# Patient Record
Sex: Female | Born: 1960 | Race: White | Hispanic: No | Marital: Married | Smoking: Never smoker
Health system: Southern US, Community
[De-identification: ages and names within clinical notes are randomized; demographics above are authoritative.]

## PROBLEM LIST (undated history)

## (undated) DIAGNOSIS — E875 Hyperkalemia: Secondary | ICD-10-CM

## (undated) DIAGNOSIS — H269 Unspecified cataract: Secondary | ICD-10-CM

## (undated) DIAGNOSIS — R519 Headache, unspecified: Secondary | ICD-10-CM

## (undated) DIAGNOSIS — E785 Hyperlipidemia, unspecified: Secondary | ICD-10-CM

## (undated) DIAGNOSIS — M7500 Adhesive capsulitis of unspecified shoulder: Secondary | ICD-10-CM

## (undated) DIAGNOSIS — G629 Polyneuropathy, unspecified: Secondary | ICD-10-CM

## (undated) DIAGNOSIS — N183 Chronic kidney disease, stage 3 unspecified: Secondary | ICD-10-CM

## (undated) DIAGNOSIS — G4730 Sleep apnea, unspecified: Secondary | ICD-10-CM

## (undated) DIAGNOSIS — E11319 Type 2 diabetes mellitus with unspecified diabetic retinopathy without macular edema: Secondary | ICD-10-CM

## (undated) DIAGNOSIS — M7502 Adhesive capsulitis of left shoulder: Secondary | ICD-10-CM

## (undated) DIAGNOSIS — S0500XA Injury of conjunctiva and corneal abrasion without foreign body, unspecified eye, initial encounter: Secondary | ICD-10-CM

## (undated) DIAGNOSIS — N189 Chronic kidney disease, unspecified: Secondary | ICD-10-CM

## (undated) DIAGNOSIS — G250 Essential tremor: Secondary | ICD-10-CM

## (undated) DIAGNOSIS — M21371 Foot drop, right foot: Secondary | ICD-10-CM

## (undated) DIAGNOSIS — G709 Myoneural disorder, unspecified: Secondary | ICD-10-CM

## (undated) DIAGNOSIS — M21372 Foot drop, left foot: Secondary | ICD-10-CM

## (undated) DIAGNOSIS — K3184 Gastroparesis: Secondary | ICD-10-CM

## (undated) DIAGNOSIS — F329 Major depressive disorder, single episode, unspecified: Secondary | ICD-10-CM

## (undated) DIAGNOSIS — E079 Disorder of thyroid, unspecified: Secondary | ICD-10-CM

## (undated) DIAGNOSIS — M7501 Adhesive capsulitis of right shoulder: Secondary | ICD-10-CM

## (undated) DIAGNOSIS — E109 Type 1 diabetes mellitus without complications: Secondary | ICD-10-CM

## (undated) DIAGNOSIS — F32A Depression, unspecified: Secondary | ICD-10-CM

## (undated) DIAGNOSIS — M26609 Unspecified temporomandibular joint disorder, unspecified side: Secondary | ICD-10-CM

## (undated) DIAGNOSIS — R58 Hemorrhage, not elsewhere classified: Secondary | ICD-10-CM

## (undated) DIAGNOSIS — J189 Pneumonia, unspecified organism: Secondary | ICD-10-CM

## (undated) DIAGNOSIS — D649 Anemia, unspecified: Secondary | ICD-10-CM

## (undated) DIAGNOSIS — R0982 Postnasal drip: Secondary | ICD-10-CM

## (undated) DIAGNOSIS — R51 Headache: Secondary | ICD-10-CM

## (undated) DIAGNOSIS — E1142 Type 2 diabetes mellitus with diabetic polyneuropathy: Secondary | ICD-10-CM

## (undated) DIAGNOSIS — E119 Type 2 diabetes mellitus without complications: Secondary | ICD-10-CM

## (undated) DIAGNOSIS — K219 Gastro-esophageal reflux disease without esophagitis: Secondary | ICD-10-CM

## (undated) DIAGNOSIS — E7800 Pure hypercholesterolemia, unspecified: Secondary | ICD-10-CM

## (undated) DIAGNOSIS — S92902A Unspecified fracture of left foot, initial encounter for closed fracture: Secondary | ICD-10-CM

## (undated) HISTORY — DX: Foot drop, left foot: M21.372

## (undated) HISTORY — DX: Unspecified cataract: H26.9

## (undated) HISTORY — DX: Anemia, unspecified: D64.9

## (undated) HISTORY — DX: Gastroparesis: K31.84

## (undated) HISTORY — DX: Type 2 diabetes mellitus with unspecified diabetic retinopathy without macular edema: E11.319

## (undated) HISTORY — PX: APPENDECTOMY: SHX54

## (undated) HISTORY — DX: Major depressive disorder, single episode, unspecified: F32.9

## (undated) HISTORY — DX: Hemorrhage, not elsewhere classified: R58

## (undated) HISTORY — DX: Unspecified fracture of left foot, initial encounter for closed fracture: S92.902A

## (undated) HISTORY — DX: Sleep apnea, unspecified: G47.30

## (undated) HISTORY — DX: Foot drop, right foot: M21.371

## (undated) HISTORY — DX: Depression, unspecified: F32.A

## (undated) HISTORY — DX: Type 2 diabetes mellitus with diabetic polyneuropathy: E11.42

## (undated) HISTORY — DX: Type 2 diabetes mellitus without complications: E11.9

## (undated) HISTORY — DX: Adhesive capsulitis of unspecified shoulder: M75.00

## (undated) HISTORY — DX: Hyperlipidemia, unspecified: E78.5

## (undated) HISTORY — DX: Hyperkalemia: E87.5

## (undated) HISTORY — DX: Adhesive capsulitis of right shoulder: M75.01

## (undated) HISTORY — DX: Gastro-esophageal reflux disease without esophagitis: K21.9

## (undated) HISTORY — DX: Disorder of thyroid, unspecified: E07.9

## (undated) HISTORY — DX: Postnasal drip: R09.82

## (undated) HISTORY — DX: Chronic kidney disease, stage 3 (moderate): N18.3

## (undated) HISTORY — DX: Injury of conjunctiva and corneal abrasion without foreign body, unspecified eye, initial encounter: S05.00XA

## (undated) HISTORY — DX: Unspecified temporomandibular joint disorder, unspecified side: M26.609

## (undated) HISTORY — DX: Chronic kidney disease, stage 3 unspecified: N18.30

## (undated) HISTORY — DX: Essential tremor: G25.0

## (undated) HISTORY — DX: Adhesive capsulitis of left shoulder: M75.02

## (undated) HISTORY — DX: Pure hypercholesterolemia, unspecified: E78.00

## (undated) HISTORY — DX: Polyneuropathy, unspecified: G62.9

## (undated) HISTORY — DX: Type 1 diabetes mellitus without complications: E10.9

## (undated) HISTORY — DX: Chronic kidney disease, unspecified: N18.9

---
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
## 1988-11-29 HISTORY — PX: CARPAL TUNNEL RELEASE: SHX101

## 2005-11-29 DIAGNOSIS — M21371 Foot drop, right foot: Secondary | ICD-10-CM

## 2005-11-29 HISTORY — DX: Foot drop, right foot: M21.371

## 2005-11-29 HISTORY — PX: OTHER SURGICAL HISTORY: SHX169

## 2005-11-29 HISTORY — PX: ROOT CANAL: SHX2363

## 2005-11-29 HISTORY — PX: ABDOMINAL HYSTERECTOMY: SHX81

## 2005-11-29 HISTORY — PX: COLONOSCOPY: SHX174

## 2006-11-29 HISTORY — PX: OTHER SURGICAL HISTORY: SHX169

## 2006-11-29 HISTORY — PX: CATARACT EXTRACTION: SUR2

## 2007-11-30 DIAGNOSIS — K3184 Gastroparesis: Secondary | ICD-10-CM

## 2007-11-30 HISTORY — DX: Gastroparesis: K31.84

## 2008-11-29 DIAGNOSIS — R0982 Postnasal drip: Secondary | ICD-10-CM

## 2008-11-29 DIAGNOSIS — E7800 Pure hypercholesterolemia, unspecified: Secondary | ICD-10-CM

## 2008-11-29 DIAGNOSIS — E11319 Type 2 diabetes mellitus with unspecified diabetic retinopathy without macular edema: Secondary | ICD-10-CM

## 2008-11-29 HISTORY — DX: Postnasal drip: R09.82

## 2008-11-29 HISTORY — PX: ROOT CANAL: SHX2363

## 2008-11-29 HISTORY — DX: Pure hypercholesterolemia, unspecified: E78.00

## 2008-11-29 HISTORY — DX: Type 2 diabetes mellitus with unspecified diabetic retinopathy without macular edema: E11.319

## 2009-11-29 DIAGNOSIS — R58 Hemorrhage, not elsewhere classified: Secondary | ICD-10-CM

## 2009-11-29 DIAGNOSIS — G250 Essential tremor: Secondary | ICD-10-CM

## 2009-11-29 HISTORY — DX: Essential tremor: G25.0

## 2009-11-29 HISTORY — DX: Hemorrhage, not elsewhere classified: R58

## 2010-11-29 DIAGNOSIS — E875 Hyperkalemia: Secondary | ICD-10-CM

## 2010-11-29 HISTORY — DX: Hyperkalemia: E87.5

## 2010-11-29 HISTORY — PX: EYE SURGERY: SHX253

## 2011-11-30 DIAGNOSIS — M7500 Adhesive capsulitis of unspecified shoulder: Secondary | ICD-10-CM

## 2011-11-30 DIAGNOSIS — M7501 Adhesive capsulitis of right shoulder: Secondary | ICD-10-CM

## 2011-11-30 DIAGNOSIS — S0500XA Injury of conjunctiva and corneal abrasion without foreign body, unspecified eye, initial encounter: Secondary | ICD-10-CM

## 2011-11-30 HISTORY — DX: Adhesive capsulitis of unspecified shoulder: M75.00

## 2011-11-30 HISTORY — DX: Adhesive capsulitis of right shoulder: M75.01

## 2011-11-30 HISTORY — DX: Injury of conjunctiva and corneal abrasion without foreign body, unspecified eye, initial encounter: S05.00XA

## 2014-07-15 ENCOUNTER — Encounter: Payer: Managed Care, Other (non HMO) | Attending: Internal Medicine | Admitting: *Deleted

## 2014-07-15 DIAGNOSIS — Z794 Long term (current) use of insulin: Secondary | ICD-10-CM | POA: Insufficient documentation

## 2014-07-15 DIAGNOSIS — E1065 Type 1 diabetes mellitus with hyperglycemia: Secondary | ICD-10-CM | POA: Insufficient documentation

## 2014-07-15 DIAGNOSIS — IMO0002 Reserved for concepts with insufficient information to code with codable children: Secondary | ICD-10-CM | POA: Diagnosis present

## 2014-07-15 DIAGNOSIS — Z4681 Encounter for fitting and adjustment of insulin pump: Secondary | ICD-10-CM | POA: Diagnosis present

## 2014-07-15 NOTE — Progress Notes (Signed)
Introduction to Insulin Pump Therapy:  Appt start time: 1530 end time:  1700 on 07/15/2014  Assessment:  This patient has DM 1 and their primary concerns today: interested in learning more about pump therapy and Carb Counting review.  This patient is interested in learning more about insulin pump therapy because she wants to improve her control of her DM 1. She is currently already wearing the Dexcom CGM and states she likes it very much.  MEDICATIONS: Basal Insulin: 24 units of Lantus at AM and again in PM via pen  Bolus Insulin: variable units of Humalog at each meal  via pen  Total of insulin doses per day: 90 units Other diabetes medications: no  This patient is  currently adjusting bolus insulin based BG at a correction ratio of 1 unit / 25 mg/dl above 784150 mg/dl This patient is not currently adjusting bolus insulin based on carb intake   Patient states knowledge of Carb Counting is limited  Usual physical activity: none due to disability due to neuropathy, in wheel chair today  Last A1c was 8.5%  Patient states complications from diabetes include neuropathy, gastroparesis, retinopathy and renal failure Patient states their biggest barrier with diabetes is keeping up with everything  Patient currently is not working, she is disabled.  Progress Towards Obtaining an Insulin PumpGoal(s):  In progress.  Patient states their expectations of pump therapy include: better control and easier to manage insulin doses Patient expresses understanding that for improved outcomes for their diabetes on an insulin pump they will:  Check BG 4+ times per day  Change out pump infusion set at least every 3 days  Upload pump information to software on a regular basis so provider can assess patterns and make setting adjustments.     Intervention:    Taught difference between delivery of insulin via syringe/pen compared to insulin pump.  Demonstrated improved insulin delivery via pump due to  improved accuracy of dose and flexibility of adjusting bolus insulin based on carb intake and BG correction.  Demonstrated Animas, Medtronic and T-Slim insulin pumps, insulin reservoir and infusion set options, and button pushing for bolus delivery of insulin through the pump  Explained importance of testing BG at least 4 times per day for appropriate correction of high BG and prevention of DKA as applicable.  Emphasized importance of follow up after Pump Start for appropriate pump setting adjustments and on-going training on more advanced features.  Handouts given during visit include: Patient interested in [place] because there is a partnership between [place] and [facility] already  Insulin Pump Packet from [facility]   Monitoring/Evaluation:    Patient does  want to continue with pursuit of insulin pump.  Patient instructed to go to [facility] to complete learning module on insulin pump and bring Certificate of Completion to next visit  Patient gave me permission to contact [place] Rep to start process of ordering the pump.  Patient to notify this office when pump is ordered so training time can be scheduled.

## 2014-07-20 ENCOUNTER — Encounter: Payer: Self-pay | Admitting: *Deleted

## 2014-09-27 ENCOUNTER — Other Ambulatory Visit: Payer: Self-pay

## 2014-09-27 DIAGNOSIS — Z1231 Encounter for screening mammogram for malignant neoplasm of breast: Secondary | ICD-10-CM

## 2014-10-14 ENCOUNTER — Ambulatory Visit
Admission: RE | Admit: 2014-10-14 | Discharge: 2014-10-14 | Disposition: A | Discharge status: Home / Self Care | Payer: 59 | Source: Ambulatory Visit

## 2014-10-14 DIAGNOSIS — Z1231 Encounter for screening mammogram for malignant neoplasm of breast: Secondary | ICD-10-CM

## 2014-12-04 ENCOUNTER — Encounter: Payer: 59 | Attending: Family Medicine | Admitting: *Deleted

## 2014-12-04 DIAGNOSIS — E1065 Type 1 diabetes mellitus with hyperglycemia: Secondary | ICD-10-CM

## 2014-12-04 DIAGNOSIS — IMO0002 Reserved for concepts with insufficient information to code with codable children: Secondary | ICD-10-CM

## 2014-12-04 DIAGNOSIS — E108 Type 1 diabetes mellitus with unspecified complications: Principal | ICD-10-CM

## 2014-12-05 NOTE — Progress Notes (Signed)
Insulin Pump Start Progress Note:  Patient appointment start time: 1430  End time 1730  Patient here for insulin pump start on Animas Vibe pump and Inset infusion set Orders with pump settings received from MD Patient did not complet any Pre- training  Patient is here with her husband, who was supportive and attentive during the visit. He did complete the pre-training materials prior to the visit.  Reviewed Pump Set Up including  Menu Settings  Bolus with Carb Ratio of 1 unit / 7 grams Carb, Correction Factor of 1 unit / 25 mg/dl  Suspend  Basal with initial Basal Rate of 1.40 units/hour  Reservoir Set Up   Pump Training Checklist completed Used Temp Basal of 6 duration @ 50 % basal due to patient taking half of their long acting insulin yesterday at 9 PM  Patient is to sign up for [employer] and agrees to upload by Friday, 12/06/14 and to provide me with their username and password so I can review progress and allow for pump setting adjustments  Patient successfully completed pump start and instructed to call me if BG drops below 60 mg/dl or goes above 161300 mg/dl or as directed by MD  Follow up plan:   Patient to use pump to bolus for meals and snacks   Patient's husband agrees to upload pump to [employer] and notify me of the Username and Password so I can obtain the reports and make decisions on pump settings.   Follow up appointment in 2 weeks to assess progress and teach advanced features such as Temp Basal and Extended Bolus. Will also plan to review Carb Counting again to reinforce.   Will instruct on Sick Day Rules and prevention of DKA at next visit too.

## 2014-12-12 ENCOUNTER — Encounter: Payer: 59 | Admitting: *Deleted

## 2014-12-12 DIAGNOSIS — E108 Type 1 diabetes mellitus with unspecified complications: Principal | ICD-10-CM

## 2014-12-12 DIAGNOSIS — IMO0002 Reserved for concepts with insufficient information to code with codable children: Secondary | ICD-10-CM

## 2014-12-12 DIAGNOSIS — E1065 Type 1 diabetes mellitus with hyperglycemia: Secondary | ICD-10-CM

## 2014-12-12 NOTE — Progress Notes (Signed)
  Pump Follow Up Progress Note 12/12/14  Orders received from MD giving me permission to make insulin pump adjustments for this patient.  Reviewed Dexcom sensor glucose logs on 12/12/14 via: Dexcom [employer] and found the following:           Hypoglycemia Hyperglycemia Comments  Overnight Period:   YES * see Comments below  Pre-Meal:    Breakfast  YES    Lunch  YES    Supper  YES   Post-Meal: Breakfast  YES    Lunch  YES    Supper  YES   Bedtime:   YES    Comments: Unable to upload pump today, patient's husband has not set up Web Site account yet either, so no pump reports available at this visit.  So I uploaded her Dexcom and reviewed those reports separately. All sensor glucose values were significantly above target with an average of 286 +/- 100 mg/dl.  I reviewed Animas Pump history on the pump and there were no boluses given since starting on the pump on 12/04/14. Patient thought she was completing the bolus but evidently she did not re-enter the total insulin value recommended so she was confirming the default of 0.00 units.  I asked patient and her husband why they did not contact me for any BG above 300 as directed and they stated they thought we would discuss the settings today and they were not clear which BG to call about.   Intervention: I instructed patient how to complete the bolus by inputting the total insulin recommended by the pump and confirming by pushing GO I explained that when BG is above 250 mg/dl there will be a High BG Alert, and that she should continue to next screen to complete the request for correction insulin to be delivered. I clarified that they should notify me by my cell phone if BG is below 70 or above 300 mg/dl in the future. I asked patient to give a correction bolus at every meal and only at snacks if BG is above 250 mg/dl to allow me to assess BG patterns more effectively. I discussed the rationale of obtaining Ketone Strips and suggested Foil Wrapped that  can be carried in her meter case and do not expire as quickly  Pump Settings: Target: 100 mg/dl Date: Current Date: 4/09/811/14/16  No changes made today   Basal Rate: Carb Ratio Sensitivity  Basal Rate: Carb Ratio Sensitivity   MN: 1.40 7 25 MN: 1.40 7 25                                                         Plan: Give insulin as a bolus for food and correction at each meal Give insulin as a bolus between meals if BG is above 250 mg/dl Look into getting Foil Wrapped Ketone Strips that you can carry in your meter case Call me if BG is below 70 or above 300 mg/dl    Follow up: Upload pump to web site within the next 7 days and notify me by text the username and password. Plan follow up visit in 1 month

## 2014-12-12 NOTE — Patient Instructions (Signed)
Plan: Give insulin as a bolus for food and correction at each meal Give insulin as a bolus between meals if BG is above 250 mg/dl Look into getting Foil Wrapped Ketone Strips that you can carry in your meter case Call me if BG is below 70 or above 300 mg/dl

## 2015-01-08 ENCOUNTER — Encounter: Payer: 59 | Attending: Family Medicine | Admitting: *Deleted

## 2015-01-08 DIAGNOSIS — Z713 Dietary counseling and surveillance: Secondary | ICD-10-CM | POA: Diagnosis not present

## 2015-01-08 DIAGNOSIS — E108 Type 1 diabetes mellitus with unspecified complications: Secondary | ICD-10-CM | POA: Diagnosis present

## 2015-01-08 DIAGNOSIS — E1065 Type 1 diabetes mellitus with hyperglycemia: Secondary | ICD-10-CM | POA: Diagnosis not present

## 2015-01-08 DIAGNOSIS — IMO0002 Reserved for concepts with insufficient information to code with codable children: Secondary | ICD-10-CM

## 2015-01-08 DIAGNOSIS — Z794 Long term (current) use of insulin: Secondary | ICD-10-CM | POA: Diagnosis not present

## 2015-01-08 NOTE — Progress Notes (Signed)
  Pump Follow Up Progress Note 01/08/15   Start Time: 1600 End time: 1700  Orders received from MD giving me permission to make insulin pump adjustments for this patient.  Reviewed Animas glucose logs on 01/08/15 via: [employer] and found the following:                   Hypoglycemia Hyperglycemia Comments  Overnight Period:      Pre-Meal:    Breakfast      Lunch      Supper     Post-Meal: Breakfast      Lunch      Supper  YES ICR  Bedtime:   YES ICR   Comments: Average BG for past 2 weeks: 193 +/- 67 mg/dl. She is reporting no hypoglycemia and more stability with her BG's than when on injections. There is hyperglycemia around midnight, after her supper meal, so I plan to increase the ICR at supper time, see below. She enjoys no more shots, and states her pump is easy to use. I answered her questions regarding timing of changing out the pump if the reservoir is not empty. Suggested they notify me when they upload in the future and need assistance with further pump setting adjustments.  Intervention: Increased Carb Ratio at 5 PM per below  Pump Settings: Target: 100 mg/dl Date: Current Date: 1/61/092/10/16   changes in bold print   Basal Rate: Carb Ratio Sensitivity  Basal Rate: Carb Ratio Sensitivity   MN: 1.70 7 25 MN: 1.70 MN: 7 25  8  A: 1.10   8 A: 1.10 5 P: 6 (+)   8 P: 1.70   8 P: 1.70                                         Plan: Give insulin as a bolus for food and correction at each meal Give insulin as a bolus between meals if BG is above 250 mg/dl Look into getting Foil Wrapped Ketone Strips that you can carry in your meter case Call me if BG is below 70 or above 300 mg/dl    Follow up: Plan follow up visit with me PRN Continue to see Dr. Sharl MaKerr every 3 months

## 2015-02-19 ENCOUNTER — Encounter: Payer: Self-pay | Admitting: Neurology

## 2015-02-19 ENCOUNTER — Ambulatory Visit (INDEPENDENT_AMBULATORY_CARE_PROVIDER_SITE_OTHER): Payer: 59 | Admitting: Neurology

## 2015-02-19 VITALS — BP 90/60 | HR 67 | Temp 97.7°F | Ht 64.0 in

## 2015-02-19 DIAGNOSIS — E1143 Type 2 diabetes mellitus with diabetic autonomic (poly)neuropathy: Secondary | ICD-10-CM

## 2015-02-19 DIAGNOSIS — E785 Hyperlipidemia, unspecified: Secondary | ICD-10-CM | POA: Diagnosis not present

## 2015-02-19 DIAGNOSIS — E108 Type 1 diabetes mellitus with unspecified complications: Secondary | ICD-10-CM | POA: Diagnosis not present

## 2015-02-19 DIAGNOSIS — E13319 Other specified diabetes mellitus with unspecified diabetic retinopathy without macular edema: Secondary | ICD-10-CM | POA: Diagnosis not present

## 2015-02-19 DIAGNOSIS — I951 Orthostatic hypotension: Secondary | ICD-10-CM | POA: Diagnosis not present

## 2015-02-19 DIAGNOSIS — M21372 Foot drop, left foot: Secondary | ICD-10-CM

## 2015-02-19 DIAGNOSIS — E1042 Type 1 diabetes mellitus with diabetic polyneuropathy: Secondary | ICD-10-CM

## 2015-02-19 DIAGNOSIS — F329 Major depressive disorder, single episode, unspecified: Secondary | ICD-10-CM | POA: Diagnosis not present

## 2015-02-19 DIAGNOSIS — F32A Depression, unspecified: Secondary | ICD-10-CM

## 2015-02-19 DIAGNOSIS — G250 Essential tremor: Secondary | ICD-10-CM | POA: Diagnosis not present

## 2015-02-19 DIAGNOSIS — G4733 Obstructive sleep apnea (adult) (pediatric): Secondary | ICD-10-CM | POA: Diagnosis not present

## 2015-02-19 DIAGNOSIS — E038 Other specified hypothyroidism: Secondary | ICD-10-CM | POA: Diagnosis not present

## 2015-02-19 DIAGNOSIS — K3184 Gastroparesis: Secondary | ICD-10-CM

## 2015-02-19 DIAGNOSIS — N189 Chronic kidney disease, unspecified: Secondary | ICD-10-CM | POA: Diagnosis not present

## 2015-02-19 DIAGNOSIS — M21371 Foot drop, right foot: Secondary | ICD-10-CM | POA: Diagnosis not present

## 2015-02-19 NOTE — Progress Notes (Signed)
[facility]    Provider:  Dr Lucia Gaskins Referring Provider: Juluis Rainier, MD Primary Care Physician:  Gaye Alken, MD  CC:  Tremor  HPI:  Masae Lukacs is a 54 y.o. female here as a referral from Dr. Zachery Dauer for tremor. She has very complicated PMHx. DM 1 with complications of retinopathy and neuropathy and chronic renal insufficiency and  gastroparesis and frozen shoulder, DKA, orthostatic hypotension,  with insulin pump, neuropathy, bilateral foot drop, thyroid disease, HLD, asthma, depression, MGUS, essential tremor. Sleep apnea does not use cpap  She has had tremors. For 5 years. It is worsening. In the hands. Sometimes they are so bad, husband has to hold her hands and she can't stop them even her whole arm shakes. Progressively, slowly getting worse. No caffeine. No coffee. Primidone helped in the past. Doesn't notice it in the mouth or legs. Has worsening neuropathy and was being followed by a neurologist who did a full evaluation. Does not drink alcohol. tremor interferes with eating and cutting food. Had MRi of the brain a few years ago which was negative. She has worsening memory problems. No FHx neuropathy or neurmuscular disease.   Review of Systems: Patient complains of symptoms per HPI as well as the following symptoms: fatigue, cough, snoring, memory loss, headache, numbness, difficulty swallowing, dizziness, tremor, sleepiness, snoring, depression, toomuch sleep, allergies, diarrhea. Pertinent negatives per HPI. All others negative.   History   Social History  . Marital Status: Married    Spouse Name: Theron Arista  . Number of Children: 2  . Years of Education: Ba   Occupational History  . Not on file.   Social History Main Topics  . Smoking status: Never Smoker   . Smokeless tobacco: Not on file  . Alcohol Use: No  . Drug Use: No  . Sexual Activity: Not on file   Other Topics Concern  . Not on file   Social History Narrative   Lives at  home with husband and daughter.   Caffeine use: 1 soda per day    Family History  Problem Relation Age of Onset  . Parkinsonism Neg Hx     Past Medical History  Diagnosis Date  . Diabetes mellitus without complication   . Neuropathy   . Depression   . Cataract   . Foot drop, bilateral 2007  . Thyroid disease   . Sleep apnea   . Anemia   . Gastroparesis 2009  . Chronic kidney disease   . GERD (gastroesophageal reflux disease)   . TMJ (temporomandibular joint disorder)   . Hyperlipidemia   . Diabetic retinopathy 2010  . Adhesive capsulitis of both shoulders 2013  . Postnasal drip 2010  . Hypercholesterolemia 2010  . Diabetic retinopathy 2010    Left eye, mild  . Essential tremor 2011    Bilateral hands  . Hemorrhage 2011    Laser surgery x2  . High potassium 2012    Normal as of 2014  . Scratched cornea 2013    Left eye  . Adhesive capsulitis 2013    Bilateral shoulders    Past Surgical History  Procedure Laterality Date  . Eye surgery Left 2012    Pars plana viterctomy, membrane peel, endolaser  . Colonoscopy  2007  . Throat dilation  2007  . Root canal  2007  . Thorat dilation  2007  . Root canal  2010  . Throat dilation  2010, 0ct 2011    Sep 2010, Nov 2010, 2014  . Tonsillectomy  1971  . Wisdom tooth extraction  1977  . Carpal tunnel release Right 1990  . Abdominal hysterectomy  2007  . Trigger fingers  2008  . Cataract extraction Bilateral 2008    Current Outpatient Prescriptions  Medication Sig Dispense Refill  . Choline Fenofibrate (FENOFIBRIC ACID) 45 MG CPDR Take 45 mg by mouth 3 (three) times daily.    . cycloSPORINE (RESTASIS) 0.05 % ophthalmic emulsion 1 drop 2 (two) times daily.    Marland Kitchen escitalopram (LEXAPRO) 10 MG tablet Take 10 mg by mouth daily.    Marland Kitchen gabapentin (NEURONTIN) 300 MG capsule Take 300 mg by mouth 3 (three) times daily.    . insulin lispro (HUMALOG) 100 UNIT/ML injection Inject into the skin 3 (three) times daily before meals.     . lamoTRIgine (LAMICTAL) 25 MG tablet Take 25 mg by mouth daily.    . lansoprazole (PREVACID) 30 MG capsule Take 30 mg by mouth 2 (two) times daily before a meal.    . levocetirizine (XYZAL) 5 MG tablet Take 5 mg by mouth 2 (two) times daily.    Marland Kitchen levothyroxine (SYNTHROID, LEVOTHROID) 150 MCG tablet Take 150 mcg by mouth daily before breakfast.    . mometasone (NASONEX) 50 MCG/ACT nasal spray Place 2 sprays into the nose daily.    . montelukast (SINGULAIR) 10 MG tablet Take 10 mg by mouth at bedtime.    Letta Pate DELICA LANCETS 33G MISC     . ONETOUCH VERIO test strip     . rosuvastatin (CRESTOR) 20 MG tablet Take 20 mg by mouth daily.    Marland Kitchen estradiol (VIVELLE-DOT) 0.05 MG/24HR patch Place 1 patch onto the skin 2 (two) times a week.    . insulin glargine (LANTUS) 100 UNIT/ML injection Inject 24 Units into the skin 2 (two) times daily.    . primidone (MYSOLINE) 50 MG tablet Take 1.5 tablets (75 mg total) by mouth 3 (three) times daily. 135 tablet 6   No current facility-administered medications for this visit.    Allergies as of 02/19/2015 - Review Complete 02/19/2015  Allergen Reaction Noted  . Codeine  02/19/2015  . Food  07/15/2014  . Penicillins  02/19/2015    Vitals: BP 90/60 mmHg  Pulse 67  Temp(Src) 97.7 F (36.5 C)  Ht  (1.626 m) Last Weight:  Wt Readings from Last 1 Encounters:  No data found for Wt   Last Height:   Ht Readings from Last 1 Encounters:  02/19/15  (1.626 m)   Attempted to document weight. Patient could not stand independently and unknown wheelchair weight.  Physical exam: Exam: Gen: NAD, conversant, well nourised, overweight, well groomed                     CV: RRR, no MRG. No Carotid Bruits. No peripheral edema, warm, nontender Eyes: Conjunctivae clear without exudates or hemorrhage  Neuro: Detailed Neurologic Exam  Speech:    Speech is normal; fluent and spontaneous with normal comprehension.  Cognition:    The patient is  oriented to person, place, and time;     recent and remote memory impaired;     language fluent;     normal attention, concentration,     fund of knowledge grossly intact Cranial Nerves:    The pupils are equal, round, and reactive to light. The fundi are normal and spontaneous venous pulsations are present. Visual fields are full to finger confrontation. Extraocular movements are intact. Trigeminal sensation is intact and  the muscles of mastication are normal. The face is symmetric. The palate elevates in the midline. Hearing intact. Voice is normal. Shoulder shrug is normal. The tongue has normal motion without fasciculations.   Coordination:    FTN without dysmetria. Can't do HTS secondary to weakness.  Gait:    Attempted, able to do briefly. patient with wide-based gait  Motor Observation:    Postural and intention tremor Tone:    Normal muscle tone.    Posture:    Posture is normal. normal erect    Strength: Weakness throughtout 3/5 right arm, left 4+/5 2+/5 lowers with inability to DF feet     Sensation: intact to LT     Reflex Exam:  DTR's:    Absent achilles Toes:    The toes are downgoing bilaterally.   Clonus:    Clonus is absent.       Assessment/Plan:    Roxana HiresMary Hedges is a 54 y.o. female here as a referral from Dr. Zachery DauerBarnes for tremor. She has very complicated PMHx. DM 1 with complications of retinopathy and neuropathy and chronic renal insufficiency and  gastroparesis and frozen shoulder, DKA, orthostatic hypotension,  with insulin pump, neuropathy with bilateral foot drop, thyroid disease, HLD, asthma, depression, MGUS, essential tremor. p/w worsening essential tremor relieved by Primidone in the past. On 50mg  tid for many years.  - Will request lab records from primary care, records requested - Can increase primidone if renal f(x) appropriate - for sleep apnea, encouraged f/u and compliance with cpap. Can cause h/a,cognitive changes, increased risk of  stroke   Addendum: Received records. Creatinine 1.35 patient approx 180 lbs so cr clearance appropriate to increase primidone up to tid. Can increase to 75mg  tid. Called and spoke to patient the next day after received labs and called in prescription.    Naomie DeanAntonia Ahern, MD  [facility] [address] [place], [place] [id number]  Phone [phone] Fax [phone]

## 2015-02-19 NOTE — Patient Instructions (Signed)
Overall you are doing fairly well but I do want to suggest a few things today:   Remember to drink plenty of fluid, eat healthy meals and do not skip any meals. Try to eat protein with a every meal and eat a healthy snack such as fruit or nuts in between meals. Try to keep a regular sleep-wake schedule and try to exercise daily, particularly in the form of walking, 20-30 minutes a day, if you can.   As far as your medications are concerned, I would like to suggest: Suggest increasing the primidone  Will Contact Dr. Louis MeckelStovall I would like to see you back in 6 months, sooner if we need to. Please call us with any interim questions, concerns, problems, updates or refill requests.   Please also call us for any test results so we can go over those with you on the phone.  My clinical assistant and will answer any of your questions and relay your messages to me and also relay most of my messages to you.   Our phone number is [phone]. We also have an after hours call service for urgent matters and there is a physician on-call for urgent questions. For any emergencies you know to call 911 or go to the nearest emergency room

## 2015-02-20 ENCOUNTER — Telehealth: Payer: Self-pay | Admitting: Neurology

## 2015-02-20 ENCOUNTER — Telehealth: Payer: Self-pay | Admitting: *Deleted

## 2015-02-20 MED ORDER — PRIMIDONE 50 MG PO TABS: 75.0000 mg | ORAL_TABLET | Freq: Three times a day (TID) | ORAL | Status: DC

## 2015-02-20 NOTE — Telephone Encounter (Signed)
[name] - Can you call patient back and let her know that it is ok to increase her Primidone 50mg  to 1.5 tablets three times daily. Discussed sedation with patient yesterday, stop for any concerning side effect such as nausea, fatigue, drowsiness, rash, irritability.

## 2015-02-20 NOTE — Telephone Encounter (Signed)
Lab receive on 02/20/15.

## 2015-02-21 ENCOUNTER — Telehealth: Payer: Self-pay | Admitting: *Deleted

## 2015-02-21 NOTE — Telephone Encounter (Signed)
Talked with pt and she will call me back with the fax number for her pharmacy.

## 2015-02-21 NOTE — Telephone Encounter (Signed)
Talked with pt and she was unable to get fax number to [facility] because they said they do not give that to pt and our office will have to call. I told her I will figure out whats going on.

## 2015-02-21 NOTE — Telephone Encounter (Signed)
Talked with pt and received pharmacy information. She uses [facility]. Gave me the number [phone].

## 2015-02-24 ENCOUNTER — Telehealth: Payer: Self-pay | Admitting: *Deleted

## 2015-02-24 NOTE — Telephone Encounter (Signed)
Faxed prescription for primidone 50mg  tablet to [facility] pharmacy on 02/24/15 at 10:08am.

## 2015-02-27 ENCOUNTER — Encounter: Payer: Self-pay | Admitting: Neurology

## 2015-03-01 ENCOUNTER — Encounter: Payer: Self-pay | Admitting: Neurology

## 2015-03-01 DIAGNOSIS — M21372 Foot drop, left foot: Secondary | ICD-10-CM | POA: Insufficient documentation

## 2015-03-01 DIAGNOSIS — E11319 Type 2 diabetes mellitus with unspecified diabetic retinopathy without macular edema: Secondary | ICD-10-CM | POA: Insufficient documentation

## 2015-03-01 DIAGNOSIS — E1140 Type 2 diabetes mellitus with diabetic neuropathy, unspecified: Secondary | ICD-10-CM | POA: Insufficient documentation

## 2015-03-01 DIAGNOSIS — E108 Type 1 diabetes mellitus with unspecified complications: Secondary | ICD-10-CM | POA: Insufficient documentation

## 2015-03-01 DIAGNOSIS — F32A Depression, unspecified: Secondary | ICD-10-CM | POA: Insufficient documentation

## 2015-03-01 DIAGNOSIS — K3184 Gastroparesis: Secondary | ICD-10-CM

## 2015-03-01 DIAGNOSIS — E1010 Type 1 diabetes mellitus with ketoacidosis without coma: Secondary | ICD-10-CM | POA: Insufficient documentation

## 2015-03-01 DIAGNOSIS — F329 Major depressive disorder, single episode, unspecified: Secondary | ICD-10-CM | POA: Insufficient documentation

## 2015-03-01 DIAGNOSIS — E785 Hyperlipidemia, unspecified: Secondary | ICD-10-CM | POA: Insufficient documentation

## 2015-03-01 DIAGNOSIS — N189 Chronic kidney disease, unspecified: Secondary | ICD-10-CM | POA: Insufficient documentation

## 2015-03-01 DIAGNOSIS — G4733 Obstructive sleep apnea (adult) (pediatric): Secondary | ICD-10-CM | POA: Insufficient documentation

## 2015-03-01 DIAGNOSIS — E039 Hypothyroidism, unspecified: Secondary | ICD-10-CM | POA: Insufficient documentation

## 2015-03-01 DIAGNOSIS — G250 Essential tremor: Secondary | ICD-10-CM | POA: Insufficient documentation

## 2015-03-01 DIAGNOSIS — M21371 Foot drop, right foot: Secondary | ICD-10-CM | POA: Insufficient documentation

## 2015-03-01 DIAGNOSIS — I951 Orthostatic hypotension: Secondary | ICD-10-CM | POA: Insufficient documentation

## 2015-03-01 DIAGNOSIS — E1143 Type 2 diabetes mellitus with diabetic autonomic (poly)neuropathy: Secondary | ICD-10-CM | POA: Insufficient documentation

## 2015-03-11 ENCOUNTER — Telehealth: Payer: Self-pay | Admitting: Neurology

## 2015-03-11 NOTE — Telephone Encounter (Signed)
Patient is calling in regard to Rx primidone 50 mg.  She is taking 1 1/2 tablets 3 times per day. She is asking if she can take 1 extra gabapentin 3 times per day and drop the dosage of primidone to 1 tablet 3 times per day. Please call.

## 2015-03-12 NOTE — Telephone Encounter (Signed)
Her med list states she is taking gabapentin 300mg  tid. If so, adding one tab tid is fine. She can go up to 1200mg  tid, thanks.

## 2015-03-12 NOTE — Telephone Encounter (Signed)
I called back.  Relayed providers message.  Patient verbalized understanding and is agreeable to this plan.  She will call us back if anything further is needed.  

## 2015-05-30 ENCOUNTER — Telehealth: Payer: Self-pay | Admitting: Neurology

## 2015-05-30 NOTE — Telephone Encounter (Signed)
Pt called and needs refill for lamoTRIgine (LAMICTAL) 25 MG tablet. Please call and advise [phone]. Also wants to know about a different medication for her tremors. [facility] phone number [phone],

## 2015-05-30 NOTE — Telephone Encounter (Signed)
I called back and spoke with the patient.  She would like to know if Dr Lucia GaskinsAhern would be agreeable to start prescribing Lamictal 150mg  one twice daily.  As well, says the increase in Gabapentin has helped her pain, but feels tremor is getting progressively worse.  She is taking Primidone 75mg  three times daily.  Patient would like to know if an additional mediation could be added for tremor.  Requests message be sent to Dr Lucia GaskinsAhern.  Patient is aware she is not available until next week, says she still has plenty of medication on hand at this time, and prefers to wait for her return.     Please advise.  Thank you!

## 2015-06-04 NOTE — Telephone Encounter (Signed)
Patient just returned Dr. Trevor MaceAhern's call. Please call back [phone].

## 2015-06-04 NOTE — Telephone Encounter (Signed)
Called patient, left message. Will try again.

## 2015-06-05 ENCOUNTER — Telehealth: Payer: Self-pay | Admitting: Neurology

## 2015-06-05 NOTE — Telephone Encounter (Signed)
I called back.  Patient says she would like a Rx for Lamictal 150mg  BID and also wants to discuss taking another medication in addition to Primidone for tremors.  Call back number is [phone].  Please advise.  Thank you!  (I will be happy to call her back)

## 2015-06-05 NOTE — Telephone Encounter (Signed)
Patient called requesting refill for lamoTRIgine (LAMICTAL) 25 MG tablet 30 day supply [facility]. She also needs to discuss a different medication for tremors. Please call and advise. Patient can be reached at [phone].

## 2015-06-06 ENCOUNTER — Other Ambulatory Visit: Payer: Self-pay | Admitting: Neurology

## 2015-06-06 DIAGNOSIS — G250 Essential tremor: Secondary | ICD-10-CM

## 2015-06-06 MED ORDER — LAMOTRIGINE 150 MG PO TABS: 150.0000 mg | ORAL_TABLET | Freq: Every day | ORAL | Status: DC

## 2015-06-06 MED ORDER — GABAPENTIN 300 MG PO CAPS: 900.0000 mg | ORAL_CAPSULE | Freq: Three times a day (TID) | ORAL | Status: DC

## 2015-06-06 NOTE — Telephone Encounter (Signed)
Done, thanks

## 2015-06-24 ENCOUNTER — Telehealth: Payer: Self-pay | Admitting: Neurology

## 2015-06-24 ENCOUNTER — Other Ambulatory Visit: Payer: Self-pay | Admitting: Neurology

## 2015-06-24 MED ORDER — LAMOTRIGINE 150 MG PO TABS: 150.0000 mg | ORAL_TABLET | Freq: Two times a day (BID) | ORAL | Status: DC

## 2015-06-24 NOTE — Telephone Encounter (Signed)
Pt wanted to know if Dr. Lucia Gaskins can fix her Rx for Lamictal. When she previously called she wanted her to call in Rx for 1 tablet 2 times per day. Instead it was sent in as 1 tablet, once daily. She would like a new Rx for 1 tablet, twice daily, so she can take one in the morning and one at night. I told her I will talk with Dr. Lucia Gaskins and call her back. Pt verbalized understanding.

## 2015-06-24 NOTE — Telephone Encounter (Signed)
Patient called inquiring about lamoTRIgine (LAMICTAL) 150 MG tablet . She has a question about the doseage. Please call and advise. Patient can be reached at [phone].

## 2015-06-24 NOTE — Telephone Encounter (Signed)
Let her know I sent it in. thanks

## 2015-06-25 NOTE — Telephone Encounter (Signed)
The patient called back and I advised her the Rx for lamoTRIgine (LAMICTAL) 150 MG tablet was sent. Patient states she did not need a returned call.

## 2015-06-25 NOTE — Telephone Encounter (Signed)
Left VM for pt to call back. Gave [facility] phone number and told her we are open until 5pm.  

## 2015-08-18 ENCOUNTER — Telehealth: Payer: Self-pay | Admitting: *Deleted

## 2015-08-18 NOTE — Telephone Encounter (Signed)
LVM for pt to call back to r/s appt for tomorrow d/t Dr. Lucia Gaskins having emergency and will not be in the office. Gave [facility] phone number and office hours. Advised that Dr. Lucia Gaskins and I are not in the office on Friday's.

## 2015-08-19 ENCOUNTER — Telehealth: Payer: Self-pay | Admitting: *Deleted

## 2015-08-19 ENCOUNTER — Ambulatory Visit: Payer: 59 | Admitting: Neurology

## 2015-08-19 NOTE — Telephone Encounter (Signed)
Spoke w/ [name] and rescheduled appt for today to tomorrow at 1:15pm. Told her to check in at 1:00pm. Told her Dr. Lucia Gaskins had an emergency today and is not in the office. Apologized for any inconvenience. [name] understanding and verbalized understanding.

## 2015-08-20 ENCOUNTER — Ambulatory Visit (INDEPENDENT_AMBULATORY_CARE_PROVIDER_SITE_OTHER): Payer: 59 | Admitting: Neurology

## 2015-08-20 VITALS — BP 99/64 | HR 58 | Temp 97.1°F | Ht 64.0 in

## 2015-08-20 DIAGNOSIS — R413 Other amnesia: Secondary | ICD-10-CM | POA: Diagnosis not present

## 2015-08-20 DIAGNOSIS — Z7409 Other reduced mobility: Secondary | ICD-10-CM

## 2015-08-20 DIAGNOSIS — G250 Essential tremor: Secondary | ICD-10-CM | POA: Diagnosis not present

## 2015-08-20 MED ORDER — LIDOCAINE 0.5 % EX GEL: 1.0000 "application " | CUTANEOUS | Status: DC

## 2015-08-20 NOTE — Progress Notes (Addendum)
[facility]    [name]: Dr Lucia Gaskins Referring Lowery Paullin: Juluis Rainier, MD Primary Care Physician: Gaye Alken, MD  CC: Tremor  Interval update; Tremor is improved by the primidone and the gabapentin. But the tremor is still impairing her ability to write and her ability to ability to text. She is on neurontin and the primidone. Recent increase in primidone didn't help. And the primidone is making her tired. Her blood pressure is always low and this is likely why she hasn't tried propranolol. She is having memory problems. She is having word-finding difficulties.   HPI: [name] is a 54 y.o. female here as a referral from Dr. Zachery Dauer for tremor. She has very complicated PMHx. DM 1 with complications of retinopathy and neuropathy and chronic renal insufficiency and gastroparesis and frozen shoulder, DKA, orthostatic hypotension, with insulin pump, neuropathy, bilateral foot drop, thyroid disease, HLD, asthma, depression, MGUS, essential tremor. Sleep apnea does not use cpap  She has had tremors. For 5 years. It is worsening. In the hands. Sometimes they are so bad, husband has to hold her hands and she can't stop them even her whole arm shakes. Progressively, slowly getting worse. No caffeine. No coffee. Primidone helped in the past. Doesn't notice it in the mouth or legs. Has worsening neuropathy and was being followed by a neurologist who did a full evaluation. Does not drink alcohol. tremor interferes with eating and cutting food. Had MRi of the brain a few years ago which was negative. She has worsening memory problems. No FHx neuropathy or neurmuscular disease.   Review of Systems: Patient complains of symptoms per HPI as well as the following symptoms: fatigue, cough, snoring, memory loss, headache, numbness, difficulty swallowing, dizziness, tremor, sleepiness, snoring, depression, toomuch sleep, allergies, diarrhea. Pertinent negatives per HPI. All  others negative.  Review of Systems: Patient complains of symptoms per HPI as well as the following symptoms: no CP. No SOB. Light sensitivity, cold intolerance, heat intolerance, food allergies, environmental allergies, apnea, snoring, walking difficulty, memory loss, dizziness, speech difficulty, tremors, depression, anxious Pertinent negatives per HPI. All others negative.   Social History   Social History  . Marital Status: Married    Spouse Name: Theron Arista  . Number of Children: 2  . Years of Education: Ba   Occupational History  . Not on file.   Social History Main Topics  . Smoking status: Never Smoker   . Smokeless tobacco: Not on file  . Alcohol Use: No  . Drug Use: No  . Sexual Activity: Not on file   Other Topics Concern  . Not on file   Social History Narrative   Lives at home with husband and daughter.   Caffeine use: 1 soda per day    Family History  Problem Relation Age of Onset  . Parkinsonism Neg Hx   . Neuropathy Neg Hx     Past Medical History  Diagnosis Date  . Diabetes mellitus without complication   . Neuropathy   . Depression   . Cataract   . Foot drop, bilateral 2007  . Thyroid disease   . Sleep apnea   . Anemia   . Gastroparesis 2009  . Chronic kidney disease   . GERD (gastroesophageal reflux disease)   . TMJ (temporomandibular joint disorder)   . Hyperlipidemia   . Diabetic retinopathy 2010  . Adhesive capsulitis of both shoulders 2013  . Postnasal drip 2010  . Hypercholesterolemia 2010  . Diabetic retinopathy 2010    Left eye, mild  .  Essential tremor 2011    Bilateral hands  . Hemorrhage 2011    Laser surgery x2  . High potassium 2012    Normal as of 2014  . Scratched cornea 2013    Left eye  . Adhesive capsulitis 2013    Bilateral shoulders    Past Surgical History  Procedure Laterality Date  . Eye surgery Left 2012    Pars plana viterctomy, membrane peel, endolaser  . Colonoscopy  2007  . Throat dilation  2007  .  Root canal  2007  . Thorat dilation  2007  . Root canal  2010  . Throat dilation  2010, 0ct 2011    Sep 2010, Nov 2010, 2014  . Tonsillectomy  1971  . Wisdom tooth extraction  1977  . Carpal tunnel release Right 1990  . Abdominal hysterectomy  2007  . Trigger fingers  2008  . Cataract extraction Bilateral 2008    Current Outpatient Prescriptions  Medication Sig Dispense Refill  . CREON 36000 UNITS CPEP capsule Take 36,000 Units by mouth daily. Take one huge capsule with each meal or snack    . rosuvastatin (CRESTOR) 20 MG tablet Take 20 mg by mouth daily.    . Choline Fenofibrate (FENOFIBRIC ACID) 45 MG CPDR Take 45 mg by mouth 3 (three) times daily.    . cycloSPORINE (RESTASIS) 0.05 % ophthalmic emulsion 1 drop 2 (two) times daily.    . DULERA 200-5 MCG/ACT AERO Take 1 puff by mouth 2 (two) times daily.    Marland Kitchen escitalopram (LEXAPRO) 10 MG tablet Take 10 mg by mouth daily.    Marland Kitchen estradiol (VIVELLE-DOT) 0.05 MG/24HR patch Place 1 patch onto the skin 2 (two) times a week.    . gabapentin (NEURONTIN) 300 MG capsule Take 3 capsules (900 mg total) by mouth 3 (three) times daily. 810 capsule 3  . insulin glargine (LANTUS) 100 UNIT/ML injection Inject 24 Units into the skin 2 (two) times daily.    . insulin lispro (HUMALOG) 100 UNIT/ML injection Inject into the skin 3 (three) times daily before meals.    . lamoTRIgine (LAMICTAL) 150 MG tablet Take 1 tablet (150 mg total) by mouth 2 (two) times daily. 180 tablet 3  . lansoprazole (PREVACID) 30 MG capsule Take 30 mg by mouth 2 (two) times daily before a meal.    . levocetirizine (XYZAL) 5 MG tablet Take 5 mg by mouth 2 (two) times daily.    Marland Kitchen levothyroxine (SYNTHROID, LEVOTHROID) 150 MCG tablet Take 150 mcg by mouth daily before breakfast.    . mometasone (NASONEX) 50 MCG/ACT nasal spray Place 2 sprays into the nose daily.    . montelukast (SINGULAIR) 10 MG tablet Take 10 mg by mouth at bedtime.    Letta Pate DELICA LANCETS 33G MISC     .  ONETOUCH VERIO test strip     . primidone (MYSOLINE) 50 MG tablet Take 1.5 tablets (75 mg total) by mouth 3 (three) times daily. 135 tablet 6   No current facility-administered medications for this visit.    Allergies as of 08/20/2015 - Review Complete 08/20/2015  Allergen Reaction Noted  . Codeine  02/19/2015  . Food  07/15/2014  . Penicillins  02/19/2015    Vitals: BP 99/64 mmHg  Pulse 58  Temp(Src) 97.1 F (36.2 C) (Oral)  Ht  (1.626 m) Last Weight:  Wt Readings from Last 1 Encounters:  No data found for Wt   Last Height:   Ht Readings from Last 1  Encounters:  08/20/15  (1.626 m)     Physical exam: Exam: Gen: NAD, conversant, well nourised, overweight, well groomed  CV: RRR, no MRG. No Carotid Bruits. No peripheral edema, warm, nontender Eyes: Conjunctivae clear without exudates or hemorrhage  Neuro: Detailed Neurologic Exam  Speech:  Speech is normal; fluent and spontaneous with normal comprehension.  Cognition:  The patient is oriented to person, place, and time; MMSE 28/30.     -1 counting backwards by 7s, -1 recall  Cranial Nerves:  The pupils are equal, round, and reactive to light.  Visual fields are full to finger confrontation. Extraocular movements are intact. Trigeminal sensation is intact and the muscles of mastication are normal. The face is symmetric. The palate elevates in the midline. Hearing intact. Voice is normal. Shoulder shrug is normal. The tongue has normal motion without fasciculations.   Coordination:  FTN without dysmetria. Can't do HTS secondary to weakness.  Gait:  Attempted, able to do briefly. patient with wide-based gait  Motor Observation:  Postural and intention tremor Tone:  Normal muscle tone.   Posture:  Posture is normal. normal erect   Strength: Weakness throughtout 3/5 right arm, left 4+/5 2+/5 lowers with inability to DF feet   Sensation: intact to LT    Reflex Exam:  DTR's:  Absent achilles Toes:  The toes are downgoing bilaterally.  Clonus:  Clonus is absent.      Assessment/Plan: [name] is a 54yo. female here as a referral from Dr. Zachery Dauer for tremor. She has very complicated PMHx. 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 p/w worsening essential tremor relieved by Primidone in the past. On  tid for many years. C/o memory changes today  - essential tremor: she is on primidone and neurontin. Her BP is low so can't try propranolol. Advised we can try different medications or add another but hate to add another due to polypharmacy. She is already having side effects from current meds. Will refer to OT and see if they can help with the tremor with functional items such as weighed silverware.  - perceived memory loss; MMSE normal. Likely secondary to polypharmacy, general medicaal conditions. Reported anxiety/depression - for sleep apnea, encouraged f/u and compliance with cpap. Can cause h/a,cognitive changes, increased risk of stroke   Addendum: Received records. Creatinine 1.35 patient approx 180 lbs so cr clearance appropriate to increase primidone up to tid. Can increase to  tid. Called and spoke to patient the next day after received labs and called in prescription.  Naomie Dean, MD  [facility] [address] [place], [place] [address]  Phone [phone] Fax [phone]  A total of 25 minutes was spent face-to-face with this patient. Over half this time was spent on counseling patient on the essential tremor diagnosis and different diagnostic and therapeutic options available.

## 2015-11-25 ENCOUNTER — Other Ambulatory Visit: Payer: Self-pay

## 2015-11-25 DIAGNOSIS — Z1231 Encounter for screening mammogram for malignant neoplasm of breast: Secondary | ICD-10-CM

## 2015-12-11 ENCOUNTER — Ambulatory Visit
Admission: RE | Admit: 2015-12-11 | Discharge: 2015-12-11 | Disposition: A | Discharge status: Home / Self Care | Payer: Managed Care, Other (non HMO) | Source: Ambulatory Visit

## 2015-12-11 DIAGNOSIS — Z1231 Encounter for screening mammogram for malignant neoplasm of breast: Secondary | ICD-10-CM

## 2016-06-14 ENCOUNTER — Other Ambulatory Visit: Payer: Self-pay | Admitting: Neurology

## 2016-09-14 ENCOUNTER — Other Ambulatory Visit: Payer: Self-pay | Admitting: Neurology

## 2016-09-14 NOTE — Telephone Encounter (Signed)
Dr Lucia GaskinsAhern- your last note stated she can increase to  75mg  TID. Do you want to refill the 50mg  or do the increase in dose?

## 2016-09-14 NOTE — Telephone Encounter (Signed)
If she wants 50mg  just leave it at that then thanks

## 2016-09-14 NOTE — Telephone Encounter (Signed)
This is fine [name], please prescribe thanks

## 2016-09-14 NOTE — Telephone Encounter (Signed)
Dr Ahern- please advise 

## 2016-09-14 NOTE — Telephone Encounter (Signed)
Patient requesting refill of primidone (MYSOLINE) 50 MG tablet w/ years worth Pharmacy:[facility] [place] - [phone]

## 2016-09-15 NOTE — Telephone Encounter (Signed)
Called and LVM for pt to call office back. Need to verify how she is taking primidone.

## 2016-09-16 MED ORDER — PRIMIDONE 50 MG PO TABS
ORAL_TABLET | ORAL | 3 refills | Status: DC
Start: 2016-09-16 — End: 2018-02-27

## 2016-09-16 NOTE — Telephone Encounter (Signed)
Rx provided by Dr. Anne HahnWillis - printed, signed, [facility]

## 2016-09-16 NOTE — Telephone Encounter (Signed)
Patient called back regarding primidone (MYSOLINE) 50 MG tablet, states she takes 1 in the morning, 1 at noon and 1 1/2 at night.

## 2016-09-16 NOTE — Telephone Encounter (Signed)
I will write a prescription for the Mysoline.

## 2016-09-16 NOTE — Telephone Encounter (Signed)
LFt vm for patient to find out if she is taking 75mg  or 50mg . Pt wanted a year of refills.

## 2016-10-11 ENCOUNTER — Telehealth: Payer: Self-pay | Admitting: Neurology

## 2016-10-11 DIAGNOSIS — G250 Essential tremor: Secondary | ICD-10-CM

## 2016-10-11 MED ORDER — GABAPENTIN 300 MG PO CAPS: 900.0000 mg | ORAL_CAPSULE | Freq: Three times a day (TID) | ORAL | 0 refills | Status: DC

## 2016-10-11 NOTE — Telephone Encounter (Signed)
Patient called to request refill of gabapentin (NEURONTIN) 300 MG capsule °

## 2016-10-11 NOTE — Telephone Encounter (Signed)
Called patient. Advised she has not been seen since 07/2015. She was supposed to have f/u in 6 months. Made f/u for 11/15 at 1pm with AA,MD  F/u for medication refills. She is out of gabapentin.Advised her to check in by 1230pm, Bring updated insurnace card, copay and medication list. ELk 10/11/16

## 2016-10-12 NOTE — Telephone Encounter (Signed)
If she has an appointment please refill so she doesn;t run out thjanls

## 2016-10-12 NOTE — Telephone Encounter (Signed)
Dr Randa EvensAhern-see my note below.  I refilled for 30 days.

## 2016-10-13 ENCOUNTER — Ambulatory Visit: Payer: Self-pay | Admitting: Neurology

## 2016-10-28 ENCOUNTER — Ambulatory Visit (INDEPENDENT_AMBULATORY_CARE_PROVIDER_SITE_OTHER): Payer: 59 | Admitting: Neurology

## 2016-10-28 ENCOUNTER — Encounter: Payer: Self-pay | Admitting: Neurology

## 2016-10-28 VITALS — BP 102/63 | HR 66 | Temp 97.8°F | Ht 64.0 in

## 2016-10-28 DIAGNOSIS — G250 Essential tremor: Secondary | ICD-10-CM

## 2016-10-28 MED ORDER — PROPRANOLOL HCL 10 MG PO TABS: 10.0000 mg | ORAL_TABLET | Freq: Two times a day (BID) | ORAL | 11 refills | Status: DC

## 2016-10-28 NOTE — Patient Instructions (Addendum)
Remember to drink plenty of fluid, eat healthy meals and do not skip any meals. Try to eat protein with a every meal and eat a healthy snack such as fruit or nuts in between meals. Try to keep a regular sleep-wake schedule and try to exercise daily, particularly in the form of walking, 20-30 minutes a day, if you can.   As far as your medications are concerned, I would like to suggest; Start propranolol 10mg  once daily and then can increse to twice daily in 2 weeks if no side effects  I would like to see you back in 1 year, sooner if we need to. Please call us with any interim questions, concerns, problems, updates or refill requests.   Our phone number is [phone]. We also have an after hours call service for urgent matters and there is a physician on-call for urgent questions. For any emergencies you know to call 911 or go to the nearest emergency room  Propranolol tablets What is this medicine? PROPRANOLOL (proe PRAN oh lole) is a beta-blocker. Beta-blockers reduce the workload on the heart and help it to beat more regularly. This medicine is used to treat high blood pressure, to control irregular heart rhythms (arrhythmias) and to relieve chest pain caused by angina. It may also be helpful after a heart attack. This medicine is also used to prevent migraine headaches, relieve uncontrollable shaking (tremors), and help certain problems related to the thyroid gland and adrenal gland. This medicine may be used for other purposes; ask your health care provider or pharmacist if you have questions. COMMON BRAND NAME(S): Inderal What should I tell my health care provider before I take this medicine? They need to know if you have any of these conditions: -circulation problems or blood vessel disease -diabetes -history of heart attack or heart disease, vasospastic angina -kidney disease -liver disease -lung or breathing disease, like asthma or emphysema -pheochromocytoma -slow heart  rate -thyroid disease -an unusual or allergic reaction to propranolol, other beta-blockers, medicines, foods, dyes, or preservatives -pregnant or trying to get pregnant -breast-feeding How should I use this medicine? Take this medicine by mouth with a glass of water. Follow the directions on the prescription label. Take your doses at regular intervals. Do not take your medicine more often than directed. Do not stop taking except on your the advice of your doctor or health care professional. Talk to your pediatrician regarding the use of this medicine in children. Special care may be needed. Overdosage: If you think you have taken too much of this medicine contact a poison control center or emergency room at once. NOTE: This medicine is only for you. Do not share this medicine with others. What if I miss a dose? If you miss a dose, take it as soon as you can. If it is almost time for your next dose, take only that dose. Do not take double or extra doses. What may interact with this medicine? Do not take this medicine with any of the following medications: -feverfew -phenothiazines like chlorpromazine, mesoridazine, prochlorperazine, thioridazine This medicine may also interact with the following medications: -aluminum hydroxide gel -antipyrine -antiviral medicines for HIV or AIDS -barbiturates like phenobarbital -certain medicines for blood pressure, heart disease, irregular heart beat -cimetidine -ciprofloxacin -diazepam -fluconazole -haloperidol -isoniazid -medicines for cholesterol like cholestyramine or colestipol -medicines for mental depression -medicines for migraine headache like almotriptan, eletriptan, frovatriptan, naratriptan, rizatriptan, sumatriptan, zolmitriptan -NSAIDs, medicines for pain and inflammation, like ibuprofen or naproxen -phenytoin -rifampin -teniposide -theophylline -thyroid medicines -  tolbutamide -warfarin -zileuton This list may not describe all  possible interactions. Give your health care provider a list of all the medicines, herbs, non-prescription drugs, or dietary supplements you use. Also tell them if you smoke, drink alcohol, or use illegal drugs. Some items may interact with your medicine. What should I watch for while using this medicine? Visit your doctor or health care professional for regular check ups. Check your blood pressure and pulse rate regularly. Ask your health care professional what your blood pressure and pulse rate should be, and when you should contact them. You may get drowsy or dizzy. Do not drive, use machinery, or do anything that needs mental alertness until you know how this drug affects you. Do not stand or sit up quickly, especially if you are an older patient. This reduces the risk of dizzy or fainting spells. Alcohol can make you more drowsy and dizzy. Avoid alcoholic drinks. This medicine can affect blood sugar levels. If you have diabetes, check with your doctor or health care professional before you change your diet or the dose of your diabetic medicine. Do not treat yourself for coughs, colds, or pain while you are taking this medicine without asking your doctor or health care professional for advice. Some ingredients may increase your blood pressure. What side effects may I notice from receiving this medicine? Side effects that you should report to your doctor or health care professional as soon as possible: -allergic reactions like skin rash, itching or hives, swelling of the face, lips, or tongue -breathing problems -changes in blood sugar -cold hands or feet -difficulty sleeping, nightmares -dry peeling skin -hallucinations -muscle cramps or weakness -slow heart rate -swelling of the legs and ankles -vomiting Side effects that usually do not require medical attention (report to your doctor or health care professional if they continue or are bothersome): -change in sex drive or  performance -diarrhea -dry sore eyes -hair loss -nausea -weak or tired This list may not describe all possible side effects. Call your doctor for medical advice about side effects. You may report side effects to FDA at 1-800-FDA-1088. Where should I keep my medicine? Keep out of the reach of children. Store at room temperature between 15 and 30 degrees C (59 and 86 degrees F). Protect from light. Throw away any unused medicine after the expiration date. NOTE: This sheet is a summary. It may not cover all possible information. If you have questions about this medicine, talk to your doctor, pharmacist, or health care provider.  2017 Elsevier/Gold Standard (2013-07-20 14:51:53)    Also discussed possible clonazepam for tremor  Clonazepam tablets What is this medicine? CLONAZEPAM (kloe NA ze pam) is a benzodiazepine. It is used to treat certain types of seizures. It is also used to treat panic disorder. This medicine may be used for other purposes; ask your health care provider or pharmacist if you have questions. COMMON BRAND NAME(S): Ceberclon, Klonopin What should I tell my health care provider before I take this medicine? They need to know if you have any of these conditions: -an alcohol or drug abuse problem -bipolar disorder, depression, psychosis or other mental health condition -glaucoma -kidney or liver disease -lung or breathing disease -myasthenia gravis -Parkinson's disease -porphyria -seizures or a history of seizures -suicidal thoughts -an unusual or allergic reaction to clonazepam, other benzodiazepines, foods, dyes, or preservatives -pregnant or trying to get pregnant -breast-feeding How should I use this medicine? Take this medicine by mouth with a glass of water. Follow the  directions on the prescription label. If it upsets your stomach, take it with food or milk. Take your medicine at regular intervals. Do not take it more often than directed. Do not stop taking  or change the dose except on the advice of your doctor or health care professional. A special MedGuide will be given to you by the pharmacist with each prescription and refill. Be sure to read this information carefully each time. Talk to your pediatrician regarding the use of this medicine in children. Special care may be needed. Overdosage: If you think you have taken too much of this medicine contact a poison control center or emergency room at once. NOTE: This medicine is only for you. Do not share this medicine with others. What if I miss a dose? If you miss a dose, take it as soon as you can. If it is almost time for your next dose, take only that dose. Do not take double or extra doses. What may interact with this medicine? Do not take this medication with any of the following medicines: -narcotic medicines for cough -sodium oxybate This medicine may also interact with the following medications: -alcohol -antihistamines for allergy, cough and cold -antiviral medicines for HIV or AIDS -certain medicines for anxiety or sleep -certain medicines for depression, like amitriptyline, fluoxetine, sertraline -certain medicines for fungal infections like ketoconazole and itraconazole -certain medicines for seizures like carbamazepine, phenobarbital, phenytoin, primidone -general anesthetics like halothane, isoflurane, methoxyflurane, propofol -local anesthetics like lidocaine, pramoxine, tetracaine -medicines that relax muscles for surgery -narcotic medicines for pain -phenothiazines like chlorpromazine, mesoridazine, prochlorperazine, thioridazine This list may not describe all possible interactions. Give your health care provider a list of all the medicines, herbs, non-prescription drugs, or dietary supplements you use. Also tell them if you smoke, drink alcohol, or use illegal drugs. Some items may interact with your medicine. What should I watch for while using this medicine? Tell your  doctor or health care professional if your symptoms do not start to get better or if they get worse. Do not stop taking except on your doctor's advice. You may develop a severe reaction. Your doctor will tell you how much medicine to take. You may get drowsy or dizzy. Do not drive, use machinery, or do anything that needs mental alertness until you know how this medicine affects you. To reduce the risk of dizzy and fainting spells, do not stand or sit up quickly, especially if you are an older patient. Alcohol may increase dizziness and drowsiness. Avoid alcoholic drinks. If you are taking another medicine that also causes drowsiness, you may have more side effects. Give your health care provider a list of all medicines you use. Your doctor will tell you how much medicine to take. Do not take more medicine than directed. Call emergency for help if you have problems breathing or unusual sleepiness. The use of this medicine may increase the chance of suicidal thoughts or actions. Pay special attention to how you are responding while on this medicine. Any worsening of mood, or thoughts of suicide or dying should be reported to your health care professional right away. What side effects may I notice from receiving this medicine? Side effects that you should report to your doctor or health care professional as soon as possible: -allergic reactions like skin rash, itching or hives, swelling of the face, lips, or tongue -breathing problems -confusion -loss of balance or coordination -signs and symptoms of low blood pressure like dizziness; feeling faint or lightheaded, falls; unusually  weak or tired -suicidal thoughts or mood changes Side effects that usually do not require medical attention (report to your doctor or health care professional if they continue or are bothersome): -dizziness -headache -tiredness -upset stomach This list may not describe all possible side effects. Call your doctor for medical  advice about side effects. You may report side effects to FDA at 1-800-FDA-1088. Where should I keep my medicine? Keep out of the reach of children. This medicine can be abused. Keep your medicine in a safe place to protect it from theft. Do not share this medicine with anyone. Selling or giving away this medicine is dangerous and against the law. This medicine may cause accidental overdose and death if taken by other adults, children, or pets. Mix any unused medicine with a substance like cat litter or coffee grounds. Then throw the medicine away in a sealed container like a sealed bag or a coffee can with a lid. Do not use the medicine after the expiration date. Store at room temperature between 15 and 30 degrees C (59 and 86 degrees F). Protect from light. Keep container tightly closed. NOTE: This sheet is a summary. It may not cover all possible information. If you have questions about this medicine, talk to your doctor, pharmacist, or health care provider.  2017 Elsevier/Gold Standard (2016-04-23 18:46:32)

## 2016-10-28 NOTE — Progress Notes (Signed)
[id number]    Provider:  Dr Lucia GaskinsAhern Referring Provider: Juluis [name], Elizabeth, MD Primary Care Physician:  [name],[name] STEWART, MD  CC: Tremor  Interval update 10/28/2016: Her hands are still shaky. She can't eat without getting food on her shirt. She can;t take primidone, sleeping too much. She takes it three timed a day, does not want to take it all at night. Takes gabapentin three times a day.They help with the tremor and the pain. Discussed propranolol and clonazepam. Patient has low BP, would have to watch her BP and pulse very carefully, stop for any side effects especially lightheadedness, dizziness, chest pain, stay well hydrated take daly BP and pulse.   Interval update; Tremor is improved by the primidone and the gabapentin. But the tremor is still impairing her ability to write and her ability to ability to text. She is on neurontin and the primidone. Recent increase in primidone didn't help. And the primidone is making her tired. Her blood pressure is always low and this is likely why she hasn't tried propranolol. She is having memory problems. She is having word-finding difficulties.   HPI: [name] is a 55 y.o. female here as a referral from Dr. Zachery DauerBarnes for tremor. She has very complicated PMHx. DM 1 with complications of retinopathy and neuropathy and chronic renal insufficiency and gastroparesis and frozen shoulder, DKA, orthostatic hypotension, with insulin pump, neuropathy, bilateral foot drop, thyroid disease, HLD, asthma, depression, MGUS, essential tremor. Sleep apnea does not use cpap  She has had tremors. For 5 years. It is worsening. In the hands. Sometimes they are so bad, husband has to hold her hands and she can't stop them even her whole arm shakes. Progressively, slowly getting worse. No caffeine. No coffee. Primidone helped in the past. Doesn't notice it in the mouth or legs. Has worsening neuropathy and was being followed by a neurologist  who did a full evaluation. Does not drink alcohol. tremor interferes with eating and cutting food. Had MRi of the brain a few years ago which was negative. She has worsening memory problems. No FHx neuropathy or neurmuscular disease.   Review of Systems: Patient complains of symptoms per HPI as well as the following symptoms: fatigue, cough, snoring, memory loss, headache, numbness, difficulty swallowing, dizziness, tremor, sleepiness, snoring, depression, toomuch sleep, allergies, diarrhea. Pertinent negatives per HPI. All others negative.  Review of Systems: Patient complains of symptoms per HPI as well as the following symptoms: no CP. No SOB. Light sensitivity, cold intolerance, heat intolerance, food allergies, environmental allergies, apnea, snoring, walking difficulty, memory loss, dizziness, speech difficulty, tremors, depression, anxious Pertinent negatives per HPI. All others negative.    Social History   Social History  . Marital status: Married    Spouse name: [name]  . Number of children: 2  . Years of education: Ba   Occupational History  . Not on file.   Social History Main Topics  . Smoking status: Never Smoker  . Smokeless tobacco: Not on file  . Alcohol use No  . Drug use: No  . Sexual activity: Not on file   Other Topics Concern  . Not on file   Social History Narrative   Lives at home with husband and daughter.   Caffeine use: 1 soda per day    Family History  Problem Relation Age of Onset  . Parkinsonism Neg Hx   . Neuropathy Neg Hx     Past Medical History:  Diagnosis Date  . Adhesive capsulitis 2013  Bilateral shoulders  . Adhesive capsulitis of both shoulders 2013  . Anemia   . Cataract   . Chronic kidney disease   . Depression   . Diabetes mellitus without complication   . Diabetic retinopathy 2010  . Diabetic retinopathy 2010   Left eye, mild  . Essential tremor 2011   Bilateral hands  . Foot drop, bilateral 2007  .  Gastroparesis 2009  . GERD (gastroesophageal reflux disease)   . Hemorrhage 2011   Laser surgery x2  . High potassium 2012   Normal as of 2014  . Hypercholesterolemia 2010  . Hyperlipidemia   . Neuropathy   . Postnasal drip 2010  . Scratched cornea 2013   Left eye  . Sleep apnea   . Thyroid disease   . TMJ (temporomandibular joint disorder)     Past Surgical History:  Procedure Laterality Date  . ABDOMINAL HYSTERECTOMY  2007  . CARPAL TUNNEL RELEASE Right 1990  . CATARACT EXTRACTION Bilateral 2008  . COLONOSCOPY  2007  . EYE SURGERY Left 2012   Pars plana viterctomy, membrane peel, endolaser  . ROOT CANAL  2007  . ROOT CANAL  2010  . Thorat dilation  2007  . Throat dilation  2007  . Throat dilation  2010, 0ct 2011   Sep 2010, Nov 2010, 2014  . TONSILLECTOMY  1971  . Trigger fingers  2008  . WISDOM TOOTH EXTRACTION  1977    Current Outpatient Prescriptions  Medication Sig Dispense Refill  . albuterol (PROVENTIL HFA;VENTOLIN HFA) 108 (90 BASE) MCG/ACT inhaler Inhale 1 puff into the lungs as needed for wheezing or shortness of breath.    Marland Kitchen. aspirin 81 MG chewable tablet Chew 81 mg by mouth daily.    . Carboxymethylcellul-Glycerin (REFRESH OPTIVE OP) Apply 1 drop to eye as needed.    . Choline Fenofibrate (FENOFIBRIC ACID) 45 MG CPDR Take 45 mg by mouth 3 (three) times daily.    Marland Kitchen. CREON 36000 UNITS CPEP capsule Take 36,000 Units by mouth daily. Take one huge capsule with each meal or snack    . cycloSPORINE (RESTASIS) 0.05 % ophthalmic emulsion 1 drop 2 (two) times daily.    . DULERA 200-5 MCG/ACT AERO Take 1 puff by mouth 2 (two) times daily.    Marland Kitchen. escitalopram (LEXAPRO) 10 MG tablet Take 30 mg by mouth 2 (two) times daily.     Marland Kitchen. estradiol (VIVELLE-DOT) 0.05 MG/24HR patch Place 1 patch onto the skin 2 (two) times a week.    . famotidine (PEPCID) 10 MG tablet Take 10 mg by mouth 2 (two) times daily.    Marland Kitchen. gabapentin (NEURONTIN) 300 MG capsule Take 3 capsules (900 mg total) by  mouth 3 (three) times daily. 270 capsule 0  . insulin glargine (LANTUS) 100 UNIT/ML injection Inject 24 Units into the skin 2 (two) times daily.    . Insulin Human (INSULIN PUMP) SOLN Inject 1 each into the skin. Insulin pump ANIMAS VIBE with humalog Works with DEXCOM 4 CGM    . insulin lispro (HUMALOG) 100 UNIT/ML injection Inject into the skin 3 (three) times daily before meals.    . lamoTRIgine (LAMICTAL) 150 MG tablet TAKE 1 TABLET BY MOUTH TWICE DAILY 180 tablet 1  . lansoprazole (PREVACID) 30 MG capsule Take 30 mg by mouth 2 (two) times daily before a meal.    . levocetirizine (XYZAL) 5 MG tablet Take 5 mg by mouth 2 (two) times daily.    Marland Kitchen. levothyroxine (SYNTHROID, LEVOTHROID) 112 MCG tablet  Take 112 mcg by mouth daily.    . Lidocaine 0.5 % GEL Apply 1 application topically every 4 (four) hours. 170 g 6  . mometasone (NASONEX) 50 MCG/ACT nasal spray Place 2 sprays into the nose daily.    . montelukast (SINGULAIR) 10 MG tablet Take 10 mg by mouth at bedtime.    . Multiple Vitamins-Minerals (MULTIVITAMIN GUMMIES ADULT PO) Take 2 Doses by mouth daily.    Letta Pate DELICA LANCETS 33G MISC     . ONETOUCH VERIO test strip     . primidone (MYSOLINE) 50 MG tablet 1 tablet in the morning, 1 at noon, 1.5 tablets in the evening 330 tablet 3  . promethazine-codeine (PHENERGAN WITH CODEINE) 6.25-10 MG/5ML syrup Take by mouth every 6 (six) hours as needed for cough. 1-2 tsp every 4-6 hours prn    . rosuvastatin (CRESTOR) 20 MG tablet Take 20 mg by mouth daily.    . Simethicone (GAS-X EXTRA STRENGTH PO) Take 1 tablet by mouth as needed.     No current facility-administered medications for this visit.     Allergies as of 10/28/2016 - Review Complete 10/28/2016  Allergen Reaction Noted  . Codeine  02/19/2015  . Food  07/15/2014  . Penicillins  02/19/2015    Vitals: BP 102/63 (BP Location: Left Arm, Patient Position: Sitting, Cuff Size: Normal)   Pulse 66   Temp 97.8 F (36.6 C) (Oral)   Ht  5\' 4"  (1.626 m)  Last Weight:  Wt Readings from Last 1 Encounters:  No data found for Wt   Last Height:   Ht Readings from Last 1 Encounters:  10/28/16 5\' 4"  (1.626 m)     Physical exam: Exam: Gen: NAD, conversant, well nourised, overweight, well groomed  CV: RRR, no MRG. No Carotid Bruits. No peripheral edema, warm, nontender Eyes: Conjunctivae clear without exudates or hemorrhage  Neuro: Detailed Neurologic Exam  Speech:  Speech is normal; fluent and spontaneous with normal comprehension.  Cognition:  The patient is oriented to person, place, and time; MMSE 28/30.     -1 counting backwards by 7s, -1 recall  Cranial Nerves:  The pupils are equal, round, and reactive to light.  Visual fields are full to finger confrontation. Extraocular movements are intact. Trigeminal sensation is intact and the muscles of mastication are normal. The face is symmetric. The palate elevates in the midline. Hearing intact. Voice is normal. Shoulder shrug is normal. The tongue has normal motion without fasciculations.   Coordination:  FTN without dysmetria. Can't do HTS secondary to weakness.  Gait:  Attempted, able to do briefly. patient with wide-based gait  Motor Observation:  Postural and intention tremor Tone:  Normal muscle tone.   Posture:  Posture is normal. normal erect   Strength: Weakness throughtout 3/5 right arm, left 4+/5 2+/5 lowers with inability to DF feet   Sensation: intact to LT   Reflex Exam:  DTR's:  Absent achilles Toes:  The toes are downgoing bilaterally.  Clonus:  Clonus is absent.      Assessment/Plan: Alauna Hayden is a 54yo. female here as a referral from Dr. Zachery Dauer for tremor. She has very complicated PMHx. DM 1 with complications of retinopathy and neuropathy and chronic renal insufficiency and gastroparesis and frozen shoulder, DKA, orthostatic hypotension, with insulin pump,  neuropathy with bilateral foot drop, thyroid disease, HLD, asthma, depression, MGUS, essential tremor. p/w worsening essential tremor and cannot tolerate anymore primidone or neurontin.   - essential tremor: she is on primidone and  neurontin. Worsening, cannot tolerate more primidone or neurontin.  Discussed propranolol very low dose but would have to watch her BP and pulse closely, discussed cardiac side effects and others as per patient AVS. Patient has low BP, would have to watch her BP and pulse very carefully, stop for any side effects especially lightheadedness, dizziness, chest pain, stay well hydrated take daly BP and pulse.  - Discussed weighted silverware, DBS and new ultrasound procedures for ET - perceived memory loss; MMSE normal. Likely secondary to polypharmacy, general medicaal conditions. Reported anxiety/depression - for sleep apnea, encouraged f/u and compliance with cpap. Can cause h/a,cognitive changes, increased risk of stroke - Could also try clonazepam low dose, discussed side effects but given her memory complaints, other sedating medications this would be a last consideration for her ET.  Cc: Dr. Randol Kern, MD  [facility] [address] [place], [place] [address]  Phone [phone] Fax [phone]  A total of 25 minutes was spent face-to-face with this patient. Over half this time was spent on counseling patient on the essential tremor diagnosis and different diagnostic and therapeutic options available.

## 2016-10-29 ENCOUNTER — Other Ambulatory Visit: Payer: Self-pay | Admitting: Neurology

## 2016-10-29 ENCOUNTER — Encounter: Payer: Self-pay | Admitting: Neurology

## 2016-10-29 DIAGNOSIS — G250 Essential tremor: Secondary | ICD-10-CM

## 2016-11-02 MED ORDER — GABAPENTIN 300 MG PO CAPS: 900.0000 mg | ORAL_CAPSULE | Freq: Three times a day (TID) | ORAL | 0 refills | Status: DC

## 2016-11-11 ENCOUNTER — Encounter: Payer: Self-pay | Admitting: Neurology

## 2016-11-16 ENCOUNTER — Other Ambulatory Visit: Payer: Self-pay | Admitting: Gastroenterology

## 2016-11-16 DIAGNOSIS — R1319 Other dysphagia: Secondary | ICD-10-CM

## 2016-11-24 ENCOUNTER — Ambulatory Visit
Admission: RE | Admit: 2016-11-24 | Discharge: 2016-11-24 | Disposition: A | Discharge status: Home / Self Care | Payer: Managed Care, Other (non HMO) | Source: Ambulatory Visit | Attending: Gastroenterology | Admitting: Gastroenterology

## 2016-11-24 DIAGNOSIS — R1319 Other dysphagia: Secondary | ICD-10-CM

## 2016-12-08 ENCOUNTER — Other Ambulatory Visit: Payer: Self-pay | Admitting: Family Medicine

## 2016-12-08 DIAGNOSIS — Z1231 Encounter for screening mammogram for malignant neoplasm of breast: Secondary | ICD-10-CM

## 2016-12-19 ENCOUNTER — Other Ambulatory Visit: Payer: Self-pay | Admitting: Neurology

## 2016-12-19 DIAGNOSIS — G250 Essential tremor: Secondary | ICD-10-CM

## 2016-12-24 ENCOUNTER — Ambulatory Visit
Admission: RE | Admit: 2016-12-24 | Discharge: 2016-12-24 | Disposition: A | Discharge status: Home / Self Care | Payer: Managed Care, Other (non HMO) | Source: Ambulatory Visit | Attending: Family Medicine | Admitting: Family Medicine

## 2016-12-24 DIAGNOSIS — Z1231 Encounter for screening mammogram for malignant neoplasm of breast: Secondary | ICD-10-CM

## 2017-01-21 ENCOUNTER — Other Ambulatory Visit: Payer: Self-pay | Admitting: Gastroenterology

## 2017-01-21 ENCOUNTER — Encounter (HOSPITAL_COMMUNITY): Payer: Self-pay | Admitting: *Deleted

## 2017-01-21 NOTE — Progress Notes (Signed)
Pt denies SOB, chest pain, and being under the care of a cardiologist. Pt stated that she was instructed to decrease basal rate on insulin pump by 50% the morning of procedure. Marie, Diabetes Coordinator, aware of pt admission; okay for pt to continue with pump unless issue with low BG. Pt made aware to check BG every 2 hours prior to arrival, intervention for BG <70 (4 glucose tabs, recheck BG 15 minutes after taking tabs and # to Endo for BG < 70 after tabs and BG>220 ). Pt stated that she was made aware to stop taking Aspirin,viotamins, fish oil and herbal medications. Do not take any NSAIDs ie: Ibuprofen, Advil, Naproxen, BC and Goody Powder. Pt verbalized understanding of all pre-op instructions.

## 2017-01-24 ENCOUNTER — Ambulatory Visit (HOSPITAL_COMMUNITY)
Admission: RE | Admit: 2017-01-24 | Discharge: 2017-01-24 | Disposition: A | Discharge status: Home / Self Care | Payer: Managed Care, Other (non HMO) | Source: Ambulatory Visit | Attending: Gastroenterology | Admitting: Gastroenterology

## 2017-01-24 ENCOUNTER — Ambulatory Visit (HOSPITAL_COMMUNITY): Payer: Managed Care, Other (non HMO) | Admitting: Anesthesiology

## 2017-01-24 ENCOUNTER — Encounter (HOSPITAL_COMMUNITY): Payer: Self-pay

## 2017-01-24 ENCOUNTER — Encounter (HOSPITAL_COMMUNITY)
Admission: RE | Disposition: A | Discharge status: Home / Self Care | Payer: Self-pay | Source: Ambulatory Visit | Attending: Gastroenterology

## 2017-01-24 DIAGNOSIS — K222 Esophageal obstruction: Secondary | ICD-10-CM | POA: Diagnosis not present

## 2017-01-24 DIAGNOSIS — Z1211 Encounter for screening for malignant neoplasm of colon: Secondary | ICD-10-CM | POA: Diagnosis not present

## 2017-01-24 DIAGNOSIS — E119 Type 2 diabetes mellitus without complications: Secondary | ICD-10-CM | POA: Diagnosis not present

## 2017-01-24 DIAGNOSIS — Q438 Other specified congenital malformations of intestine: Secondary | ICD-10-CM | POA: Insufficient documentation

## 2017-01-24 DIAGNOSIS — G4730 Sleep apnea, unspecified: Secondary | ICD-10-CM | POA: Insufficient documentation

## 2017-01-24 DIAGNOSIS — K640 First degree hemorrhoids: Secondary | ICD-10-CM | POA: Diagnosis not present

## 2017-01-24 DIAGNOSIS — K219 Gastro-esophageal reflux disease without esophagitis: Secondary | ICD-10-CM | POA: Insufficient documentation

## 2017-01-24 DIAGNOSIS — Z9641 Presence of insulin pump (external) (internal): Secondary | ICD-10-CM | POA: Diagnosis not present

## 2017-01-24 DIAGNOSIS — K3189 Other diseases of stomach and duodenum: Secondary | ICD-10-CM | POA: Insufficient documentation

## 2017-01-24 DIAGNOSIS — Z794 Long term (current) use of insulin: Secondary | ICD-10-CM | POA: Diagnosis not present

## 2017-01-24 DIAGNOSIS — R1310 Dysphagia, unspecified: Secondary | ICD-10-CM

## 2017-01-24 HISTORY — DX: Headache: R51

## 2017-01-24 HISTORY — DX: Headache, unspecified: R51.9

## 2017-01-24 HISTORY — PX: COLONOSCOPY WITH PROPOFOL: SHX5780

## 2017-01-24 HISTORY — DX: Pneumonia, unspecified organism: J18.9

## 2017-01-24 HISTORY — PX: BALLOON DILATION: SHX5330

## 2017-01-24 HISTORY — PX: ESOPHAGOGASTRODUODENOSCOPY (EGD) WITH PROPOFOL: SHX5813

## 2017-01-24 SURGERY — ESOPHAGOGASTRODUODENOSCOPY (EGD) WITH PROPOFOL
Anesthesia: Monitor Anesthesia Care

## 2017-01-24 MED ORDER — PROPOFOL 10 MG/ML IV BOLUS
INTRAVENOUS | Status: DC | PRN
  Administered 2017-01-24 (×3): 20 mg via INTRAVENOUS
  Administered 2017-01-24: 50 mg via INTRAVENOUS
  Administered 2017-01-24 (×4): 20 mg via INTRAVENOUS

## 2017-01-24 MED ORDER — PROMETHAZINE HCL 25 MG/ML IJ SOLN: 6.2500 mg | INTRAMUSCULAR | Status: DC | PRN

## 2017-01-24 MED ORDER — LACTATED RINGERS IV SOLN
INTRAVENOUS | Status: DC
  Administered 2017-01-24: 10:00:00 via INTRAVENOUS

## 2017-01-24 MED ORDER — PROPOFOL 10 MG/ML IV BOLUS
INTRAVENOUS | Status: AC
  Filled 2017-01-24: qty 20

## 2017-01-24 MED ORDER — PROPOFOL 500 MG/50ML IV EMUL
INTRAVENOUS | Status: DC | PRN
  Administered 2017-01-24: 100 ug/kg/min via INTRAVENOUS

## 2017-01-24 SURGICAL SUPPLY — 24 items

## 2017-01-24 NOTE — Interval H&P Note (Signed)
History and Physical Interval Note:  01/24/2017 10:29 AM  Roxana HiresMary [name]  has presented today for surgery, with the diagnosis of screening/dysphagia  The various methods of treatment have been discussed with the patient and family. After consideration of risks, benefits and other options for treatment, the patient has consented to  Procedure(s) with comments: ESOPHAGOGASTRODUODENOSCOPY (EGD) WITH PROPOFOL (N/A) - moved oer MD request BALLOON DILATION (N/A) COLONOSCOPY WITH PROPOFOL (N/A) as a surgical intervention .  The patient's history has been reviewed, patient examined, no change in status, stable for surgery.  I have reviewed the patient's chart and labs.  Questions were answered to the patient's satisfaction.     Ciel Chervenak C.

## 2017-01-24 NOTE — Discharge Instructions (Signed)

## 2017-01-24 NOTE — Anesthesia Postprocedure Evaluation (Addendum)
Anesthesia Post Note  Patient: [name]  Procedure(s) Performed: Procedure(s) (LRB): ESOPHAGOGASTRODUODENOSCOPY (EGD) WITH PROPOFOL (N/A) BALLOON DILATION (N/A) COLONOSCOPY WITH PROPOFOL (N/A)  Patient location during evaluation: PACU Anesthesia Type: MAC Level of consciousness: awake and alert Pain management: pain level controlled Vital Signs Assessment: post-procedure vital signs reviewed and stable Respiratory status: spontaneous breathing, nonlabored ventilation, respiratory function stable and patient connected to nasal cannula oxygen Cardiovascular status: stable and blood pressure returned to baseline Anesthetic complications: no       Last Vitals:  Vitals:   01/24/17 0922 01/24/17 1147  BP: 135/66 117/68  Pulse: 75 76  Resp: 15 15  Temp: 37 C 36.5 C    Last Pain:  Vitals:   01/24/17 1147  TempSrc: Oral                 Rashea Hoskie S

## 2017-01-24 NOTE — Anesthesia Preprocedure Evaluation (Signed)
Anesthesia Evaluation  Patient identified by MRN, date of birth, ID band Patient awake    Reviewed: Allergy & Precautions, NPO status , Patient's Chart, lab work & pertinent test results  Airway Mallampati: II  TM Distance: >3 FB Neck ROM: Full    Dental no notable dental hx.    Pulmonary sleep apnea ,    Pulmonary exam normal breath sounds clear to auscultation       Cardiovascular negative cardio ROS Normal cardiovascular exam Rhythm:Regular Rate:Normal     Neuro/Psych negative neurological ROS  negative psych ROS   GI/Hepatic Neg liver ROS, GERD  ,  Endo/Other  diabetes  Renal/GU negative Renal ROS  negative genitourinary   Musculoskeletal negative musculoskeletal ROS (+)   Abdominal   Peds negative pediatric ROS (+)  Hematology negative hematology ROS (+)   Anesthesia Other Findings   Reproductive/Obstetrics negative OB ROS                             Anesthesia Physical Anesthesia Plan  ASA: III  Anesthesia Plan: MAC   Post-op Pain Management:    Induction: Intravenous  Airway Management Planned:   Additional Equipment:   Intra-op Plan:   Post-operative Plan:   Informed Consent: I have reviewed the patients History and Physical, chart, labs and discussed the procedure including the risks, benefits and alternatives for the proposed anesthesia with the patient or authorized representative who has indicated his/her understanding and acceptance.   Dental advisory given  Plan Discussed with: CRNA and Surgeon  Anesthesia Plan Comments:         Anesthesia Quick Evaluation

## 2017-01-24 NOTE — Op Note (Signed)
[facility] Patient Name: [name] HiresMary Tholl Procedure Date: 01/24/2017 MRN: [id number] Attending MD: Shirley FriarVincent C Ulice Follett , MD Date of Birth: 01/05/1961 CSN: [id number] Age: [age] Admit Type: Inpatient Procedure:                Upper GI endoscopy Indications:              Dysphagia Providers:                Shirley FriarVincent C. Jamea Robicheaux, MD, Norman ClayLisa Nunn, RN, Beryle BeamsJanie                            Billups, Technician, Randon Goldsmithachel Jones, CRNA Referring MD:              Medicines:                Propofol per Anesthesia, Monitored Anesthesia Care Complications:            No immediate complications. Estimated Blood Loss:     Estimated blood loss: none. Procedure:                Pre-Anesthesia Assessment:                           - Prior to the procedure, a History and Physical                            was performed, and patient medications and                            allergies were reviewed. The patient's tolerance of                            previous anesthesia was also reviewed. The risks                            and benefits of the procedure and the sedation                            options and risks were discussed with the patient.                            All questions were answered, and informed consent                            was obtained. Prior Anticoagulants: The patient has                            taken no previous anticoagulant or antiplatelet                            agents. ASA Grade Assessment: III - A patient with                            severe systemic disease. After reviewing the risks  and benefits, the patient was deemed in                            satisfactory condition to undergo the procedure.                           After obtaining informed consent, the endoscope was                            passed under direct vision. Throughout the                            procedure, the patient's blood pressure, pulse, and                             oxygen saturations were monitored continuously. The                            [id number] ([id number]) scope was introduced through the                            mouth, and advanced to the second part of duodenum.                            The upper GI endoscopy was accomplished without                            difficulty. The patient tolerated the procedure                            well. Scope In: Scope Out: Findings:      The Z-line was found 38 cm from the incisors.      A mild Schatzki ring (acquired) was found at the gastroesophageal       junction. A TTS dilator was passed through the scope, but the lesion       could not be dilated. Dilation with a 15-16.5-18 mm balloon and an       18-19-20 mm balloon dilator was attempted to 20 mm. The dilation site       was examined and showed no change. Estimated blood loss: none.      The exam of the esophagus was otherwise normal.      Localized mildly erythematous mucosa without bleeding was found in the       gastric antrum.      The exam of the stomach was otherwise normal.      The examined duodenum was normal. Impression:               - Z-line, 38 cm from the incisors.                           - Mild Schatzki ring. Unable to dilate.                           - Erythematous mucosa in the antrum.                           -  Normal examined duodenum.                           - No specimens collected. Moderate Sedation:      N/A- Per Anesthesia Care Recommendation:           - Patient has a contact number available for                            emergencies. The signs and symptoms of potential                            delayed complications were discussed with the                            patient. Return to normal activities tomorrow.                            Written discharge instructions were provided to the                            patient.                           - Perform ambulatory esophageal manometry at                             appointment to be scheduled.                           - Resume previous diet.                           - Continue present medications. Procedure Code(s):        --- Professional ---                           [phone], Esophagogastroduodenoscopy, flexible,                            transoral; with transendoscopic balloon dilation of                            esophagus (less than 30 mm diameter) Diagnosis Code(s):        --- Professional ---                           R13.10, Dysphagia, unspecified                           K22.2, Esophageal obstruction                           K31.89, Other diseases of stomach and duodenum CPT copyright 2016 [employer]. All rights reserved. The codes documented in this report are preliminary and upon coder review may  be revised to meet current compliance requirements. Shirley Friar, MD 01/24/2017 11:47:57 AM This report has been  signed electronically. Number of Addenda: 0

## 2017-01-24 NOTE — H&P (Signed)
Date of Initial H&P: 01/18/17  History reviewed, patient examined, no change in status, stable for surgery.  

## 2017-01-24 NOTE — Transfer of Care (Signed)
Immediate Anesthesia Transfer of Care Note  Patient: [name]  Procedure(s) Performed: Procedure(s) with comments: ESOPHAGOGASTRODUODENOSCOPY (EGD) WITH PROPOFOL (N/A) - moved oer MD request BALLOON DILATION (N/A) COLONOSCOPY WITH PROPOFOL (N/A)  Patient Location: PACU  Anesthesia Type:MAC  Level of Consciousness: Patient easily awoken, sedated, comfortable, cooperative, following commands, responds to stimulation.   Airway & Oxygen Therapy: Patient spontaneously breathing, ventilating well, oxygen via simple oxygen mask.  Post-op Assessment: Report given to PACU RN, vital signs reviewed and stable, moving all extremities.   Post vital signs: Reviewed and stable.  Complications: No apparent anesthesia complications  Last Vitals:  Vitals:   01/24/17 0922 01/24/17 1147  BP: 135/66 117/68  Pulse: 75 76  Resp: 15 15  Temp: 37 C 36.5 C    Last Pain:  Vitals:   01/24/17 1147  TempSrc: Oral         Complications: No apparent anesthesia complications

## 2017-01-24 NOTE — Op Note (Signed)
[facility] Patient Name: [name] Procedure Date: 01/24/2017 MRN: [id number] Attending MD: Shirley Friar , MD Date of Birth: Jan 07, 1961 CSN: [id number] Age: 56 Admit Type: Inpatient Procedure:                Colonoscopy Indications:              Screening for colorectal malignant neoplasm, This                            is the patient's first colonoscopy Providers:                Shirley Friar, MD, Norman Clay, RN, Beryle Beams, Technician, Randon Goldsmith, CRNA Referring MD:              Medicines:                Propofol per Anesthesia, Monitored Anesthesia Care Complications:            No immediate complications. Estimated Blood Loss:     Estimated blood loss: none. Procedure:                Pre-Anesthesia Assessment:                           - Prior to the procedure, a History and Physical                            was performed, and patient medications and                            allergies were reviewed. The patient's tolerance of                            previous anesthesia was also reviewed. The risks                            and benefits of the procedure and the sedation                            options and risks were discussed with the patient.                            All questions were answered, and informed consent                            was obtained. Prior Anticoagulants: The patient has                            taken no previous anticoagulant or antiplatelet                            agents. ASA Grade Assessment: III - A patient with  severe systemic disease. After reviewing the risks                            and benefits, the patient was deemed in                            satisfactory condition to undergo the procedure.                           After obtaining informed consent, the colonoscope                            was passed under direct vision. Throughout the                             procedure, the patient's blood pressure, pulse, and                            oxygen saturations were monitored continuously. The                            [id number] ([id number]) scope was introduced through                            the anus and advanced to the the cecum, identified                            by appendiceal orifice and ileocecal valve. The                            colonoscopy was somewhat difficult due to                            significant looping and a tortuous colon.                            Successful completion of the procedure was aided by                            straightening and shortening the scope to obtain                            bowel loop reduction. The patient tolerated the                            procedure well. The quality of the bowel                            preparation was fair and fair but repeated                            irrigation led to a good and adequate prep. The  ileocecal valve, appendiceal orifice, and rectum                            were photographed. Scope In: 11:29:56 AM Scope Out: 11:41:24 AM Scope Withdrawal Time: 0 hours 7 minutes 24 seconds  Total Procedure Duration: 0 hours 11 minutes 28 seconds  Findings:      The perianal and digital rectal examinations were normal.      Internal hemorrhoids were found during retroflexion. The hemorrhoids       were small and Grade I (internal hemorrhoids that do not prolapse).      The exam was otherwise normal throughout the examined colon. Impression:               - Preparation of the colon was fair.                           - Internal hemorrhoids.                           - No specimens collected. Moderate Sedation:      N/A- Per Anesthesia Care Recommendation:           - Patient has a contact number available for                            emergencies. The signs and symptoms of potential                             delayed complications were discussed with the                            patient. Return to normal activities tomorrow.                            Written discharge instructions were provided to the                            patient.                           - Resume previous diet.                           - Repeat colonoscopy in 10 years for screening                            purposes.                           - Post procedure medication orders were given. Procedure Code(s):        --- Professional ---                           [phone], Colonoscopy, flexible; diagnostic, including                            collection of specimen(s) by brushing or washing,  when performed (separate procedure) Diagnosis Code(s):        --- Professional ---                           Z12.11, Encounter for screening for malignant                            neoplasm of colon                           K64.0, First degree hemorrhoids CPT copyright 2016 [employer]. All rights reserved. The codes documented in this report are preliminary and upon coder review may  be revised to meet current compliance requirements. Shirley Friar, MD 01/24/2017 11:50:59 AM This report has been signed electronically. Number of Addenda: 0

## 2017-01-26 ENCOUNTER — Encounter (HOSPITAL_COMMUNITY): Payer: Self-pay | Admitting: Gastroenterology

## 2017-02-05 ENCOUNTER — Other Ambulatory Visit: Payer: Self-pay | Admitting: Neurology

## 2017-05-02 NOTE — Addendum Note (Signed)
Addendum  created 05/02/17 1137 by Zurii Hewes, MD   Sign clinical note    

## 2017-11-02 ENCOUNTER — Ambulatory Visit: Payer: 59 | Admitting: Neurology

## 2017-11-02 ENCOUNTER — Encounter: Payer: Self-pay | Admitting: Neurology

## 2017-11-02 VITALS — BP 114/63 | HR 57

## 2017-11-02 DIAGNOSIS — G250 Essential tremor: Secondary | ICD-10-CM | POA: Diagnosis not present

## 2017-11-02 NOTE — Progress Notes (Signed)
[name]    Provider:  Dr Lucia [name] Referring Provider: Juluis RainierBarnes, [name] Primary Care Physician:  [name] RainierBarnes, [name]  CC: Tremor  Tremor is worse. She cant use her mouse, she can;t write, it is illegible. She has a complicated history, is on multiple drugs, could not tolerate increase in primidone or neurontin, BP too low for propranolol. Hesistate to try any other medication, she is also on lamictal. At this point have nothing else to offer her, recommend [facility] for [place] thal[facility] treatment or DBS or continuing as is with current treatment.  Interval update 10/28/2016: Her hands are still shaky. She can't eat without getting food on her shirt. She can;t take primidone, sleeping too much. She takes it three timed a day, does not want to take it all at night. Takes gabapentin three times a day.They help with the tremor and the pain. Discussed propranolol and clonazepam. Patient has low BP, would have to watch her BP and pulse very carefully, stop for any side effects especially lightheadedness, dizziness, chest pain, stay well hydrated take daly BP and pulse.   Interval update;Tremor is improved by the primidone and the gabapentin. But the tremor is still impairing her ability to write and her ability to ability to text. She is on neurontin and the primidone. Recent increase in primidone didn't help. And the primidone is making her tired. Her blood pressure is always low and this is likely why she hasn't tried propranolol. She is having memory problems. She is having word-finding difficulties.   HPI: [name] HiresMary [name] is a 56 y.o. female here as a referral from [name]. Zachery [name] for tremor. She has very complicated PMHx. DM 1 with complications of retinopathy and neuropathy and chronic renal insufficiency and gastroparesis and frozen shoulder, DKA, orthostatic hypotension, with insulin pump, neuropathy, bilateral foot drop, thyroid disease, HLD, asthma, depression, MGUS,  essential tremor. Sleep apnea does not use cpap  She has had tremors. For 5 years. It is worsening. In the hands. Sometimes they are so bad, husband has to hold her hands and she can't stop them even her whole arm shakes. Progressively, slowly getting worse. No caffeine. No coffee. Primidone helped in the past. Doesn't notice it in the mouth or legs. Has worsening neuropathy and was being followed by a neurologist who did a full evaluation. Does not drink alcohol. tremor interferes with eating and cutting food. Had MRi of the brain a few years ago which was negative. She has worsening memory problems. No FHx neuropathy or neurmuscular disease.     Review of Systems: Patient complains of symptoms per HPI as well as the following symptoms: tremor. Pertinent negatives and positives per HPI. All others negative.   Social History   Socioeconomic History  . Marital status: Married    Spouse name: [name]  . Number of children: 2  . Years of education: Ba  . Highest education level: Not on file  Social Needs  . Financial resource strain: Not on file  . Food insecurity - worry: Not on file  . Food insecurity - inability: Not on file  . Transportation needs - medical: Not on file  . Transportation needs - non-medical: Not on file  Occupational History  . Not on file  Tobacco Use  . Smoking status: Never Smoker  . Smokeless tobacco: Never Used  Substance and Sexual Activity  . Alcohol use: No    Alcohol/week: 0.0 oz  . Drug use: No  . Sexual activity: Not on file  Other Topics Concern  . Not on file  Social History Narrative   Lives at home with husband and daughter.   Caffeine use: 1 soda per day   Right handed    Family History  Problem Relation Age of Onset  . Parkinsonism Neg Hx   . Neuropathy Neg Hx     Past Medical History:  Diagnosis Date  . Adhesive capsulitis 2013   Bilateral shoulders  . Adhesive capsulitis of both shoulders 2013  . Anemia   . Cataract   .  Chronic kidney disease   . Depression   . Diabetes mellitus without complication (HCC)   . Diabetic retinopathy (HCC) 2010  . Diabetic retinopathy (HCC) 2010   Left eye, mild  . Essential tremor 2011   Bilateral hands  . Foot drop, bilateral 2007  . Foot fracture, left   . Gastroparesis 2009  . GERD (gastroesophageal reflux disease)   . Headache   . Hemorrhage 2011   Laser surgery x2  . High potassium 2012   Normal as of 2014  . Hypercholesterolemia 2010  . Hyperlipidemia   . Neuropathy   . Pneumonia   . Postnasal drip 2010  . Scratched cornea 2013   Left eye  . Sleep apnea    does not wear CPAP  . Stage 3 chronic kidney disease (HCC)   . Thyroid disease   . TMJ (temporomandibular joint disorder)   . Type 1 diabetes [facility](HCC)     Past Surgical History:  Procedure Laterality Date  . ABDOMINAL HYSTERECTOMY  2007  . APPENDECTOMY    . BALLOON DILATION N/A 01/24/2017   Procedure: BALLOON DILATION;  Surgeon: Charlott RakesVincent Schooler, [name];  Location: [facility];  Service: Endoscopy;  Laterality: N/A;  . CARPAL TUNNEL RELEASE Right 1990  . CATARACT EXTRACTION Bilateral 2008  . COLONOSCOPY  2007  . COLONOSCOPY WITH PROPOFOL N/A 01/24/2017   Procedure: COLONOSCOPY WITH PROPOFOL;  Surgeon: Charlott RakesVincent Schooler, [name];  Location: [facility];  Service: Endoscopy;  Laterality: N/A;  . ESOPHAGOGASTRODUODENOSCOPY (EGD) WITH PROPOFOL N/A 01/24/2017   Procedure: ESOPHAGOGASTRODUODENOSCOPY (EGD) WITH PROPOFOL;  Surgeon: Charlott RakesVincent Schooler, [name];  Location: [facility];  Service: Endoscopy;  Laterality: N/A;  moved oer [name] request  . EYE SURGERY Left 2012   Pars plana viterctomy, membrane peel, endolaser  . ROOT CANAL  2007  . ROOT CANAL  2010  . Thorat dilation  2007  . Throat dilation  2007  . Throat dilation  2010, 0ct 2011   Sep 2010, Nov 2010, 2014  . TONSILLECTOMY  1971  . Trigger fingers  2008  . WISDOM TOOTH EXTRACTION  1977    Current Outpatient Medications  Medication Sig Dispense Refill    . albuterol (PROVENTIL HFA;VENTOLIN HFA) 108 (90 BASE) MCG/ACT inhaler Inhale 1 puff into the lungs as needed for wheezing or shortness of breath.    Marland Kitchen. aspirin 81 MG chewable tablet Chew 81 mg by mouth daily.    . Carboxymethylcellul-Glycerin (REFRESH OPTIVE OP) Apply 1 drop to eye as needed.    . Choline Fenofibrate (FENOFIBRIC ACID) 45 MG CPDR Take 45 mg by mouth 3 (three) times daily.    Marland Kitchen. CREON 36000 UNITS CPEP capsule Take 36,000 Units by mouth daily. Take two capsules with snacks and three capsules with meals    . cycloSPORINE (RESTASIS) 0.05 % ophthalmic emulsion 1 drop 2 (two) times daily.    Marland Kitchen. escitalopram (LEXAPRO) 10 MG tablet Take 30 mg by mouth 2 (two) times daily.     .Marland Kitchen  estradiol (ESTRACE) 2 MG tablet Take 2 mg by mouth daily.    . famotidine (PEPCID) 10 MG tablet Take 10 mg by mouth 2 (two) times daily.    Marland Kitchen gabapentin (NEURONTIN) 300 MG capsule TAKE 3 CAPSULES BY MOUTH THREE TIMES DAILY (Patient taking differently: TAKE 1 CAPSULE IN THE MORNING, TAKE 1 CAPSULE AT NOON, FOLLOWED BY THREE CAPSULES AT NIGHT) 270 capsule 5  . Insulin Human (INSULIN PUMP) SOLN Inject 1 each into the skin. Insulin pump ANIMAS VIBE with humalog Works with DEXCOM 4 CGM    . insulin lispro (HUMALOG) 100 UNIT/ML injection Has with insulin pump    . lamoTRIgine (LAMICTAL) 150 MG tablet TAKE 1 TABLET BY MOUTH TWICE DAILY 180 tablet 3  . lansoprazole (PREVACID) 30 MG capsule Take 30 mg by mouth 2 (two) times daily before a meal.    . levocetirizine (XYZAL) 5 MG tablet Take 5 mg by mouth 2 (two) times daily. Takes OTC version per patient. Xyzal is not covered  By her insurance.    Marland Kitchen levothyroxine (SYNTHROID, LEVOTHROID) 112 MCG tablet Take 112 mcg by mouth daily.    . mometasone (NASONEX) 50 MCG/ACT nasal spray Place 2 sprays into the nose daily. OTC    . montelukast (SINGULAIR) 10 MG tablet Take 10 mg by mouth at bedtime.    . Multiple Vitamins-Minerals (MULTIVITAMIN GUMMIES ADULT PO) Take 2 Doses by mouth  daily.    Letta Pate DELICA LANCETS 33G MISC     . ONETOUCH VERIO test strip     . primidone (MYSOLINE) 50 MG tablet 1 tablet in the morning, 1 at noon, 1.5 tablets in the evening 330 tablet 3  . rosuvastatin (CRESTOR) 20 MG tablet Take 20 mg by mouth daily.    . Simethicone (GAS-X EXTRA STRENGTH PO) Take 1 tablet by mouth as needed.     No current facility-administered medications for this visit.     Allergies as of 11/02/2017 - Review Complete 11/02/2017  Allergen Reaction Noted  . Codeine  02/19/2015  . Food  07/15/2014  . Penicillins  02/19/2015    Vitals: BP 114/63 (BP Location: Right Arm, Patient Position: Sitting)   Pulse (!) 57  Last Weight:  Wt Readings from Last 1 Encounters:  01/24/17 163 lb 3 oz (74 kg)   Last Height:   Ht Readings from Last 1 Encounters:  01/24/17 5\' 4"  (1.626 m)       Assessment/Plan: [name] is a 56yo. female here as a referral from [name]. Zachery Dauer for tremor. She has very complicated PMHx. 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 p/w worsening essential tremor and cannot tolerate anymore primidone or neurontin. Untreated sleep apnea, discussed sequelae and encouraged treatment, declined.   - essential tremor: she is on primidone and neurontin. Worsening, cannot tolerate more primidone or neurontin. Could not tolerate propanolol. Already of multiple AEDs and polypharmacy. At this point have nothing else to offer her, recommend [facility] for [place] thalamic treatment or DBS or continuing as is with current treatment.  - Discussed weighted silverware  - perceived memory loss; MMSE normal. Likely secondary to polypharmacy, general medicaal conditions. Reported anxiety/depression  - for sleep apnea, encouraged f/u  and compliance with cpap. Can cause h/a,cognitive changes, increased risk of stroke   - Could also try clonazepam low dose, discussed side  effects but given her memory complaints, other current sedating medications this is an unlikely option. On multiple AEDs, hesitate to try Topiramate.   F/u with [facility] and [name]. Zachery Dauer.    Naomie Dean, [name]  [facility] [address] [place], [place] [address]  Phone [phone] Fax [phone]  A total of 15 minutes was spent face-to-face with this patient. Over half this time was spent on counseling patient on the ET diagnosis and different diagnostic and therapeutic options available.

## 2017-11-02 NOTE — Patient Instructions (Addendum)
[facility].   Essential Tremor A tremor is trembling or shaking that you cannot control. Most tremors affect the hands or arms. Tremors can also affect the head, vocal cords, face, and other parts of the body. Essential tremor is a tremor without a known cause. What are the causes? Essential tremor has no known cause. What increases the risk? You may be at greater risk of essential tremor if:  You have a family member with essential tremor.  You are age 55 or older.  You take certain medicines.  What are the signs or symptoms? The main sign of a tremor is uncontrolled and unintentional rhythmic shaking of a body part.  You may have difficulty eating with a spoon or fork.  You may have difficulty writing.  You may nod your head up and down or side to side.  You may have a quivering voice.  Your tremors:  May get worse over time.  May come and go.  May be more noticeable on one side of your body.  May get worse due to stress, fatigue, caffeine, and extreme heat or cold.  How is this diagnosed? Your health care provider can diagnose essential tremor based on your symptoms, medical history, and a physical examination. There is no single test to diagnose an essential tremor. However, your health care provider may perform a variety of tests to rule out other conditions. Tests may include:  Blood and urine tests.  Imaging studies of your brain, such as: ? CT scan. ? MRI.  A test that measures involuntary muscle movement (electromyogram).  How is this treated? Your tremors may go away without treatment. Mild tremors may not need treatment if they do not affect your day-to-day life. Severe tremors may need to be treated using one or a combination of the following options:  Medicines. This may include medicine that is injected.  Lifestyle changes.  Physical therapy.  Follow these instructions at home:  Take medicines only as directed by your  health care provider.  Limit alcohol intake to no more than 1 drink per day for nonpregnant women and 2 drinks per day for men. One drink equals 12 oz of beer, 5 oz of wine, or 1 oz of hard liquor.  Do not use any tobacco products, including cigarettes, chewing tobacco, or electronic cigarettes. If you need help quitting, ask your health care provider.  Take medicines only as directed by your health care provider.  Avoid extreme heat or cold.  Limit the amount of caffeine you consumeas directed by your health care provider.  Try to get eight hours of sleep each night.  Find ways to manage your stress, such as meditation or yoga.  Keep all follow-up visits as directed by your health care provider. This is important. This includes any physical therapy visits. Contact a health care provider if:  You experience any changes in the location or intensity of your tremors.  You start having a tremor after starting a new medicine.  You have tremor with other symptoms such as: ? Numbness. ? Tingling. ? Pain. ? Weakness.  Your tremor gets worse.  Your tremor interferes with your daily life. This information is not intended to replace advice given to you by your health care provider. Make sure you discuss any questions you have with your health care provider. Document Released: 12/06/2014 Document Revised: 04/22/2016 Document Reviewed: 05/13/2014 Elsevier Interactive Patient Education  [employer]2018 [employer] Inc.

## 2017-11-04 ENCOUNTER — Encounter: Payer: Managed Care, Other (non HMO) | Attending: Internal Medicine | Admitting: *Deleted

## 2017-11-04 DIAGNOSIS — E1022 Type 1 diabetes mellitus with diabetic chronic kidney disease: Secondary | ICD-10-CM | POA: Diagnosis not present

## 2017-11-04 DIAGNOSIS — Z713 Dietary counseling and surveillance: Secondary | ICD-10-CM | POA: Diagnosis not present

## 2017-11-04 DIAGNOSIS — E108 Type 1 diabetes mellitus with unspecified complications: Secondary | ICD-10-CM
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
## 2017-11-15 ENCOUNTER — Ambulatory Visit: Payer: Managed Care, Other (non HMO) | Admitting: *Deleted

## 2017-12-01 ENCOUNTER — Other Ambulatory Visit (HOSPITAL_BASED_OUTPATIENT_CLINIC_OR_DEPARTMENT_OTHER): Payer: Managed Care, Other (non HMO)

## 2017-12-01 ENCOUNTER — Ambulatory Visit (HOSPITAL_BASED_OUTPATIENT_CLINIC_OR_DEPARTMENT_OTHER): Payer: Managed Care, Other (non HMO) | Admitting: Hematology & Oncology

## 2017-12-01 ENCOUNTER — Other Ambulatory Visit: Payer: Self-pay

## 2017-12-01 VITALS — BP 109/88 | HR 66 | Temp 98.3°F | Resp 20

## 2017-12-01 DIAGNOSIS — E1140 Type 2 diabetes mellitus with diabetic neuropathy, unspecified: Secondary | ICD-10-CM

## 2017-12-01 DIAGNOSIS — Z794 Long term (current) use of insulin: Secondary | ICD-10-CM | POA: Diagnosis not present

## 2017-12-01 DIAGNOSIS — K319 Disease of stomach and duodenum, unspecified: Secondary | ICD-10-CM

## 2017-12-01 DIAGNOSIS — Z90710 Acquired absence of both cervix and uterus: Secondary | ICD-10-CM

## 2017-12-01 DIAGNOSIS — D569 Thalassemia, unspecified: Secondary | ICD-10-CM

## 2017-12-01 DIAGNOSIS — R718 Other abnormality of red blood cells: Secondary | ICD-10-CM | POA: Diagnosis not present

## 2017-12-01 DIAGNOSIS — H3500 Unspecified background retinopathy: Secondary | ICD-10-CM

## 2017-12-01 LAB — CBC WITH DIFFERENTIAL (CANCER CENTER ONLY)
BASO#: 0.1 10*3/uL (ref 0.0–0.2)
BASO%: 0.7 % (ref 0.0–2.0)
EOS ABS: 0.5 10*3/uL (ref 0.0–0.5)
EOS%: 5.1 % (ref 0.0–7.0)
HEMATOCRIT: 37.2 % (ref 34.8–46.6)
HGB: 12.1 g/dL (ref 11.6–15.9)
LYMPH#: 3 10*3/uL (ref 0.9–3.3)
LYMPH%: 29.7 % (ref 14.0–48.0)
MCH: 32.1 pg (ref 26.0–34.0)
MCHC: 32.5 g/dL (ref 32.0–36.0)
MCV: 99 fL (ref 81–101)
MONO#: 0.5 10*3/uL (ref 0.1–0.9)
MONO%: 5 % (ref 0.0–13.0)
NEUT#: 6.1 10*3/uL (ref 1.5–6.5)
NEUT%: 59.5 % (ref 39.6–80.0)
PLATELETS: 239 10*3/uL (ref 145–400)
RBC: 3.77 10*6/uL (ref 3.70–5.32)
RDW: 12.9 % (ref 11.1–15.7)
WBC: 10.2 10*3/uL — ABNORMAL HIGH (ref 3.9–10.0)

## 2017-12-01 LAB — CHCC SATELLITE - SMEAR

## 2017-12-01 NOTE — Progress Notes (Signed)
Referral MD  Reason for Referral: Abnormal hemoglobin; insulin-dependent diabetes  Chief Complaint  Patient presents with  . New Patient (Initial Visit)  : I was told that I had a abnormal blood hemoglobin.  HPI: Ms. Dun is a very nice 57 year old white female.  She comes in with her husband.  They  moved here from [facility].  She has bad diabetes.  She is insulin-dependent.  She has had diabetes for about 27 years.  She does have retinopathy.  She has some diabetic nephropathy.  She has there is a neurological issue with tremors.  She sees Dr. Sharl Ma of endocrinology.  I am still not sure why he would run a hemoglobin evaluation on her.  I have to give him credit for doing this.  Surprisingly, she had an abnormal hemoglobin electrophoresis.  She had 75% Hemoglobin A.  She had 23% of a hemoglobin variant which is recorded as Hemoglobin I.  I must say than 25 years of doing this, I have never heard of Hemoglobin I.  I really do not know if this is for real.  My laboratory coordinator did some research.  He found one family with Hemoglobin I.  This was an African-American family.  She clearly does not have any African-American heritage.  She does not know of any blood problem in the family.  She does not even have any anemia.  Going through all of her labs.  She was minimally anemic back 3 years ago.  Her hemoglobin was 11.9.  She had a normal MCV.  Her big problem has been this diabetes.  She has multiple, multiple other health problems.  She is had no bleeding.  She had a hysterectomy.  Her ovaries have been removed.  She is up-to-date with her mammogram.  She has had no weight loss or weight gain.  She does have thyroid issues.  She apparently is going to see a tremor specialist at [facility] tomorrow.  She has diabetic gastropathy.  It sounds like she has dumping syndrome.  Currently, her performance status is ECOG 3.    Past Medical History:  Diagnosis Date  .  Adhesive capsulitis 2013   Bilateral shoulders  . Adhesive capsulitis of both shoulders 2013  . Anemia   . Cataract   . Chronic kidney disease   . Depression   . Diabetes mellitus without complication (HCC)   . Diabetic retinopathy (HCC) 2010  . Diabetic retinopathy (HCC) 2010   Left eye, mild  . Essential tremor 2011   Bilateral hands  . Foot drop, bilateral 2007  . Foot fracture, left   . Gastroparesis 2009  . GERD (gastroesophageal reflux disease)   . Headache   . Hemorrhage 2011   Laser surgery x2  . High potassium 2012   Normal as of 2014  . Hypercholesterolemia 2010  . Hyperlipidemia   . Neuropathy   . Pneumonia   . Postnasal drip 2010  . Scratched cornea 2013   Left eye  . Sleep apnea    does not wear CPAP  . Stage 3 chronic kidney disease (HCC)   . Thyroid disease   . TMJ (temporomandibular joint disorder)   . Type 1 diabetes (HCC)   :  Past Surgical History:  Procedure Laterality Date  . ABDOMINAL HYSTERECTOMY  2007  . APPENDECTOMY    . BALLOON DILATION N/A 01/24/2017   Procedure: BALLOON DILATION;  Surgeon: Charlott Rakes, MD;  Location: [facility];  Service: Endoscopy;  Laterality: N/A;  .  CARPAL TUNNEL RELEASE Right 1990  . CATARACT EXTRACTION Bilateral 2008  . COLONOSCOPY  2007  . COLONOSCOPY WITH PROPOFOL N/A 01/24/2017   Procedure: COLONOSCOPY WITH PROPOFOL;  Surgeon: Charlott RakesVincent Schooler, MD;  Location: [facility];  Service: Endoscopy;  Laterality: N/A;  . ESOPHAGOGASTRODUODENOSCOPY (EGD) WITH PROPOFOL N/A 01/24/2017   Procedure: ESOPHAGOGASTRODUODENOSCOPY (EGD) WITH PROPOFOL;  Surgeon: Charlott RakesVincent Schooler, MD;  Location: [facility];  Service: Endoscopy;  Laterality: N/A;  moved oer MD request  . EYE SURGERY Left 2012   Pars plana viterctomy, membrane peel, endolaser  . ROOT CANAL  2007  . ROOT CANAL  2010  . Thorat dilation  2007  . Throat dilation  2007  . Throat dilation  2010, 0ct 2011   Sep 2010, Nov 2010, 2014  . TONSILLECTOMY  1971  .  Trigger fingers  2008  . WISDOM TOOTH EXTRACTION  1977  :   Current Outpatient Medications:  .  albuterol (PROVENTIL HFA;VENTOLIN HFA) 108 (90 BASE) MCG/ACT inhaler, Inhale 1 puff into the lungs as needed for wheezing or shortness of breath., Disp: , Rfl:  .  aspirin 81 MG chewable tablet, Chew 81 mg by mouth daily., Disp: , Rfl:  .  Carboxymethylcellul-Glycerin (REFRESH OPTIVE OP), Apply 1 drop to eye as needed., Disp: , Rfl:  .  Choline Fenofibrate (FENOFIBRIC ACID) 45 MG CPDR, Take 45 mg by mouth 3 (three) times daily., Disp: , Rfl:  .  CREON 36000 UNITS CPEP capsule, Take 36,000 Units by mouth daily. Take two capsules with snacks and three capsules with meals, Disp: , Rfl:  .  cycloSPORINE (RESTASIS) 0.05 % ophthalmic emulsion, 1 drop 2 (two) times daily., Disp: , Rfl:  .  escitalopram (LEXAPRO) 10 MG tablet, Take 30 mg by mouth 2 (two) times daily. , Disp: , Rfl:  .  estradiol (ESTRACE) 2 MG tablet, Take 2 mg by mouth daily., Disp: , Rfl:  .  famotidine (PEPCID) 10 MG tablet, Take 10 mg by mouth 2 (two) times daily., Disp: , Rfl:  .  gabapentin (NEURONTIN) 300 MG capsule, TAKE 3 CAPSULES BY MOUTH THREE TIMES DAILY (Patient taking differently: TAKE 1 CAPSULE IN THE MORNING, TAKE 1 CAPSULE AT NOON, FOLLOWED BY THREE CAPSULES AT NIGHT), Disp: 270 capsule, Rfl: 5 .  Insulin Human (INSULIN PUMP) SOLN, Inject 1 each into the skin. Insulin pump ANIMAS VIBE with humalog Works with DEXCOM 4 CGM, Disp: , Rfl:  .  insulin lispro (HUMALOG) 100 UNIT/ML injection, Has with insulin pump, Disp: , Rfl:  .  lamoTRIgine (LAMICTAL) 150 MG tablet, TAKE 1 TABLET BY MOUTH TWICE DAILY, Disp: 180 tablet, Rfl: 3 .  lansoprazole (PREVACID) 30 MG capsule, Take 30 mg by mouth 2 (two) times daily before a meal., Disp: , Rfl:  .  levocetirizine (XYZAL) 5 MG tablet, Take 5 mg by mouth 2 (two) times daily. Takes OTC version per patient. Xyzal is not covered  By her insurance., Disp: , Rfl:  .  levothyroxine (SYNTHROID,  LEVOTHROID) 112 MCG tablet, Take 112 mcg by mouth daily., Disp: , Rfl:  .  mometasone (NASONEX) 50 MCG/ACT nasal spray, Place 2 sprays into the nose daily. OTC, Disp: , Rfl:  .  montelukast (SINGULAIR) 10 MG tablet, Take 10 mg by mouth at bedtime., Disp: , Rfl:  .  Multiple Vitamins-Minerals (MULTIVITAMIN GUMMIES ADULT PO), Take 2 Doses by mouth daily., Disp: , Rfl:  .  ONETOUCH DELICA LANCETS 33G MISC, , Disp: , Rfl:  .  ONETOUCH VERIO test strip, ,  Disp: , Rfl:  .  primidone (MYSOLINE) 50 MG tablet, 1 tablet in the morning, 1 at noon, 1.5 tablets in the evening, Disp: 330 tablet, Rfl: 3 .  rosuvastatin (CRESTOR) 20 MG tablet, Take 20 mg by mouth daily., Disp: , Rfl:  .  Simethicone (GAS-X EXTRA STRENGTH PO), Take 1 tablet by mouth as needed., Disp: , Rfl: :  :  Allergies  Allergen Reactions  . Codeine   . Food     MSG and Lactose Intolerant  . Penicillins     She states she can have penicillin derivatives  :  Family History  Problem Relation Age of Onset  . Parkinsonism Neg Hx   . Neuropathy Neg Hx   :  Social History   Socioeconomic History  . Marital status: Married    Spouse name: Theron Arista  . Number of children: 2  . Years of education: Ba  . Highest education level: Not on file  Social Needs  . Financial resource strain: Not on file  . Food insecurity - worry: Not on file  . Food insecurity - inability: Not on file  . Transportation needs - medical: Not on file  . Transportation needs - non-medical: Not on file  Occupational History  . Not on file  Tobacco Use  . Smoking status: Never Smoker  . Smokeless tobacco: Never Used  Substance and Sexual Activity  . Alcohol use: No    Alcohol/week: 0.0 oz  . Drug use: No  . Sexual activity: Not on file  Other Topics Concern  . Not on file  Social History Narrative   Lives at home with husband and daughter.   Caffeine use: 1 soda per day   Right handed  :  Pertinent items are noted in HPI.  Exam: Chronically  ill-appearing white female.  She is in a wheelchair.  Her vital signs show a temperature of 98.3.  Pulse 66.  Blood pressure 109/88.  Weight was not taken.  Head neck exam shows no ocular or oral lesions.  She has no scleral icterus.  There is no adenopathy in her neck.  She has no oral lesions.  Thyroid is nonpalpable.  Lungs are clear.  Cardiac exam regular rate and rhythm with no murmurs, rubs or bruits.  Abdomen is soft.  She has decent bowel sounds.  There is no obvious hepatosplenomegaly.  Axillary exam shows no bilateral axillary adenopathy.  Extremities shows some trace edema in her legs.  She has good range of motion of her joints.  Neurological exam does show this tremor. @IPVITALS @   Recent Labs    12/01/17 1351  WBC 10.2*  HGB 12.1  HCT 37.2  PLT 239   No results for input(s): NA, K, CL, CO2, GLUCOSE, BUN, CREATININE, CALCIUM in the last 72 hours.  Blood smear review: Normochromic and normocytic population of red blood cells.  She has no nucleated red blood cells.  I see no inclusion bodies.  I see no teardrop cells.  I see no schistocytes.  There is no ovalocytes.  I see no rouleaux formation.  White cells appear normal in morphology and maturation.  I see no hypersegmented polys.  There are no atypical myeloid or lymphoid cells.  Platelets are adequate in number and size.  Pathology: None    Assessment and Plan: Ms. Varney is a 57 year old white female.  Her big problem is diabetes.  I am still not sure at all if she truly has a blood problem.  Even if  she does have a hemoglobinopathy, I just do not think this is going to cause her problems.  She is not even anemic.  It would be nice to get a ultrasound of her abdomen.  That way, I can see if there is any obvious splenomegaly.  I do not think she needs folic acid.  I just cannot imagine that this hemoglobinopathy is ever going to cause her problems.  Again, I am just never heard of Hemoglobin I.  Is not a my hematology  book.  I will see if I can do any other investigation.  I am not sure if we have to really get her back to the office.  I suppose it would not be a bad idea just to follow-up with her.  I spent about 45 minutes with she and her husband.  They are so much fun to talk to.  Her

## 2017-12-02 LAB — ERYTHROPOIETIN: Erythropoietin: 16.2 m[IU]/mL (ref 2.6–18.5)

## 2017-12-02 LAB — IRON AND TIBC
%SAT: 14 % — ABNORMAL LOW (ref 21–57)
IRON: 40 ug/dL — AB (ref 41–142)
TIBC: 275 ug/dL (ref 236–444)
UIBC: 235 ug/dL (ref 120–384)

## 2017-12-02 LAB — FERRITIN: FERRITIN: 57 ng/mL (ref 9–269)

## 2017-12-02 LAB — RETICULOCYTES: RETICULOCYTE COUNT: 2 % (ref 0.6–2.6)

## 2017-12-05 ENCOUNTER — Ambulatory Visit (HOSPITAL_BASED_OUTPATIENT_CLINIC_OR_DEPARTMENT_OTHER)
Admission: RE | Admit: 2017-12-05 | Discharge: 2017-12-05 | Disposition: A | Discharge status: Home / Self Care | Payer: Managed Care, Other (non HMO) | Source: Ambulatory Visit | Attending: Hematology & Oncology | Admitting: Hematology & Oncology

## 2017-12-05 ENCOUNTER — Telehealth: Payer: Self-pay | Admitting: *Deleted

## 2017-12-05 DIAGNOSIS — D569 Thalassemia, unspecified: Secondary | ICD-10-CM

## 2017-12-05 DIAGNOSIS — K769 Liver disease, unspecified: Secondary | ICD-10-CM | POA: Insufficient documentation

## 2017-12-05 LAB — HEMOCHROMATOSIS DNA-PCR(C282Y,H63D)

## 2017-12-05 NOTE — Telephone Encounter (Addendum)
Patient is aware of results  ----- Message from [name] MachoPeter R Ennever, MD sent at 12/05/2017  1:27 PM EST ----- Call - the ultrasound looks ok!!  Spleen is NOT enlarged!!!  [name]

## 2017-12-06 LAB — HEMOGLOBINOPATHY EVALUATION
HEMOGLOBIN F QUANTITATION: 0 % (ref 0.0–2.0)
HGB C: 0 %
HGB S: 0 %
HGB VARIANT: 0 %
Hemoglobin A2 Quantitation: 2.4 % (ref 1.8–3.2)
Hgb A: 97.6 % (ref 96.4–98.8)

## 2017-12-07 ENCOUNTER — Telehealth: Payer: Self-pay | Admitting: *Deleted

## 2017-12-07 NOTE — Telephone Encounter (Addendum)
Patient is aware of results  ----- Message from Josph MachoPeter R Ennever, MD sent at 12/05/2017  4:47 PM EST ----- Call - NO problems with any iron disorder!!!  Cindee LamePete

## 2017-12-29 ENCOUNTER — Ambulatory Visit: Payer: Managed Care, Other (non HMO) | Admitting: Hematology & Oncology

## 2017-12-29 ENCOUNTER — Other Ambulatory Visit: Payer: Managed Care, Other (non HMO)

## 2018-02-12 ENCOUNTER — Other Ambulatory Visit: Payer: Self-pay | Admitting: Neurology

## 2018-02-12 DIAGNOSIS — G250 Essential tremor: Secondary | ICD-10-CM

## 2018-02-20 ENCOUNTER — Other Ambulatory Visit: Payer: Self-pay | Admitting: Family Medicine

## 2018-02-20 DIAGNOSIS — Z1231 Encounter for screening mammogram for malignant neoplasm of breast: Secondary | ICD-10-CM

## 2018-02-27 ENCOUNTER — Telehealth: Payer: Self-pay | Admitting: Neurology

## 2018-02-27 MED ORDER — PRIMIDONE 50 MG PO TABS: ORAL_TABLET | ORAL | 0 refills | Status: DC

## 2018-02-27 MED ORDER — PRIMIDONE 50 MG PO TABS: ORAL_TABLET | ORAL | 3 refills | Status: DC

## 2018-02-27 NOTE — Telephone Encounter (Signed)
Patient requesting refill of primidone (MYSOLINE) 50 MG tablet called to [facility] on [address]. She would like a 30 day refill and she is completely out of medication.

## 2018-02-27 NOTE — Addendum Note (Signed)
Addended by: Bertram SavinULBERTSON, BETHANY L on: 02/27/2018 11:19 AM   Modules accepted: Orders

## 2018-02-27 NOTE — Telephone Encounter (Signed)
Dr. Lucia GaskinsAhern has authorized a refill of Primidone for 1 year. E-scribed refill to [facility] [address]. Called pt and let her know. She requested that a one month supply be sent to [facility] and then the year [facility]. Pt very appreciative. RN changed e-prescription to [facility] for one month supply and [facility] with a year supply. Called [facility] and clarified that prescription to be filled is only one month supply. They verbalized understanding and appreciation.

## 2018-03-15 ENCOUNTER — Ambulatory Visit
Admission: RE | Admit: 2018-03-15 | Discharge: 2018-03-15 | Disposition: A | Discharge status: Home / Self Care | Payer: Managed Care, Other (non HMO) | Source: Ambulatory Visit | Attending: Family Medicine | Admitting: Family Medicine

## 2018-03-15 DIAGNOSIS — Z1231 Encounter for screening mammogram for malignant neoplasm of breast: Secondary | ICD-10-CM

## 2019-01-31 ENCOUNTER — Other Ambulatory Visit: Payer: Self-pay | Admitting: *Deleted

## 2019-01-31 DIAGNOSIS — G250 Essential tremor: Secondary | ICD-10-CM

## 2019-01-31 MED ORDER — GABAPENTIN 300 MG PO CAPS: 300.0000 mg | ORAL_CAPSULE | Freq: Three times a day (TID) | ORAL | 1 refills | Status: DC

## 2019-01-31 NOTE — Telephone Encounter (Signed)
Received Neurontin refill request. Ok to give 6 months gabapentin refill per Dr. Lucia Gaskins and schedule f/u with Amy. Called pt & LVM asking for call back. Called other number on file, husband Theron Arista (on [name]) and discussed appt would be needed for continued refills however we'd be happy to give her enough to get her through the appt. I scheduled appt for Thurs March 01, 2019 @ 3:30 arrival 15-30 minutes prior. He verbalized appreciation.   Placed verbal order for 6 month refill per Dr. Lucia Gaskins.

## 2019-03-01 ENCOUNTER — Telehealth: Payer: Self-pay | Admitting: Neurology

## 2019-03-01 ENCOUNTER — Encounter: Payer: Self-pay | Admitting: *Deleted

## 2019-03-01 ENCOUNTER — Ambulatory Visit (INDEPENDENT_AMBULATORY_CARE_PROVIDER_SITE_OTHER): Payer: 59 | Admitting: Family Medicine

## 2019-03-01 ENCOUNTER — Other Ambulatory Visit: Payer: Self-pay

## 2019-03-01 ENCOUNTER — Telehealth: Payer: Self-pay | Admitting: *Deleted

## 2019-03-01 DIAGNOSIS — F444 Conversion disorder with motor symptom or deficit: Secondary | ICD-10-CM

## 2019-03-01 NOTE — Telephone Encounter (Signed)
Pt called in and stated she gave consent for a virtual visit and still hasnt received an email

## 2019-03-01 NOTE — Telephone Encounter (Signed)
Spoke to pt, reiterated video visit on her smartphone today at 1530 with Amy Lomax/NP. Went over allergies, medications, history. I relayed as well that stating that she needs to download webex when she receives the link and follow directions.   Pt understands that although there may be some limitations with this type of visit, we will take all precautions to reduce any security or privacy concerns.  Pt understands that this will be treated like an in office visit and we will file with pt's insurance, and there may be a patient responsible charge related to this service.  Pt understands that the [employer] webex software must be downloaded and operational on the device pt plans to use for the visit. She will call back if issues with downloading program.

## 2019-03-01 NOTE — Progress Notes (Signed)
PATIENT: [name] DOB: Oct 06, 1961  REASON FOR VISIT: follow up HISTORY FROM: patient  Virtual Visit via Telephone Note  I connected with [name] on 03/05/19 at  3:30 PM EDT by telephone and verified that I am speaking with the correct person using two identifiers.   I discussed the limitations, risks, security and privacy concerns of performing an evaluation and management service by telephone and the availability of in person appointments. I also discussed with the patient that there may be a patient responsible charge related to this service. The patient expressed understanding and agreed to proceed.   History of Present Illness:  03/05/19 [name] is a 58 y.o. female for follow up.  She reports that her tremor continues to have adverse effects on her daily activities.  She reports that her husband will hold her hand as she takes her medications placed in pudding.  She is unable to type on her computer.  She continues primidone 50 mg in the morning, 50 mg at noon and 75 mg in the evenings.  She is taking gabapentin 300 mg 3 times daily.  At her last visit in December 2018 she reported that her tremor was worse.  She was unable to write.  She has a very complicated history and is on multiple medications.  We had tried to increase primidone but she was unable to tolerate this.  We have also tried to increase Neurontin however side effects limited therapy.  Her blood pressures are always fairly low.  Propranolol not an option at this time.  She is taking Lamictal as well.  Dr. Daisy Blossom had mentioned to her that maybe we should consider a referral to [facility] for ultrasound or deep brain stimulation to augment current therapy.  She was evaluated and told that "the type of tremor she had was not treatable."  She was then seen by a neuro physiologist who was uncertain on how to treat her tremor.  She has not been seen since. She was advised to see neuropsychology for formal assessment  of functional neurological disorder with abnormal movement.    History (copied from dr Trevor Mace note on 11/02/2017)  Tremor is worse. She cant use her mouse, she can;t write, it is illegible. She has a complicated history, is on multiple drugs, could not tolerate increase in primidone or neurontin, BP too low for propranolol. Hesistate to try any other medication, unable to type on a computer.  Also on lamictal. At this point have nothing else to offer her, recommend [facility] for [place] thalamic treatment or DBS or continuing as is with current treatment.  Interval update 10/28/2016: Her hands are still shaky. She can't eat without getting food on her shirt. She can;t take primidone, sleeping too much. She takes it three timed a day, does not want to take it all at night. Takes gabapentin three times a day.They help with the tremor and the pain. Discussed propranolol and clonazepam.Patient has low BP, would have to watch her BP and pulse very carefully, stop for any side effects especially lightheadedness, dizziness, chest pain, stay well hydrated take daly BP and pulse.  Interval update;Tremor is improved by the primidone and the gabapentin. But the tremor is still impairing her ability to write and her ability to ability to text. She is on neurontin and the primidone. Recent increase in primidone didn't help. And the primidone is making her tired. Her blood pressure is always low and this is likely why she  hasn't tried propranolol. She is having memory problems. She is having word-finding difficulties.   HPI: [name] is a 58 y.o. female here as a referral from Dr. Zachery Dauer for tremor. She has very complicated PMHx. DM 1 with complications of retinopathy and neuropathy and chronic renal insufficiency and gastroparesis and frozen shoulder, DKA, orthostatic hypotension, with insulin pump, neuropathy, bilateral foot drop, thyroid disease, HLD, asthma, depression, MGUS, essential tremor. Sleep apnea  does not use cpap  She has had tremors. For 5 years. It is worsening. In the hands. Sometimes they are so bad, husband has to hold her hands and she can't stop them even her whole arm shakes. Progressively, slowly getting worse. No caffeine. No coffee. Primidone helped in the past. Doesn't notice it in the mouth or legs. Has worsening neuropathy and was being followed by a neurologist who did a full evaluation. Does not drink alcohol. tremor interferes with eating and cutting food. Had MRi of the brain a few years ago which was negative. She has worsening memory problems. No FHx neuropathy or neurmuscular disease.   Observations/Objective:  Generalized: Well developed, in no acute distress  Mentation: Alert oriented to time, place, history taking. Follows all commands speech and language fluent   Assessment and Plan:  58 y.o. year old female  has a past medical history of Adhesive capsulitis (2013), Adhesive capsulitis of both shoulders (2013), Anemia, Cataract, Chronic kidney disease, Depression, Diabetes mellitus without complication (HCC), Diabetic retinopathy (HCC) (2010), Diabetic retinopathy (HCC) (2010), Essential tremor (2011), Foot drop, bilateral (2007), Foot fracture, left, Gastroparesis (2009), GERD (gastroesophageal reflux disease), Headache, Hemorrhage (2011), High potassium (2012), Hypercholesterolemia (2010), Hyperlipidemia, Neuropathy, Pneumonia, Postnasal drip (2010), Scratched cornea (2013), Sleep apnea, Stage 3 chronic kidney disease (HCC), Thyroid disease, TMJ (temporomandibular joint disorder), and Type 1 diabetes (HCC). with    ICD-10-CM   1. 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  Tremor does seem to affect her ADLs however she has adapted.  She does not feel that the tremor is worse from her last visit in 10/2017.  She has sought out  treatment options with various neurologist but no one has been able to come up with a good treatment regimen. She was diagnosed with functional neurological disorder with abnormal movement.  She is tolerating primidone and gabapentin as prescribed but does not feel that an increased dose would be well-tolerated.  She wishes to continue current treatment plan for now. We will continue to monitor and advise annual follow up.    No orders of the defined types were placed in this encounter.   No orders of the defined types were placed in this encounter.    Follow Up Instructions:  I discussed the assessment and treatment plan with the patient. The patient was provided an opportunity to ask questions and all were answered. The patient agreed with the plan and demonstrated an understanding of the instructions.   The patient was advised to call back or seek an in-person evaluation if the symptoms worsen or if the condition fails to improve as anticipated.  I provided 25 minutes of non-face-to-face time during this encounter. Patient located at her place of residence. Provider at her place of residence.    Shawnie Dapper, NP

## 2019-03-01 NOTE — Telephone Encounter (Signed)
Called pt. Her email is [email or web address]. Meeting w/ Amy today @ 3:30 created and emailed to pt. Phone call transferred to [facility] to update pt's meds, history, etc.

## 2019-03-05 ENCOUNTER — Encounter: Payer: Self-pay | Admitting: Family Medicine

## 2019-03-05 DIAGNOSIS — F444 Conversion disorder with motor symptom or deficit: Secondary | ICD-10-CM | POA: Insufficient documentation

## 2019-03-05 NOTE — Progress Notes (Signed)
Made any corrections needed, and agree with history, physical, neuro exam,assessment and plan as stated.     Massey Ruhland, MD [facility]  

## 2019-04-10 ENCOUNTER — Telehealth: Payer: Self-pay | Admitting: Family Medicine

## 2019-04-10 NOTE — Telephone Encounter (Signed)
Left message to sched 1 year f/u with Amy (around 02/29/2020).

## 2019-04-15 ENCOUNTER — Other Ambulatory Visit: Payer: Self-pay | Admitting: Neurology

## 2019-04-19 ENCOUNTER — Telehealth: Payer: Self-pay | Admitting: *Deleted

## 2019-04-19 NOTE — Telephone Encounter (Signed)
We received a fax dated 5/18 that stated primidone 50 mg responded to by other means. Stated their records show the prescription was not received. I called [facility] and spoke with Jesusita Oka who stated they did indeed receive the prescription dated 04/16/19 Primidone 50 mg  Sig: TAKE 1 TABLET BY MOUTH IN THE MORNING , 1 TABLET AT NOON AND ONE AND ONE-HALF (1 & 1/2) TABLETS IN THE EVENING       #315 refills 3. No further action needed.

## 2019-05-08 ENCOUNTER — Other Ambulatory Visit: Payer: Self-pay | Admitting: Family Medicine

## 2019-05-08 DIAGNOSIS — G250 Essential tremor: Secondary | ICD-10-CM

## 2019-05-08 MED ORDER — GABAPENTIN 300 MG PO CAPS: 300.0000 mg | ORAL_CAPSULE | Freq: Three times a day (TID) | ORAL | 3 refills | Status: DC

## 2019-05-08 NOTE — Telephone Encounter (Signed)
Pt husband has requested a refill of gabapentin (NEURONTIN) 300 MG capsule Kristopher Oppenheim ([address] [phone]

## 2019-05-08 NOTE — Addendum Note (Signed)
Addended by: Thomes Cake on: 05/08/2019 08:39 AM   Modules accepted: Orders

## 2019-05-24 ENCOUNTER — Telehealth: Payer: Self-pay | Admitting: *Deleted

## 2019-05-24 NOTE — Telephone Encounter (Signed)
Spoke with pt about her 6/29 appt. She stated Amy had told her that she would prefer to see the hand jerking in person that patient had reported. Pt gave me husband Peter's updated number [phone]. I called Collier Salina (on [facility]) and discussed appt. Pt r/s to Amy NP for in-office visit this coming Monday 6/29 @ 09:30 arrival 15 minutes early. He and patient will bring masks. He also confirmed he and patient had not been exposed in the last 2 weeks to anyone positive for COVID19 or suspected of having it, they have not had any fever, cough, SOB, respiratory symptoms or chills, and also confirmed no traveling out of state or country within last month. He verbalized appreciation for the call.

## 2019-05-28 ENCOUNTER — Ambulatory Visit: Payer: 59 | Admitting: Family Medicine

## 2019-05-28 ENCOUNTER — Encounter: Payer: Self-pay | Admitting: Family Medicine

## 2019-05-28 ENCOUNTER — Ambulatory Visit: Payer: 59 | Admitting: Neurology

## 2019-05-28 ENCOUNTER — Other Ambulatory Visit: Payer: Self-pay

## 2019-05-28 VITALS — BP 111/59 | HR 63 | Temp 97.7°F | Ht 64.0 in

## 2019-05-28 DIAGNOSIS — E1042 Type 1 diabetes mellitus with diabetic polyneuropathy: Secondary | ICD-10-CM

## 2019-05-28 DIAGNOSIS — I951 Orthostatic hypotension: Secondary | ICD-10-CM

## 2019-05-28 DIAGNOSIS — F444 Conversion disorder with motor symptom or deficit: Secondary | ICD-10-CM | POA: Diagnosis not present

## 2019-05-28 NOTE — Progress Notes (Signed)
PATIENT: [name] DOB: 1961/03/09  REASON FOR VISIT: follow up HISTORY FROM: patient  Chief Complaint  Patient presents with   Follow-up    Hand jerking. Husband present. New room. Patient mentioned her "body ticks" are getting progessively worse. She also mentioned that for the past 2 weeks she has been having some dizziness with position change.      HISTORY OF PRESENT ILLNESS: Today 05/28/19 [name] is a 58 y.o. female here today for follow up of tremor/jerking movements. She has complicated PMH including DMT1 with retinopathy, neuropathy, CKD, gastroparesis, hx of DKA on insulin pump, bilateral foot drop, orthostatic hypotension, thyroid disease, HLD, asthma, depression, MGUS, sleep apnea (not on CPAP) and essential tremor. She was seen virtually on 03/01/2019 and advised to continue primidone, lamictal and gabapentin as prescribed. She reports that her hand tremor and muscle jerking has not improved. She continues to have random jerking movements of her whole body, worse in hands. She reports today that she has noticed jerking movements of her stomach. She is using a wheelchair for mobilization outside of the home and a walker or scooter inside home. She reports multiple falls; no known injuries. She has peripheral neuropathy with foot drop bilaterally requiring braces. She was advised to participate in PT last year by Dr Arlana Pouch but did not complete. She does not feel this will be beneficial. She has been evaluated by a movement specialist (Dr Arlana Pouch) at [facility] and a neuropsychiatrist (Dr Gibson Ramp) and diagnosed with functional movement disorder. I am unable to locate note from Dr Gibson Ramp. She can not tolerate increased doses of primidone or gabapentin due to sleepiness and dizziness.   She also complains of dizziness with position changes This has been ongoing for years and has been diagnosed with orthostatic hypotension but has been worse over the past month. She has scheduled a visit  with Dr Zachery Dauer (PCP) to discuss. She does monitor blood pressure occasionally.   HISTORY: (copied from my note on 05/01/2019)  [name] is a 58 y.o. female for follow up.  She reports that her tremor continues to have adverse effects on her daily activities.  She reports that her husband will hold her hand as she takes her medications placed in pudding.  She is unable to type on her computer.  She continues primidone 50 mg in the morning, 50 mg at noon and 75 mg in the evenings.  She is taking gabapentin 300 mg 3 times daily.  At her last visit in December 2018 she reported that her tremor was worse.  She was unable to write.  She has a very complicated history and is on multiple medications.  We had tried to increase primidone but she was unable to tolerate this.  We have also tried to increase Neurontin however side effects limited therapy.  Her blood pressures are always fairly low.  Propranolol not an option at this time.  She is taking Lamictal as well.  Dr. Daisy Blossom had mentioned to her that maybe we should consider a referral to [facility] for ultrasound or deep brain stimulation to augment current therapy.  She was evaluated and told that "the type of tremor she had was not treatable."  She was then seen by a neuro physiologist who was uncertain on how to treat her tremor.  She has not been seen since. She was advised to see neuropsychology for formal assessment of functional neurological disorder with abnormal movement.   History (copied from dr  Ahern's note on 11/02/2017)  Tremor is worse. She cant use her mouse, she can;t write, it is illegible.She has a complicated history, is on multiple drugs, could not tolerate increase in primidone or neurontin, BP too low for propranolol. Hesistate to try any other medication, unable to type on a computer.  Also on lamictal. At this point have nothing else to offer her, [facility] or DBS or continuing as is with  current treatment.  Interval update 10/28/2016: Her hands are still shaky. She can't eat without getting food on her shirt. She can;t take primidone, sleeping too much. She takes it three timed a day, does not want to take it all at night. Takes gabapentin three times a day.They help with the tremor and the pain. Discussed propranolol and clonazepam.Patient has low BP, would have to watch her BP and pulse very carefully, stop for any side effects especially lightheadedness, dizziness, chest pain, stay well hydrated take daly BP and pulse.  Interval update;Tremor is improved by the primidone and the gabapentin. But the tremor is still impairing her ability to write and her ability to ability to text. She is on neurontin and the primidone. Recent increase in primidone didn't help. And the primidone is making her tired. Her blood pressure is always low and this is likely why she hasn't tried propranolol. She is having memory problems. She is having word-finding difficulties.   HPI: [name] HiresMary [name] is a 58 y.o. female here as a referral from Dr. Zachery DauerBarnes for tremor. She has very complicated PMHx. DM 1 with complications of retinopathy and neuropathy and chronic renal insufficiency and gastroparesis and frozen shoulder, DKA, orthostatic hypotension, with insulin pump, neuropathy, bilateral foot drop, thyroid disease, HLD, asthma, depression, MGUS, essential tremor. Sleep apnea does not use cpap  She has had tremors. For 5 years. It is worsening. In the hands. Sometimes they are so bad, husband has to hold her hands and she can't stop them even her whole arm shakes. Progressively, slowly getting worse. No caffeine. No coffee. Primidone helped in the past. Doesn't notice it in the mouth or legs. Has worsening neuropathy and was being followed by a neurologist who did a full evaluation. Does not drink alcohol. tremor interferes with eating and cutting food. Had MRi of the brain a few years ago which was  negative. She has worsening memory problems. No FHx neuropathy or neurmuscular disease.   REVIEW OF SYSTEMS: Out of a complete 14 system review of symptoms, the patient complains only of the following symptoms, and all other reviewed systems are negative.  ALLERGIES: Allergies  Allergen Reactions   Codeine    Food     MSG and Lactose (cannot have any dairy products)    Penicillins     She states she can have penicillin derivatives    HOME MEDICATIONS: Outpatient Medications Prior to Visit  Medication Sig Dispense Refill   albuterol (PROVENTIL HFA;VENTOLIN HFA) 108 (90 BASE) MCG/ACT inhaler Inhale 1 puff into the lungs as needed for wheezing or shortness of breath.     aspirin 81 MG chewable tablet Chew 81 mg by mouth daily.     Carboxymethylcellul-Glycerin (REFRESH OPTIVE OP) Apply 1 drop to eye as needed.     Choline Fenofibrate (FENOFIBRIC ACID) 45 MG CPDR Take 45 mg by mouth 3 (three) times daily.     CREON 36000 UNITS CPEP capsule Take 36,000 Units by mouth daily. Take two capsules with snacks and three capsules with meals  cycloSPORINE (RESTASIS) 0.05 % ophthalmic emulsion Place 1 drop into both eyes 2 (two) times daily.      escitalopram (LEXAPRO) 10 MG tablet Take 10 mg by mouth 2 (two) times daily.      estradiol (ESTRACE) 2 MG tablet Take 2 mg by mouth daily.     famotidine (PEPCID) 10 MG tablet Take 10 mg by mouth 2 (two) times daily.     gabapentin (NEURONTIN) 300 MG capsule Take 1 capsule (300 mg total) by mouth 3 (three) times daily. 270 capsule 3   Insulin Human (INSULIN PUMP) SOLN Inject 1 each into the skin. Insulin pump MEDTRONIC with humalog (Will be changing to TANDEM).Works with DEXCOM 4 CGM     insulin lispro (HUMALOG) 100 UNIT/ML injection Has with insulin pump     lamoTRIgine (LAMICTAL) 150 MG tablet TAKE 1 TABLET BY MOUTH TWICE DAILY 180 tablet 3   lansoprazole (PREVACID) 30 MG capsule Take 30 mg by mouth 2 (two) times daily before a meal.      levocetirizine (XYZAL) 5 MG tablet Take 5 mg by mouth 2 (two) times daily. Takes OTC version per patient. Xyzal is not covered  By her insurance.     levothyroxine (SYNTHROID, LEVOTHROID) 112 MCG tablet Take 112 mcg by mouth daily.     mometasone (NASONEX) 50 MCG/ACT nasal spray Place 2 sprays into the nose daily. OTC     montelukast (SINGULAIR) 10 MG tablet Take 10 mg by mouth at bedtime.     Multiple Vitamins-Minerals (MULTIVITAMIN GUMMIES ADULT PO) Take 2 Doses by mouth daily.     primidone (MYSOLINE) 50 MG tablet TAKE 1 TABLET BY MOUTH IN THE MORNING , 1 TABLET AT NOON AND ONE AND ONE-HALF (1 & 1/2) TABLETS IN THE EVENING 315 tablet 3   rosuvastatin (CRESTOR) 20 MG tablet Take 20 mg by mouth daily.     Simethicone (GAS-X EXTRA STRENGTH PO) Take 1 tablet by mouth as needed.     No facility-administered medications prior to visit.     PAST MEDICAL HISTORY: Past Medical History:  Diagnosis Date   Adhesive capsulitis 2013   Bilateral shoulders   Adhesive capsulitis of both shoulders 2013   Anemia    Cataract    Chronic kidney disease    Depression    Diabetes mellitus without complication (HCC)    Diabetic retinopathy (HCC) 2010   Diabetic retinopathy (HCC) 2010   Left eye, mild   Essential tremor 2011   Bilateral hands   Foot drop, bilateral 2007   Foot fracture, left    Gastroparesis 2009   GERD (gastroesophageal reflux disease)    Headache    Hemorrhage 2011   Laser surgery x2   High potassium 2012   Normal as of 2014   Hypercholesterolemia 2010   Hyperlipidemia    Neuropathy    Pneumonia    Postnasal drip 2010   Scratched cornea 2013   Left eye   Sleep apnea    does not wear CPAP   Stage 3 chronic kidney disease (HCC)    Thyroid disease    TMJ (temporomandibular joint disorder)    Type 1 diabetes (HCC)     PAST SURGICAL HISTORY: Past Surgical History:  Procedure Laterality Date   ABDOMINAL HYSTERECTOMY  2007    APPENDECTOMY     BALLOON DILATION N/A 01/24/2017   Procedure: BALLOON DILATION;  Surgeon: Charlott RakesVincent Schooler, MD;  Location: [facility];  Service: Endoscopy;  Laterality: N/A;   CARPAL TUNNEL RELEASE Right  1990   CATARACT EXTRACTION Bilateral 2008   COLONOSCOPY  2007   COLONOSCOPY WITH PROPOFOL N/A 01/24/2017   Procedure: COLONOSCOPY WITH PROPOFOL;  Surgeon: Charlott RakesVincent Schooler, MD;  Location: [facility];  Service: Endoscopy;  Laterality: N/A;   ESOPHAGOGASTRODUODENOSCOPY (EGD) WITH PROPOFOL N/A 01/24/2017   Procedure: ESOPHAGOGASTRODUODENOSCOPY (EGD) WITH PROPOFOL;  Surgeon: Charlott RakesVincent Schooler, MD;  Location: [facility];  Service: Endoscopy;  Laterality: N/A;  moved oer MD request   EYE SURGERY Left 2012   Pars plana viterctomy, membrane peel, endolaser   ROOT CANAL  2007   ROOT CANAL  2010   Thorat dilation  2007   Throat dilation  2007   Throat dilation  2010, 0ct 2011   Sep 2010, Nov 2010, 2014   TONSILLECTOMY  1971   Trigger fingers  2008   WISDOM TOOTH EXTRACTION  1977    FAMILY HISTORY: Family History  Problem Relation Age of Onset   Hypertension Mother    Diabetes Father    COPD Father    Pneumonia Father    Stroke Maternal Grandmother    Hypertension Maternal Grandmother    Diabetes Maternal Grandfather    Parkinson's disease Maternal Grandfather    Heart disease Maternal Grandfather    Parkinsonism Neg Hx    Neuropathy Neg Hx    Breast cancer Neg Hx     SOCIAL HISTORY: Social History   Socioeconomic History   Marital status: Married    Spouse name: Theron Aristaeter   Number of children: 2   Years of education: Ba   Highest education level: Not on file  Occupational History   Not on file  Social Needs   Financial resource strain: Not on file   Food insecurity    Worry: Not on file    Inability: Not on file   Transportation needs    Medical: Not on file    Non-medical: Not on file  Tobacco Use   Smoking status: Never Smoker    Smokeless tobacco: Never Used  Substance and Sexual Activity   Alcohol use: No    Alcohol/week: 0.0 standard drinks   Drug use: No   Sexual activity: Not on file  Lifestyle   Physical activity    Days per week: Not on file    Minutes per session: Not on file   Stress: Not on file  Relationships   Social connections    Talks on phone: Not on file    Gets together: Not on file    Attends religious service: Not on file    Active member of club or organization: Not on file    Attends meetings of clubs or organizations: Not on file    Relationship status: Not on file   Intimate partner violence    Fear of current or ex partner: Not on file    Emotionally abused: Not on file    Physically abused: Not on file    Forced sexual activity: Not on file  Other Topics Concern   Not on file  Social History Narrative   Lives at home with husband and daughter.   Caffeine use: 1 soda per day   Right handed      PHYSICAL EXAM  Vitals:   05/28/19 0926  BP: (!) 111/59  Pulse: 63  Temp: 97.7 F (36.5 C)  TempSrc: Oral  Height: 5\' 4"  (1.626 m)   Body mass index is 28.01 kg/m.  Generalized: Well developed, in no acute distress  Cardiology: normal rate and rhythm, no  murmur noted Respiratory: clear to auscultation bilaterally ENT: TM intact bilaterally, mildly erythematous pharynx, no lymphadenopathy, Mallampati 3+ Neurological examination  Mentation: Alert oriented to time, place, history taking. Follows all commands speech and language fluent Cranial nerve II-XII: Pupils were equal round reactive to light. Extraocular movements were full, visual field were full on confrontational test. Facial sensation and strength were normal. Uvula tongue midline.  Motor: The motor testing reveals 4 over 5 strength of upper extremities, 3/5 of lowers (limited by pain). Good symmetric motor tone is noted throughout.  Tremor is intermittent, worse when performing physical assessment, not  noticed while interviewing patient. Exam is limited as she is unable to relax upper extremities.  Coordination: Cerebellar testing reveals good finger-nose-finger, no tremor noted, unable to perform heel to shin (due to pain)  Gait and station: unable to assess due to patient report of inability to walk    DIAGNOSTIC DATA (LABS, IMAGING, TESTING) - I reviewed patient records, labs, notes, testing and imaging myself where available.  No flowsheet data found.   Lab Results  Component Value Date   WBC 10.2 (H) 12/01/2017   HGB 12.1 12/01/2017   HCT 37.2 12/01/2017   MCV 99 12/01/2017   PLT 239 12/01/2017   No results found for: NA, K, CL, CO2, GLUCOSE, BUN, CREATININE, CALCIUM, PROT, ALBUMIN, AST, ALT, ALKPHOS, BILITOT, GFRNONAA, GFRAA No results found for: CHOL, HDL, LDLCALC, LDLDIRECT, TRIG, CHOLHDL No results found for: HGBA1C No results found for: VITAMINB12 No results found for: TSH   ASSESSMENT AND PLAN 58 y.o. year old female  has a past medical history of Adhesive capsulitis (2013), Adhesive capsulitis of both shoulders (2013), Anemia, Cataract, Chronic kidney disease, Depression, Diabetes mellitus without complication ([place]), Diabetic retinopathy ([place]) (2010), Diabetic retinopathy ([place]) (2010), Essential tremor (2011), Foot drop, bilateral (2007), Foot fracture, left, Gastroparesis (2009), GERD (gastroesophageal reflux disease), Headache, Hemorrhage (2011), High potassium (2012), Hypercholesterolemia (2010), Hyperlipidemia, Neuropathy, Pneumonia, Postnasal drip (2010), Scratched cornea (2013), Sleep apnea, Stage 3 chronic kidney disease ([place]), Thyroid disease, TMJ (temporomandibular joint disorder), and Type 1 diabetes ([place]). here with     ICD-10-CM   1. 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 Diabetic polyneuropathy associated with type 1 diabetes mellitus (HCC)  E10.42   3. Orthostatic hypotension  I95.1     [name] continues to have concerns  of tremor and abnormal jerking movements of muscles. She is tolerating primidone, gabapentin and Lamictal at current doses but has failed attempts to increase dose due to sleepiness and dizziness. She has orthostatic hypotension and BP is fairly low. Today's reading is stable and similar to previous readings. She has scheduled follow up with her PCP for concerns of dizziness and wellness exam. I have advised that we continue current medication regimen. I will request notes from Dr Lyman Speller for review. At this time, I do not feel that we have any further treatment options available. Multiple evaluations and workup have been negative. She will follow up pending notes from Dr Lyman Speller. She was advised to call for worsening symptoms. She and her husband verbalize understanding and agreement with this plan.    No orders of the defined types were placed in this encounter.    No orders of the defined types were placed in this encounter.     I spent 15 minutes with the patient. 50% of this time was spent counseling and educating patient on plan of care and medications.    Debbora Presto, [name]-C 05/28/2019, 11:50  AM [facility] [address], [address] [place], [place] [address] [phone]

## 2019-05-28 NOTE — Progress Notes (Signed)
Made any corrections needed, and agree with history, physical, neuro exam,assessment and plan as stated.     Antonia Ahern, MD [facility]  

## 2019-05-28 NOTE — Patient Instructions (Addendum)
Continue primidone and gabapentin as prescribed.   We will get notes from neuropsychologist  Will schedule follow up once notes are reviewed    Tremor A tremor is trembling or shaking that you cannot control. Most tremors affect the hands or arms. Tremors can also affect the head, vocal cords, face, and other parts of the body. There are many types of tremors. Common types include:  Essential tremor. These usually occur in people older than 40. It may run in families and can happen in otherwise healthy people.  Resting tremor. These occur when the muscles are at rest, such as when your hands are resting in your lap. People with Parkinson's disease often have resting tremors.  Postural tremor. These occur when you try to hold a pose, such as keeping your hands outstretched.  Kinetic tremor. These occur during purposeful movement, such as trying to touch a finger to your nose.  Task-specific tremor. These may occur when you perform certain tasks such as writing, speaking, or standing.  Psychogenic tremor. These dramatically lessen or disappear when you are distracted. They can happen in people of all ages. Some types of tremors have no known cause. Tremors can also be a symptom of nervous system problems (neurological disorders) that may occur with aging. Some tremors go away with treatment, while others do not. Follow these instructions at home: Lifestyle      Limit alcohol intake to no more than 1 drink a day for nonpregnant women and 2 drinks a day for men. One drink equals 12 oz of beer, 5 oz of wine, or 1 oz of hard liquor.  Do not use any products that contain nicotine or tobacco, such as cigarettes and e-cigarettes. If you need help quitting, ask your health care provider.  Avoid extreme heat and extreme cold.  Limit your caffeine intake, as told by your health care provider.  Try to get 8 hours of sleep each night.  Find ways to manage your stress, such as meditation or  yoga. General instructions  Take over-the-counter and prescription medicines only as told by your health care provider.  Keep all follow-up visits as told by your health care provider. This is important. Contact a health care provider if you:  Develop a tremor after starting a new medicine.  Have a tremor along with other symptoms such as: ? Numbness. ? Tingling. ? Pain. ? Weakness.  Notice that your tremor gets worse.  Notice that your tremor interferes with your day-to-day life. Summary  A tremor is trembling or shaking that you cannot control.  Most tremors affect the hands or arms.  Some types of tremors have no known cause. Others may be a symptom of nervous system problems (neurological disorders).  Make sure you discuss any tremors you have with your health care provider. This information is not intended to replace advice given to you by your health care provider. Make sure you discuss any questions you have with your health care provider. Document Released: 11/05/2002 Document Revised: 10/28/2017 Document Reviewed: 09/15/2017 Elsevier Patient Education  2020 [employer].

## 2019-09-03 ENCOUNTER — Other Ambulatory Visit: Payer: Self-pay | Admitting: Neurology

## 2019-09-03 DIAGNOSIS — G250 Essential tremor: Secondary | ICD-10-CM

## 2019-09-06 ENCOUNTER — Other Ambulatory Visit: Payer: Self-pay | Admitting: Neurology

## 2019-09-06 DIAGNOSIS — G250 Essential tremor: Secondary | ICD-10-CM

## 2019-09-10 ENCOUNTER — Telehealth: Payer: Self-pay | Admitting: Family Medicine

## 2019-09-10 NOTE — Telephone Encounter (Signed)
Phone rep checked office voicemail; pt stated that she would the gabapentin (NEURONTIN) 300 MG capsule sent to [place]  For 3 refills(pt states she will call them to confirm status of request) Pt is also asking for a 30 day amount sent to [place] while waiting on mail order from [facility]

## 2019-09-10 NOTE — Telephone Encounter (Signed)
Pt called in and stated a 90day supply needs to be sent to [facility] with 3 refills, and a temp 30 day supply sent to [facility]

## 2019-09-11 ENCOUNTER — Other Ambulatory Visit: Payer: Self-pay

## 2019-09-11 DIAGNOSIS — G250 Essential tremor: Secondary | ICD-10-CM

## 2019-09-11 MED ORDER — GABAPENTIN 300 MG PO CAPS: 300.0000 mg | ORAL_CAPSULE | Freq: Three times a day (TID) | ORAL | 1 refills | Status: DC

## 2019-09-11 NOTE — Telephone Encounter (Signed)
I called pt and her husband answer the phone. I explain pt requesting a temp refill to [facility] and [facility] order.I stated if pt wants the gabapentin sent to both insurance may only pay for one prescription. I stated pt may have to pay out of the pocket on one of them. He stated to send refill to the [facility] mailed order because pt has enough refills until it gets deliver. I stated pt needs a follow up to continue refills ongoing. I schedule appt with Amy NP on 09/27/2019. Pt was last seen in June 2020. Refill for gabapentin sent to [facility] mailed order.The husband verbalized understanding.

## 2019-09-12 ENCOUNTER — Encounter: Payer: Self-pay | Admitting: Family Medicine

## 2019-09-13 ENCOUNTER — Other Ambulatory Visit: Payer: Self-pay | Admitting: *Deleted

## 2019-09-13 DIAGNOSIS — G250 Essential tremor: Secondary | ICD-10-CM

## 2019-09-13 MED ORDER — GABAPENTIN 300 MG PO CAPS: 300.0000 mg | ORAL_CAPSULE | Freq: Three times a day (TID) | ORAL | 0 refills | Status: DC

## 2019-09-27 ENCOUNTER — Ambulatory Visit: Payer: Self-pay | Admitting: Family Medicine

## 2020-02-25 ENCOUNTER — Telehealth: Payer: Self-pay | Admitting: Family Medicine

## 2020-02-25 DIAGNOSIS — G250 Essential tremor: Secondary | ICD-10-CM

## 2020-02-25 NOTE — Telephone Encounter (Signed)
1) Medication(s) Requested (by name): gabapentin (NEURONTIN) 300 MG capsule   2) Pharmacy of Choice: [facility] [place] [address] [phone]

## 2020-02-26 MED ORDER — GABAPENTIN 300 MG PO CAPS
300.0000 mg | ORAL_CAPSULE | Freq: Three times a day (TID) | ORAL | 0 refills | Status: AC
Start: 2020-02-26 — End: ?

## 2020-02-26 NOTE — Telephone Encounter (Signed)
I called pt and she has appt scheduled 04-24-20 with AL/NP.  I will give gabapentin only till 04-24-20 until seen.  She stated Dr. Zachery Dauer psp is not filling this for her.

## 2020-02-26 NOTE — Telephone Encounter (Signed)
She was advised follow up needed at last refill in 08/2019. 90 tablets given at that time with no refills. Appt was scheduled but not kept. She needs follow up. I recommend discussing continued refills with PCP if they are willing to prevent having to see another specialist.

## 2020-03-28 ENCOUNTER — Other Ambulatory Visit: Payer: Self-pay | Admitting: Family Medicine

## 2020-03-28 NOTE — Telephone Encounter (Signed)
Pt is asking for a refill on her lamoTRIgine (LAMICTAL) 150 MG tablet to [facility] pt is asking for a 90 day refill with 3 refills

## 2020-03-31 ENCOUNTER — Telehealth: Payer: Self-pay | Admitting: Family Medicine

## 2020-03-31 NOTE — Addendum Note (Signed)
Addended by: Guy Begin on: 03/31/2020 09:49 AM   Modules accepted: Orders

## 2020-03-31 NOTE — Telephone Encounter (Signed)
Pt called stating she has no more medication left and her pharmacy informed her that it has not been called in for her as of yet and she is needing it called in soon due to them closing at 6pm. Pt is needing her lamoTRIgine (LAMICTAL) 150 MG tablet called in for a 90 day supply with refills to the [employer]. Please advise.

## 2020-03-31 NOTE — Telephone Encounter (Signed)
I called Dr. Zachery Dauer [facility]) and from there records they did have hwere she did get 90 day supply thru [facility], then another temprorary supply 11-28-19 thru her local pharmacy  She she would be [facility], I relayed that pt needing today at pharmacy  in [place].  I relayed to pt.  (has we have not filled for her since 2018.  She was appreciative.

## 2020-03-31 NOTE — Telephone Encounter (Signed)
I called pt and asked her about the lamotrigine, as listed last time was per Dr. Zachery Dauer.  She is seen for tremor, neuropathy.  Needs refill 90 day and 3 refills.  Has appt 04-24-20 will need rescheduled as provider out that day. To be called back and rescheduled.

## 2020-04-09 ENCOUNTER — Other Ambulatory Visit (HOSPITAL_COMMUNITY): Payer: Self-pay | Admitting: Family Medicine

## 2020-04-09 ENCOUNTER — Other Ambulatory Visit: Payer: Self-pay | Admitting: Family Medicine

## 2020-04-09 DIAGNOSIS — R0989 Other specified symptoms and signs involving the circulatory and respiratory systems: Secondary | ICD-10-CM

## 2020-04-09 DIAGNOSIS — Z1231 Encounter for screening mammogram for malignant neoplasm of breast: Secondary | ICD-10-CM

## 2020-04-15 ENCOUNTER — Ambulatory Visit
Admission: RE | Admit: 2020-04-15 | Discharge: 2020-04-15 | Disposition: A | Discharge status: Home / Self Care | Payer: Managed Care, Other (non HMO) | Source: Ambulatory Visit | Attending: Family Medicine | Admitting: Family Medicine

## 2020-04-15 ENCOUNTER — Other Ambulatory Visit: Payer: Self-pay

## 2020-04-15 ENCOUNTER — Encounter (HOSPITAL_COMMUNITY): Payer: Managed Care, Other (non HMO)

## 2020-04-15 ENCOUNTER — Ambulatory Visit (HOSPITAL_COMMUNITY)
Admission: RE | Admit: 2020-04-15 | Discharge: 2020-04-15 | Disposition: A | Discharge status: Home / Self Care | Payer: Managed Care, Other (non HMO) | Source: Ambulatory Visit | Attending: Family Medicine | Admitting: Family Medicine

## 2020-04-15 DIAGNOSIS — R0989 Other specified symptoms and signs involving the circulatory and respiratory systems: Secondary | ICD-10-CM

## 2020-04-15 DIAGNOSIS — Z1231 Encounter for screening mammogram for malignant neoplasm of breast: Secondary | ICD-10-CM

## 2020-04-15 NOTE — Progress Notes (Signed)
Carotid artery duplex has been completed. Preliminary results can be found in CV Proc through chart review.   04/15/20 4:09 PM Olen Cordial RVT

## 2020-04-17 ENCOUNTER — Other Ambulatory Visit: Payer: Self-pay | Admitting: Family Medicine

## 2020-04-17 DIAGNOSIS — R928 Other abnormal and inconclusive findings on diagnostic imaging of breast: Secondary | ICD-10-CM

## 2020-04-24 ENCOUNTER — Ambulatory Visit: Payer: Self-pay | Admitting: Family Medicine

## 2020-04-25 ENCOUNTER — Ambulatory Visit
Admission: RE | Admit: 2020-04-25 | Discharge: 2020-04-25 | Disposition: A | Discharge status: Home / Self Care | Payer: Managed Care, Other (non HMO) | Source: Ambulatory Visit | Attending: Family Medicine | Admitting: Family Medicine

## 2020-04-25 ENCOUNTER — Other Ambulatory Visit: Payer: Self-pay

## 2020-04-25 DIAGNOSIS — R928 Other abnormal and inconclusive findings on diagnostic imaging of breast: Secondary | ICD-10-CM

## 2020-05-16 ENCOUNTER — Ambulatory Visit: Payer: 59 | Admitting: Podiatry

## 2020-05-16 ENCOUNTER — Other Ambulatory Visit: Payer: Self-pay

## 2020-05-16 ENCOUNTER — Encounter: Payer: Self-pay | Admitting: Podiatry

## 2020-05-16 DIAGNOSIS — E108 Type 1 diabetes mellitus with unspecified complications: Secondary | ICD-10-CM | POA: Diagnosis not present

## 2020-05-16 DIAGNOSIS — M21371 Foot drop, right foot: Secondary | ICD-10-CM

## 2020-05-16 DIAGNOSIS — S90821A Blister (nonthermal), right foot, initial encounter: Secondary | ICD-10-CM

## 2020-05-16 DIAGNOSIS — M21372 Foot drop, left foot: Secondary | ICD-10-CM

## 2020-05-20 NOTE — Progress Notes (Signed)
Subjective:  Patient ID: [name], female    DOB: 04/07/1961,  MRN: [id number]  Chief Complaint  Patient presents with  . Foot Ulcer    the right foot has a spot on the right heel     59 y.o. female presents with the above complaint.  Patient presents with a right posterior blister ration/ulceration.  Patient states that it started off with little bit of pressure and has progressive gotten worse.  Patient known to Dr. Grandville Silos who has not been able to see any of these patients.  She states that she has not done anything besides some local wound care.  She is a type I diabetic with last A1c of 7.8.  She does have neuropathy.  There has been some drainage associated with it.  There is some odor.  Patient has been using Santyl but has not been helping.  Patient has been keeping it wet.  She denies any other acute complaints.   Review of Systems: Negative except as noted in the HPI. Denies N/V/F/Ch.  Past Medical History:  Diagnosis Date  . Adhesive capsulitis 2013   Bilateral shoulders  . Adhesive capsulitis of both shoulders 2013  . Anemia   . Cataract   . Chronic kidney disease   . Depression   . Diabetes mellitus without complication (HCC)   . Diabetic retinopathy (HCC) 2010  . Diabetic retinopathy (HCC) 2010   Left eye, mild  . Essential tremor 2011   Bilateral hands  . Foot drop, bilateral 2007  . Foot fracture, left   . Gastroparesis 2009  . GERD (gastroesophageal reflux disease)   . Headache   . Hemorrhage 2011   Laser surgery x2  . High potassium 2012   Normal as of 2014  . Hypercholesterolemia 2010  . Hyperlipidemia   . Neuropathy   . Pneumonia   . Postnasal drip 2010  . Scratched cornea 2013   Left eye  . Sleep apnea    does not wear CPAP  . Stage 3 chronic kidney disease   . Thyroid disease   . TMJ (temporomandibular joint disorder)   . Type 1 diabetes (HCC)     Current Outpatient Medications:  .  Pancrelipase, Lip-Prot-Amyl, (PANCREAZE PO), Take by  mouth., Disp: , Rfl:  .  albuterol (PROVENTIL HFA;VENTOLIN HFA) 108 (90 BASE) MCG/ACT inhaler, Inhale 1 puff into the lungs as needed for wheezing or shortness of breath., Disp: , Rfl:  .  aspirin 81 MG chewable tablet, Chew 81 mg by mouth daily., Disp: , Rfl:  .  Carboxymethylcellul-Glycerin (REFRESH OPTIVE OP), Apply 1 drop to eye as needed., Disp: , Rfl:  .  Choline Fenofibrate (FENOFIBRIC ACID) 45 MG CPDR, Take 45 mg by mouth 3 (three) times daily., Disp: , Rfl:  .  cycloSPORINE (RESTASIS) 0.05 % ophthalmic emulsion, Place 1 drop into both eyes 2 (two) times daily. , Disp: , Rfl:  .  escitalopram (LEXAPRO) 10 MG tablet, Take 10 mg by mouth 2 (two) times daily. , Disp: , Rfl:  .  estradiol (ESTRACE) 2 MG tablet, Take 2 mg by mouth daily., Disp: , Rfl:  .  famotidine (PEPCID) 10 MG tablet, Take 10 mg by mouth 2 (two) times daily., Disp: , Rfl:  .  gabapentin (NEURONTIN) 300 MG capsule, Take 1 capsule (300 mg total) by mouth 3 (three) times daily., Disp: 180 capsule, Rfl: 0 .  Insulin Human (INSULIN PUMP) SOLN, Inject 1 each into the skin. Insulin pump MEDTRONIC with humalog (Will  be changing to TANDEM).Works with DEXCOM 4 CGM, Disp: , Rfl:  .  insulin lispro (HUMALOG) 100 UNIT/ML injection, Has with insulin pump, Disp: , Rfl:  .  lamoTRIgine (LAMICTAL) 150 MG tablet, TAKE 1 TABLET BY MOUTH TWICE DAILY, Disp: 180 tablet, Rfl: 3 .  lansoprazole (PREVACID) 30 MG capsule, Take 30 mg by mouth 2 (two) times daily before a meal., Disp: , Rfl:  .  levocetirizine (XYZAL) 5 MG tablet, Take 5 mg by mouth 2 (two) times daily. Takes OTC version per patient. Xyzal is not covered  By her insurance., Disp: , Rfl:  .  levothyroxine (SYNTHROID, LEVOTHROID) 112 MCG tablet, Take 112 mcg by mouth daily., Disp: , Rfl:  .  mometasone (NASONEX) 50 MCG/ACT nasal spray, Place 2 sprays into the nose daily. OTC, Disp: , Rfl:  .  montelukast (SINGULAIR) 10 MG tablet, Take 10 mg by mouth at bedtime., Disp: , Rfl:  .  Multiple  Vitamins-Minerals (MULTIVITAMIN GUMMIES ADULT PO), Take 2 Doses by mouth daily., Disp: , Rfl:  .  primidone (MYSOLINE) 50 MG tablet, TAKE 1 TABLET BY MOUTH IN THE MORNING , 1 TABLET AT NOON AND ONE AND ONE-HALF (1 & 1/2) TABLETS IN THE EVENING, Disp: 315 tablet, Rfl: 3 .  rosuvastatin (CRESTOR) 20 MG tablet, Take 20 mg by mouth daily., Disp: , Rfl:  .  Simethicone (GAS-X EXTRA STRENGTH PO), Take 1 tablet by mouth as needed., Disp: , Rfl:   Social History   Tobacco Use  Smoking Status Never Smoker  Smokeless Tobacco Never Used    Allergies  Allergen Reactions  . Codeine   . Food     MSG and Lactose (cannot have any dairy products)   . Penicillins     She states she can have penicillin derivatives   Objective:  There were no vitals filed for this visit. There is no height or weight on file to calculate BMI. Constitutional Well developed. Well nourished.  Vascular Dorsalis pedis pulses palpable bilaterally. Posterior tibial pulses palpable bilaterally. Capillary refill normal to all digits.  No cyanosis or clubbing noted. Pedal hair growth normal.  Neurologic Normal speech. Oriented to person, place, and time. Epicritic sensation to light touch grossly present bilaterally.  Dermatologic  right posterior heel blister.  Superficial underlying wound noted.  No clinical signs of infection noted.  The blister was deroofed with no fluid collection.  No this is likely a frictional blister from her foot orthosis.  She does have a bilateral lower extremity foot drop likely causing the pressure in the posterior heel. Skin within normal limits  Orthopedic: Normal joint ROM without pain or crepitus bilaterally. No visible deformities. No bony tenderness.   Radiographs: None Assessment:   1. Blister of right foot, initial encounter   2. Foot drop, bilateral   3. Diabetes mellitus type 1 with complications [facility])    Plan:  Patient was evaluated and treated and all questions  answered.  Right posterior heel frictional blister secondary to worn-out orthosis for bilateral foot drop -I explained to patient the etiology of frictional blisters and various treatment options were discussed I believe that adequate padding as well as Betadine wet-to-dry dressing changes will help decrease the wetness or maceration associated with the blister formation.  I will apply Betadine wet-to-dry dressing changes have asked the patient to do it 3 times a week.  I will also place the patient in a cam boot for immobilization to the posterior heel. -I am worried that this may result  in a wound in the near future if so we will plan on doing local wound care until resolve meant.   Bilateral foot drop -I explained patient the etiology of foot drop and various treatment options were discussed.  Patient is currently on [place] brace bilaterally however I believe patient will benefit from modification to the brace as they appear to be worn out.  If they are not able to be modified I believe patient will benefit from a new pair of brace.  Is causing her a frictional blister to the posterior side.   No follow-ups on file.

## 2020-05-28 ENCOUNTER — Other Ambulatory Visit: Payer: Self-pay

## 2020-05-28 ENCOUNTER — Ambulatory Visit: Payer: 59 | Admitting: Podiatry

## 2020-05-28 DIAGNOSIS — L97412 Non-pressure chronic ulcer of right heel and midfoot with fat layer exposed: Secondary | ICD-10-CM | POA: Diagnosis not present

## 2020-05-28 DIAGNOSIS — M21371 Foot drop, right foot: Secondary | ICD-10-CM

## 2020-05-28 DIAGNOSIS — M21372 Foot drop, left foot: Secondary | ICD-10-CM

## 2020-05-29 ENCOUNTER — Encounter: Payer: Self-pay | Admitting: Podiatry

## 2020-05-29 NOTE — Progress Notes (Signed)
Subjective:  Patient ID: [name], female    DOB: [birth year],  MRN: [id number]  Chief Complaint  Patient presents with  . Foot Pain    pt is here for a f/u of wound to the right foot, pt states that she is forming a rash over the top of the right foot. Pt has tried applying hydrocortisone cream to the area, but not much has helped    59 y.o. female presents with the above complaint.  Patient presents with a right posterior blister ration/ulceration.  Patient states that it started off with little bit of pressure and has progressive gotten worse.  Patient known to Dr. Grandville Silos who has not been able to see any of these patients.  She states that she has not done anything besides some local wound care.  She is a type I diabetic with last A1c of 7.8.  She does have neuropathy.  There has been some drainage associated with it.  There is some odor.  Patient has been using Santyl but has not been helping.  Patient has been keeping it wet.  She denies any other acute complaints.   Review of Systems: Negative except as noted in the HPI. Denies N/V/F/Ch.  Past Medical History:  Diagnosis Date  . Adhesive capsulitis 2013   Bilateral shoulders  . Adhesive capsulitis of both shoulders 2013  . Anemia   . Cataract   . Chronic kidney disease   . Depression   . Diabetes mellitus without complication (HCC)   . Diabetic retinopathy (HCC) 2010  . Diabetic retinopathy (HCC) 2010   Left eye, mild  . Essential tremor 2011   Bilateral hands  . Foot drop, bilateral 2007  . Foot fracture, left   . Gastroparesis 2009  . GERD (gastroesophageal reflux disease)   . Headache   . Hemorrhage 2011   Laser surgery x2  . High potassium 2012   Normal as of 2014  . Hypercholesterolemia 2010  . Hyperlipidemia   . Neuropathy   . Pneumonia   . Postnasal drip 2010  . Scratched cornea 2013   Left eye  . Sleep apnea    does not wear CPAP  . Stage 3 chronic kidney disease   . Thyroid disease   . TMJ  (temporomandibular joint disorder)   . Type 1 diabetes (HCC)     Current Outpatient Medications:  .  albuterol (PROVENTIL HFA;VENTOLIN HFA) 108 (90 BASE) MCG/ACT inhaler, Inhale 1 puff into the lungs as needed for wheezing or shortness of breath., Disp: , Rfl:  .  aspirin 81 MG chewable tablet, Chew 81 mg by mouth daily., Disp: , Rfl:  .  Carboxymethylcellul-Glycerin (REFRESH OPTIVE OP), Apply 1 drop to eye as needed., Disp: , Rfl:  .  Choline Fenofibrate (FENOFIBRIC ACID) 45 MG CPDR, Take 45 mg by mouth 3 (three) times daily., Disp: , Rfl:  .  cycloSPORINE (RESTASIS) 0.05 % ophthalmic emulsion, Place 1 drop into both eyes 2 (two) times daily. , Disp: , Rfl:  .  escitalopram (LEXAPRO) 10 MG tablet, Take 10 mg by mouth 2 (two) times daily. , Disp: , Rfl:  .  estradiol (ESTRACE) 2 MG tablet, Take 2 mg by mouth daily., Disp: , Rfl:  .  famotidine (PEPCID) 10 MG tablet, Take 10 mg by mouth 2 (two) times daily., Disp: , Rfl:  .  gabapentin (NEURONTIN) 300 MG capsule, Take 1 capsule (300 mg total) by mouth 3 (three) times daily., Disp: 180 capsule, Rfl: 0 .  hydrocortisone 2.5 % cream, Apply topically., Disp: , Rfl:  .  Insulin Human (INSULIN PUMP) SOLN, Inject 1 each into the skin. Insulin pump MEDTRONIC with humalog (Will be changing to TANDEM).Works with DEXCOM 4 CGM, Disp: , Rfl:  .  insulin lispro (HUMALOG) 100 UNIT/ML injection, Has with insulin pump, Disp: , Rfl:  .  lamoTRIgine (LAMICTAL) 150 MG tablet, TAKE 1 TABLET BY MOUTH TWICE DAILY, Disp: 180 tablet, Rfl: 3 .  lansoprazole (PREVACID) 30 MG capsule, Take 30 mg by mouth 2 (two) times daily before a meal., Disp: , Rfl:  .  levocetirizine (XYZAL) 5 MG tablet, Take 5 mg by mouth 2 (two) times daily. Takes OTC version per patient. Xyzal is not covered  By her insurance., Disp: , Rfl:  .  levothyroxine (SYNTHROID, LEVOTHROID) 112 MCG tablet, Take 112 mcg by mouth daily., Disp: , Rfl:  .  mometasone (NASONEX) 50 MCG/ACT nasal spray, Place 2  sprays into the nose daily. OTC, Disp: , Rfl:  .  montelukast (SINGULAIR) 10 MG tablet, Take 10 mg by mouth at bedtime., Disp: , Rfl:  .  Multiple Vitamins-Minerals (MULTIVITAMIN GUMMIES ADULT PO), Take 2 Doses by mouth daily., Disp: , Rfl:  .  Pancrelipase, Lip-Prot-Amyl, (PANCREAZE PO), Take by mouth., Disp: , Rfl:  .  primidone (MYSOLINE) 50 MG tablet, TAKE 1 TABLET BY MOUTH IN THE MORNING , 1 TABLET AT NOON AND ONE AND ONE-HALF (1 & 1/2) TABLETS IN THE EVENING, Disp: 315 tablet, Rfl: 3 .  rosuvastatin (CRESTOR) 20 MG tablet, Take 20 mg by mouth daily., Disp: , Rfl:  .  Simethicone (GAS-X EXTRA STRENGTH PO), Take 1 tablet by mouth as needed., Disp: , Rfl:   Social History   Tobacco Use  Smoking Status Never Smoker  Smokeless Tobacco Never Used    Allergies  Allergen Reactions  . Codeine   . Food     MSG and Lactose (cannot have any dairy products)   . Other Nausea And Vomiting    MSG and Lactose Intolerant  . Penicillins     She states she can have penicillin derivatives   Objective:  There were no vitals filed for this visit. There is no height or weight on file to calculate BMI. Constitutional Well developed. Well nourished.  Vascular Dorsalis pedis pulses palpable bilaterally. Posterior tibial pulses palpable bilaterally. Capillary refill normal to all digits.  No cyanosis or clubbing noted. Pedal hair growth normal.  Neurologic Normal speech. Oriented to person, place, and time. Epicritic sensation to light touch grossly present bilaterally.  Dermatologic  right posterior heel blister.  Superficial underlying wound noted.  No clinical signs of infection noted.  The blister was deroofed with no fluid collection.  No this is likely a frictional blister from her foot orthosis.  She does have a bilateral lower extremity foot drop likely causing the pressure in the posterior heel. Skin within normal limits  Orthopedic: Normal joint ROM without pain or crepitus  bilaterally. No visible deformities. No bony tenderness.   Radiographs: None Assessment:   1. Heel ulcer, right, with fat layer exposed (HCC)   2. Foot drop, bilateral    Plan:  Patient was evaluated and treated and all questions answered.  Right posterior heel which now has transitioned to ulcer with fat layer exposed secondary to worn-out orthosis for bilateral foot drop -Continue cam boot immobilization.   -Patient will continue to Betadine wet-to-dry dressing changes 3 times a week. -Given that this has transition to an ulcer with  fat layer exposed I will plan on continuing debridement until the ulceration has closed.  No clinical signs of infection noted.  Procedure: Excisional Debridement of Wound Tool: Sharp chisel blade/tissue nipper Rationale: Removal of non-viable soft tissue from the wound to promote healing.  Anesthesia: none Pre-Debridement Wound Measurements: 0.7 cm x 0.5 cm x 0.3 cm  Post-Debridement Wound Measurements: 0.9 cm x 0.6 cm x 0.3 cm  Type of Debridement: Sharp Excisional Tissue Removed: Non-viable soft tissue Blood loss: Minimal (<50cc) Depth of Debridement: subcutaneous tissue. Technique: Sharp excisional debridement to bleeding, viable wound base.  Wound Progress: This has now turned into ulceration.  I will continue to monitor the progression of it throughout multiple visits. Dressing: Dry, sterile, compression dressing. Disposition: Patient tolerated procedure well. Patient to return in 1 week for follow-up.  Bilateral foot drop -I explained patient the etiology of foot drop and various treatment options were discussed.  Patient is currently on [place] brace bilaterally however I believe patient will benefit from modification to the brace as they appear to be worn out.  If they are not able to be modified I believe patient will benefit from a new pair of brace.  Is causing her a frictional blister which now have resulted in ulceration.   Return for  see Raiford Noble for brace fitting.

## 2020-06-09 ENCOUNTER — Encounter (HOSPITAL_COMMUNITY): Payer: Self-pay

## 2020-06-09 ENCOUNTER — Encounter (HOSPITAL_BASED_OUTPATIENT_CLINIC_OR_DEPARTMENT_OTHER): Payer: Self-pay | Admitting: *Deleted

## 2020-06-09 ENCOUNTER — Emergency Department (HOSPITAL_COMMUNITY): Payer: Managed Care, Other (non HMO)

## 2020-06-09 ENCOUNTER — Ambulatory Visit: Payer: 59 | Admitting: Podiatry

## 2020-06-09 ENCOUNTER — Emergency Department (HOSPITAL_COMMUNITY)
Admission: EM | Admit: 2020-06-09 | Discharge: 2020-06-09 | Disposition: A | Discharge status: Home / Self Care | Payer: Managed Care, Other (non HMO) | Source: Home / Self Care

## 2020-06-09 ENCOUNTER — Encounter: Payer: Self-pay | Admitting: Podiatry

## 2020-06-09 ENCOUNTER — Other Ambulatory Visit: Payer: Self-pay

## 2020-06-09 ENCOUNTER — Inpatient Hospital Stay (HOSPITAL_BASED_OUTPATIENT_CLINIC_OR_DEPARTMENT_OTHER)
Admission: EM | Admit: 2020-06-09 | Discharge: 2020-06-13 | DRG: 638 | Disposition: A | Discharge status: Home Health | Payer: Managed Care, Other (non HMO) | Attending: Family Medicine | Admitting: Family Medicine

## 2020-06-09 DIAGNOSIS — Z8249 Family history of ischemic heart disease and other diseases of the circulatory system: Secondary | ICD-10-CM

## 2020-06-09 DIAGNOSIS — S90821A Blister (nonthermal), right foot, initial encounter: Secondary | ICD-10-CM | POA: Diagnosis not present

## 2020-06-09 DIAGNOSIS — E108 Type 1 diabetes mellitus with unspecified complications: Secondary | ICD-10-CM | POA: Diagnosis present

## 2020-06-09 DIAGNOSIS — K3184 Gastroparesis: Secondary | ICD-10-CM | POA: Diagnosis present

## 2020-06-09 DIAGNOSIS — N1831 Chronic kidney disease, stage 3a: Secondary | ICD-10-CM | POA: Diagnosis present

## 2020-06-09 DIAGNOSIS — E10319 Type 1 diabetes mellitus with unspecified diabetic retinopathy without macular edema: Secondary | ICD-10-CM | POA: Diagnosis present

## 2020-06-09 DIAGNOSIS — Z91018 Allergy to other foods: Secondary | ICD-10-CM

## 2020-06-09 DIAGNOSIS — L03115 Cellulitis of right lower limb: Secondary | ICD-10-CM

## 2020-06-09 DIAGNOSIS — L0390 Cellulitis, unspecified: Secondary | ICD-10-CM | POA: Diagnosis present

## 2020-06-09 DIAGNOSIS — E1140 Type 2 diabetes mellitus with diabetic neuropathy, unspecified: Secondary | ICD-10-CM | POA: Diagnosis present

## 2020-06-09 DIAGNOSIS — L97409 Non-pressure chronic ulcer of unspecified heel and midfoot with unspecified severity: Secondary | ICD-10-CM | POA: Diagnosis present

## 2020-06-09 DIAGNOSIS — Z79899 Other long term (current) drug therapy: Secondary | ICD-10-CM

## 2020-06-09 DIAGNOSIS — Z885 Allergy status to narcotic agent status: Secondary | ICD-10-CM

## 2020-06-09 DIAGNOSIS — G4733 Obstructive sleep apnea (adult) (pediatric): Secondary | ICD-10-CM | POA: Diagnosis present

## 2020-06-09 DIAGNOSIS — E11628 Type 2 diabetes mellitus with other skin complications: Secondary | ICD-10-CM | POA: Diagnosis present

## 2020-06-09 DIAGNOSIS — G250 Essential tremor: Secondary | ICD-10-CM | POA: Diagnosis present

## 2020-06-09 DIAGNOSIS — Z7982 Long term (current) use of aspirin: Secondary | ICD-10-CM

## 2020-06-09 DIAGNOSIS — K219 Gastro-esophageal reflux disease without esophagitis: Secondary | ICD-10-CM | POA: Diagnosis present

## 2020-06-09 DIAGNOSIS — E1043 Type 1 diabetes mellitus with diabetic autonomic (poly)neuropathy: Secondary | ICD-10-CM | POA: Diagnosis present

## 2020-06-09 DIAGNOSIS — Z880 Allergy status to penicillin: Secondary | ICD-10-CM

## 2020-06-09 DIAGNOSIS — Z823 Family history of stroke: Secondary | ICD-10-CM

## 2020-06-09 DIAGNOSIS — Z9641 Presence of insulin pump (external) (internal): Secondary | ICD-10-CM | POA: Diagnosis present

## 2020-06-09 DIAGNOSIS — I129 Hypertensive chronic kidney disease with stage 1 through stage 4 chronic kidney disease, or unspecified chronic kidney disease: Secondary | ICD-10-CM | POA: Diagnosis present

## 2020-06-09 DIAGNOSIS — Z20822 Contact with and (suspected) exposure to covid-19: Secondary | ICD-10-CM | POA: Diagnosis present

## 2020-06-09 DIAGNOSIS — L97519 Non-pressure chronic ulcer of other part of right foot with unspecified severity: Secondary | ICD-10-CM | POA: Insufficient documentation

## 2020-06-09 DIAGNOSIS — N189 Chronic kidney disease, unspecified: Secondary | ICD-10-CM | POA: Diagnosis present

## 2020-06-09 DIAGNOSIS — Z825 Family history of asthma and other chronic lower respiratory diseases: Secondary | ICD-10-CM

## 2020-06-09 DIAGNOSIS — F329 Major depressive disorder, single episode, unspecified: Secondary | ICD-10-CM | POA: Diagnosis present

## 2020-06-09 DIAGNOSIS — E10621 Type 1 diabetes mellitus with foot ulcer: Secondary | ICD-10-CM | POA: Diagnosis not present

## 2020-06-09 DIAGNOSIS — E039 Hypothyroidism, unspecified: Secondary | ICD-10-CM | POA: Diagnosis present

## 2020-06-09 DIAGNOSIS — E1022 Type 1 diabetes mellitus with diabetic chronic kidney disease: Secondary | ICD-10-CM | POA: Diagnosis present

## 2020-06-09 DIAGNOSIS — Z820 Family history of epilepsy and other diseases of the nervous system: Secondary | ICD-10-CM

## 2020-06-09 DIAGNOSIS — S91332A Puncture wound without foreign body, left foot, initial encounter: Secondary | ICD-10-CM

## 2020-06-09 DIAGNOSIS — E7800 Pure hypercholesterolemia, unspecified: Secondary | ICD-10-CM | POA: Diagnosis present

## 2020-06-09 DIAGNOSIS — L97419 Non-pressure chronic ulcer of right heel and midfoot with unspecified severity: Secondary | ICD-10-CM | POA: Diagnosis not present

## 2020-06-09 DIAGNOSIS — E785 Hyperlipidemia, unspecified: Secondary | ICD-10-CM | POA: Diagnosis present

## 2020-06-09 DIAGNOSIS — Z79890 Hormone replacement therapy (postmenopausal): Secondary | ICD-10-CM

## 2020-06-09 DIAGNOSIS — Z5321 Procedure and treatment not carried out due to patient leaving prior to being seen by health care provider: Secondary | ICD-10-CM | POA: Insufficient documentation

## 2020-06-09 DIAGNOSIS — E10628 Type 1 diabetes mellitus with other skin complications: Secondary | ICD-10-CM | POA: Diagnosis present

## 2020-06-09 DIAGNOSIS — Z833 Family history of diabetes mellitus: Secondary | ICD-10-CM

## 2020-06-09 DIAGNOSIS — M21371 Foot drop, right foot: Secondary | ICD-10-CM | POA: Diagnosis present

## 2020-06-09 DIAGNOSIS — Z794 Long term (current) use of insulin: Secondary | ICD-10-CM

## 2020-06-09 DIAGNOSIS — F32A Depression, unspecified: Secondary | ICD-10-CM | POA: Diagnosis present

## 2020-06-09 DIAGNOSIS — M21372 Foot drop, left foot: Secondary | ICD-10-CM | POA: Diagnosis present

## 2020-06-09 DIAGNOSIS — E11319 Type 2 diabetes mellitus with unspecified diabetic retinopathy without macular edema: Secondary | ICD-10-CM | POA: Diagnosis present

## 2020-06-09 LAB — CBC WITH DIFFERENTIAL/PLATELET
Abs Immature Granulocytes: 0.04 10*3/uL (ref 0.00–0.07)
Basophils Absolute: 0.1 10*3/uL (ref 0.0–0.1)
Basophils Relative: 1 %
Eosinophils Absolute: 0.5 10*3/uL (ref 0.0–0.5)
Eosinophils Relative: 5 %
HCT: 35.6 % — ABNORMAL LOW (ref 36.0–46.0)
Hemoglobin: 11.3 g/dL — ABNORMAL LOW (ref 12.0–15.0)
Immature Granulocytes: 0 %
Lymphocytes Relative: 28 %
Lymphs Abs: 2.9 10*3/uL (ref 0.7–4.0)
MCH: 31.8 pg (ref 26.0–34.0)
MCHC: 31.7 g/dL (ref 30.0–36.0)
MCV: 100.3 fL — ABNORMAL HIGH (ref 80.0–100.0)
Monocytes Absolute: 0.7 10*3/uL (ref 0.1–1.0)
Monocytes Relative: 7 %
Neutro Abs: 6.4 10*3/uL (ref 1.7–7.7)
Neutrophils Relative %: 59 %
Platelets: 313 10*3/uL (ref 150–400)
RBC: 3.55 MIL/uL — ABNORMAL LOW (ref 3.87–5.11)
RDW: 13 % (ref 11.5–15.5)
WBC: 10.6 10*3/uL — ABNORMAL HIGH (ref 4.0–10.5)
nRBC: 0 % (ref 0.0–0.2)

## 2020-06-09 LAB — COMPREHENSIVE METABOLIC PANEL
ALT: 17 U/L (ref 0–44)
AST: 23 U/L (ref 15–41)
Albumin: 3.2 g/dL — ABNORMAL LOW (ref 3.5–5.0)
Alkaline Phosphatase: 40 U/L (ref 38–126)
Anion gap: 12 (ref 5–15)
BUN: 12 mg/dL (ref 6–20)
CO2: 27 mmol/L (ref 22–32)
Calcium: 9.1 mg/dL (ref 8.9–10.3)
Chloride: 98 mmol/L (ref 98–111)
Creatinine, Ser: 1.4 mg/dL — ABNORMAL HIGH (ref 0.44–1.00)
GFR calc Af Amer: 48 mL/min — ABNORMAL LOW (ref >60)
GFR calc non Af Amer: 41 mL/min — ABNORMAL LOW (ref >60)
Glucose, Bld: 151 mg/dL — ABNORMAL HIGH (ref 70–99)
Potassium: 4.1 mmol/L (ref 3.5–5.1)
Sodium: 137 mmol/L (ref 135–145)
Total Bilirubin: 0.3 mg/dL (ref 0.3–1.2)
Total Protein: 6.7 g/dL (ref 6.5–8.1)

## 2020-06-09 LAB — LACTIC ACID, PLASMA: Lactic Acid, Venous: 1.7 mmol/L (ref 0.5–1.9)

## 2020-06-09 NOTE — ED Notes (Signed)
Spouse of the pt stated they been here too long and was not willing to wait any longer so they left.

## 2020-06-09 NOTE — ED Triage Notes (Signed)
Here for ulcer on her lateral right foot. She was seen by Dr Allena Katz today who lanced her ulcer and told her to go to the ED. She went to [facility] and left after long wait. Foot is wrapped in gauze.

## 2020-06-09 NOTE — ED Triage Notes (Signed)
Pt has ulcer to lateral side of right foot, pt seen by Dr patel today who lanced it and sent her here for further evaluation. Foot wrapped with gauze

## 2020-06-09 NOTE — Progress Notes (Signed)
Subjective:  Patient ID: [name], female    DOB: Feb 06, 1961,  MRN: [id number]  Chief Complaint  Patient presents with  . Foot Ulcer    "the heel seems to be alot better.  I have a new blister that came up on Friday and has gotten alot worse"  Her foot is red, swollen, warm to touch with large blister to lateral side of foot    59 y.o. female presents with the above complaint.  Patient is following up from her right heel ulceration which has healed really well.  There is no longer an ulceration present.  However she is here today for the new complaint of right lateral foot blister formation with cellulitis.  Patient states it started over the week and has progressive gotten worse.  She had to make an urgent appointment for her to be seen today.  She states that she has been wearing the boot.  She has not been wearing her brace.  She denies any other acute complaints.  She denies taking any antibiotics.  She wants to know what the future treatment plans are going to be.   Review of Systems: Negative except as noted in the HPI. Denies N/V/F/Ch.  Past Medical History:  Diagnosis Date  . Adhesive capsulitis 2013   Bilateral shoulders  . Adhesive capsulitis of both shoulders 2013  . Anemia   . Cataract   . Chronic kidney disease   . Depression   . Diabetes mellitus without complication (HCC)   . Diabetic retinopathy (HCC) 2010  . Diabetic retinopathy (HCC) 2010   Left eye, mild  . Essential tremor 2011   Bilateral hands  . Foot drop, bilateral 2007  . Foot fracture, left   . Gastroparesis 2009  . GERD (gastroesophageal reflux disease)   . Headache   . Hemorrhage 2011   Laser surgery x2  . High potassium 2012   Normal as of 2014  . Hypercholesterolemia 2010  . Hyperlipidemia   . Neuropathy   . Pneumonia   . Postnasal drip 2010  . Scratched cornea 2013   Left eye  . Sleep apnea    does not wear CPAP  . Stage 3 chronic kidney disease   . Thyroid disease   . TMJ  (temporomandibular joint disorder)   . Type 1 diabetes (HCC)     Current Outpatient Medications:  .  albuterol (PROVENTIL HFA;VENTOLIN HFA) 108 (90 BASE) MCG/ACT inhaler, Inhale 1 puff into the lungs as needed for wheezing or shortness of breath., Disp: , Rfl:  .  aspirin 81 MG chewable tablet, Chew 81 mg by mouth daily., Disp: , Rfl:  .  Carboxymethylcellul-Glycerin (REFRESH OPTIVE OP), Apply 1 drop to eye as needed., Disp: , Rfl:  .  Choline Fenofibrate (FENOFIBRIC ACID) 45 MG CPDR, Take 45 mg by mouth 3 (three) times daily., Disp: , Rfl:  .  cycloSPORINE (RESTASIS) 0.05 % ophthalmic emulsion, Place 1 drop into both eyes 2 (two) times daily. , Disp: , Rfl:  .  escitalopram (LEXAPRO) 10 MG tablet, Take 10 mg by mouth 2 (two) times daily. , Disp: , Rfl:  .  estradiol (ESTRACE) 2 MG tablet, Take 2 mg by mouth daily., Disp: , Rfl:  .  famotidine (PEPCID) 10 MG tablet, Take 10 mg by mouth 2 (two) times daily., Disp: , Rfl:  .  gabapentin (NEURONTIN) 300 MG capsule, Take 1 capsule (300 mg total) by mouth 3 (three) times daily., Disp: 180 capsule, Rfl: 0 .  hydrocortisone  2.5 % cream, Apply topically., Disp: , Rfl:  .  Insulin Human (INSULIN PUMP) SOLN, Inject 1 each into the skin. Insulin pump MEDTRONIC with humalog (Will be changing to TANDEM).Works with DEXCOM 4 CGM, Disp: , Rfl:  .  insulin lispro (HUMALOG) 100 UNIT/ML injection, Has with insulin pump, Disp: , Rfl:  .  lamoTRIgine (LAMICTAL) 150 MG tablet, TAKE 1 TABLET BY MOUTH TWICE DAILY, Disp: 180 tablet, Rfl: 3 .  lansoprazole (PREVACID) 30 MG capsule, Take 30 mg by mouth 2 (two) times daily before a meal., Disp: , Rfl:  .  levocetirizine (XYZAL) 5 MG tablet, Take 5 mg by mouth 2 (two) times daily. Takes OTC version per patient. Xyzal is not covered  By her insurance., Disp: , Rfl:  .  levothyroxine (SYNTHROID, LEVOTHROID) 112 MCG tablet, Take 112 mcg by mouth daily., Disp: , Rfl:  .  mometasone (NASONEX) 50 MCG/ACT nasal spray, Place 2  sprays into the nose daily. OTC, Disp: , Rfl:  .  montelukast (SINGULAIR) 10 MG tablet, Take 10 mg by mouth at bedtime., Disp: , Rfl:  .  Multiple Vitamins-Minerals (MULTIVITAMIN GUMMIES ADULT PO), Take 2 Doses by mouth daily., Disp: , Rfl:  .  Pancrelipase, Lip-Prot-Amyl, (PANCREAZE PO), Take by mouth., Disp: , Rfl:  .  primidone (MYSOLINE) 50 MG tablet, TAKE 1 TABLET BY MOUTH IN THE MORNING , 1 TABLET AT NOON AND ONE AND ONE-HALF (1 & 1/2) TABLETS IN THE EVENING, Disp: 315 tablet, Rfl: 3 .  rosuvastatin (CRESTOR) 20 MG tablet, Take 20 mg by mouth daily., Disp: , Rfl:  .  Simethicone (GAS-X EXTRA STRENGTH PO), Take 1 tablet by mouth as needed., Disp: , Rfl:   Social History   Tobacco Use  Smoking Status Never Smoker  Smokeless Tobacco Never Used    Allergies  Allergen Reactions  . Codeine   . Food     MSG and Lactose (cannot have any dairy products)   . Other Nausea And Vomiting    MSG and Lactose Intolerant  . Penicillins     She states she can have penicillin derivatives   Objective:  There were no vitals filed for this visit. There is no height or weight on file to calculate BMI. Constitutional Well developed. Well nourished.  Vascular Dorsalis pedis pulses palpable bilaterally. Posterior tibial pulses palpable bilaterally. Capillary refill normal to all digits.  No cyanosis or clubbing noted. Pedal hair growth normal.  Neurologic Normal speech. Oriented to person, place, and time. Epicritic sensation to light touch grossly present bilaterally.  Dermatologic  right lateral foot laceration status post deroofed with serosanguineous fluid.  No purulent fluid was drained.  Unable to palpate clinically deeper abscess.  No crepitus noted.  No malodor present.  Cellulitis up to the ankle and circumferentially around the foot.  Orthopedic: Normal joint ROM without pain or crepitus bilaterally. No visible deformities. No bony tenderness.   Radiographs: None Assessment:   1.  Friction blister of the foot, right, initial encounter   2. 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)   3. Cellulitis of right foot    Plan:  Patient was evaluated and treated and all questions answered.  Right lateral foot blister status post D movement with cellulitis -I explained to the patient the etiology of blister formation likely due to attribute it to the cam boot.  Given that the heel ulcer has healed patient can discontinue using cam boot.  However, there is greater than 2 cm of erythema consistent with  cellulitis present circuitry around the foot up to the level of the ankle.  I instructed patient go to the emergency room to be admitted for IV antibiotics and an MRI for deep abscess formation.  Clinically I was not able to appreciate superficial abscess.  As the friction blister was drained which revealed serosanguineous fluid.  No ulcerations noted at this time. -Patient is a diabetic with unknown last A1c with foot drop as well as other contractures of the right lower extremity.  Patient is a high risk of losing the limb if this infection continues to worsen. -Local wound care with Betadine wet-to-dry dressing changes. -Patient will benefit from an MRI evaluation for deeper abscess formation. -She will likely need 48 to 72 hours of IV antibiotics for a followed by p.o. antibiotics if there is no deeper abscess formation. -  No follow-ups on file.

## 2020-06-10 ENCOUNTER — Observation Stay (HOSPITAL_COMMUNITY): Payer: Managed Care, Other (non HMO)

## 2020-06-10 DIAGNOSIS — G4733 Obstructive sleep apnea (adult) (pediatric): Secondary | ICD-10-CM | POA: Diagnosis not present

## 2020-06-10 DIAGNOSIS — E1043 Type 1 diabetes mellitus with diabetic autonomic (poly)neuropathy: Secondary | ICD-10-CM | POA: Diagnosis not present

## 2020-06-10 DIAGNOSIS — E039 Hypothyroidism, unspecified: Secondary | ICD-10-CM | POA: Diagnosis not present

## 2020-06-10 DIAGNOSIS — K3184 Gastroparesis: Secondary | ICD-10-CM | POA: Diagnosis not present

## 2020-06-10 DIAGNOSIS — F329 Major depressive disorder, single episode, unspecified: Secondary | ICD-10-CM | POA: Diagnosis not present

## 2020-06-10 DIAGNOSIS — M21372 Foot drop, left foot: Secondary | ICD-10-CM | POA: Diagnosis not present

## 2020-06-10 DIAGNOSIS — L03115 Cellulitis of right lower limb: Secondary | ICD-10-CM | POA: Diagnosis not present

## 2020-06-10 DIAGNOSIS — M21371 Foot drop, right foot: Secondary | ICD-10-CM | POA: Diagnosis not present

## 2020-06-10 DIAGNOSIS — Z833 Family history of diabetes mellitus: Secondary | ICD-10-CM | POA: Diagnosis not present

## 2020-06-10 DIAGNOSIS — N1831 Chronic kidney disease, stage 3a: Secondary | ICD-10-CM | POA: Diagnosis not present

## 2020-06-10 DIAGNOSIS — Z79890 Hormone replacement therapy (postmenopausal): Secondary | ICD-10-CM | POA: Diagnosis not present

## 2020-06-10 DIAGNOSIS — E7800 Pure hypercholesterolemia, unspecified: Secondary | ICD-10-CM | POA: Diagnosis not present

## 2020-06-10 DIAGNOSIS — E10628 Type 1 diabetes mellitus with other skin complications: Secondary | ICD-10-CM | POA: Diagnosis not present

## 2020-06-10 DIAGNOSIS — E10621 Type 1 diabetes mellitus with foot ulcer: Secondary | ICD-10-CM | POA: Diagnosis not present

## 2020-06-10 DIAGNOSIS — E785 Hyperlipidemia, unspecified: Secondary | ICD-10-CM | POA: Diagnosis not present

## 2020-06-10 DIAGNOSIS — Z9641 Presence of insulin pump (external) (internal): Secondary | ICD-10-CM | POA: Diagnosis not present

## 2020-06-10 DIAGNOSIS — E11628 Type 2 diabetes mellitus with other skin complications: Secondary | ICD-10-CM | POA: Diagnosis not present

## 2020-06-10 DIAGNOSIS — E10319 Type 1 diabetes mellitus with unspecified diabetic retinopathy without macular edema: Secondary | ICD-10-CM | POA: Diagnosis not present

## 2020-06-10 DIAGNOSIS — Z7982 Long term (current) use of aspirin: Secondary | ICD-10-CM | POA: Diagnosis not present

## 2020-06-10 DIAGNOSIS — L089 Local infection of the skin and subcutaneous tissue, unspecified: Secondary | ICD-10-CM | POA: Diagnosis not present

## 2020-06-10 DIAGNOSIS — L97409 Non-pressure chronic ulcer of unspecified heel and midfoot with unspecified severity: Secondary | ICD-10-CM | POA: Diagnosis not present

## 2020-06-10 DIAGNOSIS — L97419 Non-pressure chronic ulcer of right heel and midfoot with unspecified severity: Secondary | ICD-10-CM | POA: Diagnosis present

## 2020-06-10 DIAGNOSIS — K219 Gastro-esophageal reflux disease without esophagitis: Secondary | ICD-10-CM | POA: Diagnosis not present

## 2020-06-10 DIAGNOSIS — E1022 Type 1 diabetes mellitus with diabetic chronic kidney disease: Secondary | ICD-10-CM | POA: Diagnosis not present

## 2020-06-10 DIAGNOSIS — Z20822 Contact with and (suspected) exposure to covid-19: Secondary | ICD-10-CM | POA: Diagnosis not present

## 2020-06-10 DIAGNOSIS — I129 Hypertensive chronic kidney disease with stage 1 through stage 4 chronic kidney disease, or unspecified chronic kidney disease: Secondary | ICD-10-CM | POA: Diagnosis not present

## 2020-06-10 DIAGNOSIS — G250 Essential tremor: Secondary | ICD-10-CM | POA: Diagnosis not present

## 2020-06-10 LAB — BASIC METABOLIC PANEL
Anion gap: 12 (ref 5–15)
BUN: 16 mg/dL (ref 6–20)
CO2: 28 mmol/L (ref 22–32)
Calcium: 9.1 mg/dL (ref 8.9–10.3)
Chloride: 100 mmol/L (ref 98–111)
Creatinine, Ser: 1.25 mg/dL — ABNORMAL HIGH (ref 0.44–1.00)
GFR calc Af Amer: 55 mL/min — ABNORMAL LOW (ref >60)
GFR calc non Af Amer: 47 mL/min — ABNORMAL LOW (ref >60)
Glucose, Bld: 114 mg/dL — ABNORMAL HIGH (ref 70–99)
Potassium: 4.2 mmol/L (ref 3.5–5.1)
Sodium: 140 mmol/L (ref 135–145)

## 2020-06-10 LAB — HEMOGLOBIN A1C
Hgb A1c MFr Bld: 5 % (ref 4.8–5.6)
Mean Plasma Glucose: 96.8 mg/dL

## 2020-06-10 LAB — CBC WITH DIFFERENTIAL/PLATELET
Abs Immature Granulocytes: 0.05 10*3/uL (ref 0.00–0.07)
Basophils Absolute: 0.1 10*3/uL (ref 0.0–0.1)
Basophils Relative: 1 %
Eosinophils Absolute: 0.5 10*3/uL (ref 0.0–0.5)
Eosinophils Relative: 4 %
HCT: 36.7 % (ref 36.0–46.0)
Hemoglobin: 11.7 g/dL — ABNORMAL LOW (ref 12.0–15.0)
Immature Granulocytes: 0 %
Lymphocytes Relative: 29 %
Lymphs Abs: 3.4 10*3/uL (ref 0.7–4.0)
MCH: 32 pg (ref 26.0–34.0)
MCHC: 31.9 g/dL (ref 30.0–36.0)
MCV: 100.3 fL — ABNORMAL HIGH (ref 80.0–100.0)
Monocytes Absolute: 0.9 10*3/uL (ref 0.1–1.0)
Monocytes Relative: 8 %
Neutro Abs: 7 10*3/uL (ref 1.7–7.7)
Neutrophils Relative %: 58 %
Platelets: 360 10*3/uL (ref 150–400)
RBC: 3.66 MIL/uL — ABNORMAL LOW (ref 3.87–5.11)
RDW: 13 % (ref 11.5–15.5)
WBC: 11.9 10*3/uL — ABNORMAL HIGH (ref 4.0–10.5)
nRBC: 0 % (ref 0.0–0.2)

## 2020-06-10 LAB — C-REACTIVE PROTEIN: CRP: 6.3 mg/dL — ABNORMAL HIGH (ref <1.0)

## 2020-06-10 LAB — PREALBUMIN: Prealbumin: 12.6 mg/dL — ABNORMAL LOW (ref 18–38)

## 2020-06-10 LAB — CBG MONITORING, ED
Glucose-Capillary: 135 mg/dL — ABNORMAL HIGH (ref 70–99)
Glucose-Capillary: 181 mg/dL — ABNORMAL HIGH (ref 70–99)

## 2020-06-10 LAB — SEDIMENTATION RATE: Sed Rate: 65 mm/hr — ABNORMAL HIGH (ref 0–22)

## 2020-06-10 LAB — HIV ANTIBODY (ROUTINE TESTING W REFLEX): HIV Screen 4th Generation wRfx: NONREACTIVE

## 2020-06-10 LAB — GLUCOSE, CAPILLARY: Glucose-Capillary: 204 mg/dL — ABNORMAL HIGH (ref 70–99)

## 2020-06-10 LAB — SARS CORONAVIRUS 2 BY RT PCR (HOSPITAL ORDER, PERFORMED IN ~~LOC~~ HOSPITAL LAB): SARS Coronavirus 2: NEGATIVE

## 2020-06-10 MED ORDER — ALBUTEROL SULFATE (2.5 MG/3ML) 0.083% IN NEBU: 2.5000 mg | INHALATION_SOLUTION | Freq: Four times a day (QID) | RESPIRATORY_TRACT | Status: DC | PRN

## 2020-06-10 MED ORDER — LAMOTRIGINE 25 MG PO TABS
150.0000 mg | ORAL_TABLET | Freq: Two times a day (BID) | ORAL | Status: DC
  Administered 2020-06-10 – 2020-06-13 (×6): 150 mg via ORAL
  Filled 2020-06-10 (×5): qty 2
  Filled 2020-06-10: qty 1

## 2020-06-10 MED ORDER — FAMOTIDINE 20 MG PO TABS
10.0000 mg | ORAL_TABLET | Freq: Two times a day (BID) | ORAL | Status: DC
  Administered 2020-06-10 – 2020-06-13 (×6): 10 mg via ORAL
  Filled 2020-06-10 (×6): qty 1

## 2020-06-10 MED ORDER — INSULIN PUMP
1.0000 | Freq: Three times a day (TID) | SUBCUTANEOUS | Status: DC
  Administered 2020-06-10 – 2020-06-13 (×7): 1 via SUBCUTANEOUS
  Filled 2020-06-10: qty 1

## 2020-06-10 MED ORDER — PANCRELIPASE (LIP-PROT-AMYL) 12000-38000 UNITS PO CPEP: 24000.0000 [IU] | ORAL_CAPSULE | Freq: Two times a day (BID) | ORAL | Status: DC | PRN

## 2020-06-10 MED ORDER — INSULIN LISPRO 100 UNIT/ML ~~LOC~~ SOLN
0.0000 [IU] | Freq: Three times a day (TID) | SUBCUTANEOUS | Status: DC
  Filled 2020-06-10: qty 10

## 2020-06-10 MED ORDER — ESCITALOPRAM OXALATE 10 MG PO TABS
10.0000 mg | ORAL_TABLET | Freq: Two times a day (BID) | ORAL | Status: DC
  Administered 2020-06-10 – 2020-06-13 (×6): 10 mg via ORAL
  Filled 2020-06-10 (×7): qty 1

## 2020-06-10 MED ORDER — FENOFIBRATE 160 MG PO TABS
160.0000 mg | ORAL_TABLET | Freq: Every day | ORAL | Status: DC
  Administered 2020-06-11 – 2020-06-13 (×3): 160 mg via ORAL
  Filled 2020-06-10 (×3): qty 1

## 2020-06-10 MED ORDER — CARBOXYMETHYLCELLUL-GLYCERIN 0.5-0.9 % OP SOLN: 1.0000 [drp] | OPHTHALMIC | Status: DC | PRN

## 2020-06-10 MED ORDER — METRONIDAZOLE IN NACL 5-0.79 MG/ML-% IV SOLN
500.0000 mg | Freq: Three times a day (TID) | INTRAVENOUS | Status: DC
  Administered 2020-06-10 – 2020-06-11 (×4): 500 mg via INTRAVENOUS
  Filled 2020-06-10 (×4): qty 100

## 2020-06-10 MED ORDER — LORATADINE 10 MG PO TABS
10.0000 mg | ORAL_TABLET | Freq: Every day | ORAL | Status: DC
  Administered 2020-06-12 – 2020-06-13 (×2): 10 mg via ORAL
  Filled 2020-06-10 (×2): qty 1

## 2020-06-10 MED ORDER — SIMETHICONE 80 MG PO CHEW
125.0000 mg | CHEWABLE_TABLET | ORAL | Status: DC | PRN
  Administered 2020-06-10: 120 mg via ORAL
  Filled 2020-06-10: qty 2

## 2020-06-10 MED ORDER — ALBUTEROL SULFATE HFA 108 (90 BASE) MCG/ACT IN AERS: 1.0000 | INHALATION_SPRAY | RESPIRATORY_TRACT | Status: DC | PRN

## 2020-06-10 MED ORDER — PRIMIDONE 50 MG PO TABS
50.0000 mg | ORAL_TABLET | Freq: Every morning | ORAL | Status: DC
  Administered 2020-06-11 – 2020-06-13 (×3): 50 mg via ORAL
  Filled 2020-06-10 (×4): qty 1

## 2020-06-10 MED ORDER — FENOFIBRIC ACID 45 MG PO CPDR: 45.0000 mg | DELAYED_RELEASE_CAPSULE | Freq: Three times a day (TID) | ORAL | Status: DC

## 2020-06-10 MED ORDER — ADULT MULTIVITAMIN W/MINERALS CH
1.0000 | ORAL_TABLET | Freq: Every day | ORAL | Status: DC
  Administered 2020-06-11 – 2020-06-13 (×3): 1 via ORAL
  Filled 2020-06-10 (×3): qty 1

## 2020-06-10 MED ORDER — FLUTICASONE PROPIONATE 50 MCG/ACT NA SUSP
2.0000 | Freq: Every day | NASAL | Status: DC
  Administered 2020-06-11 – 2020-06-13 (×2): 2 via NASAL
  Filled 2020-06-10: qty 16

## 2020-06-10 MED ORDER — GABAPENTIN 300 MG PO CAPS
300.0000 mg | ORAL_CAPSULE | Freq: Three times a day (TID) | ORAL | Status: DC
  Administered 2020-06-10 – 2020-06-13 (×9): 300 mg via ORAL
  Filled 2020-06-10 (×9): qty 1

## 2020-06-10 MED ORDER — LEVOCETIRIZINE DIHYDROCHLORIDE 5 MG PO TABS: 5.0000 mg | ORAL_TABLET | Freq: Two times a day (BID) | ORAL | Status: DC

## 2020-06-10 MED ORDER — ASPIRIN 81 MG PO CHEW
81.0000 mg | CHEWABLE_TABLET | Freq: Every day | ORAL | Status: DC
  Administered 2020-06-11 – 2020-06-13 (×3): 81 mg via ORAL
  Filled 2020-06-10 (×3): qty 1

## 2020-06-10 MED ORDER — PANCRELIPASE (LIP-PROT-AMYL) 12000-38000 UNITS PO CPEP
36000.0000 [IU] | ORAL_CAPSULE | Freq: Three times a day (TID) | ORAL | Status: DC
  Administered 2020-06-12 – 2020-06-13 (×2): 36000 [IU] via ORAL
  Administered 2020-06-13: 24000 [IU] via ORAL
  Filled 2020-06-10 (×4): qty 3

## 2020-06-10 MED ORDER — POLYVINYL ALCOHOL 1.4 % OP SOLN
1.0000 [drp] | OPHTHALMIC | Status: DC | PRN
  Filled 2020-06-10: qty 15

## 2020-06-10 MED ORDER — ROSUVASTATIN CALCIUM 20 MG PO TABS
20.0000 mg | ORAL_TABLET | Freq: Every day | ORAL | Status: DC
  Administered 2020-06-11 – 2020-06-13 (×3): 20 mg via ORAL
  Filled 2020-06-10 (×4): qty 1

## 2020-06-10 MED ORDER — PANCRELIPASE (LIP-PROT-AMYL) 2600-8800 UNITS PO CPEP: ORAL_CAPSULE | ORAL | Status: DC

## 2020-06-10 MED ORDER — SODIUM CHLORIDE 0.9 % IV SOLN
2.0000 g | Freq: Every day | INTRAVENOUS | Status: DC
  Administered 2020-06-10 – 2020-06-11 (×2): 2 g via INTRAVENOUS
  Filled 2020-06-10 (×2): qty 2

## 2020-06-10 MED ORDER — MULTIVITAMIN GUMMIES ADULT PO CHEW: CHEWABLE_TABLET | Freq: Every day | ORAL | Status: DC

## 2020-06-10 MED ORDER — ONDANSETRON HCL 4 MG/2ML IJ SOLN: 4.0000 mg | Freq: Four times a day (QID) | INTRAMUSCULAR | Status: DC | PRN

## 2020-06-10 MED ORDER — MONTELUKAST SODIUM 10 MG PO TABS
10.0000 mg | ORAL_TABLET | Freq: Every day | ORAL | Status: DC
  Administered 2020-06-10 – 2020-06-12 (×3): 10 mg via ORAL
  Filled 2020-06-10 (×3): qty 1

## 2020-06-10 MED ORDER — CYCLOSPORINE 0.05 % OP EMUL
1.0000 [drp] | Freq: Two times a day (BID) | OPHTHALMIC | Status: DC
  Administered 2020-06-10 – 2020-06-13 (×6): 1 [drp] via OPHTHALMIC
  Filled 2020-06-10 (×7): qty 1

## 2020-06-10 MED ORDER — INSULIN ASPART 100 UNIT/ML ~~LOC~~ SOLN: 0.0000 [IU] | Freq: Three times a day (TID) | SUBCUTANEOUS | Status: DC

## 2020-06-10 MED ORDER — LEVOTHYROXINE SODIUM 112 MCG PO TABS
112.0000 ug | ORAL_TABLET | Freq: Every day | ORAL | Status: DC
Start: 2020-06-10 — End: 2020-06-14
  Administered 2020-06-11 – 2020-06-13 (×3): 112 ug via ORAL
  Filled 2020-06-10 (×4): qty 1

## 2020-06-10 NOTE — ED Notes (Signed)
Report  Called to  [facility] and [facility] called Pts Monitor reading [phone] our CBG was [phone]

## 2020-06-10 NOTE — Consult Note (Signed)
WOC Nurse Consult Note: Reason for Consult: Right lateral ulceration, full thickness.  Patient presented to the podiatry office yesterday for a follow up appointment for a right heel ulcer that had healed and a new blister was noted. The blister was opened by Dr. Allena Katz (Podiatry) and patient referred to ED for possible admission and IV antibiotics. Dr. Allena Katz notes that the etiology of the ulceration is a pressure redistribution heel boot. Wound type: Infectious, MDRPI Pressure Injury POA: Yes Measurement: 4cm x 3cm affected area with central ulcer measuring 2cm x 2cm x 0.1cm Wound bed:red, moist Drainage (amount, consistency, odor) Serous, moderate amount Periwound:erythematous, edematous Dressing procedure/placement/frequency: I suspect that Dr. Allena Katz or other Podiatric MD will be seeing patient while in house.  I have provided Nursing with conservative care orders of NS dampened dressings with twice daily changes.  Any orders from the MD will supercede that of the WOC Nurse/this Clinical research associate.   WOC nursing team will not follow, but will remain available to this patient, the nursing and medical teams.  Please re-consult if needed. Thanks, Ladona Mow, MSN, RN, GNP, [facility]  Pager# [phone]

## 2020-06-10 NOTE — ED Provider Notes (Signed)
[facility] EMERGENCY DEPARTMENT Provider Note  CSN: [id number] Arrival date & time: 06/09/20 2112  Chief Complaint(s) Wound Check  HPI [name] is a 59 y.o. female with a past medical history listed below including type 1 diabetes and diabetic peripheral neuropathy requiring prosthetic braces and wheelchair who presents to the emergency department from the podiatrist's who recommended admission for assessment of deep tissue infection from right foot wound.  Patient reports that the wound began several days ago and is gradually worsened.  Went to the podiatrist today who incised a blister.  She denies any associated pain but reports has no sensation due to her neuropathy.  She denies any trauma or falls.  No fevers or chills.  No nausea or vomiting.  No other physical complaints.  Patient initially went to [facility] emergency department where they drew labs and obtain x-ray.  Due to the wait time she left and came here.  HPI  Past Medical History Past Medical History:  Diagnosis Date  . Adhesive capsulitis 2013   Bilateral shoulders  . Adhesive capsulitis of both shoulders 2013  . Anemia   . Cataract   . Chronic kidney disease   . Depression   . Diabetes mellitus without complication (HCC)   . Diabetic retinopathy (HCC) 2010  . Diabetic retinopathy (HCC) 2010   Left eye, mild  . Essential tremor 2011   Bilateral hands  . Foot drop, bilateral 2007  . Foot fracture, left   . Gastroparesis 2009  . GERD (gastroesophageal reflux disease)   . Headache   . Hemorrhage 2011   Laser surgery x2  . High potassium 2012   Normal as of 2014  . Hypercholesterolemia 2010  . Hyperlipidemia   . Neuropathy   . Pneumonia   . Postnasal drip 2010  . Scratched cornea 2013   Left eye  . Sleep apnea    does not wear CPAP  . Stage 3 chronic kidney disease   . Thyroid disease   . TMJ (temporomandibular joint disorder)   . Type 1 diabetes [facility])    Patient Active Problem List     Diagnosis Date Noted  . Diabetic foot infection (HCC) 06/10/2020  . 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 Special screening for malignant neoplasms, colon 01/24/2017  . Difficulty in swallowing 01/24/2017  . Esophageal reflux 01/24/2017  . DM (diabetes mellitus) type 1 with ketoacidosis (HCC) 03/01/2015  . 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) 03/01/2015  . Diabetic neuropathy (HCC) 03/01/2015  . Hypothyroidism 03/01/2015  . Depression 03/01/2015  . Orthostatic hypotension 03/01/2015  . Essential tremor 03/01/2015  . Diabetic retinopathy (HCC) 03/01/2015  . Gastroparesis due to DM (HCC) 03/01/2015  . Chronic renal insufficiency 03/01/2015  . Foot drop, bilateral 03/01/2015  . HLD (hyperlipidemia) 03/01/2015  . Obstructive sleep apnea 03/01/2015   Home Medication(s) Prior to Admission medications   Medication Sig Start Date End Date Taking? Authorizing Provider  albuterol (PROVENTIL HFA;VENTOLIN HFA) 108 (90 BASE) MCG/ACT inhaler Inhale 1 puff into the lungs as needed for wheezing or shortness of breath.    [provider]  aspirin 81 MG chewable tablet Chew 81 mg by mouth daily.    [provider]  Carboxymethylcellul-Glycerin (REFRESH OPTIVE OP) Apply 1 drop to eye as needed.    [provider]  Choline Fenofibrate (FENOFIBRIC ACID) 45 MG CPDR Take 45 mg by mouth 3 (three) times daily.    [provider]  cycloSPORINE (RESTASIS)  0.05 % ophthalmic emulsion Place 1 drop into both eyes 2 (two) times daily.     [provider]  escitalopram (LEXAPRO) 10 MG tablet Take 10 mg by mouth 2 (two) times daily.  12/10/14   [provider]  estradiol (ESTRACE) 2 MG tablet Take 2 mg by mouth daily.    [provider]  famotidine (PEPCID) 10 MG tablet Take 10 mg by mouth 2 (two) times daily.    [provider]  gabapentin (NEURONTIN) 300 MG capsule Take 1 capsule (300 mg  total) by mouth 3 (three) times daily. 02/26/20   Lomax, Amy, NP  hydrocortisone 2.5 % cream Apply topically. 05/19/20   [provider]  Insulin Human (INSULIN PUMP) SOLN Inject 1 each into the skin. 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    [provider]  levocetirizine (XYZAL) 5 MG tablet Take 5 mg by mouth 2 (two) times daily. Takes OTC version per patient. Xyzal is not covered  By her insurance.    [provider]  levothyroxine (SYNTHROID, LEVOTHROID) 112 MCG tablet Take 112 mcg by mouth daily. 06/23/15   [provider]  mometasone (NASONEX) 50 MCG/ACT nasal spray Place 2 sprays into the nose daily. OTC    [provider]  montelukast (SINGULAIR) 10 MG tablet Take 10 mg by mouth at bedtime.    [provider]  Multiple Vitamins-Minerals (MULTIVITAMIN GUMMIES ADULT PO) Take 2 Doses by mouth daily.    [provider]  Pancrelipase, Lip-Prot-Amyl, (PANCREAZE PO) Take by mouth.    [provider]  primidone (MYSOLINE) 50 MG tablet TAKE 1 TABLET BY MOUTH IN THE MORNING , 1 TABLET AT NOON AND ONE AND ONE-HALF (1 & 1/2) TABLETS IN THE EVENING 04/16/19   Lomax, Amy, NP  rosuvastatin (CRESTOR) 20 MG tablet Take 20 mg by mouth daily.    [provider]  Simethicone (GAS-X EXTRA STRENGTH PO) Take 1 tablet by mouth as needed.    [provider]                                                                                                                                    Past Surgical History Past Surgical History:  Procedure Laterality Date  .  ABDOMINAL HYSTERECTOMY  2007  . APPENDECTOMY    . BALLOON DILATION N/A 01/24/2017   Procedure: BALLOON DILATION;  Surgeon: Charlott RakesVincent Schooler, MD;  Location: [facility];  Service: Endoscopy;  Laterality: N/A;  . CARPAL TUNNEL RELEASE Right 1990  .  CATARACT EXTRACTION Bilateral 2008  . COLONOSCOPY  2007  . COLONOSCOPY WITH PROPOFOL N/A 01/24/2017   Procedure: COLONOSCOPY WITH PROPOFOL;  Surgeon: Charlott Rakes, MD;  Location: [facility];  Service: Endoscopy;  Laterality: N/A;  . ESOPHAGOGASTRODUODENOSCOPY (EGD) WITH PROPOFOL N/A 01/24/2017   Procedure: ESOPHAGOGASTRODUODENOSCOPY (EGD) WITH PROPOFOL;  Surgeon: Charlott Rakes, MD;  Location: [facility];  Service: Endoscopy;  Laterality: N/A;  moved oer MD request  . EYE SURGERY Left 2012   Pars plana viterctomy, membrane peel, endolaser  . ROOT CANAL  2007  . ROOT CANAL  2010  . Thorat dilation  2007  . Throat dilation  2007  . Throat dilation  2010, 0ct 2011   Sep 2010, Nov 2010, 2014  . TONSILLECTOMY  1971  . Trigger fingers  2008  . WISDOM TOOTH EXTRACTION  1977   Family History Family History  Problem Relation Age of Onset  . Hypertension Mother   . Diabetes Father   . COPD Father   . Pneumonia Father   . Stroke Maternal Grandmother   . Hypertension Maternal Grandmother   . Diabetes Maternal Grandfather   . Parkinson's disease Maternal Grandfather   . Heart disease Maternal Grandfather   . Parkinsonism Neg Hx   . Neuropathy Neg Hx   . Breast cancer Neg Hx     Social History Social History   Tobacco Use  . Smoking status: Never Smoker  . Smokeless tobacco: Never Used  Vaping Use  . Vaping Use: Never used  Substance Use Topics  . Alcohol use: No    Alcohol/week: 0.0 standard drinks  . Drug use: No   Allergies Codeine, Food, Other, and Penicillins  Review of Systems Review of Systems All other systems are reviewed and are negative for acute change except as noted in the HPI  Physical Exam Vital Signs    I have reviewed the triage vital signs BP 102/66   Pulse 74   Temp 98.9 F (37.2 C) (Oral)   Resp 20   Ht 5\' 4"  (1.626 m)   Wt 74 kg   SpO2 94%   BMI 28.00 kg/m   Physical Exam Vitals reviewed.  Constitutional:      General: She is not in acute distress.    Appearance: She is well-developed. She is not diaphoretic.  HENT:     Head: Normocephalic and atraumatic.     Nose: Nose normal.  Eyes:     General: No scleral icterus.       Right eye: No discharge.        Left eye: No discharge.     Conjunctiva/sclera: Conjunctivae normal.     Pupils: Pupils are equal, round, and reactive to light.  Cardiovascular:     Rate and Rhythm: Normal rate and regular rhythm.     Heart sounds: No murmur heard.  No friction rub. No gallop.   Pulmonary:     Effort: Pulmonary effort is normal. No respiratory distress.     Breath sounds: Normal breath sounds. No stridor. No rales.  Abdominal:     General: There is no distension.     Palpations: Abdomen is soft.     Tenderness: There is no abdominal tenderness.  Musculoskeletal:        General: No tenderness.     Cervical back: Normal range of motion and neck supple.  Feet:     Right foot:     Skin integrity: Ulcer, skin breakdown and erythema present.     Comments:  See image Skin:    General: Skin is warm and dry.     Findings: No erythema or rash.  Neurological:     Mental Status: She is alert and oriented to person, place, and time.       ED Results and Treatments Labs (all labs ordered are listed, but only abnormal results are displayed) Labs Reviewed  CBG MONITORING, ED - Abnormal; Notable for the following components:      Result Value   Glucose-Capillary 135 (*)    All other components within normal limits  SARS CORONAVIRUS 2 BY RT PCR (HOSPITAL ORDER, PERFORMED IN [facility])                                                                                                                         EKG  EKG  Interpretation  Date/Time:    Ventricular Rate:    PR Interval:    QRS Duration:   QT Interval:    QTC Calculation:   R Axis:     Text Interpretation:        Radiology DG Foot Complete Right  Result Date: 06/09/2020 CLINICAL DATA:  Possible infection, ulcer on the lateral corner of the foot. EXAM: RIGHT FOOT COMPLETE - 3+ VIEW COMPARISON:  May 02, 2020 FINDINGS: No fracture seen. Again noted is cortical erosive type changes seen at the fifth metatarsal head with a possible healed fracture deformity, as on the prior exam. Overlying area of ulceration and soft tissue swelling is seen. No other areas of cortical destruction are seen. There is mild periosteal reaction seen in the fourth metatarsal shaft. Dorsal subcutaneous edema is noted. IMPRESSION: Findings suggestive of probable subacute/chronic osteomyelitis of the fifth metatarsal head with a healed fracture deformity as on the prior exam. Overlying area of ulceration and soft tissue swelling is seen. Electronically Signed   By: Jonna Clark M.D.   On: 06/09/2020 15:59    Pertinent labs & imaging results that were available during my care of the patient were reviewed by me and considered in my medical decision making (see chart for details).  Medications Ordered in ED Medications - No data to display                                                                                                                                  Procedures Procedures  (including critical care time)  Medical Decision Making / ED Course I have reviewed the nursing notes for this encounter and the patient's prior records (if available in EHR or on provided paperwork).   SHILA KRUCZEK was evaluated in Emergency Department on 06/10/2020 for the symptoms described in the history of present illness. She was evaluated in the context of the global COVID-19 pandemic, which necessitated consideration that the patient might be at risk for infection with the  SARS-CoV-2 virus that causes COVID-19. Institutional protocols and algorithms that pertain to the evaluation of patients at risk for COVID-19 are in a state of rapid change based on information released by regulatory bodies including the CDC and federal and state organizations. These policies and algorithms were followed during the patient's care in the ED.  Labs revealed mild leukocytosis. No significant electrolyte derangements. Lactic acid normal.  Plain film suspicious for osteomyelitis.  Patient admitted to hospitalist service for further work-up and management.     Final Clinical Impression(s) / ED Diagnoses Final diagnoses:  Penetrating wound of left foot, initial encounter      This chart was dictated using voice recognition software.  Despite best efforts to proofread,  errors can occur which can change the documentation meaning.   Nira Conn, MD 06/10/20 [phone]

## 2020-06-10 NOTE — H&P (Signed)
[name] is an 59 y.o. female.   Chief Complaint: Blistering and ulceration of lateral aspect of right heel. HPI: The patient is a 59 yr old woman with DM I with multiple related comorbidities including drop foot, neuropathies, gastroparesis, and retinopathy. She had been wearing a CAM boot on the right extremity whilel the ulcer on the medial aspect of the heel was healing. The patient states that she has been treating the medial ulceration with betadine, and that it was getting better. Then this week she took off the boot to find a blood soaked sock and a new ulceration on the lateral aspect of the heel. She went to see her podiatrist, Dr. Allena [name] yesterday. He apparently unroofed the blister that was present on the lateral side and told her to go to [facility] to be admitted. Unfortunately, the wait at [facility] was too long and she left out of frustration and came here today as a direct admit instead. I have discussed the patient with Dr. Allena [name]. He will come to see the patient tomorrow.  The patient's past medical history is significant for DM I with retinopathy, CKD, neuropathy, drop foot, and gastroparesis. She uses DexCom4 and an insulin pump with humalog to control her diabetes.  The patient denies fevers, chills, nausea, vomiting, diarrhea, or pain. In fact she has no sensation to the right lower extremity. She also denies chest pain, cough, shortness of breath or other lesion or sore.  Past Medical History:  Diagnosis Date   Adhesive capsulitis 2013   Bilateral shoulders   Adhesive capsulitis of both shoulders 2013   Anemia    Cataract    Chronic kidney disease    Depression    Diabetes mellitus without complication (HCC)    Diabetic retinopathy (HCC) 2010   Diabetic retinopathy (HCC) 2010   Left eye, mild   Essential tremor 2011   Bilateral hands   Foot drop, bilateral 2007   Foot fracture, left    Gastroparesis 2009   GERD (gastroesophageal reflux disease)     Headache    Hemorrhage 2011   Laser surgery x2   High potassium 2012   Normal as of 2014   Hypercholesterolemia 2010   Hyperlipidemia    Neuropathy    Pneumonia    Postnasal drip 2010   Scratched cornea 2013   Left eye   Sleep apnea    does not wear CPAP   Stage 3 chronic kidney disease    Thyroid disease    TMJ (temporomandibular joint disorder)    Type 1 diabetes (HCC)     Past Surgical History:  Procedure Laterality Date   ABDOMINAL HYSTERECTOMY  2007   APPENDECTOMY     BALLOON DILATION N/A 01/24/2017   Procedure: BALLOON DILATION;  Surgeon: Charlott Rakes, MD;  Location: [facility] ENDOSCOPY;  Service: Endoscopy;  Laterality: N/A;   CARPAL TUNNEL RELEASE Right 1990   CATARACT EXTRACTION Bilateral 2008   COLONOSCOPY  2007   COLONOSCOPY WITH PROPOFOL N/A 01/24/2017   Procedure: COLONOSCOPY WITH PROPOFOL;  Surgeon: Charlott Rakes, MD;  Location: [facility] ENDOSCOPY;  Service: Endoscopy;  Laterality: N/A;   ESOPHAGOGASTRODUODENOSCOPY (EGD) WITH PROPOFOL N/A 01/24/2017   Procedure: ESOPHAGOGASTRODUODENOSCOPY (EGD) WITH PROPOFOL;  Surgeon: Charlott Rakes, MD;  Location: [facility] ENDOSCOPY;  Service: Endoscopy;  Laterality: N/A;  moved oer MD request   EYE SURGERY Left 2012   Pars plana viterctomy, membrane peel, endolaser   ROOT CANAL  2007   ROOT CANAL  2010   Thorat dilation  2007   Throat dilation  2007   Throat dilation  2010, 0ct 2011   Sep 2010, Nov 2010, 2014   TONSILLECTOMY  1971   Trigger fingers  2008   WISDOM TOOTH EXTRACTION  1977    Family History  Problem Relation Age of Onset   Hypertension Mother    Diabetes Father    COPD Father    Pneumonia Father    Stroke Maternal Grandmother    Hypertension Maternal Grandmother    Diabetes Maternal Grandfather    Parkinson's disease Maternal Grandfather    Heart disease Maternal Grandfather    Parkinsonism Neg Hx    Neuropathy Neg Hx    Breast cancer Neg Hx    Social History:   reports that she has never smoked. She has never used smokeless tobacco. She reports that she does not drink alcohol and does not use drugs. Medications Prior to Admission  Medication Sig Dispense Refill   albuterol (PROVENTIL HFA;VENTOLIN HFA) 108 (90 BASE) MCG/ACT inhaler Inhale 1 puff into the lungs as needed for wheezing or shortness of breath.     aspirin 81 MG chewable tablet Chew 81 mg by mouth daily.     Carboxymethylcellul-Glycerin (REFRESH OPTIVE OP) Place 1 drop into both eyes as needed (dry eyes).      Choline Fenofibrate (FENOFIBRIC ACID) 45 MG CPDR Take 45 mg by mouth 3 (three) times daily.     cycloSPORINE (RESTASIS) 0.05 % ophthalmic emulsion Place 1 drop into both eyes 2 (two) times daily.      escitalopram (LEXAPRO) 10 MG tablet Take 10 mg by mouth 2 (two) times daily.      estradiol (ESTRACE) 2 MG tablet Take 2 mg by mouth daily.     famotidine (PEPCID) 10 MG tablet Take 10 mg by mouth 2 (two) times daily.     gabapentin (NEURONTIN) 300 MG capsule Take 1 capsule (300 mg total) by mouth 3 (three) times daily. 180 capsule 0   hydrocortisone 2.5 % cream Apply 1 application topically daily as needed (itching).      Insulin Human (INSULIN PUMP) SOLN Inject 1 each into the skin. Insulin pump MEDTRONIC with humalog (Will be changing to TANDEM).Works with DEXCOM 4 CGM     insulin lispro (HUMALOG) 100 UNIT/ML injection Has with insulin pump     lamoTRIgine (LAMICTAL) 150 MG tablet TAKE 1 TABLET BY MOUTH TWICE DAILY (Patient taking differently: Take 150 mg by mouth 2 (two) times daily. ) 180 tablet 3   lansoprazole (PREVACID) 30 MG capsule Take 30 mg by mouth 2 (two) times daily before a meal.     levocetirizine (XYZAL) 5 MG tablet Take 5 mg by mouth 2 (two) times daily.      levothyroxine (SYNTHROID, LEVOTHROID) 112 MCG tablet Take 112 mcg by mouth daily.     mometasone (NASONEX) 50 MCG/ACT nasal spray Place 2 sprays into the nose daily. OTC     montelukast (SINGULAIR)  10 MG tablet Take 10 mg by mouth at bedtime.     Multiple Vitamins-Minerals (MULTIVITAMIN GUMMIES ADULT PO) Take 2 each by mouth daily.      Pancrelipase, Lip-Prot-Amyl, (PANCREAZE PO) Take 3-4 capsules by mouth See admin instructions. Take 4 capsules with a meal and 3 capsules with a snack     primidone (MYSOLINE) 50 MG tablet TAKE 1 TABLET BY MOUTH IN THE MORNING , 1 TABLET AT NOON AND ONE AND ONE-HALF (1 & 1/2) TABLETS IN THE EVENING (Patient taking differently: Take 50-75  mg by mouth See admin instructions. Take 50mg  in the morning, 50mg  at noon and 75mg  in the evening.) 315 tablet 3   rosuvastatin (CRESTOR) 40 MG tablet Take 40 mg by mouth every evening.      Simethicone (GAS-X EXTRA STRENGTH PO) Take 1 tablet by mouth as needed (gas relief).       Allergies:  Allergies  Allergen Reactions   Codeine    Food     MSG and Lactose (cannot have any dairy products)    Other Nausea And Vomiting    MSG and Lactose Intolerant   Penicillins     She states she can have penicillin derivatives    Pertinent items noted in HPI and remainder of comprehensive ROS otherwise negative.   General appearance: alert, cooperative, appears stated age and no distress Head: Normocephalic, without obvious abnormality, atraumatic Eyes: conjunctivae/corneas clear. PERRL, EOM's intact. Fundi benign. Throat: lips, mucosa, and tongue normal; teeth and gums normal Neck: no adenopathy, no carotid bruit, no JVD, supple, symmetrical, trachea midline and thyroid not enlarged, symmetric, no tenderness/mass/nodules Resp: No increased work of breathing. No wheezes, rales, or rhonchi. No tactile fremitus. Chest wall: no tenderness Cardio: regular rate and rhythm, S1, S2 normal, no murmur, click, rub or gallop GI: soft, non-tender; bowel sounds normal; no masses,  no organomegaly Extremities: extremities normal, atraumatic, no cyanosis or edema and Righth lower extremity is bandaged. There is a contracture of the  right lower extremity. Pulses: 2+ and symmetric Skin: Skin color, texture, turgor normal. No rashes or lesions Lymph nodes: Cervical, supraclavicular, and axillary nodes normal. Neurologic: Mental status: Alert, oriented, thought content appropriate CN II-XII grossly intact.  Incision/Wound: Wounds to medial and lateral aspects of the heel of the patient's right foot.  Results for orders placed or performed during the hospital encounter of 06/09/20 (from the past 48 hour(s))  SARS Coronavirus 2 by RT PCR (hospital order, [facility]) Nasopharyngeal Nasopharyngeal Swab     Status: None   Collection Time: 06/10/20  2:18 AM   Specimen: Nasopharyngeal Swab  Result Value Ref Range   SARS Coronavirus 2 NEGATIVE NEGATIVE    Comment: (NOTE) SARS-CoV-2 target nucleic acids are NOT DETECTED.  The SARS-CoV-2 RNA is generally detectable in upper and lower respiratory specimens during the acute phase of infection. The lowest concentration of SARS-CoV-2 viral copies this assay can detect is 250 copies / mL. A negative result does not preclude SARS-CoV-2 infection and should not be used as the sole basis for treatment or other patient management decisions.  A negative result may occur with improper specimen collection / handling, submission of specimen other than nasopharyngeal swab, presence of viral mutation(s) within the areas targeted by this assay, and inadequate number of viral copies (<250 copies / mL). A negative result must be combined with clinical observations, patient history, and epidemiological information.  Fact Sheet for Patients:   BoilerBrush.com.cyhttps://www.fda.gov/media/136312/download  Fact Sheet for Healthcare Providers: https://pope.com/https://www.fda.gov/media/136313/download  This test is not yet approved or  cleared by the [place] FDA and has been authorized for detection and/or diagnosis of SARS-CoV-2 by FDA under an Emergency Use Authorization (EUA).  This EUA will  remain in effect (meaning this test can be used) for the duration of the COVID-19 declaration under Section 564(b)(1) of the Act, 21 U.S.C. section 360bbb-3(b)(1), unless the authorization is terminated or revoked sooner.  Performed at [facility] [place], [address] [place], [place] [id number]   CBG monitoring, ED  Status: Abnormal   Collection Time: 06/10/20  7:16 AM  Result Value Ref Range   Glucose-Capillary 135 (H) 70 - 99 mg/dL    Comment: Glucose reference range applies only to samples taken after fasting for at least 8 hours.  CBG monitoring, ED     Status: Abnormal   Collection Time: 06/10/20  1:10 PM  Result Value Ref Range   Glucose-Capillary 181 (H) 70 - 99 mg/dL    Comment: Glucose reference range applies only to samples taken after fasting for at least 8 hours.  CBC with Differential     Status: Abnormal   Collection Time: 06/10/20  4:38 PM  Result Value Ref Range   WBC 11.9 (H) 4.0 - 10.5 K/uL   RBC 3.66 (L) 3.87 - 5.11 MIL/uL   Hemoglobin 11.7 (L) 12.0 - 15.0 g/dL   HCT 96.0 36 - 46 %   MCV 100.3 (H) 80.0 - 100.0 fL   MCH 32.0 26.0 - 34.0 pg   MCHC 31.9 30.0 - 36.0 g/dL   RDW 45.4 09.8 - 11.9 %   Platelets 360 150 - 400 K/uL   nRBC 0.0 0.0 - 0.2 %   Neutrophils Relative % 58 %   Neutro Abs 7.0 1.7 - 7.7 K/uL   Lymphocytes Relative 29 %   Lymphs Abs 3.4 0.7 - 4.0 K/uL   Monocytes Relative 8 %   Monocytes Absolute 0.9 0 - 1 K/uL   Eosinophils Relative 4 %   Eosinophils Absolute 0.5 0 - 0 K/uL   Basophils Relative 1 %   Basophils Absolute 0.1 0 - 0 K/uL   Immature Granulocytes 0 %   Abs Immature Granulocytes 0.05 0.00 - 0.07 K/uL    Comment: Performed at [facility], [address] [place], [place] [address]   [email or web address] @  Blood pressure 113/66, pulse 67, temperature 98.3 F (36.8 C), temperature source Oral, resp. rate 16, height 5\' 4"  (1.626 m), weight 74 kg, SpO2 96 %.    Assessment/Plan Problem  Diabetic Foot  Infection (Hcc)  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)  Diabetic Neuropathy (Hcc)  Hypothyroidism  Depression  Diabetic retinopathy (HCC)  Gastroparesis due to DM (HCC)  Chronic Renal Insufficiency  Foot Drop, Bilateral  Hld (Hyperlipidemia)  Obstructive Sleep Apnea   Diabetic foot infection: The patient has an ulceration of the medial aspect of her right heel. She was given a CAM boot to wear while it healed. However, this boot has rubbed a new blister on the lateral aspect of the right heel. The patient has been admitted. She will receive IV vancomycin and meropenem. Blood cultures x 2 will be collected. At the recommendation of Dr. , I have ordered MRI of the foot. He will be seeing her tomorrow. ABI's have also been ordered.  DM I: Resulting in multiple co-morbidities including neuropathy, gastroparesis, foot drop, and retinopathy. Patient manages glucoses with DexCom 4 sensor and an insulin pump. I have explained that we will be following her glucoses with FSBS as well. I have explained to her that if for any reason we question the reliability of the DexCom devise, we will manage her glucoses without it.  Hypothyroidism: Continue synthroid as at home.  CKDIIb: Noted. Monitor creatinine and electrolytes. Medications will be renally dosed. Nephrotoxins and hypotension will be avoided.   Obstructive Sleep Apnea: Pt does not use CPAP.  Hyperlipidemia: Continue statin.  I have seen and examined this patient myself. I have spent 78 minutes in her  evaluation and care.  DVT Prophylaxis: Heparin CODE STATUS: Full Code Family Communication: None available Disposition: Admitted from home to a med/surg bed as an observation. Anticipate discharge to home.   Status is: Observation  The patient remains OBS appropriate and will d/c before 2 midnights.  Dispo: The patient is from: Home              Anticipated d/c is to: Home              Anticipated d/c date is: 1 day               Patient currently is not medically stable to d/c.  Iyad Deroo 06/10/2020, 5:21 PM

## 2020-06-10 NOTE — ED Notes (Signed)
Pt assisted up to BSP to void , wants to sit on side of bed for a while , has call bell, given ice, states husband is bringing pretzels , sttaes came here cause she had waited 6 hours at [facility] yesterday  and was told it may be another 6 before she got  A bed

## 2020-06-10 NOTE — ED Notes (Signed)
When speaking with patient about her current home medications she reports taking all morning medication that she brought with her. Educated patient about only taking medication that we give her for the rest of her stay, pt agrees.

## 2020-06-10 NOTE — ED Notes (Signed)
[facility] here to take pt to [facility], pt is aware of room change

## 2020-06-10 NOTE — ED Notes (Signed)
ED MD aware pt is on home insulin pump, agrees to have her remain on this. This RN offered pt breakfast, pt declined reporting she ate food she had in her bag from home.

## 2020-06-11 ENCOUNTER — Observation Stay (HOSPITAL_COMMUNITY): Payer: Managed Care, Other (non HMO)

## 2020-06-11 DIAGNOSIS — F329 Major depressive disorder, single episode, unspecified: Secondary | ICD-10-CM | POA: Diagnosis present

## 2020-06-11 DIAGNOSIS — L97419 Non-pressure chronic ulcer of right heel and midfoot with unspecified severity: Secondary | ICD-10-CM | POA: Diagnosis present

## 2020-06-11 DIAGNOSIS — N1831 Chronic kidney disease, stage 3a: Secondary | ICD-10-CM | POA: Diagnosis present

## 2020-06-11 DIAGNOSIS — G250 Essential tremor: Secondary | ICD-10-CM | POA: Diagnosis present

## 2020-06-11 DIAGNOSIS — E108 Type 1 diabetes mellitus with unspecified complications: Secondary | ICD-10-CM

## 2020-06-11 DIAGNOSIS — E7800 Pure hypercholesterolemia, unspecified: Secondary | ICD-10-CM | POA: Diagnosis present

## 2020-06-11 DIAGNOSIS — L089 Local infection of the skin and subcutaneous tissue, unspecified: Secondary | ICD-10-CM

## 2020-06-11 DIAGNOSIS — G4733 Obstructive sleep apnea (adult) (pediatric): Secondary | ICD-10-CM | POA: Diagnosis present

## 2020-06-11 DIAGNOSIS — E11628 Type 2 diabetes mellitus with other skin complications: Secondary | ICD-10-CM | POA: Diagnosis not present

## 2020-06-11 DIAGNOSIS — Z7982 Long term (current) use of aspirin: Secondary | ICD-10-CM | POA: Diagnosis not present

## 2020-06-11 DIAGNOSIS — L03115 Cellulitis of right lower limb: Secondary | ICD-10-CM | POA: Diagnosis present

## 2020-06-11 DIAGNOSIS — Z9641 Presence of insulin pump (external) (internal): Secondary | ICD-10-CM | POA: Diagnosis present

## 2020-06-11 DIAGNOSIS — Z20822 Contact with and (suspected) exposure to covid-19: Secondary | ICD-10-CM | POA: Diagnosis present

## 2020-06-11 DIAGNOSIS — L0390 Cellulitis, unspecified: Secondary | ICD-10-CM | POA: Diagnosis present

## 2020-06-11 DIAGNOSIS — E1022 Type 1 diabetes mellitus with diabetic chronic kidney disease: Secondary | ICD-10-CM | POA: Diagnosis present

## 2020-06-11 DIAGNOSIS — E785 Hyperlipidemia, unspecified: Secondary | ICD-10-CM | POA: Diagnosis present

## 2020-06-11 DIAGNOSIS — E10621 Type 1 diabetes mellitus with foot ulcer: Secondary | ICD-10-CM | POA: Diagnosis present

## 2020-06-11 DIAGNOSIS — L97409 Non-pressure chronic ulcer of unspecified heel and midfoot with unspecified severity: Secondary | ICD-10-CM | POA: Diagnosis present

## 2020-06-11 DIAGNOSIS — L97512 Non-pressure chronic ulcer of other part of right foot with fat layer exposed: Secondary | ICD-10-CM | POA: Diagnosis not present

## 2020-06-11 DIAGNOSIS — Z79890 Hormone replacement therapy (postmenopausal): Secondary | ICD-10-CM | POA: Diagnosis not present

## 2020-06-11 DIAGNOSIS — E1043 Type 1 diabetes mellitus with diabetic autonomic (poly)neuropathy: Secondary | ICD-10-CM | POA: Diagnosis present

## 2020-06-11 DIAGNOSIS — M21371 Foot drop, right foot: Secondary | ICD-10-CM | POA: Diagnosis present

## 2020-06-11 DIAGNOSIS — E039 Hypothyroidism, unspecified: Secondary | ICD-10-CM | POA: Diagnosis present

## 2020-06-11 DIAGNOSIS — E10319 Type 1 diabetes mellitus with unspecified diabetic retinopathy without macular edema: Secondary | ICD-10-CM | POA: Diagnosis present

## 2020-06-11 DIAGNOSIS — E10628 Type 1 diabetes mellitus with other skin complications: Secondary | ICD-10-CM | POA: Diagnosis present

## 2020-06-11 DIAGNOSIS — K3184 Gastroparesis: Secondary | ICD-10-CM | POA: Diagnosis present

## 2020-06-11 DIAGNOSIS — K219 Gastro-esophageal reflux disease without esophagitis: Secondary | ICD-10-CM | POA: Diagnosis present

## 2020-06-11 DIAGNOSIS — Z833 Family history of diabetes mellitus: Secondary | ICD-10-CM | POA: Diagnosis not present

## 2020-06-11 DIAGNOSIS — M21372 Foot drop, left foot: Secondary | ICD-10-CM | POA: Diagnosis present

## 2020-06-11 DIAGNOSIS — I129 Hypertensive chronic kidney disease with stage 1 through stage 4 chronic kidney disease, or unspecified chronic kidney disease: Secondary | ICD-10-CM | POA: Diagnosis present

## 2020-06-11 LAB — COMPREHENSIVE METABOLIC PANEL
ALT: 14 U/L (ref 0–44)
AST: 19 U/L (ref 15–41)
Albumin: 3 g/dL — ABNORMAL LOW (ref 3.5–5.0)
Alkaline Phosphatase: 36 U/L — ABNORMAL LOW (ref 38–126)
Anion gap: 9 (ref 5–15)
BUN: 16 mg/dL (ref 6–20)
CO2: 30 mmol/L (ref 22–32)
Calcium: 8.9 mg/dL (ref 8.9–10.3)
Chloride: 101 mmol/L (ref 98–111)
Creatinine, Ser: 1.18 mg/dL — ABNORMAL HIGH (ref 0.44–1.00)
GFR calc Af Amer: 59 mL/min — ABNORMAL LOW (ref >60)
GFR calc non Af Amer: 51 mL/min — ABNORMAL LOW (ref >60)
Glucose, Bld: 138 mg/dL — ABNORMAL HIGH (ref 70–99)
Potassium: 4.5 mmol/L (ref 3.5–5.1)
Sodium: 140 mmol/L (ref 135–145)
Total Bilirubin: 0.1 mg/dL — ABNORMAL LOW (ref 0.3–1.2)
Total Protein: 6.6 g/dL (ref 6.5–8.1)

## 2020-06-11 LAB — CBC
HCT: 33.3 % — ABNORMAL LOW (ref 36.0–46.0)
Hemoglobin: 10.8 g/dL — ABNORMAL LOW (ref 12.0–15.0)
MCH: 32.4 pg (ref 26.0–34.0)
MCHC: 32.4 g/dL (ref 30.0–36.0)
MCV: 100 fL (ref 80.0–100.0)
Platelets: 321 10*3/uL (ref 150–400)
RBC: 3.33 MIL/uL — ABNORMAL LOW (ref 3.87–5.11)
RDW: 13 % (ref 11.5–15.5)
WBC: 10.6 10*3/uL — ABNORMAL HIGH (ref 4.0–10.5)
nRBC: 0 % (ref 0.0–0.2)

## 2020-06-11 LAB — GLUCOSE, CAPILLARY
Glucose-Capillary: 115 mg/dL — ABNORMAL HIGH (ref 70–99)
Glucose-Capillary: 117 mg/dL — ABNORMAL HIGH (ref 70–99)
Glucose-Capillary: 149 mg/dL — ABNORMAL HIGH (ref 70–99)
Glucose-Capillary: 174 mg/dL — ABNORMAL HIGH (ref 70–99)
Glucose-Capillary: 93 mg/dL (ref 70–99)

## 2020-06-11 MED ORDER — CEFAZOLIN SODIUM-DEXTROSE 1-4 GM/50ML-% IV SOLN
1.0000 g | Freq: Three times a day (TID) | INTRAVENOUS | Status: DC
  Administered 2020-06-11 – 2020-06-13 (×5): 1 g via INTRAVENOUS
  Filled 2020-06-11 (×5): qty 50

## 2020-06-11 MED ORDER — ENOXAPARIN SODIUM 40 MG/0.4ML ~~LOC~~ SOLN
40.0000 mg | SUBCUTANEOUS | Status: DC
  Administered 2020-06-11 – 2020-06-12 (×2): 40 mg via SUBCUTANEOUS
  Filled 2020-06-11 (×2): qty 0.4

## 2020-06-11 MED ORDER — JUVEN PO PACK
1.0000 | PACK | Freq: Two times a day (BID) | ORAL | Status: DC
  Administered 2020-06-11 – 2020-06-13 (×5): 1 via ORAL
  Filled 2020-06-11 (×5): qty 1

## 2020-06-11 NOTE — Consult Note (Signed)
PV Navigator Consult acknowledged and chart/media photos reviewed. I was able to touch base with patient via telephone to introduce myself, my role and answer any questions.  Per chart notes, patient had been following up with Podiatry for a wound to her right foot that was healing. She was wearing prescribed CAM boot when she noted blood on her sock and a new area of ulceration to the lateral aspect of her R foot. She immediately saw Podiatry who instructed her to go to the hospital for direct admission concerned for infection/abscess. Patient admitted through [facility] ED on 06/09/20. She is being followed by podiatry services.   Medical history to include DM-1, CKD- stage 3 and neuropathy.   MRI R foot 06/10/20 Negative for osteomyelitis. VAS [place] ABI scheduled for today.   Pending evaluations: OT (footwear), TOC, Nutrition and Diabetes Coordinator.  Patient denies further questions at this time or any barriers. I was able to communicate my contact information should she need to reach me for issues in the future.   Will follow.  Thank you, Hilma Favors RN BSN CWS PV Navigator [place]

## 2020-06-11 NOTE — Consult Note (Signed)
  Subjective:  Patient ID: [name], female    DOB: [birth year],  MRN: [id number]  A 59 y.o. female presents with past medical history of dropfoot in, neuropathies, gastroparesis, retinopathy with right right foot lateral frictional blister with underlying cellulitis.  There is a concern for deep abscess therefore I made the decision to go to the emergency room to be admitted for IV antibiotics and possible abscess.  Unfortunately patient [facility] the wait was too long and left out of frustration and came here to be admitted.  However I saw patient today at bedside she is in good spirit.  Patient states that she does not have any pain.  She states her foot is feeling little bit better.  She denies any nausea fever chills vomiting.  She denies any other acute complaints. Objective:   Vitals:   06/11/20 0154 06/11/20 0534  BP: 132/75 135/73  Pulse: 68 70  Resp: 16 18  Temp: 97.7 F (36.5 C) 98.2 F (36.8 C)  SpO2: 95% 95%   General AA&O x3. Normal mood and affect.  Vascular Dorsalis pedis and posterior tibial pulses faintly bilat. Brisk capillary refill to all digits. Pedal hair present.  Neurologic Epicritic sensation grossly intact.  Dermatologic  superficial skin ulceration status post debridement of the blister.  No underlying deep ulceration noted.  Cellulitis present up to the midfoot.  It is regressing.  No concern for deep abscess.  No crepitus felt.  No bone exposure.  Orthopedic: MMT 5/5 in dorsiflexion, plantarflexion, inversion, and eversion. Normal joint ROM without pain or crepitus.    Assessment & Plan:  Patient was evaluated and treated and all questions answered.  Right lateral foot frictional blister status post doing movement with underlying cellulitis -All questions and concerns were addressed at bedside. -MRI was reviewed which showed no drainable fluid collection and no concern for underlying osteomyelitis.  These findings were related to the  patient. -At this time no surgical interventions are indicated. -At this time patient will need about 72 hours of IV antibiotics followed by discharge on p.o. antibiotics. -Patient will need to be transition to weightbearing as tolerated in a surgical shoe/postop shoe.  I will hold off on putting her in a cam boot given that this resulted due to the cam boot.  I am also hesitant to put her in her previous brace given that that led to heel ulceration which has now resolved. -Awaiting ABIs PVRs to assess vascular flow to the right lower extremity.  However given that she has had history of previous ulceration which has healed in timely fashion patient should have enough vascular flow to the lower extremity on the right side to be able to heal the superficial ulceration. -Continue local wound care with Xeroform, Kerlix, 4 x 4 gauze, Ace bandage -I will see her back on Friday in the cellulitis has been regressed we will plan on discharging her on Friday.  Candelaria Stagers, DPM  Accessible via secure chat for questions or concerns.

## 2020-06-11 NOTE — Progress Notes (Signed)
Initial Nutrition Assessment  INTERVENTION:   -1 packet Juven BID, each packet provides 95 calories, 2.5 grams of protein (collagen), and 9.8 grams of carbohydrate (3 grams sugar); also contains 7 grams of L-arginine and L-glutamine, 300 mg vitamin C, 15 mg vitamin E, 1.2 mcg vitamin B-12, 9.5 mg zinc, 200 mg calcium, and 1.5 g  Calcium Beta-hydroxy-Beta-methylbutyrate to support wound healing    NUTRITION DIAGNOSIS:   Increased nutrient needs related to wound healing as evidenced by estimated needs.  GOAL:   Patient will meet greater than or equal to 90% of their needs  MONITOR:   PO intake, Supplement acceptance, Labs, Weight trends, I & O's, Skin  REASON FOR ASSESSMENT:   Consult Wound healing  ASSESSMENT:   59 yr old woman with DM I with multiple related comorbidities including drop foot, neuropathies, gastroparesis, and retinopathy. She had been wearing a CAM boot on the right extremity whilel the ulcer on the medial aspect of the heel was healing. The patient states that she has been treating the medial ulceration with betadine, and that it was getting better.  Patient in room with no visitors at bedside. Pt sleeping and did not waken to speak with RD.  Pt currently consuming 100% of meals, breakfast this morning provided ~210 kcals and 20g protein. Per chart review, pt with allergies to MSG and pt is lactose intolerant.  Will order Juven supplements to aid in wound healing from DM foot blister.  Per weight records, pt's weight has remained stable. Pt weighed 159 lbs in May 2019.  Medications: Creon, Multivitamin with minerals daily  Labs reviewed: CBGs: 93-115  NUTRITION - FOCUSED PHYSICAL EXAM:  No depletions noted.  Diet Order:   Diet Order            Diet heart healthy/carb modified Room service appropriate? Yes; Fluid consistency: Thin  Diet effective now                 EDUCATION NEEDS:   No education needs have been identified at this time  Skin:   Skin Assessment: Skin Integrity Issues: Skin Integrity Issues:: Diabetic Ulcer Diabetic Ulcer: right heel blister and ulceration  Last BM:  7/12  Height:   Ht Readings from Last 1 Encounters:  06/09/20 5\' 4"  (1.626 m)    Weight:   Wt Readings from Last 1 Encounters:  06/09/20 74 kg   BMI:  Body mass index is 28 kg/m.  Estimated Nutritional Needs:   Kcal:  1450-1650  Protein:  70-80g  Fluid:  1.6L/day  08/10/20, MS, RD, LDN Inpatient Clinical Dietitian Contact information available via Amion

## 2020-06-11 NOTE — Progress Notes (Signed)
PROGRESS NOTE    [name]  MPN:[id number] DOB: Mar 29, 1961 DOA: 06/09/2020 PCP: [name] Rainier, MD   Chief Complaint  Patient presents with   Wound Check    Brief Narrative:  The patient is [name] 59 yr old woman with DM I with multiple related comorbidities including drop foot, neuropathies, gastroparesis, and retinopathy. She had been wearing [name] CAM boot on the right extremity whilel the ulcer on the medial aspect of the heel was healing. The patient states that she has been treating the medial ulceration with betadine, and that it was getting better. Then this week she took off the boot to find [name] blood soaked sock and [name] new ulceration on the lateral aspect of the heel. She went to see her podiatrist, Dr. Allena [name] yesterday. He apparently unroofed the blister that was present on the lateral side and told her to go to [facility] to be admitted. Unfortunately, the wait at [facility] was too long and she left out of frustration and came here today as [name] direct admit instead. I have discussed the patient with Dr. Allena [name]. He will come to see the patient tomorrow.  The patient's past medical history is significant for DM I with retinopathy, CKD, neuropathy, drop foot, and gastroparesis. She uses DexCom4 and an insulin pump with humalog to control her diabetes.  The patient denies fevers, chills, nausea, vomiting, diarrhea, or pain. In fact she has no sensation to the right lower extremity. She also denies chest pain, cough, shortness of breath or other lesion or sore.  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)   Diabetic neuropathy (HCC)   Hypothyroidism   Depression   Diabetic retinopathy (HCC)   Gastroparesis due to DM (HCC)   Chronic renal insufficiency   Foot drop, bilateral   HLD (hyperlipidemia)   Obstructive sleep apnea   Cellulitis  Diabetic Foot Infection   Right Foot Cellulitis    Friction Blister:  Plain films with findings concerning for subacute/chronic osteo MRI without definite evidence osteomyelitis within visualized portions of lateral aspect of midfoot Superficial ulceration with subcutaneous edema beneath the area of interest Podiatry c/s, appreciate recommendations - 72 hrs abx, d/c on PO abx - transition to weight bearing as tolerated in surgical shoe/postop shoe.  Local wound care with xeroform, kerlix, 4x4, ace dressing.  Follow ABI's Ceftriaxone/flagyl  Follow blood cx CRP 6.3, sed rate 65  T1DM: continue home insulin pump  Hypothyroidism: continue synthroid  CKD IIIa: will continue to monitor  OSA: pt does not use CPAP  HLD: continue statin  DVT prophylaxis:lovenox Code Status: full  Family Communication: none at bedside Disposition:   Status is: Inpatient  Remains inpatient appropriate because:Inpatient level of care appropriate due to severity of illness   Dispo: The patient is from: Home              Anticipated d/c is to: Home              Anticipated d/c date is: > 3 days              Patient currently is not medically stable to d/c.   Consultants:   podiatry  Procedures:  none  Antimicrobials:  Anti-infectives (From admission, onward)   Start     Dose/Rate Route Frequency Ordered Stop   06/10/20 1600  cefTRIAXone (ROCEPHIN) 2 g in sodium chloride 0.9 % 100 mL IVPB  Discontinue    "And" Linked Group Details   2 g 200 mL/hr over 30 Minutes Intravenous Daily 06/10/20 1557     06/10/20 1600  metroNIDAZOLE (FLAGYL) IVPB 500 mg     Discontinue    "And" Linked Group Details   500 mg 100 mL/hr over 60 Minutes Intravenous Every 8 hours 06/10/20 1557       Subjective: No new complaints  Objective: Vitals:   06/10/20 2224 06/11/20 0154 06/11/20 0534 06/11/20 1322  BP: 128/75 132/75 135/73 133/65  Pulse: 72 68 70 73  Resp: 16 16 18 17   Temp: 98.3 F (36.8 C) 97.7 F (36.5 C) 98.2 F (36.8 C) 98.6 F (37 C)    TempSrc: Oral Oral Oral Oral  SpO2: 90% 95% 95% 97%  Weight:      Height:        Intake/Output Summary (Last 24 hours) at 06/11/2020 1548 Last data filed at 06/11/2020 1322 Gross per 24 hour  Intake 770 ml  Output 950 ml  Net -180 ml   Filed Weights   06/09/20 2129  Weight: 74 kg    Examination:  General exam: Appears calm and comfortable  Respiratory system: Clear to auscultation. Respiratory effort normal. Cardiovascular system: S1 & S2 heard, RRR. Gastrointestinal system: Abdomen is nondistended, soft and nontender.  Central nervous system: Alert and oriented. No focal neurological deficits. Extremities: dressing in place to RLE Psychiatry: Judgement and insight appear normal. Mood & affect appropriate.     Data Reviewed: I have personally reviewed following labs and imaging studies  CBC: Recent Labs  Lab 06/09/20 1532 06/10/20 1638 06/11/20 1110  WBC 10.6* 11.9* 10.6*  NEUTROABS 6.4 7.0  --   HGB 11.3* 11.7* 10.8*  HCT 35.6* 36.7 33.3*  MCV 100.3* 100.3* 100.0  PLT 313 360 321    Basic Metabolic Panel: Recent Labs  Lab 06/09/20 1532 06/10/20 1638 06/11/20 1110  NA 137 140 140  K 4.1 4.2 4.5  CL 98 100 101  CO2 27 28 30   GLUCOSE 151* 114* 138*  BUN 12 16 16   CREATININE 1.40* 1.25* 1.18*  CALCIUM 9.1 9.1 8.9    GFR: Estimated Creatinine Clearance: 51.2 mL/min ([name]) (by C-G formula based on SCr of 1.18 mg/dL (H)).  Liver Function Tests: Recent Labs  Lab 06/09/20 1532 06/11/20 1110  AST 23 19  ALT 17 14  ALKPHOS 40 36*  BILITOT 0.3 0.1*  PROT 6.7 6.6  ALBUMIN 3.2* 3.0*    CBG: Recent Labs  Lab 06/10/20 1310 06/10/20 2252 06/11/20 0156 06/11/20 0721 06/11/20 1151  GLUCAP 181* 204* 149* 93 115*     Recent Results (from the past 240 hour(s))  SARS Coronavirus 2 by RT PCR (hospital order, performed in [facility] hospital lab) Nasopharyngeal Nasopharyngeal Swab     Status: None   Collection Time: 06/10/20  2:18 AM   Specimen:  Nasopharyngeal Swab  Result Value Ref Range Status   SARS Coronavirus 2 NEGATIVE NEGATIVE Final    Comment: (NOTE) SARS-CoV-2 target nucleic acids are NOT DETECTED.  The SARS-CoV-2 RNA is generally detectable in upper and lower respiratory specimens during the acute phase of infection. The lowest concentration of SARS-CoV-2 viral copies this assay can detect is 250 copies / mL. Reia Viernes negative result does not preclude SARS-CoV-2 infection and should not be used as the sole basis for treatment or other patient management decisions.  Spence Soberano negative result may occur with improper specimen collection / handling, submission of specimen other than nasopharyngeal  swab, presence of viral mutation(s) within the areas targeted by this assay, and inadequate number of viral copies (<250 copies / mL). Lynsey Ange negative result must be combined with clinical observations, patient history, and epidemiological information.  Fact Sheet for Patients:   BoilerBrush.com.cy  Fact Sheet for Healthcare Providers: https://pope.com/  This test is not yet approved or  cleared by the [place] FDA and has been authorized for detection and/or diagnosis of SARS-CoV-2 by FDA under an Emergency Use Authorization (EUA).  This EUA will remain in effect (meaning this test can be used) for the duration of the COVID-19 declaration under Section 564(b)(1) of the Act, 21 U.S.C. section 360bbb-3(b)(1), unless the authorization is terminated or revoked sooner.  Performed at [facility], [address] [place], [place] [address]   Blood Cultures x 2 sites     Status: None (Preliminary result)   Collection Time: 06/10/20  4:38 PM   Specimen: BLOOD  Result Value Ref Range Status   Specimen Description   Final    BLOOD RIGHT ARM Performed at [facility], [address], [place], [place] [address]    Special Requests   Final    BOTTLES DRAWN AEROBIC  AND ANAEROBIC Blood Culture adequate volume Performed at [facility], [address], [place], [place] [address]    Culture   Final    NO GROWTH < 12 HOURS Performed at [facility], [address], [place], [place] [address]    Report Status PENDING  Incomplete  Blood Cultures x 2 sites     Status: None (Preliminary result)   Collection Time: 06/10/20  4:40 PM   Specimen: BLOOD  Result Value Ref Range Status   Specimen Description   Final    BLOOD LEFT ANTECUBITAL Performed at [facility], [address] [place], [place] [address]    Special Requests   Final    BOTTLES DRAWN AEROBIC AND ANAEROBIC Blood Culture adequate volume Performed at [facility], [address] [place], [place] [address]    Culture   Final    NO GROWTH < 12 HOURS Performed at [facility], [address], [place], [place] [address]    Report Status PENDING  Incomplete         Radiology Studies: MR FOOT RIGHT WO CONTRAST  Result Date: 06/10/2020 CLINICAL DATA:  Ulcer on the lateral aspect of the foot. EXAM: MRI OF THE RIGHT FOREFOOT WITHOUT CONTRAST TECHNIQUE: Multiplanar, multisequence MR imaging of the right was performed. No intravenous contrast was administered. COMPARISON:  None. FINDINGS: Bones/Joint/Cartilage Beneath the area of interest denoted by the skin marker there is normal osseous marrow signal seen throughout within the midfoot and the proximal metatarsal bases. No osseous fracture, cortical destruction or periosteal reaction is seen. The articular surfaces appear to be maintained. No large joint effusions. Ligaments Suboptimally visualized Muscles and Tendons There is mild fatty atrophy with increased diffuse signal noted within the muscles surrounding the hindfoot. The visualized portion of the tendons are intact. The plantar fascia appears to be intact. Soft tissues Mild dorsal subcutaneous edema is seen. There is also  subcutaneous edema with Diana Davenport superficial ulceration over the lateral aspect of the midfoot. No loculated fluid collections or sinus tract is seen. IMPRESSION: No definite evidence osteomyelitis within the visualized portions of the lateral aspect of midfoot. Superficial ulceration with subcutaneous edema beneath the area of interest. No sinus tract or loculated fluid  collection. Electronically Signed   By: Jonna ClarkBindu  Avutu M.D.   On: 06/10/2020 22:00   DG Foot Complete Right  Result Date: 06/09/2020 CLINICAL DATA:  Possible infection, ulcer on the lateral corner of the foot. EXAM: RIGHT FOOT COMPLETE - 3+ VIEW COMPARISON:  May 02, 2020 FINDINGS: No fracture seen. Again noted is cortical erosive type changes seen at the fifth metatarsal head with Oniya Mandarino possible healed fracture deformity, as on the prior exam. Overlying area of ulceration and soft tissue swelling is seen. No other areas of cortical destruction are seen. There is mild periosteal reaction seen in the fourth metatarsal shaft. Dorsal subcutaneous edema is noted. 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 Overlying area of ulceration and soft tissue swelling is seen. Electronically Signed   By: Jonna ClarkBindu  Avutu M.D.   On: 06/09/2020 15:59        Scheduled Meds:  aspirin  81 mg Oral Daily   cycloSPORINE  1 drop Both Eyes BID   enoxaparin (LOVENOX) injection  40 mg Subcutaneous Q24H   escitalopram  10 mg Oral BID   famotidine  10 mg Oral BID   fenofibrate  160 mg Oral Daily   fluticasone  2 spray Each Nare Daily   gabapentin  300 mg Oral TID   insulin pump  1 each Subcutaneous TID WC, HS, 0200   lamoTRIgine  150 mg Oral BID   levothyroxine  112 mcg Oral Daily   lipase/protease/amylase  36,000 Units Oral TID WC   loratadine  10 mg Oral Daily   montelukast  10 mg Oral QHS   multivitamin with minerals  1 tablet Oral Daily    nutrition supplement (JUVEN)  1 packet Oral BID BM   primidone  50 mg Oral q morning - 10a   rosuvastatin  20 mg Oral Daily   Continuous Infusions:  cefTRIAXone (ROCEPHIN)  IV 2 g (06/11/20 1019)   And   metronidazole 500 mg (06/11/20 1513)     LOS: 0 days    Time spent: over 30 min    Lacretia Nicksaldwell Powell, MD [facility]   To contact the attending provider between 7A-7P or the covering provider during after hours 7P-7A, please log into the web site [email or web address] and access using universal [place] password for that web site. If you do not have the password, please call the hospital operator.  06/11/2020, 3:48 PM

## 2020-06-11 NOTE — Progress Notes (Addendum)
Inpatient Diabetes Program Recommendations  AACE/ADA: New Consensus Statement on Inpatient Glycemic Control (2015)  Target Ranges:  Prepandial:   less than 140 mg/dL      Peak postprandial:   less than 180 mg/dL (1-2 hours)      Critically ill patients:  140 - 180 mg/dL   Lab Results  Component Value Date   GLUCAP 93 06/11/2020   HGBA1C 5.0 06/10/2020    Review of Glycemic Control Results for [name], [name]" (MRN [id number]) as of 06/11/2020 11:06  Ref. Range 06/10/2020 07:16 06/10/2020 13:10 06/10/2020 22:52 06/11/2020 01:56 06/11/2020 07:21  Glucose-Capillary Latest Ref Range: 70 - 99 mg/dL 387 (H) 564 (H) 332 (H) 149 (H) 93   Diabetes history: DM1 Outpatient Diabetes medications: Insulin Pump Current orders for Inpatient glycemic control: Insulin Pump  Inpatient Diabetes Program Recommendations:   Novolog correction scale discontinued per orders Dr. Lowell Guitar. Will plan to speak with patient regarding diabetes management. Spoke with patient. Patient sees Dr. Sharl Ma as endocrinologist and current settings include: Basal rate 0.9 units/hr Total basal = 19.44 units/24 hrs.  1 unit drops CBG 40 Carbohydrate ratio is 1 units;4 gms carbs Target range = 110  Thank you, Monserrat Vidaurri E. Keymarion Bearman, RN, MSN, CDE  Diabetes Coordinator Inpatient Glycemic Control Team Team Pager [phone] (8am-5pm) 06/11/2020 11:06 AM

## 2020-06-12 ENCOUNTER — Telehealth: Payer: Self-pay | Admitting: Podiatry

## 2020-06-12 LAB — GLUCOSE, CAPILLARY
Glucose-Capillary: 118 mg/dL — ABNORMAL HIGH (ref 70–99)
Glucose-Capillary: 196 mg/dL — ABNORMAL HIGH (ref 70–99)
Glucose-Capillary: 174 mg/dL — ABNORMAL HIGH (ref 70–99)
Glucose-Capillary: 189 mg/dL — ABNORMAL HIGH (ref 70–99)

## 2020-06-12 LAB — CBC WITH DIFFERENTIAL/PLATELET
Abs Immature Granulocytes: 0.03 10*3/uL (ref 0.00–0.07)
Basophils Absolute: 0.1 10*3/uL (ref 0.0–0.1)
Basophils Relative: 1 %
Eosinophils Absolute: 0.4 10*3/uL (ref 0.0–0.5)
Eosinophils Relative: 5 %
HCT: 36.2 % (ref 36.0–46.0)
Hemoglobin: 11.3 g/dL — ABNORMAL LOW (ref 12.0–15.0)
Immature Granulocytes: 0 %
Lymphocytes Relative: 28 %
Lymphs Abs: 2.5 10*3/uL (ref 0.7–4.0)
MCH: 31.3 pg (ref 26.0–34.0)
MCHC: 31.2 g/dL (ref 30.0–36.0)
MCV: 100.3 fL — ABNORMAL HIGH (ref 80.0–100.0)
Monocytes Absolute: 0.6 10*3/uL (ref 0.1–1.0)
Monocytes Relative: 7 %
Neutro Abs: 5.4 10*3/uL (ref 1.7–7.7)
Neutrophils Relative %: 59 %
Platelets: 346 10*3/uL (ref 150–400)
RBC: 3.61 MIL/uL — ABNORMAL LOW (ref 3.87–5.11)
RDW: 13.1 % (ref 11.5–15.5)
WBC: 9.1 10*3/uL (ref 4.0–10.5)
nRBC: 0 % (ref 0.0–0.2)

## 2020-06-12 LAB — COMPREHENSIVE METABOLIC PANEL
ALT: 14 U/L (ref 0–44)
AST: 17 U/L (ref 15–41)
Albumin: 3.2 g/dL — ABNORMAL LOW (ref 3.5–5.0)
Alkaline Phosphatase: 35 U/L — ABNORMAL LOW (ref 38–126)
Anion gap: 9 (ref 5–15)
BUN: 17 mg/dL (ref 6–20)
CO2: 29 mmol/L (ref 22–32)
Calcium: 8.9 mg/dL (ref 8.9–10.3)
Chloride: 100 mmol/L (ref 98–111)
Creatinine, Ser: 1.19 mg/dL — ABNORMAL HIGH (ref 0.44–1.00)
GFR calc Af Amer: 58 mL/min — ABNORMAL LOW (ref >60)
GFR calc non Af Amer: 50 mL/min — ABNORMAL LOW (ref >60)
Glucose, Bld: 102 mg/dL — ABNORMAL HIGH (ref 70–99)
Potassium: 4.2 mmol/L (ref 3.5–5.1)
Sodium: 138 mmol/L (ref 135–145)
Total Bilirubin: 0.4 mg/dL (ref 0.3–1.2)
Total Protein: 6.6 g/dL (ref 6.5–8.1)

## 2020-06-12 LAB — PHOSPHORUS: Phosphorus: 2.7 mg/dL (ref 2.5–4.6)

## 2020-06-12 LAB — MAGNESIUM: Magnesium: 2.3 mg/dL (ref 1.7–2.4)

## 2020-06-12 NOTE — Progress Notes (Signed)
PROGRESS NOTE    [name]  BOF:[id number] DOB: 03-15-1961 DOA: 06/09/2020 PCP: [name] Rainier, MD   Chief Complaint  Patient presents with  . Wound Check    Brief Narrative:  The patient is [name] 59 yr old woman with DM I with multiple related comorbidities including drop foot, neuropathies, gastroparesis, and retinopathy. She had been wearing [employer] CAM boot on the right extremity whilel the ulcer on the medial aspect of the heel was healing. The patient states that she has been treating the medial ulceration with betadine, and that it was getting better. Then this week she took off the boot to find [name] blood soaked sock and [name] new ulceration on the lateral aspect of the heel. She went to see her podiatrist, Dr. Allena [name] yesterday. He apparently unroofed the blister that was present on the lateral side and told her to go to [name] to be admitted. Unfortunately, the wait at [name] was too long and she left out of frustration and came here today as [name] direct admit instead. I have discussed the patient with Dr. Allena [name]. He will come to see the patient tomorrow.  The patient's past medical history is significant for DM I with retinopathy, CKD, neuropathy, drop foot, and gastroparesis. She uses DexCom4 and an insulin pump with humalog to control her diabetes.  The patient denies fevers, chills, nausea, vomiting, diarrhea, or pain. In fact she has no sensation to the right lower extremity. She also denies chest pain, cough, shortness of breath or other lesion or sore.  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)   Diabetic neuropathy (HCC)   Hypothyroidism   Depression   Diabetic retinopathy (HCC)   Gastroparesis due to DM (HCC)   Chronic renal insufficiency   Foot drop, bilateral   HLD (hyperlipidemia)   Obstructive sleep apnea   Cellulitis  Diabetic Foot Infection  Right Foot Cellulitis   Friction Blister:  Plain films with findings concerning for subacute/chronic osteo MRI without definite evidence osteomyelitis within visualized portions of lateral aspect of midfoot Superficial ulceration with subcutaneous edema beneath the area of interest Podiatry c/s, appreciate recommendations - 72 hrs abx, d/c on PO abx - transition to weight bearing as tolerated in surgical shoe/postop shoe.  Local wound care with xeroform, kerlix, 4x4, ace dressing.  Follow ABI's (pending) Ceftriaxone/flagyl -> ancef Follow blood cx CRP 6.3, sed rate 65  T1DM: continue home insulin pump - bgs reasonable   Hypothyroidism: continue synthroid  CKD IIIa: will continue to monitor  OSA: pt does not use CPAP  HLD: continue statin  DVT prophylaxis:lovenox Code Status: full  Family Communication: none at bedside Disposition:   Status is: Inpatient  Remains inpatient appropriate because:Inpatient level of care appropriate due to severity of illness   Dispo: The patient is from: Home              Anticipated d/c is to: Home              Anticipated d/c date is: > 3 days              Patient currently is not medically stable to d/c.   Consultants:   podiatry  Procedures:  none  Antimicrobials:  Anti-infectives (From admission, onward)   Start     Dose/Rate Route Frequency Ordered Stop   06/11/20 2200  ceFAZolin (ANCEF) IVPB 1 g/50 mL premix  Discontinue     1 g 100 mL/hr over 30 Minutes Intravenous Every 8 hours 06/11/20 2012     06/10/20 1600  cefTRIAXone (ROCEPHIN) 2 g in sodium chloride 0.9 % 100 mL IVPB  Status:  Discontinued       "And" Linked Group Details   2 g 200 mL/hr over 30 Minutes Intravenous Daily 06/10/20 1557 06/11/20 2012   06/10/20 1600  metroNIDAZOLE (FLAGYL) IVPB 500 mg  Status:  Discontinued       "And" Linked Group Details   500 mg 100 mL/hr over 60 Minutes Intravenous Every 8 hours 06/10/20 1557 06/11/20 2012     Subjective: Asking about post op  shoe  Objective: Vitals:   06/11/20 1322 06/11/20 2009 06/12/20 0544 06/12/20 1342  BP: 133/65 113/67 122/70 102/60  Pulse: 73 61  67  Resp: 17 16 18    Temp: 98.6 F (37 C) 97.7 F (36.5 C) 98.3 F (36.8 C) 98 F (36.7 C)  TempSrc: Oral Oral Oral   SpO2: 97% 93% 98% 91%  Weight:      Height:        Intake/Output Summary (Last 24 hours) at 06/12/2020 1533 Last data filed at 06/12/2020 1402 Gross per 24 hour  Intake 1140 ml  Output 1650 ml  Net -510 ml   Filed Weights   06/09/20 2129  Weight: 74 kg    Examination:  General: No acute distress. Cardiovascular: Heart sounds show [name] regular rate, and rhythm Lungs: Clear to auscultation bilaterally Abdomen: Soft, nontender, nondistended  Neurological: Alert and oriented 3. Moves all extremities 4. Cranial nerves II through XII grossly intact. Skin: Warm and dry. No rashes or lesions. Extremities: rle with intact dressing   Data Reviewed: I have personally reviewed following labs and imaging studies  CBC: Recent Labs  Lab 06/09/20 1532 06/10/20 1638 06/11/20 1110 06/12/20 0453  WBC 10.6* 11.9* 10.6* 9.1  NEUTROABS 6.4 7.0  --  5.4  HGB 11.3* 11.7* 10.8* 11.3*  HCT 35.6* 36.7 33.3* 36.2  MCV 100.3* 100.3* 100.0 100.3*  PLT 313 360 321 346    Basic Metabolic Panel: Recent Labs  Lab 06/09/20 1532 06/10/20 1638 06/11/20 1110 06/12/20 0453  NA 137 140 140 138  K 4.1 4.2 4.5 4.2  CL 98 100 101 100  CO2 27 28 30 29   GLUCOSE 151* 114* 138* 102*  BUN 12 16 16 17   CREATININE 1.40* 1.25* 1.18* 1.19*  CALCIUM 9.1 9.1 8.9 8.9  MG  --   --   --  2.3  PHOS  --   --   --  2.7    GFR: Estimated Creatinine Clearance: 50.8 mL/min ([name]) (by C-G formula based on SCr of 1.19 mg/dL (H)).  Liver Function Tests: Recent Labs  Lab 06/09/20 1532 06/11/20 1110 06/12/20 0453  AST 23 19 17   ALT 17 14 14   ALKPHOS 40 36* 35*  BILITOT 0.3 0.1* 0.4  PROT 6.7 6.6 6.6  ALBUMIN 3.2* 3.0* 3.2*    CBG: Recent Labs  Lab  06/11/20 1151 06/11/20 1727 06/11/20 2354 06/12/20 0735 06/12/20 1116  GLUCAP 115* 174* 117* 118* 196*     Recent Results (from the past 240 hour(s))  SARS Coronavirus 2 by RT PCR ([name] order, performed in [name] lab) Nasopharyngeal Nasopharyngeal Swab     Status: None   Collection Time: 06/10/20  2:18 AM   Specimen: Nasopharyngeal Swab  Result Value Ref Range Status   SARS Coronavirus 2 NEGATIVE NEGATIVE Final  Comment: (NOTE) SARS-CoV-2 target nucleic acids are NOT DETECTED.  The SARS-CoV-2 RNA is generally detectable in upper and lower respiratory specimens during the acute phase of infection. The lowest concentration of SARS-CoV-2 viral copies this assay can detect is 250 copies / mL. Rivan Siordia negative result does not preclude SARS-CoV-2 infection and should not be used as the sole basis for treatment or other patient management decisions.  Montravious Weigelt negative result may occur with improper specimen collection / handling, submission of specimen other than nasopharyngeal swab, presence of viral mutation(s) within the areas targeted by this assay, and inadequate number of viral copies (<250 copies / mL). Muna Demers negative result must be combined with clinical observations, patient history, and epidemiological information.  Fact Sheet for Patients:   BoilerBrush.com.cyhttps://www.fda.gov/media/136312/download  Fact Sheet for Healthcare Providers: https://pope.com/https://www.fda.gov/media/136313/download  This test is not yet approved or  cleared by the [place] FDA and has been authorized for detection and/or diagnosis of SARS-CoV-2 by FDA under an Emergency Use Authorization (EUA).  This EUA will remain in effect (meaning this test can be used) for the duration of the COVID-19 declaration under Section 564(b)(1) of the Act, 21 U.S.C. section 360bbb-3(b)(1), unless the authorization is terminated or revoked sooner.  Performed at [name], [name] [place], [place] [address]    Blood Cultures x 2 sites     Status: None (Preliminary result)   Collection Time: 06/10/20  4:38 PM   Specimen: BLOOD  Result Value Ref Range Status   Specimen Description   Final    BLOOD RIGHT ARM Performed at [name], [name] [place], [place] [address]    Special Requests   Final    BOTTLES DRAWN AEROBIC AND ANAEROBIC Blood Culture adequate volume Performed at [name], [name] [place], [place] [address]    Culture   Final    NO GROWTH 2 DAYS Performed at [facility], [name] [place], [place] [address]    Report Status PENDING  Incomplete  Blood Cultures x 2 sites     Status: None (Preliminary result)   Collection Time: 06/10/20  4:40 PM   Specimen: BLOOD  Result Value Ref Range Status   Specimen Description   Final    BLOOD LEFT ANTECUBITAL Performed at [name], [name] [place], [place] [address]    Special Requests   Final    BOTTLES DRAWN AEROBIC AND ANAEROBIC Blood Culture adequate volume Performed at [name], [name] [place], [place] [address]    Culture   Final    NO GROWTH 2 DAYS Performed at [facility], [name] [place], [place] [address]    Report Status PENDING  Incomplete         Radiology Studies: MR FOOT RIGHT WO CONTRAST  Result Date: 06/10/2020 CLINICAL DATA:  Ulcer on the lateral aspect of the foot. EXAM: MRI OF THE RIGHT FOREFOOT WITHOUT CONTRAST TECHNIQUE: Multiplanar, multisequence MR imaging of the right was performed. No intravenous contrast was administered. COMPARISON:  None. FINDINGS: Bones/Joint/Cartilage Beneath the area of interest denoted by the skin marker there is normal osseous marrow signal seen throughout within the midfoot and the proximal metatarsal bases. No osseous fracture, cortical destruction or periosteal reaction is seen. The articular surfaces appear to be maintained. No  large joint effusions. Ligaments Suboptimally visualized Muscles and Tendons There is mild fatty atrophy with increased diffuse signal noted within  the muscles surrounding the hindfoot. The visualized portion of the tendons are intact. The plantar fascia appears to be intact. Soft tissues Mild dorsal subcutaneous edema is seen. There is also subcutaneous edema with Chane Magner superficial ulceration over the lateral aspect of the midfoot. No loculated fluid collections or sinus tract is seen. IMPRESSION: No definite evidence osteomyelitis within the visualized portions of the lateral aspect of midfoot. Superficial ulceration with subcutaneous edema beneath the area of interest. No sinus tract or loculated fluid collection. Electronically Signed   By: Jonna Clark M.D.   On: 06/10/2020 22:00        Scheduled Meds: . aspirin  81 mg Oral Daily  . cycloSPORINE  1 drop Both Eyes BID  . enoxaparin (LOVENOX) injection  40 mg Subcutaneous Q24H  . escitalopram  10 mg Oral BID  . famotidine  10 mg Oral BID  . fenofibrate  160 mg Oral Daily  . fluticasone  2 spray Each Nare Daily  . gabapentin  300 mg Oral TID  . insulin pump  1 each Subcutaneous TID WC, HS, 0200  . lamoTRIgine  150 mg Oral BID  . levothyroxine  112 mcg Oral Daily  . lipase/protease/amylase  36,000 Units Oral TID WC  . loratadine  10 mg Oral Daily  . montelukast  10 mg Oral QHS  . multivitamin with minerals  1 tablet Oral Daily  . nutrition supplement (JUVEN)  1 packet Oral BID BM  . primidone  50 mg Oral q morning - 10a  . rosuvastatin  20 mg Oral Daily   Continuous Infusions: .  ceFAZolin (ANCEF) IV 1 g (06/12/20 1523)     LOS: 1 day    Time spent: over 30 min    Lacretia Nicks, MD [facility]   To contact the attending provider between 7A-7P or the covering provider during after hours 7P-7A, please log into the web site [email or web address] and access using universal [place] password for that web site. If you do not have  the password, please call the [name] operator.  06/12/2020, 3:33 PM

## 2020-06-12 NOTE — Telephone Encounter (Signed)
error 

## 2020-06-13 ENCOUNTER — Ambulatory Visit: Payer: 59 | Admitting: Podiatry

## 2020-06-13 ENCOUNTER — Telehealth: Payer: Self-pay | Admitting: Podiatry

## 2020-06-13 ENCOUNTER — Inpatient Hospital Stay (HOSPITAL_COMMUNITY): Payer: Managed Care, Other (non HMO)

## 2020-06-13 DIAGNOSIS — E108 Type 1 diabetes mellitus with unspecified complications: Secondary | ICD-10-CM

## 2020-06-13 DIAGNOSIS — L089 Local infection of the skin and subcutaneous tissue, unspecified: Secondary | ICD-10-CM

## 2020-06-13 DIAGNOSIS — L97512 Non-pressure chronic ulcer of other part of right foot with fat layer exposed: Secondary | ICD-10-CM

## 2020-06-13 DIAGNOSIS — E11628 Type 2 diabetes mellitus with other skin complications: Secondary | ICD-10-CM

## 2020-06-13 LAB — CBC WITH DIFFERENTIAL/PLATELET
Abs Immature Granulocytes: 0.08 10*3/uL — ABNORMAL HIGH (ref 0.00–0.07)
Basophils Absolute: 0.1 10*3/uL (ref 0.0–0.1)
Basophils Relative: 1 %
Eosinophils Absolute: 0.4 10*3/uL (ref 0.0–0.5)
Eosinophils Relative: 4 %
HCT: 37.3 % (ref 36.0–46.0)
Hemoglobin: 11.8 g/dL — ABNORMAL LOW (ref 12.0–15.0)
Immature Granulocytes: 1 %
Lymphocytes Relative: 25 %
Lymphs Abs: 2.6 10*3/uL (ref 0.7–4.0)
MCH: 31.7 pg (ref 26.0–34.0)
MCHC: 31.6 g/dL (ref 30.0–36.0)
MCV: 100.3 fL — ABNORMAL HIGH (ref 80.0–100.0)
Monocytes Absolute: 0.7 10*3/uL (ref 0.1–1.0)
Monocytes Relative: 7 %
Neutro Abs: 6.6 10*3/uL (ref 1.7–7.7)
Neutrophils Relative %: 62 %
Platelets: 355 10*3/uL (ref 150–400)
RBC: 3.72 MIL/uL — ABNORMAL LOW (ref 3.87–5.11)
RDW: 13.1 % (ref 11.5–15.5)
WBC: 10.4 10*3/uL (ref 4.0–10.5)
nRBC: 0 % (ref 0.0–0.2)

## 2020-06-13 LAB — COMPREHENSIVE METABOLIC PANEL
ALT: 11 U/L (ref 0–44)
AST: 17 U/L (ref 15–41)
Albumin: 3.4 g/dL — ABNORMAL LOW (ref 3.5–5.0)
Alkaline Phosphatase: 38 U/L (ref 38–126)
Anion gap: 9 (ref 5–15)
BUN: 20 mg/dL (ref 6–20)
CO2: 28 mmol/L (ref 22–32)
Calcium: 9.6 mg/dL (ref 8.9–10.3)
Chloride: 100 mmol/L (ref 98–111)
Creatinine, Ser: 1.26 mg/dL — ABNORMAL HIGH (ref 0.44–1.00)
GFR calc Af Amer: 54 mL/min — ABNORMAL LOW (ref >60)
GFR calc non Af Amer: 47 mL/min — ABNORMAL LOW (ref >60)
Glucose, Bld: 153 mg/dL — ABNORMAL HIGH (ref 70–99)
Potassium: 4.4 mmol/L (ref 3.5–5.1)
Sodium: 137 mmol/L (ref 135–145)
Total Bilirubin: 0.5 mg/dL (ref 0.3–1.2)
Total Protein: 7.3 g/dL (ref 6.5–8.1)

## 2020-06-13 LAB — GLUCOSE, CAPILLARY
Glucose-Capillary: 130 mg/dL — ABNORMAL HIGH (ref 70–99)
Glucose-Capillary: 223 mg/dL — ABNORMAL HIGH (ref 70–99)
Glucose-Capillary: 219 mg/dL — ABNORMAL HIGH (ref 70–99)

## 2020-06-13 LAB — PHOSPHORUS: Phosphorus: 3.3 mg/dL (ref 2.5–4.6)

## 2020-06-13 LAB — MAGNESIUM: Magnesium: 2.4 mg/dL (ref 1.7–2.4)

## 2020-06-13 MED ORDER — DOXYCYCLINE MONOHYDRATE 100 MG PO TABS: 100.0000 mg | ORAL_TABLET | Freq: Two times a day (BID) | ORAL | 0 refills | Status: AC

## 2020-06-13 MED ORDER — ACETAMINOPHEN 325 MG PO TABS
650.0000 mg | ORAL_TABLET | Freq: Four times a day (QID) | ORAL | Status: DC | PRN
  Administered 2020-06-13: 650 mg via ORAL
  Filled 2020-06-13: qty 2

## 2020-06-13 NOTE — Telephone Encounter (Signed)
Pt husband called stating that pt could not be released from hospital due to the braces not fitting well wanted to know if pt could just wear original braces just so the pt can  Go home

## 2020-06-13 NOTE — Progress Notes (Signed)
Orthopedic Tech Progress Note Patient Details:  [name] 1961-11-09 [id number]  Ortho Devices Type of Ortho Device: Postop shoe/boot Ortho Device/Splint Location: right Ortho Device/Splint Interventions: Application   Post Interventions Patient Tolerated: Well Instructions Provided: Care of device   Saul Fordyce 06/13/2020, 9:45 AM

## 2020-06-13 NOTE — TOC Transition Note (Signed)
Transition of Care [facility]) - CM/SW Discharge Note   Patient Details  Name: [name] MRN: [id number] Date of Birth: Jun 22, 1961  [facility]) CM/SW Contact:  Darleene Cleaver, LCSW Phone Number: 06/13/2020, 5:13 PM   Clinical Narrative:     CSW spoke to [place] at [employer] regarding trying to set up home health for patient.  Per Porfirio Mylar she has tried 10 home health agencies, and the only agency that is reviewing patient is [facility].  Per Porfirio Mylar [facility] is supposed to let her know about accepting the patient or not.  Per Porfirio Mylar, if [facility] does not accept patient, she will escalate it to the supervisor over weekend to work on trying to find patient home health which will be out of network.    CSW asked bedside nurse to teach husband how to do the dressing changes in case patient is not able to receive home health services.  CSW provided [employer] with weekend CSW phone number so Rosann Auerbach can contact CSW and try to find services.  Per bedside nurse, patient said her husband is able to be taught how to do the dressing change.  Bedside nurse to contact patient's husband to teach him how to do the dressing change.  CSW will follow up with patient and husband if home health agency ends up accepting patient.  Patient will be discharging home, CSW will continue to work with insurance company to try to find a home health agency that can accept her.   Final next level of care: Home w Home Health Services Barriers to Discharge: No Home Care Agency will accept this patient   Patient Goals and CMS Choice Patient states their goals for this hospitalization and ongoing recovery are:: To return back home CMS Medicare.gov Compare Post Acute Care list provided to:: Patient Choice offered to / list presented to : Patient  Discharge Placement                       Discharge Plan and Services                DME Arranged: N/A                    Social Determinants of Health  (SDOH) Interventions     Readmission Risk Interventions No flowsheet data found.

## 2020-06-13 NOTE — Progress Notes (Signed)
ABI has been completed.   Preliminary results in CV Proc.   Blanch Media 06/13/2020 1:48 PM

## 2020-06-13 NOTE — Discharge Summary (Signed)
Physician Discharge Summary  [name] CWU:[id number] DOB: 11/05/1961 DOA: 06/09/2020  PCP: [name], MD  Admit date: 06/09/2020 Discharge date: 06/13/2020  Time spent: 40 minutes  Recommendations for Outpatient Follow-up:  1. Follow outpatient CBC/CMP 2. Follow with podiatry outpatient for right foot cellulitis 3. Follow repeat ESR/CRP outpatient with podiatry closer to end of course for cellulitis 4. Consider additional imaging if slow to improve 5. Continue dressing changes per podiatry and follow up with podiatry for ankle brace  Discharge Diagnoses:  Principal Problem:   Diabetic foot infection ([place]) Active Problems:   Diabetes mellitus type 1 with complications ([place])   Diabetic neuropathy ([place])   Hypothyroidism   Depression   Diabetic retinopathy ([place])   Gastroparesis due to DM ([place])   Chronic renal insufficiency   Foot drop, bilateral   HLD (hyperlipidemia)   Obstructive sleep apnea   Cellulitis   Discharge Condition: stable  Diet recommendation: diabetic  Filed Weights   06/09/20 2129  Weight: 74 kg    History of present illness:  The patient is [name] 59 yr old woman with DM I with multiple related comorbidities including drop foot, neuropathies, gastroparesis, and retinopathy. She had been wearing [name] CAM boot on the right extremity whilel the ulcer on the medial aspect of the heel was healing. The patient states that she has been treating the medial ulceration with betadine, and that it was getting better. Then this week she took off the boot to find [name] blood soaked sock and [name] new ulceration on the lateral aspect of the heel. She went to see her podiatrist, Dr. Posey Pronto yesterday. He apparently unroofed the blister that was present on the lateral side and told her to go to [facility] to be admitted. Unfortunately, the wait at [facility] was too long and she left out of frustration and came here today as [name] direct admit instead. I have discussed the patient with Dr. Posey Pronto.  He will come to see the patient tomorrow.  The patient's past medical history is significant for DM I with retinopathy, CKD, neuropathy, drop foot, and gastroparesis. She uses DexCom4 and an insulin pump with humalog to control her diabetes.  The patient denies fevers, chills, nausea, vomiting, diarrhea, or pain. In fact she has no sensation to the right lower extremity. She also denies chest pain, cough, shortness of breath or other lesion or sore  She was admitted for [name] right diabetic foot infection.  She had MRI of right foot which did not show evidence of osteomyelitis.  Podiatry recommended 72 hrs IV abx and discharge with doxycycline with dressing changes.  Will follow up with podiatry outpatient for continued management.  See below for additional details  Hospital Course:  Diabetic Foot Infection  Right Foot Cellulitis  Friction Blister:  Plain films with findings concerning for subacute/chronic osteo MRI without definite evidence osteomyelitis within visualized portions of lateral aspect of midfoot Superficial ulceration with subcutaneous edema beneath the area of interest Podiatry c/s, appreciate recommendations - 72 hrs abx, d/c on PO abx - transition to weight bearing as tolerated in surgical shoe/postop shoe.  Local wound care with xeroform, kerlix, 4x4, ace dressing.  Follow ABI's (wnl) Ceftriaxone/flagyl -> ancef - discharge with doxycycline per Dr. Posey Pronto Follow blood cx CRP 6.3, sed rate 65  Follow repeat inflammatory markers towards end of course of abx with podiatry Consider repeat imaging if foot does not continue to improve Unable to ambulate without ankle support with postop shoe, she'll use her brace  to go home and follow up with Dr. Posey Pronto to discuss alternative braces when she sees him Monday (discussed with Dr. Posey Pronto)  T1DM: continue home insulin pump  Hypothyroidism: continue synthroid  CKD IIIa: will continue to monitor  OSA: pt does not use CPAP  HLD:  continue statin  Procedures: ABI Summary:  Right: Resting right ankle-brachial index is within normal range. No  evidence of significant right lower extremity arterial disease.   Left: Resting left ankle-brachial index is within normal range. No  evidence of significant left lower extremity arterial disease.   Consultations:  podiatry  Discharge Exam: Vitals:   06/13/20 1257 06/13/20 1455  BP: 97/61   Pulse: 69   Resp: 16   Temp: 98.2 F (36.8 C)   SpO2: 93% 95%   Eager for discharge home Feeling better Post op shoe isn't working for her  General: No acute distress. Cardiovascular: Heart sounds show [name] regular rate, and rhythm. Lungs: Clear to auscultation bilaterally Abdomen: Soft, nontender, nondistended  Neurological: Alert and oriented 3. Moves all extremities 4 . Cranial nerves II through XII grossly intact. Skin: Warm and dry. No rashes or lesions. Extremities: RLE with erythema, superficial ulceration   Discharge Instructions   Discharge Instructions    Call MD for:  difficulty breathing, headache or visual disturbances   Complete by: As directed    Call MD for:  extreme fatigue   Complete by: As directed    Call MD for:  hives   Complete by: As directed    Call MD for:  persistant dizziness or light-headedness   Complete by: As directed    Call MD for:  persistant nausea and vomiting   Complete by: As directed    Call MD for:  redness, tenderness, or signs of infection (pain, swelling, redness, odor or green/yellow discharge around incision site)   Complete by: As directed    Call MD for:  severe uncontrolled pain   Complete by: As directed    Call MD for:  temperature >100.4   Complete by: As directed    Diet - low sodium heart healthy   Complete by: As directed    Diet - low sodium heart healthy   Complete by: As directed    Discharge instructions   Complete by: As directed    You were seen for [name] cellulitis of your right foot.  You've  improved with IV antibiotics.  We'll discharge you with 14 days of antibiotics.    Your MRI did not show and evidence of bone infection.  Please follow up with Dr. Posey Pronto.  You may need repeat imaging in the future if you do not continue to improve.  Continue your brace that you have at home until you follow up with Dr. Posey Pronto on Monday.  He'll help get an alternative  brace then.   Please follow up inflammatory labs outpatient with Dr. Posey Pronto.  Please follow up with Dr. Posey Pronto as scheduled.  Return for new, recurrent, or worsening symptoms.  Please ask your PCP to request records from this hospitalization so they know what was done and what the next steps will be.   Discharge wound care:   Complete by: As directed    Continue daily dressing changes with xeroform dressing, 4x4, kerlix and ace wrap   Increase activity slowly   Complete by: As directed    Increase activity slowly   Complete by: As directed    Leave dressing on - Keep it clean, dry, and  intact until clinic visit   Complete by: As directed      Allergies as of 06/13/2020      Reactions   Codeine    Food    MSG and Lactose (cannot have any dairy products)   Other Nausea And Vomiting   MSG and Lactose Intolerant   Penicillins    She states she can have penicillin derivatives      Medication List    TAKE these medications   albuterol 108 (90 Base) MCG/ACT inhaler Commonly known as: VENTOLIN HFA Inhale 1 puff into the lungs as needed for wheezing or shortness of breath.   aspirin 81 MG chewable tablet Chew 81 mg by mouth daily.   cycloSPORINE 0.05 % ophthalmic emulsion Commonly known as: RESTASIS Place 1 drop into both eyes 2 (two) times daily.   doxycycline 100 MG tablet Commonly known as: ADOXA Take 1 tablet (100 mg total) by mouth 2 (two) times daily for 14 days.   escitalopram 10 MG tablet Commonly known as: LEXAPRO Take 10 mg by mouth 2 (two) times daily.   estradiol 2 MG tablet Commonly known as:  ESTRACE Take 2 mg by mouth daily.   famotidine 10 MG tablet Commonly known as: PEPCID Take 10 mg by mouth 2 (two) times daily.   Fenofibric Acid 45 MG Cpdr Take 45 mg by mouth 3 (three) times daily.   gabapentin 300 MG capsule Commonly known as: NEURONTIN Take 1 capsule (300 mg total) by mouth 3 (three) times daily.   GAS-X EXTRA STRENGTH PO Take 1 tablet by mouth as needed (gas relief).   hydrocortisone 2.5 % cream Apply 1 application topically daily as needed (itching).   insulin lispro 100 UNIT/ML injection Commonly known as: HUMALOG Has with insulin pump   insulin pump Soln Inject 1 each into the skin. Insulin pump MEDTRONIC with humalog (Will be changing to TANDEM).Works with DEXCOM 4 CGM   lamoTRIgine 150 MG tablet Commonly known as: LAMICTAL TAKE 1 TABLET BY MOUTH TWICE DAILY   lansoprazole 30 MG capsule Commonly known as: PREVACID Take 30 mg by mouth 2 (two) times daily before Ami Thornsberry meal.   levocetirizine 5 MG tablet Commonly known as: XYZAL Take 5 mg by mouth 2 (two) times daily.   levothyroxine 112 MCG tablet Commonly known as: SYNTHROID Take 112 mcg by mouth daily.   montelukast 10 MG tablet Commonly known as: SINGULAIR Take 10 mg by mouth at bedtime.   MULTIVITAMIN GUMMIES ADULT PO Take 2 each by mouth daily.   Nasonex 50 MCG/ACT nasal spray Generic drug: mometasone Place 2 sprays into the nose daily. OTC   PANCREAZE PO Take 3-4 capsules by mouth See admin instructions. Take 4 capsules with Shellene Sweigert meal and 3 capsules with Raine Blodgett snack   primidone 50 MG tablet Commonly known as: MYSOLINE TAKE 1 TABLET BY MOUTH IN THE MORNING , 1 TABLET AT NOON AND ONE AND ONE-HALF (1 & 1/2) TABLETS IN THE EVENING What changed:   how much to take  how to take this  when to take this  additional instructions   REFRESH OPTIVE OP Place 1 drop into both eyes as needed (dry eyes).   rosuvastatin 40 MG tablet Commonly known as: CRESTOR Take 40 mg by mouth every  evening.            Discharge Care Instructions  (From admission, onward)         Start     Ordered   06/13/20 0000  Leave  dressing on - Keep it clean, dry, and intact until clinic visit        06/13/20 1443   06/13/20 0000  Discharge wound care:       Comments: Continue daily dressing changes with xeroform dressing, 4x4, kerlix and ace wrap   06/13/20 1545         Allergies  Allergen Reactions  . Codeine   . Food     MSG and Lactose (cannot have any dairy products)   . Other Nausea And Vomiting    MSG and Lactose Intolerant  . Penicillins     She states she can have penicillin derivatives      The results of significant diagnostics from this hospitalization (including imaging, microbiology, ancillary and laboratory) are listed below for reference.    Significant Diagnostic Studies: MR FOOT RIGHT WO CONTRAST  Result Date: 06/10/2020 CLINICAL DATA:  Ulcer on the lateral aspect of the foot. EXAM: MRI OF THE RIGHT FOREFOOT WITHOUT CONTRAST TECHNIQUE: Multiplanar, multisequence MR imaging of the right was performed. No intravenous contrast was administered. COMPARISON:  None. FINDINGS: Bones/Joint/Cartilage Beneath the area of interest denoted by the skin marker there is normal osseous marrow signal seen throughout within the midfoot and the proximal metatarsal bases. No osseous fracture, cortical destruction or periosteal reaction is seen. The articular surfaces appear to be maintained. No large joint effusions. Ligaments Suboptimally visualized Muscles and Tendons There is mild fatty atrophy with increased diffuse signal noted within the muscles surrounding the hindfoot. The visualized portion of the tendons are intact. The plantar fascia appears to be intact. Soft tissues Mild dorsal subcutaneous edema is seen. There is also subcutaneous edema with [place] superficial ulceration over the lateral aspect of the midfoot. No loculated fluid collections or sinus tract is seen.  IMPRESSION: No definite evidence osteomyelitis within the visualized portions of the lateral aspect of midfoot. Superficial ulceration with subcutaneous edema beneath the area of interest. No sinus tract or loculated fluid collection. Electronically Signed   By: Prudencio Pair M.D.   On: 06/10/2020 22:00   DG Foot Complete Right  Result Date: 06/09/2020 CLINICAL DATA:  Possible infection, ulcer on the lateral corner of the foot. EXAM: RIGHT FOOT COMPLETE - 3+ VIEW COMPARISON:  May 02, 2020 FINDINGS: No fracture seen. Again noted is cortical erosive type changes seen at the fifth metatarsal head with Amillion Scobee possible healed fracture deformity, as on the prior exam. Overlying area of ulceration and soft tissue swelling is seen. No other areas of cortical destruction are seen. There is mild periosteal reaction seen in the fourth metatarsal shaft. Dorsal subcutaneous edema is noted. 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 Overlying area of ulceration and soft tissue swelling is seen. Electronically Signed   By: Prudencio Pair M.D.   On: 06/09/2020 15:59   VAS [place] ABI WITH/WO TBI  Result Date: 06/13/2020 LOWER EXTREMITY DOPPLER STUDY Indications: Right diabetic foot infection. High Risk Factors: Hypertension, hyperlipidemia, Diabetes.  Comparison Study: no prior Performing Technologist: Abram Sander RVS  Examination Guidelines: Aizah Gehlhausen complete evaluation includes at minimum, Doppler waveform signals and systolic blood pressure reading at the level of bilateral brachial, anterior tibial, and posterior tibial arteries, when vessel segments are accessible. Bilateral testing is considered an integral part of Yaffa Seckman complete examination. Photoelectric Plethysmograph (PPG) waveforms and toe systolic pressure readings are included as required and additional duplex testing as needed. Limited examinations for reoccurring  indications may be performed as noted.  ABI Findings: +---------+------------------+-----+--------+--------+ Right    Rt Pressure (mmHg)IndexWaveformComment  +---------+------------------+-----+--------+--------+ Brachial 115                                     +---------+------------------+-----+--------+--------+ PTA      125               1.09                  +---------+------------------+-----+--------+--------+ DP       120               1.04                  +---------+------------------+-----+--------+--------+ Great Toe79                0.69                  +---------+------------------+-----+--------+--------+ +---------+------------------+-----+--------+-------+ Left     Lt Pressure (mmHg)IndexWaveformComment +---------+------------------+-----+--------+-------+ Brachial 113                                    +---------+------------------+-----+--------+-------+ PTA      116               1.01                 +---------+------------------+-----+--------+-------+ DP       117               1.02                 +---------+------------------+-----+--------+-------+ Great Toe89                0.77                 +---------+------------------+-----+--------+-------+ +-------+-----------+-----------+------------+------------+ ABI/TBIToday's ABIToday's TBIPrevious ABIPrevious TBI +-------+-----------+-----------+------------+------------+ Right  1.09       0.69                                +-------+-----------+-----------+------------+------------+ Left   1.02       0.77                                +-------+-----------+-----------+------------+------------+  Summary: Right: Resting right ankle-brachial index is within normal range. No evidence of significant right lower extremity arterial disease. Left: Resting left ankle-brachial index is within normal range. No evidence of significant left lower extremity arterial disease.   *See table(s) above for measurements and observations.     Preliminary     Microbiology: Recent Results (from the past 240 hour(s))  SARS Coronavirus 2 by RT PCR (hospital order, performed in [facility] hospital lab) Nasopharyngeal Nasopharyngeal Swab     Status: None   Collection Time: 06/10/20  2:18 AM   Specimen: Nasopharyngeal Swab  Result Value Ref Range Status   SARS Coronavirus 2 NEGATIVE NEGATIVE Final    Comment: (NOTE) SARS-CoV-2 target nucleic acids are NOT DETECTED.  The SARS-CoV-2 RNA is generally detectable in upper and lower respiratory specimens during the acute phase of infection. The lowest concentration of SARS-CoV-2 viral copies this assay can detect is 250 copies / mL. Didier Brandenburg negative result does not preclude SARS-CoV-2 infection and  should not be used as the sole basis for treatment or other patient management decisions.  Siyona Coto negative result may occur with improper specimen collection / handling, submission of specimen other than nasopharyngeal swab, presence of viral mutation(s) within the areas targeted by this assay, and inadequate number of viral copies (<250 copies / mL). Rosemary Mossbarger negative result must be combined with clinical observations, patient history, and epidemiological information.  Fact Sheet for Patients:   StrictlyIdeas.no  Fact Sheet for Healthcare Providers: BankingDealers.co.za  This test is not yet approved or  cleared by the [place] FDA and has been authorized for detection and/or diagnosis of SARS-CoV-2 by FDA under an Emergency Use Authorization (EUA).  This EUA will remain in effect (meaning this test can be used) for the duration of the COVID-19 declaration under Section 564(b)(1) of the Act, 21 U.S.C. section 360bbb-3(b)(1), unless the authorization is terminated or revoked sooner.  Performed at [facility], [address] [place], [place] [address]   Blood Cultures x 2 sites      Status: None (Preliminary result)   Collection Time: 06/10/20  4:38 PM   Specimen: BLOOD  Result Value Ref Range Status   Specimen Description   Final    BLOOD RIGHT ARM Performed at [facility] [address] [place], [place] [address]    Special Requests   Final    BOTTLES DRAWN AEROBIC AND ANAEROBIC Blood Culture adequate volume Performed at [facility] [address] [place], [place] [address]    Culture   Final    NO GROWTH 3 DAYS Performed at [facility], [place] [address] [place], [place] [address]    Report Status PENDING  Incomplete  Blood Cultures x 2 sites     Status: None (Preliminary result)   Collection Time: 06/10/20  4:40 PM   Specimen: BLOOD  Result Value Ref Range Status   Specimen Description   Final    BLOOD LEFT ANTECUBITAL Performed at [facility] [address] [place], [place] [address]    Special Requests   Final    BOTTLES DRAWN AEROBIC AND ANAEROBIC Blood Culture adequate volume Performed at [facility] [address] [place], [place] [address]    Culture   Final    NO GROWTH 3 DAYS Performed at [facility], [place] [address] [place],  [address]    Report Status PENDING  Incomplete     Labs: Basic Metabolic Panel: Recent Labs  Lab 06/09/20 1532 06/10/20 1638 06/11/20 1110 06/12/20 0453 06/13/20 0440  NA 137 140 140 138 137  K 4.1 4.2 4.5 4.2 4.4  CL 98 100 101 100 100  CO2 _0 GLUCOSE 151* 114* 138* 102* 153*  BUN _1 CREATININE 1.40* 1.25* 1.18* 1.19* 1.26*  CALCIUM 9.1 9.1 8.9 8.9 9.6  MG  --   --   --  2.3 2.4  PHOS  --   --   --  2.7 3.3   Liver Function Tests: Recent Labs  Lab 06/09/20 1532 06/11/20 1110 06/12/20 0453 06/13/20 0440  AST _2 ALT _3 ALKPHOS 40 36* 35* 38  BILITOT 0.3 0.1* 0.4 0.5  PROT 6.7 6.6 6.6 7.3  ALBUMIN 3.2* 3.0* 3.2* 3.4*   No results for  input(s): LIPASE, AMYLASE in the last 168 hours. No results for input(s): AMMONIA  in the last 168 hours. CBC: Recent Labs  Lab 06/09/20 1532 06/10/20 1638 06/11/20 1110 06/12/20 0453 06/13/20 0440  WBC 10.6* 11.9* 10.6* 9.1 10.4  NEUTROABS 6.4 7.0  --  5.4 6.6  HGB 11.3* 11.7* 10.8* 11.3* 11.8*  HCT 35.6* 36.7 33.3* 36.2 37.3  MCV 100.3* 100.3* 100.0 100.3* 100.3*  PLT 313 360 321 346 355   Cardiac Enzymes: No results for input(s): CKTOTAL, CKMB, CKMBINDEX, TROPONINI in the last 168 hours. BNP: BNP (last 3 results) No results for input(s): BNP in the last 8760 hours.  ProBNP (last 3 results) No results for input(s): PROBNP in the last 8760 hours.  CBG: Recent Labs  Lab 06/12/20 1116 06/12/20 1652 06/12/20 2159 06/13/20 0800 06/13/20 1130  GLUCAP 196* 174* 189* 130* 223*       Signed:  Fayrene Helper MD.  [facility] 06/13/2020, 3:50 PM

## 2020-06-13 NOTE — Consult Note (Signed)
  Subjective:  Patient ID: [name], female    DOB: 10/08/1961,  MRN: [id number]  A 59 y.o. female presents with past medical history of dropfoot in, neuropathies, gastroparesis, retinopathy with right right foot lateral frictional blister with underlying cellulitis.    Patient was seen by me at bedside.  Patient is in good spirits.  Patient states that the foot is improving the redness is essentially gone.  She is doing well on antibiotics.  She has been nonweightbearing to the right lower extremity.  She denies any other acute complaints.  She denies any nausea fever chills vomiting.  Objective:   Vitals:   06/12/20 2010 06/13/20 0631  BP: (!) 100/58 (!) 125/59  Pulse: 61 67  Resp: 16 16  Temp: (!) 97.4 F (36.3 C) (!) 97.5 F (36.4 C)  SpO2: 93% 93%   General AA&O x3. Normal mood and affect.  Vascular Dorsalis pedis and posterior tibial pulses faintly bilat. Brisk capillary refill to all digits. Pedal hair present.  Neurologic Epicritic sensation grossly intact.  Dermatologic  superficial skin ulceration status post debridement of the blister.  No underlying deep ulceration noted.  Cellulitis resolved.  It is regressing.  No concern for deep abscess.  No crepitus felt.  No bone exposure.  Orthopedic: MMT 5/5 in dorsiflexion, plantarflexion, inversion, and eversion. Normal joint ROM without pain or crepitus.    Assessment & Plan:  Patient was evaluated and treated and all questions answered.  Right lateral foot frictional blister status post doing movement with resolving cellulitis -All questions and concerns were addressed at bedside. -MRI was reviewed which showed no drainable fluid collection and no concern for underlying osteomyelitis.  These findings were related to the patient. -At this time no surgical interventions are indicated. -Clinically patient has improved considerably.  Patient can be discharged on 14 days of doxycycline. -Patient will need to be transition to  weightbearing as tolerated in a surgical shoe/postop shoe.  I will hold off on putting her in a cam boot given that this resulted due to the cam boot.  I am also hesitant to put her in her previous brace given that that led to heel ulceration which has now resolved. -Awaiting ABIs PVRs to assess vascular flow to the right lower extremity.  However given that she has had history of previous ulceration which has healed in timely fashion patient should have enough vascular flow to the lower extremity on the right side to be able to heal the superficial ulceration.  However this should not hold up the discharge I can obtain it as an outpatient setting as well. -Continue local wound care with Xeroform, Kerlix, 4 x 4 gauze, Ace bandage -I will see her in clinic next Friday.  Patient will have an appointment scheduled by my scheduler.  Candelaria Stagers, DPM  Accessible via secure chat for questions or concerns.

## 2020-06-13 NOTE — Evaluation (Signed)
Physical Therapy Evaluation Patient Details Name: [name] MRN: [id number] DOB: 1961-02-16 Today's Date: 06/13/2020   History of Present Illness  59 yr old woman with DM I with multiple related comorbidities including drop foot, neuropathies, gastroparesis, and retinopathy. She had been wearing a CAM boot on the right extremity while the ulcer on the medial aspect of the heel was healing.new ulceration on the lateral aspect of the heel, blister unroofed by her podiatrist, admitted to [facility].  Clinical Impression  Pt admitted with above diagnosis.  Pt reports limited mobility at baseline, husband and dtr supportive however they both work 5a-1p.  Pt feels she will be able to transfer to [facility] (this is the only thing she does until family returns from work).  Pt should limit mobility/amb d/t her excessive R ankle inversion, she has limited ankle control (bil lAFOs at baseline) and the post op shoe provides no medial/lateral ankle support, this places pt at risk for further ankle/foot injury and falls. Will follow in acute setting   Pt currently with functional limitations due to the deficits listed below (see PT Problem List). Pt will benefit from skilled PT to increase their independence and safety with mobility to allow discharge to the venue listed below.       Follow Up Recommendations Home health PT    Equipment Recommendations  None recommended by PT    Recommendations for Other Services       Precautions / Restrictions Precautions Precautions: Fall Required Braces or Orthoses: Other Brace Other Brace: post op shoe. wears bil AFOs at, unable to wear R since heel ulcer; pt reports her AFOs are ~ 58years old Restrictions Weight Bearing Restrictions: No Other Position/Activity Restrictions: progress to WBAT      Mobility  Bed Mobility Overal bed mobility: Needs Assistance Bed Mobility: Supine to Sit     Supine to sit: Min guard     General bed mobility comments: for  safety  Transfers Overall transfer level: Needs assistance Equipment used: Rolling walker (2 wheeled) Transfers: Sit to/from [employer] Sit to Stand: Min assist Stand pivot transfers: Min assist       General transfer comment: assist to balance, maneuver RW, cues for sequence. lateral R ankle support given by therapist  to prevent excessive inversion during stand pivot  Ambulation/Gait             General Gait Details: NT (not recommended for pt to amb in post op shoe (no medial/lateral support) d/t excessive ankle inversion that occurs in WBing as well)  Stairs            Wheelchair Mobility    Modified Rankin (Stroke Patients Only)       Balance Overall balance assessment: Needs assistance Sitting-balance support: No upper extremity supported;Feet supported Sitting balance-Leahy Scale: Good     Standing balance support: During functional activity Standing balance-Leahy Scale: Poor Standing balance comment: reliant on UEs                             Pertinent Vitals/Pain Pain Assessment: No/denies pain    Home Living Family/patient expects to be discharged to:: Private residence Living Arrangements: Spouse/significant other Available Help at Discharge: Family;Available PRN/intermittently Type of Home: House Home Access: Other (comment) (stair lift to basement)     Home Layout: One level;Laundry or work area in [employer] Equipment: Environmental consultant - 2 wheels;Walker - 4 wheels;Bedside commode;Electric scooter;Wheelchair - manual Additional Comments: husband  and dtr work 5am to 1pm. husband wakes pt at 4a to take meds. she goes back to sleep and gets up around 11a, pt only transfers to [facility] until her family returns. pt reports more limited mobility since being in [place]    Prior Function Level of Independence: Independent with assistive device(s);Needs assistance   Gait / Transfers Assistance Needed: transfers mod I at baseline,   amb very short distances in bedroom (to bathroom with rollator).           Hand Dominance        Extremity/Trunk Assessment   Upper Extremity Assessment Upper Extremity Assessment: Overall WFL for tasks assessed;Defer to OT evaluation    Lower Extremity Assessment Lower Extremity Assessment: RLE deficits/detail;LLE deficits/detail RLE Deficits / Details: drop foot. ankle eversion 0/5 (positioned in ankle inversion at rest, able to PROM to neutral). knee and hip grossly 2+ to 3/5 LLE Deficits / Details: drop foot; knee and hip grossly at least 3/5       Communication   Communication: No difficulties  Cognition Arousal/Alertness: Awake/alert Behavior During Therapy: WFL for tasks assessed/performed Overall Cognitive Status: Within Functional Limits for tasks assessed                                        General Comments      Exercises     Assessment/Plan    PT Assessment Patient needs continued PT services  PT Problem List Decreased range of motion;Decreased balance;Decreased activity tolerance;Decreased knowledge of use of DME;Decreased mobility       PT Treatment Interventions DME instruction;Therapeutic exercise;Functional mobility training;Therapeutic activities;Patient/family education;Balance training    PT Goals (Current goals can be found in the Care Plan section)  Acute Rehab PT Goals Patient Stated Goal: to go home to her dog PT Goal Formulation: With patient Time For Goal Achievement: 06/20/20 Potential to Achieve Goals: Good    Frequency Min 3X/week   Barriers to discharge        Co-evaluation               AM-PAC PT "6 Clicks" Mobility  Outcome Measure Help needed turning from your back to your side while in a flat bed without using bedrails?: A Little Help needed moving from lying on your back to sitting on the side of a flat bed without using bedrails?: A Little Help needed moving to and from a bed to a chair  (including a wheelchair)?: A Little Help needed standing up from a chair using your arms (e.g., wheelchair or bedside chair)?: A Little Help needed to walk in hospital room?: A Lot Help needed climbing 3-5 steps with a railing? : A Lot 6 Click Score: 16    End of Session Equipment Utilized During Treatment: Gait belt Activity Tolerance: Patient tolerated treatment well Patient left: in chair;with call bell/phone within reach;with chair alarm set   PT Visit Diagnosis: Difficulty in walking, not elsewhere classified (R26.2);Other abnormalities of gait and mobility (R26.89)    Time: 4431-5400 PT Time Calculation (min) (ACUTE ONLY): 89 min   Charges:   PT Evaluation $PT Eval Low Complexity: 1 Low PT Treatments $Therapeutic Activity: 8-22 mins        Delice Bison, PT  Acute Rehab Dept ([facility]) [phone] Pager [phone]  06/13/2020   [facility] 06/13/2020, 11:12 AM

## 2020-06-14 NOTE — Social Work (Signed)
CSW received a phone call from [place] at [place], she was informed that [facility] will not accept patient for home health services.  She stated they will continue to work on trying to find a home health agency for patient, and will notify CSW of any updates.  Ervin Knack. Hearl Heikes, MSW, LCSW [phone]  06/14/2020 2:52 PM

## 2020-06-15 LAB — CULTURE, BLOOD (ROUTINE X 2)
Culture: NO GROWTH
Culture: NO GROWTH
Special Requests: ADEQUATE
Special Requests: ADEQUATE

## 2020-06-16 ENCOUNTER — Other Ambulatory Visit: Payer: 59 | Admitting: Orthotics

## 2020-06-18 ENCOUNTER — Ambulatory Visit (INDEPENDENT_AMBULATORY_CARE_PROVIDER_SITE_OTHER): Payer: 59 | Admitting: Podiatry

## 2020-06-18 ENCOUNTER — Other Ambulatory Visit: Payer: Self-pay

## 2020-06-18 DIAGNOSIS — L97512 Non-pressure chronic ulcer of other part of right foot with fat layer exposed: Secondary | ICD-10-CM | POA: Diagnosis not present

## 2020-06-18 DIAGNOSIS — E108 Type 1 diabetes mellitus with unspecified complications: Secondary | ICD-10-CM

## 2020-06-18 MED ORDER — SULFAMETHOXAZOLE-TRIMETHOPRIM 800-160 MG PO TABS
1.0000 | ORAL_TABLET | Freq: Two times a day (BID) | ORAL | 1 refills | Status: DC
Start: 2020-06-18 — End: 2020-07-16

## 2020-06-19 ENCOUNTER — Encounter: Payer: Self-pay | Admitting: Podiatry

## 2020-06-19 NOTE — Progress Notes (Signed)
Subjective:  Patient ID: [name], female    DOB: 1961/09/21,  MRN: [id number]  Chief Complaint  Patient presents with  . Foot Pain    pt is here for right foot pain    59 y.o. female presents for wound care.  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  Patient states that this wound started right before hospital admission.  Patient is following up after she received 3 days of IV antibiotics and followed by discharged on doxycycline.  Patient states the dressing have also been rubbing up against it causing redness around the foot.  She states she is feeling well it better overall.  She states that there is some redness present.  She has been taking antibiotics.  She states the surgical shoe is not helping.  She is scheduled to see Raiford Noble for brace adjustments next week.   Review of Systems: Negative except as noted in the HPI. Denies N/V/F/Ch.  Past Medical History:  Diagnosis Date  . Adhesive capsulitis 2013   Bilateral shoulders  . Adhesive capsulitis of both shoulders 2013  . Anemia   . Cataract   . Chronic kidney disease   . Depression   . Diabetes mellitus without complication (HCC)   . Diabetic retinopathy (HCC) 2010  . Diabetic retinopathy (HCC) 2010   Left eye, mild  . Essential tremor 2011   Bilateral hands  . Foot drop, bilateral 2007  . Foot fracture, left   . Gastroparesis 2009  . GERD (gastroesophageal reflux disease)   . Headache   . Hemorrhage 2011   Laser surgery x2  . High potassium 2012   Normal as of 2014  . Hypercholesterolemia 2010  . Hyperlipidemia   . Neuropathy   . Pneumonia   . Postnasal drip 2010  . Scratched cornea 2013   Left eye  . Sleep apnea    does not wear CPAP  . Stage 3 chronic kidney disease   . Thyroid disease   . TMJ (temporomandibular joint disorder)   . Type 1 diabetes (HCC)     Current Outpatient Medications:  .  albuterol (PROVENTIL HFA;VENTOLIN HFA) 108 (90 BASE) MCG/ACT  inhaler, Inhale 1 puff into the lungs as needed for wheezing or shortness of breath., Disp: , Rfl:  .  aspirin 81 MG chewable tablet, Chew 81 mg by mouth daily., Disp: , Rfl:  .  Carboxymethylcellul-Glycerin (REFRESH OPTIVE OP), Place 1 drop into both eyes as needed (dry eyes). , Disp: , Rfl:  .  Choline Fenofibrate (FENOFIBRIC ACID) 45 MG CPDR, Take 45 mg by mouth 3 (three) times daily., Disp: , Rfl:  .  Continuous Blood Gluc Sensor (DEXCOM G6 SENSOR) MISC, SMARTSIG:1 Each Topical Every 10 Days, Disp: , Rfl:  .  Continuous Blood Gluc Transmit (DEXCOM G6 TRANSMITTER) MISC, , Disp: , Rfl:  .  cycloSPORINE (RESTASIS) 0.05 % ophthalmic emulsion, Place 1 drop into both eyes 2 (two) times daily. , Disp: , Rfl:  .  doxycycline (ADOXA) 100 MG tablet, Take 1 tablet (100 mg total) by mouth 2 (two) times daily for 14 days., Disp: 28 tablet, Rfl: 0 .  escitalopram (LEXAPRO) 10 MG tablet, Take 10 mg by mouth 2 (two) times daily. , Disp: , Rfl:  .  estradiol (ESTRACE) 2 MG tablet, Take 2 mg by mouth daily., Disp: , Rfl:  .  famotidine (PEPCID) 10 MG tablet, Take 10 mg by mouth 2 (two) times daily., Disp: , Rfl:  .  gabapentin (NEURONTIN) 300 MG capsule, Take 1 capsule (300 mg total) by mouth 3 (three) times daily., Disp: 180 capsule, Rfl: 0 .  hydrocortisone 2.5 % cream, Apply 1 application topically daily as needed (itching). , Disp: , Rfl:  .  Insulin Human (INSULIN PUMP) SOLN, Inject 1 each into the skin. Insulin pump MEDTRONIC with humalog (Will be changing to TANDEM).Works with DEXCOM 4 CGM, Disp: , Rfl:  .  insulin lispro (HUMALOG) 100 UNIT/ML injection, Has with insulin pump, Disp: , Rfl:  .  lamoTRIgine (LAMICTAL) 150 MG tablet, TAKE 1 TABLET BY MOUTH TWICE DAILY (Patient taking differently: Take 150 mg by mouth 2 (two) times daily. ), Disp: 180 tablet, Rfl: 3 .  lansoprazole (PREVACID) 30 MG capsule, Take 30 mg by mouth 2 (two) times daily before a meal., Disp: , Rfl:  .  levocetirizine (XYZAL) 5 MG  tablet, Take 5 mg by mouth 2 (two) times daily. , Disp: , Rfl:  .  levothyroxine (SYNTHROID, LEVOTHROID) 112 MCG tablet, Take 112 mcg by mouth daily., Disp: , Rfl:  .  mometasone (NASONEX) 50 MCG/ACT nasal spray, Place 2 sprays into the nose daily. OTC, Disp: , Rfl:  .  montelukast (SINGULAIR) 10 MG tablet, Take 10 mg by mouth at bedtime., Disp: , Rfl:  .  Multiple Vitamins-Minerals (MULTIVITAMIN GUMMIES ADULT PO), Take 2 each by mouth daily. , Disp: , Rfl:  .  Pancrelipase, Lip-Prot-Amyl, (PANCREAZE PO), Take 3-4 capsules by mouth See admin instructions. Take 4 capsules with a meal and 3 capsules with a snack, Disp: , Rfl:  .  primidone (MYSOLINE) 50 MG tablet, TAKE 1 TABLET BY MOUTH IN THE MORNING , 1 TABLET AT NOON AND ONE AND ONE-HALF (1 & 1/2) TABLETS IN THE EVENING (Patient taking differently: Take 50-75 mg by mouth See admin instructions. Take 50mg  in the morning, 50mg  at noon and 75mg  in the evening.), Disp: 315 tablet, Rfl: 3 .  rosuvastatin (CRESTOR) 40 MG tablet, Take 40 mg by mouth every evening. , Disp: , Rfl:  .  Simethicone (GAS-X EXTRA STRENGTH PO), Take 1 tablet by mouth as needed (gas relief). , Disp: , Rfl:  .  sulfamethoxazole-trimethoprim (BACTRIM DS) 800-160 MG tablet, Take 1 tablet by mouth 2 (two) times daily., Disp: 28 tablet, Rfl: 1  Social History   Tobacco Use  Smoking Status Never Smoker  Smokeless Tobacco Never Used    Allergies  Allergen Reactions  . Codeine   . Food     MSG and Lactose (cannot have any dairy products)   . Other Nausea And Vomiting    MSG and Lactose Intolerant  . Penicillins     She states she can have penicillin derivatives   Objective:  There were no vitals filed for this visit. There is no height or weight on file to calculate BMI. Constitutional Well developed. Well nourished.  Vascular Dorsalis pedis pulses palpable bilaterally. Posterior tibial pulses palpable bilaterally. Capillary refill normal to all digits.  No cyanosis  or clubbing noted. Pedal hair growth normal.  Neurologic Normal speech. Oriented to person, place, and time. Protective sensation absent  Dermatologic Wound Location: Right lateral foot at submetatarsal 5 with fibrogranular wound structure.  Does not probe down to bone.  Bone prominence is noted.  Cavovarus foot structure noted. Wound Base: Mixed Granular/Fibrotic Peri-wound: Calloused Exudate: Scant/small amount Serous exudate Wound Measurements: -See below  Orthopedic: No pain to palpation either foot.   Radiographs: None Assessment:   1. Diabetes mellitus type 1 with  complications (HCC)   2. Right foot ulcer, with fat layer exposed (HCC)    Plan:  Patient was evaluated and treated and all questions answered.  Ulcer right lateral fifth metatarsal wound with fat layer exposed -Debridement as below. -Dressed with Betadine wet-to-dry, DSD. -Continue off-loading with surgical shoe and Tri-Lock ankle brace for stability. -Patient is not tolerating doxycycline well as is causing her nausea.  I will change her to Bactrim.  Bactrim was dispensed to pharmacy -Tri-Lock ankle brace was provided for stability at the level of the ankle joint.  Procedure: Excisional Debridement of Wound Tool: Sharp chisel blade/tissue nipper Rationale: Removal of non-viable soft tissue from the wound to promote healing.  Anesthesia: none Pre-Debridement Wound Measurements: 1 cm x 0.7 cm x 0.3 cm  Post-Debridement Wound Measurements: 1.1 cm x 0.8 cm x 0.3 cm  Type of Debridement: Sharp Excisional Tissue Removed: Non-viable soft tissue Blood loss: Minimal (<50cc) Depth of Debridement: subcutaneous tissue. Technique: Sharp excisional debridement to bleeding, viable wound base.  Wound Progress: This is my initial evaluation of the wound after the hospital.  I will continue to monitor progression of it. Site healing conversation 7 Dressing: Dry, sterile, compression dressing. Disposition: Patient tolerated  procedure well. Patient to return in 1 week for follow-up.  No follow-ups on file.

## 2020-06-20 ENCOUNTER — Ambulatory Visit: Payer: 59 | Admitting: Podiatry

## 2020-06-24 ENCOUNTER — Other Ambulatory Visit: Payer: Self-pay

## 2020-06-24 ENCOUNTER — Ambulatory Visit: Payer: 59 | Admitting: Orthotics

## 2020-06-24 DIAGNOSIS — E11628 Type 2 diabetes mellitus with other skin complications: Secondary | ICD-10-CM

## 2020-06-24 DIAGNOSIS — E108 Type 1 diabetes mellitus with unspecified complications: Secondary | ICD-10-CM

## 2020-06-24 DIAGNOSIS — L97412 Non-pressure chronic ulcer of right heel and midfoot with fat layer exposed: Secondary | ICD-10-CM

## 2020-06-24 DIAGNOSIS — L97512 Non-pressure chronic ulcer of other part of right foot with fat layer exposed: Secondary | ICD-10-CM

## 2020-06-24 NOTE — Progress Notes (Signed)
Sent to [facility] w/ Rx from [facility] for b /l foot drop braces; RT one need to offload diabetic ulcer lateral border.

## 2020-07-04 ENCOUNTER — Ambulatory Visit (INDEPENDENT_AMBULATORY_CARE_PROVIDER_SITE_OTHER): Payer: 59 | Admitting: Podiatry

## 2020-07-04 ENCOUNTER — Other Ambulatory Visit: Payer: Self-pay

## 2020-07-04 DIAGNOSIS — E108 Type 1 diabetes mellitus with unspecified complications: Secondary | ICD-10-CM | POA: Diagnosis not present

## 2020-07-04 DIAGNOSIS — L97512 Non-pressure chronic ulcer of other part of right foot with fat layer exposed: Secondary | ICD-10-CM

## 2020-07-04 DIAGNOSIS — L853 Xerosis cutis: Secondary | ICD-10-CM | POA: Diagnosis not present

## 2020-07-04 MED ORDER — AMMONIUM LACTATE 12 % EX LOTN: 1.0000 "application " | TOPICAL_LOTION | CUTANEOUS | 0 refills | Status: AC | PRN

## 2020-07-06 ENCOUNTER — Encounter: Payer: Self-pay | Admitting: Podiatry

## 2020-07-06 NOTE — Progress Notes (Signed)
Subjective:  Patient ID: [name], female    DOB: 1961-07-24,  MRN: [id number]  Chief Complaint  Patient presents with  . Wound Check    pt is here for a wound check of the right foot. Pt states that the right foot is showing some redness around the foot. Pt also states that the foot is elevated to the touch. Pt also states that she is not able to wear the trilock brace     59 y.o. female presents for wound care.  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  Patient states that she is doing well.  She has been using the cam boot.  The Bactrim has been helping.  The ulcer is staying stagnant has not gotten better.  They have been doing local wound care.  She also has secondary complaint of dry skin.  She would like to know if there is anything that can be sent for dry skin.   Review of Systems: Negative except as noted in the HPI. Denies N/V/F/Ch.  Past Medical History:  Diagnosis Date  . Adhesive capsulitis 2013   Bilateral shoulders  . Adhesive capsulitis of both shoulders 2013  . Anemia   . Cataract   . Chronic kidney disease   . Depression   . Diabetes mellitus without complication (HCC)   . Diabetic retinopathy (HCC) 2010  . Diabetic retinopathy (HCC) 2010   Left eye, mild  . Essential tremor 2011   Bilateral hands  . Foot drop, bilateral 2007  . Foot fracture, left   . Gastroparesis 2009  . GERD (gastroesophageal reflux disease)   . Headache   . Hemorrhage 2011   Laser surgery x2  . High potassium 2012   Normal as of 2014  . Hypercholesterolemia 2010  . Hyperlipidemia   . Neuropathy   . Pneumonia   . Postnasal drip 2010  . Scratched cornea 2013   Left eye  . Sleep apnea    does not wear CPAP  . Stage 3 chronic kidney disease   . Thyroid disease   . TMJ (temporomandibular joint disorder)   . Type 1 diabetes (HCC)     Current Outpatient Medications:  .  albuterol  (PROVENTIL HFA;VENTOLIN HFA) 108 (90 BASE) MCG/ACT inhaler, Inhale 1 puff into the lungs as needed for wheezing or shortness of breath., Disp: , Rfl:  .  aspirin 81 MG chewable tablet, Chew 81 mg by mouth daily., Disp: , Rfl:  .  Carboxymethylcellul-Glycerin (REFRESH OPTIVE OP), Place 1 drop into both eyes as needed (dry eyes). , Disp: , Rfl:  .  Choline Fenofibrate (FENOFIBRIC ACID) 45 MG CPDR, Take 45 mg by mouth 3 (three) times daily., Disp: , Rfl:  .  Continuous Blood Gluc Sensor (DEXCOM G6 SENSOR) MISC, SMARTSIG:1 Each Topical Every 10 Days, Disp: , Rfl:  .  Continuous Blood Gluc Transmit (DEXCOM G6 TRANSMITTER) MISC, , Disp: , Rfl:  .  cycloSPORINE (RESTASIS) 0.05 % ophthalmic emulsion, Place 1 drop into both eyes 2 (two) times daily. , Disp: , Rfl:  .  DULERA 100-5 MCG/ACT AERO, Inhale 2 puffs into the lungs 2 (two) times daily., Disp: , Rfl:  .  escitalopram (LEXAPRO) 10 MG tablet, Take 10 mg by mouth 2 (two) times daily. , Disp: , Rfl:  .  estradiol (ESTRACE) 2 MG tablet, Take 2 mg by mouth daily., Disp: , Rfl:  .  famotidine (PEPCID) 10 MG tablet,  Take 10 mg by mouth 2 (two) times daily., Disp: , Rfl:  .  gabapentin (NEURONTIN) 300 MG capsule, Take 1 capsule (300 mg total) by mouth 3 (three) times daily., Disp: 180 capsule, Rfl: 0 .  hydrocortisone 2.5 % cream, Apply 1 application topically daily as needed (itching). , Disp: , Rfl:  .  Insulin Human (INSULIN PUMP) SOLN, Inject 1 each into the skin. Insulin pump MEDTRONIC with humalog (Will be changing to TANDEM).Works with DEXCOM 4 CGM, Disp: , Rfl:  .  insulin lispro (HUMALOG) 100 UNIT/ML injection, Has with insulin pump, Disp: , Rfl:  .  lamoTRIgine (LAMICTAL) 150 MG tablet, TAKE 1 TABLET BY MOUTH TWICE DAILY (Patient taking differently: Take 150 mg by mouth 2 (two) times daily. ), Disp: 180 tablet, Rfl: 3 .  lansoprazole (PREVACID) 30 MG capsule, Take 30 mg by mouth 2 (two) times daily before a meal., Disp: , Rfl:  .  levocetirizine  (XYZAL) 5 MG tablet, Take 5 mg by mouth 2 (two) times daily. , Disp: , Rfl:  .  levothyroxine (SYNTHROID, LEVOTHROID) 112 MCG tablet, Take 112 mcg by mouth daily., Disp: , Rfl:  .  mometasone (NASONEX) 50 MCG/ACT nasal spray, Place 2 sprays into the nose daily. OTC, Disp: , Rfl:  .  montelukast (SINGULAIR) 10 MG tablet, Take 10 mg by mouth at bedtime., Disp: , Rfl:  .  Multiple Vitamins-Minerals (MULTIVITAMIN GUMMIES ADULT PO), Take 2 each by mouth daily. , Disp: , Rfl:  .  Pancrelipase, Lip-Prot-Amyl, (PANCREAZE PO), Take 3-4 capsules by mouth See admin instructions. Take 4 capsules with a meal and 3 capsules with a snack, Disp: , Rfl:  .  primidone (MYSOLINE) 50 MG tablet, TAKE 1 TABLET BY MOUTH IN THE MORNING , 1 TABLET AT NOON AND ONE AND ONE-HALF (1 & 1/2) TABLETS IN THE EVENING (Patient taking differently: Take 50-75 mg by mouth See admin instructions. Take 50mg  in the morning, 50mg  at noon and 75mg  in the evening.), Disp: 315 tablet, Rfl: 3 .  rosuvastatin (CRESTOR) 40 MG tablet, Take 40 mg by mouth every evening. , Disp: , Rfl:  .  Simethicone (GAS-X EXTRA STRENGTH PO), Take 1 tablet by mouth as needed (gas relief). , Disp: , Rfl:  .  sulfamethoxazole-trimethoprim (BACTRIM DS) 800-160 MG tablet, Take 1 tablet by mouth 2 (two) times daily., Disp: 28 tablet, Rfl: 1 .  ammonium lactate (AMLACTIN) 12 % lotion, Apply 1 application topically as needed for dry skin., Disp: 400 g, Rfl: 0  Social History   Tobacco Use  Smoking Status Never Smoker  Smokeless Tobacco Never Used    Allergies  Allergen Reactions  . Codeine   . Food     MSG and Lactose (cannot have any dairy products)   . Other Nausea And Vomiting    MSG and Lactose Intolerant  . Penicillins     She states she can have penicillin derivatives   Objective:  There were no vitals filed for this visit. There is no height or weight on file to calculate BMI. Constitutional Well developed. Well nourished.  Vascular Dorsalis  pedis pulses palpable bilaterally. Posterior tibial pulses palpable bilaterally. Capillary refill normal to all digits.  No cyanosis or clubbing noted. Pedal hair growth normal.  Neurologic Normal speech. Oriented to person, place, and time. Protective sensation absent  Dermatologic Wound Location: Right lateral foot at submetatarsal 5 with fibrogranular wound structure.  Does not probe down to bone.  Bone prominence is noted.  Cavovarus foot structure  noted. Wound Base: Mixed Granular/Fibrotic Peri-wound: Calloused Exudate: Scant/small amount Serous exudate Wound Measurements: -See below  Orthopedic: No pain to palpation either foot.   Radiographs: None Assessment:   1. 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)   2. Right foot ulcer, with fat layer exposed (HCC)   3. Xerosis cutis    Plan:  Patient was evaluated and treated and all questions answered.  Ulcer right lateral fifth metatarsal wound with fat layer exposed~stagnant -Debridement as below. -Dressed with Betadine wet-to-dry, DSD. -Continue off-loading with surgical shoe and Tri-Lock ankle brace for stability. -No further antibiotics are needed at this time as is not clinically infected. -Tri-Lock ankle brace was provided for stability at the level of the ankle joint. -Patient was [facility] at [facility] clinic for which she will pick up a new custom brace on August 30. -At this time patient wound has become stagnant and until the reduction of pressure is applied with a new brace for now we will continue doing local wound care.  Patient states understanding  Xerosis bilateral lower extremity -I explained patient the etiology of xerosis and various treatment options were discussed.  Given that patient has failed over-the-counter therapy/moisturization I believe she will benefit from prescription lotion.  Ammonium lactate was sent to the pharmacy.  Procedure: Excisional Debridement of Wound~stagnant Tool: Sharp chisel  blade/tissue nipper Rationale: Removal of non-viable soft tissue from the wound to promote healing.  Anesthesia: none Pre-Debridement Wound Measurements: 1 cm x 0.7 cm x 0.3 cm  Post-Debridement Wound Measurements: 1.1 cm x 0.8 cm x 0.3 cm  Type of Debridement: Sharp Excisional Tissue Removed: Non-viable soft tissue Blood loss: Minimal (<50cc) Depth of Debridement: subcutaneous tissue. Technique: Sharp excisional debridement to bleeding, viable wound base.  Wound Progress: This is my initial evaluation of the wound after the hospital.  I will continue to monitor progression of it. Site healing conversation 7 Dressing: Dry, sterile, compression dressing. Disposition: Patient tolerated procedure well. Patient to return in 1 week for follow-up.  No follow-ups on file.

## 2020-07-14 ENCOUNTER — Encounter (INDEPENDENT_AMBULATORY_CARE_PROVIDER_SITE_OTHER): Payer: Managed Care, Other (non HMO) | Admitting: Ophthalmology

## 2020-07-16 ENCOUNTER — Ambulatory Visit: Payer: Managed Care, Other (non HMO) | Admitting: Family Medicine

## 2020-07-16 ENCOUNTER — Telehealth: Payer: Self-pay | Admitting: *Deleted

## 2020-07-16 ENCOUNTER — Encounter: Payer: Self-pay | Admitting: Family Medicine

## 2020-07-16 VITALS — BP 115/63 | HR 70 | Ht 64.0 in

## 2020-07-16 DIAGNOSIS — E1042 Type 1 diabetes mellitus with diabetic polyneuropathy: Secondary | ICD-10-CM | POA: Diagnosis not present

## 2020-07-16 DIAGNOSIS — F444 Conversion disorder with motor symptom or deficit: Secondary | ICD-10-CM | POA: Diagnosis not present

## 2020-07-16 NOTE — Telephone Encounter (Signed)
I called spoke to [facility] she gets:  Gabapentin 300mg  #270 last till 05/26/20 no refills left, done by 05/28/20 MD ([facility]).  Lamotrigine 150mg  #60 no refills.  Last fill 07-03-20 ( [facility]).  No primidone.  [employer] [facility]  Gets Primidone 50mg  #315 one refill left by 09-14-1997 NP.  No gabapentin to lamotrigine.

## 2020-07-16 NOTE — Progress Notes (Addendum)
PATIENT: [name] DOB: February 15, 1961  REASON FOR VISIT: follow up HISTORY FROM: patient  Chief Complaint  Patient presents with  . Follow-up    F/U for neuropathy, states she has been doing well since last visit.   Marland Kitchen room 2    with husband     HISTORY OF PRESENT ILLNESS: Today 07/16/20 [name] is a 59 y.o. female here today for follow up for tremor/jerking movements. She continues primidone 50mg  in am, 50mg  at lunch and 75mg  in the evenings. She is also taking gabapentin and lamotrigine for peripheral neuropathy that are managed by PCP. She was seen by Dr (neurology) and Dr (nueropsychiatry), both with [facility] and diagnosed with functional movement disorder. She reports that, overall, she is doing ok. Tremor continues to bother her. She reports having a difficult time clipping her nails. Today, it took her about 2.5 hours to clip and file her nails due to tremor. No tremor noted in office today. She reports that neuropathy is stable. She continues to note burning but feels that gabapentin and lamotrigine are working to stabilize symptoms. She mostly uses her wheelchair. She has had a difficult time with nonhealing foot wounds/blisters due to different boots/braces. She reports that Dr Arlana Pouch continues to follow up closely. Would are starting to heal.   She reports that Dr [name] is retiring the end of this year. She has spoken with [facility] who has reportedly agreed to continue lamotrigine and gabapentin. Last refilled by Dr Allena Katz. We have had a lengthy discussion regarding continuity of care. We are no longer making any changes in her care plan. She is stable on current medications with no adjustments in plan.   HISTORY: (copied from my note on 05/28/2019)  [name] is a 59 y.o. female here today for follow up of tremor/jerking movements. She has complicated PMH including DMT1 with retinopathy, neuropathy, CKD, gastroparesis, hx of DKA on insulin pump,  bilateral foot drop, orthostatic hypotension, thyroid disease, HLD, asthma, depression, MGUS, sleep apnea (not on CPAP) and essential tremor. She was seen virtually on 03/01/2019 and advised to continue primidone, lamictal and gabapentin as prescribed. She reports that her hand tremor and muscle jerking has not improved. She continues to have random jerking movements of her whole body, worse in hands. She reports today that she has noticed jerking movements of her stomach. She is using a wheelchair for mobilization outside of the home and a walker or scooter inside home. She reports multiple falls; no known injuries. She has peripheral neuropathy with foot drop bilaterally requiring braces. She was advised to participate in PT last year by Dr [name] but did not complete. She does not feel this will be beneficial. She has been evaluated by a movement specialist (Dr 59) at [facility] and a neuropsychiatrist (Dr Arlana Pouch) and diagnosed with functional movement disorder. I am unable to locate note from Dr Arlana Pouch. She can not tolerate increased doses of primidone or gabapentin due to sleepiness and dizziness.   She also complains of dizziness with position changes This has been ongoing for years and has been diagnosed with orthostatic hypotension but has been worse over the past month. She has scheduled a visit with Dr [facility] (PCP) to discuss. She does monitor blood pressure occasionally.   HISTORY: (copied from my note on 05/01/2019)  [name] a 59 y.o.femalefor follow up. She reports that her tremor continues to have adverse effects on her daily activities. She reports  that her husband will hold her hand as she takes her medications placed in pudding. She is unable to type on her computer. She continues primidone 50 mg in the morning, 50 mg at noon and 75 mg in the evenings. She is taking gabapentin 300 mg 3 times daily. At her last visit in December 2018 she reported that her tremor was worse. She was  unable to write. She has a very complicated history and is on multiple medications. We had tried to increase primidone but she was unable to tolerate this. We have also tried to increase Neurontin however side effects limited therapy. Her blood pressures are always fairly low. Propranolol not an option at this time. She is taking Lamictal as well. Dr. Daisy Blossom had mentioned to her that maybe we should consider a referral to [facility] for ultrasound or deep brain stimulation to augment current therapy. She was evaluated and told that "the type oftremor she had was not treatable."She was then seen by a neuro physiologist who was uncertain on how to treat her tremor. She has not been seen since. She was advised to see neuropsychology for formal assessment of functional neurological disorder with abnormal movement.  History (copied from dr Trevor Mace note on 11/02/2017)  Tremor is worse. She cant use her mouse, she can;t write, it is illegible.She has a complicated history, is on multiple drugs, could not tolerate increase in primidone or neurontin, BP too low for propranolol. Hesistate to try any other medication,unable to type on a computer. Also on lamictal. At this point have nothing else to offer her, recommend [facility] for [place] thalamic treatment or DBS or continuing as is with current treatment.  Interval update 10/28/2016: Her hands are still shaky. She can't eat without getting food on her shirt. She can;t take primidone, sleeping too much. She takes it three timed a day, does not want to take it all at night. Takes gabapentin three times a day.They help with the tremor and the pain. Discussed propranolol and clonazepam.Patient has low BP, would have to watch her BP and pulse very carefully, stop for any side effects especially lightheadedness, dizziness, chest pain, stay well hydrated take daly BP and pulse.  Interval update;Tremor is improved by the primidone and the gabapentin.  But the tremor is still impairing her ability to write and her ability to ability to text. She is on neurontin and the primidone. Recent increase in primidone didn't help. And the primidone is making her tired. Her blood pressure is always low and this is likely why she hasn't tried propranolol. She is having memory problems. She is having word-finding difficulties.   HPI: [name] is a 59 y.o. female here as a referral from Dr. Zachery Dauer for tremor. She has very complicated PMHx. DM 1 with complications of retinopathy and neuropathy and chronic renal insufficiency and gastroparesis and frozen shoulder, DKA, orthostatic hypotension, with insulin pump, neuropathy, bilateral foot drop, thyroid disease, HLD, asthma, depression, MGUS, essential tremor. Sleep apnea does not use cpap  She has had tremors. For 5 years. It is worsening. In the hands. Sometimes they are so bad, husband has to hold her hands and she can't stop them even her whole arm shakes. Progressively, slowly getting worse. No caffeine. No coffee. Primidone helped in the past. Doesn't notice it in the mouth or legs. Has worsening neuropathy and was being followed by a neurologist who did a full evaluation. Does not drink alcohol. tremor interferes with eating and cutting food. Had MRi  of the brain a few years ago which was negative. She has worsening memory problems. No FHx neuropathy or neurmuscular disease.   REVIEW OF SYSTEMS: Out of a complete 14 system review of symptoms, the patient complains only of the following symptoms, tremor, numbness, chronic pain, anxiety, depression, and all other reviewed systems are negative.   ALLERGIES: Allergies  Allergen Reactions  . Codeine   . Food     MSG and Lactose (cannot have any dairy products)   . Other Nausea And Vomiting    MSG and Lactose Intolerant  . Penicillins     She states she can have penicillin derivatives    HOME MEDICATIONS: Outpatient Medications Prior to Visit    Medication Sig Dispense Refill  . albuterol (PROVENTIL HFA;VENTOLIN HFA) 108 (90 BASE) MCG/ACT inhaler Inhale 1 puff into the lungs as needed for wheezing or shortness of breath.    Marland Kitchen. ammonium lactate (AMLACTIN) 12 % lotion Apply 1 application topically as needed for dry skin. 400 g 0  . aspirin 81 MG chewable tablet Chew 81 mg by mouth daily.    . Carboxymethylcellul-Glycerin (REFRESH OPTIVE OP) Place 1 drop into both eyes as needed (dry eyes).     . Choline Fenofibrate (FENOFIBRIC ACID) 45 MG CPDR Take 45 mg by mouth 3 (three) times daily.    . Continuous Blood Gluc Sensor (DEXCOM G6 SENSOR) MISC SMARTSIG:1 Each Topical Every 10 Days    . Continuous Blood Gluc Transmit (DEXCOM G6 TRANSMITTER) MISC     . cycloSPORINE (RESTASIS) 0.05 % ophthalmic emulsion Place 1 drop into both eyes 2 (two) times daily.     . DULERA 100-5 MCG/ACT AERO Inhale 2 puffs into the lungs 2 (two) times daily.    Marland Kitchen. escitalopram (LEXAPRO) 10 MG tablet Take 10 mg by mouth 2 (two) times daily.     Marland Kitchen. estradiol (ESTRACE) 2 MG tablet Take 2 mg by mouth daily.    . famotidine (PEPCID) 10 MG tablet Take 10 mg by mouth 2 (two) times daily.    Marland Kitchen. gabapentin (NEURONTIN) 300 MG capsule Take 1 capsule (300 mg total) by mouth 3 (three) times daily. 180 capsule 0  . hydrocortisone 2.5 % cream Apply 1 application topically daily as needed (itching).     . Insulin Human (INSULIN PUMP) SOLN Inject 1 each into the skin. Insulin pump MEDTRONIC with humalog (Will be changing to TANDEM).Works with DEXCOM 4 CGM    . insulin lispro (HUMALOG) 100 UNIT/ML injection Has with insulin pump    . lamoTRIgine (LAMICTAL) 150 MG tablet TAKE 1 TABLET BY MOUTH TWICE DAILY (Patient taking differently: Take 150 mg by mouth 2 (two) times daily. ) 180 tablet 3  . lansoprazole (PREVACID) 30 MG capsule Take 30 mg by mouth 2 (two) times daily before a meal.    . levocetirizine (XYZAL) 5 MG tablet Take 5 mg by mouth 2 (two) times daily.     Marland Kitchen. levothyroxine  (SYNTHROID, LEVOTHROID) 112 MCG tablet Take 112 mcg by mouth daily.    . mometasone (NASONEX) 50 MCG/ACT nasal spray Place 2 sprays into the nose daily. OTC    . montelukast (SINGULAIR) 10 MG tablet Take 10 mg by mouth at bedtime.    . Multiple Vitamins-Minerals (MULTIVITAMIN GUMMIES ADULT PO) Take 2 each by mouth daily.     . Pancrelipase, Lip-Prot-Amyl, (PANCREAZE PO) Take 3-4 capsules by mouth See admin instructions. Take 4 capsules with a meal and 3 capsules with a snack    .  primidone (MYSOLINE) 50 MG tablet TAKE 1 TABLET BY MOUTH IN THE MORNING , 1 TABLET AT NOON AND ONE AND ONE-HALF (1 & 1/2) TABLETS IN THE EVENING (Patient taking differently: Take 50-75 mg by mouth See admin instructions. Take  in the morning,  at noon and  in the evening.) 315 tablet 3  . rosuvastatin (CRESTOR) 40 MG tablet Take 40 mg by mouth every evening.     . Simethicone (GAS-X EXTRA STRENGTH PO) Take 1 tablet by mouth as needed (gas relief).     Marland Kitchen sulfamethoxazole-trimethoprim (BACTRIM DS) 800-160 MG tablet Take 1 tablet by mouth 2 (two) times daily. 28 tablet 1   No facility-administered medications prior to visit.    PAST MEDICAL HISTORY: Past Medical History:  Diagnosis Date  . Adhesive capsulitis 2013   Bilateral shoulders  . Adhesive capsulitis of both shoulders 2013  . Anemia   . Cataract   . Chronic kidney disease   . Depression   . Diabetes mellitus without complication (HCC)   . Diabetic retinopathy (HCC) 2010  . Diabetic retinopathy (HCC) 2010   Left eye, mild  . Essential tremor 2011   Bilateral hands  . Foot drop, bilateral 2007  . Foot fracture, left   . Gastroparesis 2009  . GERD (gastroesophageal reflux disease)   . Headache   . Hemorrhage 2011   Laser surgery x2  . High potassium 2012   Normal as of 2014  . Hypercholesterolemia 2010  . Hyperlipidemia   . Neuropathy   . Pneumonia   . Postnasal drip 2010  . Scratched cornea 2013   Left eye  . Sleep apnea    does  not wear CPAP  . Stage 3 chronic kidney disease   . Thyroid disease   . TMJ (temporomandibular joint disorder)   . Type 1 diabetes (HCC)     PAST SURGICAL HISTORY: Past Surgical History:  Procedure Laterality Date  . ABDOMINAL HYSTERECTOMY  2007  . APPENDECTOMY    . BALLOON DILATION N/A 01/24/2017   Procedure: BALLOON DILATION;  Surgeon: Charlott Rakes, MD;  Location: [facility];  Service: Endoscopy;  Laterality: N/A;  . CARPAL TUNNEL RELEASE Right 1990  . CATARACT EXTRACTION Bilateral 2008  . COLONOSCOPY  2007  . COLONOSCOPY WITH PROPOFOL N/A 01/24/2017   Procedure: COLONOSCOPY WITH PROPOFOL;  Surgeon: Charlott Rakes, MD;  Location: [facility];  Service: Endoscopy;  Laterality: N/A;  . ESOPHAGOGASTRODUODENOSCOPY (EGD) WITH PROPOFOL N/A 01/24/2017   Procedure: ESOPHAGOGASTRODUODENOSCOPY (EGD) WITH PROPOFOL;  Surgeon: Charlott Rakes, MD;  Location: [facility];  Service: Endoscopy;  Laterality: N/A;  moved oer MD request  . EYE SURGERY Left 2012   Pars plana viterctomy, membrane peel, endolaser  . ROOT CANAL  2007  . ROOT CANAL  2010  . Thorat dilation  2007  . Throat dilation  2007  . Throat dilation  2010, 0ct 2011   Sep 2010, Nov 2010, 2014  . TONSILLECTOMY  1971  . Trigger fingers  2008  . WISDOM TOOTH EXTRACTION  1977    FAMILY HISTORY: Family History  Problem Relation Age of Onset  . Hypertension Mother   . Diabetes Father   . COPD Father   . Pneumonia Father   . Stroke Maternal Grandmother   . Hypertension Maternal Grandmother   . Diabetes Maternal Grandfather   . Parkinson's disease Maternal Grandfather   . Heart disease Maternal Grandfather   . Parkinsonism Neg Hx   . Neuropathy Neg Hx   . Breast cancer  Neg Hx     SOCIAL HISTORY: Social History   Socioeconomic History  . Marital status: Married    Spouse name: Theron Arista  . Number of children: 2  . Years of education: Ba  . Highest education level: Not on file  Occupational History  . Not on  file  Tobacco Use  . Smoking status: Never Smoker  . Smokeless tobacco: Never Used  Vaping Use  . Vaping Use: Never used  Substance and Sexual Activity  . Alcohol use: No    Alcohol/week: 0.0 standard drinks  . Drug use: No  . Sexual activity: Not on file  Other Topics Concern  . Not on file  Social History Narrative   Lives at home with husband and daughter.   Caffeine use: 1 soda per day   Right handed   Social Determinants of Health   Financial Resource Strain:   . Difficulty of Paying Living Expenses:   Food Insecurity:   . Worried About Programme researcher, broadcasting/film/video in the Last Year:   . Barista in the Last Year:   Transportation Needs:   . Freight forwarder (Medical):   Marland Kitchen Lack of Transportation (Non-Medical):   Physical Activity:   . Days of Exercise per Week:   . Minutes of Exercise per Session:   Stress:   . Feeling of Stress :   Social Connections:   . Frequency of Communication with Friends and Family:   . Frequency of Social Gatherings with Friends and Family:   . Attends Religious Services:   . Active Member of Clubs or Organizations:   . Attends Banker Meetings:   Marland Kitchen Marital Status:   Intimate Partner Violence:   . Fear of Current or Ex-Partner:   . Emotionally Abused:   Marland Kitchen Physically Abused:   . Sexually Abused:       PHYSICAL EXAM  Vitals:   07/16/20 1530  BP: 115/63  Pulse: 70  Height: 5\' 4"  (1.626 m)   Body mass index is 28 kg/m.  Generalized: Well developed, in no acute distress  Cardiology: normal rate and rhythm, no murmur noted Respiratory: clear to auscultation bilaterally  Neurological examination  Mentation: Alert oriented to time, place, history taking. Follows all commands speech and language fluent Cranial nerve II-XII: Pupils were equal round reactive to light. Extraocular movements were full, visual field were full  Motor: The motor testing reveals 4/5 strength of all 4 extremities. Good symmetric motor tone  is noted throughout.  Gait and station: Gait not assessed at today's visit as she is in a wheelchair and unable to walk due to right foot wound.   DIAGNOSTIC DATA (LABS, IMAGING, TESTING) - I reviewed patient records, labs, notes, testing and imaging myself where available.  No flowsheet data found.   Lab Results  Component Value Date   WBC 10.4 06/13/2020   HGB 11.8 (L) 06/13/2020   HCT 37.3 06/13/2020   MCV 100.3 (H) 06/13/2020   PLT 355 06/13/2020      Component Value Date/Time   NA 137 06/13/2020 0440   K 4.4 06/13/2020 0440   CL 100 06/13/2020 0440   CO2 28 06/13/2020 0440   GLUCOSE 153 (H) 06/13/2020 0440   BUN 20 06/13/2020 0440   CREATININE 1.26 (H) 06/13/2020 0440   CALCIUM 9.6 06/13/2020 0440   PROT 7.3 06/13/2020 0440   ALBUMIN 3.4 (L) 06/13/2020 0440   AST 17 06/13/2020 0440   ALT 11 06/13/2020 0440  ALKPHOS 38 06/13/2020 0440   BILITOT 0.5 06/13/2020 0440   GFRNONAA 47 (L) 06/13/2020 0440   GFRAA 54 (L) 06/13/2020 0440   No results found for: CHOL, HDL, LDLCALC, LDLDIRECT, TRIG, CHOLHDL Lab Results  Component Value Date   HGBA1C 5.0 06/10/2020   No results found for: VITAMINB12 No results found for: TSH     ASSESSMENT AND PLAN 59 y.o. year old female  has a past medical history of Adhesive capsulitis (2013), Adhesive capsulitis of both shoulders (2013), Anemia, Cataract, Chronic kidney disease, Depression, Diabetes mellitus without complication (HCC), Diabetic retinopathy (HCC) (2010), Diabetic retinopathy (HCC) (2010), Essential tremor (2011), Foot drop, bilateral (2007), Foot fracture, left, Gastroparesis (2009), GERD (gastroesophageal reflux disease), Headache, Hemorrhage (2011), High potassium (2012), Hypercholesterolemia (2010), Hyperlipidemia, Neuropathy, Pneumonia, Postnasal drip (2010), Scratched cornea (2013), Sleep apnea, Stage 3 chronic kidney disease, Thyroid disease, TMJ (temporomandibular joint disorder), and Type 1 diabetes (HCC). here with       ICD-10-CM   1. 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 Diabetic polyneuropathy associated with type 1 diabetes mellitus (HCC)  E10.42       [name] is doing fairly well, today. She reports that tremor and neuropathy are stable on current treatment regimen. I have offered to refill primidone today, however, patient states that she has enough medication at home.  PCP currently refilling lamotrigine and gabapentin. We have continued refills for primidone. We are happy to continue doing so, if needed, but have discussed her returning to PCP for continued refills. She has been stable on current treatment plan with no adjustments in medication doses. She is unable to tolerate increased doses of medications due to labile/low blood pressure readings and complaints of sedation/sleepiness. We have had her evaluated with [facility] neurology and neuropsychiatry. No additional treatment options available at this time. I have suggested that she discuss refills with PCP. If willing, she may continue follow up with PCP as advised. Otherwise, she will follow up with me annually. She and her husband are in agreement and verbalize appreciation.    No orders of the defined types were placed in this encounter.    No orders of the defined types were placed in this encounter.     I spent 30 minutes with the patient. 50% of this time was spent counseling and educating patient on plan of care and medications.     Shawnie Dapper, [name]-C 07/16/2020, 4:30 PM [facility] [address] [place], [place] [address] [phone]  Made any corrections needed, and agree with history, physical, neuro exam,assessment and plan as stated.     Naomie Dean, MD [facility]

## 2020-07-16 NOTE — Patient Instructions (Addendum)
  We will continue primidone 50mg  at breakfast and lunch and 75 at bedtme.   Please continue lamotrigine and gabapentin as prescribed by PCP. I would suggest PCP continue refills for your medications as you have been stable at current doses. We are not making any dose changes to your regimen.   Please follow up with as needed, annually if PCP prefers we fill primidone.      Tremor A tremor is trembling or shaking that you cannot control. Most tremors affect the hands or arms. Tremors can also affect the head, vocal cords, face, and other parts of the body. There are many types of tremors. Common types include:  Essential tremor. These usually occur in people older than 40. It may run in families and can happen in otherwise healthy people.  Resting tremor. These occur when the muscles are at rest, such as when your hands are resting in your lap. People with Parkinson's disease often have resting tremors.  Postural tremor. These occur when you try to hold a pose, such as keeping your hands outstretched.  Kinetic tremor. These occur during purposeful movement, such as trying to touch a finger to your nose.  Task-specific tremor. These may occur when you perform certain tasks such as writing, speaking, or standing.  Psychogenic tremor. These dramatically lessen or disappear when you are distracted. They can happen in people of all ages. Some types of tremors have no known cause. Tremors can also be a symptom of nervous system problems (neurological disorders) that may occur with aging. Some tremors go away with treatment, while others do not. Follow these instructions at home: Lifestyle      Limit alcohol intake to no more than 1 drink a day for nonpregnant women and 2 drinks a day for men. One drink equals 12 oz of beer, 5 oz of wine, or 1 oz of hard liquor.  Do not use any products that contain nicotine or tobacco, such as cigarettes and e-cigarettes. If you need help quitting, ask  your health care provider.  Avoid extreme heat and extreme cold.  Limit your caffeine intake, as told by your health care provider.  Try to get 8 hours of sleep each night.  Find ways to manage your stress, such as meditation or yoga. General instructions  Take over-the-counter and prescription medicines only as told by your health care provider.  Keep all follow-up visits as told by your health care provider. This is important. Contact a health care provider if you:  Develop a tremor after starting a new medicine.  Have a tremor along with other symptoms such as: ? Numbness. ? Tingling. ? Pain. ? Weakness.  Notice that your tremor gets worse.  Notice that your tremor interferes with your day-to-day life. Summary  A tremor is trembling or shaking that you cannot control.  Most tremors affect the hands or arms.  Some types of tremors have no known cause. Others may be a symptom of nervous system problems (neurological disorders).  Make sure you discuss any tremors you have with your health care provider. This information is not intended to replace advice given to you by your health care provider. Make sure you discuss any questions you have with your health care provider. Document Revised: 10/28/2017 Document Reviewed: 09/15/2017 Elsevier Patient Education  2020 09/17/2017.

## 2020-07-18 ENCOUNTER — Ambulatory Visit: Payer: Medicare Other | Admitting: Podiatry

## 2020-08-01 ENCOUNTER — Ambulatory Visit: Payer: Medicare Other | Admitting: Podiatry

## 2020-08-15 ENCOUNTER — Ambulatory Visit: Payer: Medicare Other | Admitting: Podiatry

## 2020-11-03 ENCOUNTER — Other Ambulatory Visit: Payer: Self-pay

## 2020-11-03 ENCOUNTER — Encounter (HOSPITAL_BASED_OUTPATIENT_CLINIC_OR_DEPARTMENT_OTHER): Payer: Managed Care, Other (non HMO) | Attending: Internal Medicine | Admitting: Internal Medicine

## 2020-11-03 DIAGNOSIS — E1042 Type 1 diabetes mellitus with diabetic polyneuropathy: Secondary | ICD-10-CM | POA: Diagnosis not present

## 2020-11-03 DIAGNOSIS — E10621 Type 1 diabetes mellitus with foot ulcer: Secondary | ICD-10-CM | POA: Diagnosis present

## 2020-11-03 DIAGNOSIS — Z794 Long term (current) use of insulin: Secondary | ICD-10-CM | POA: Diagnosis not present

## 2020-11-03 DIAGNOSIS — M14671 Charcot's joint, right ankle and foot: Secondary | ICD-10-CM | POA: Insufficient documentation

## 2020-11-03 DIAGNOSIS — Z9641 Presence of insulin pump (external) (internal): Secondary | ICD-10-CM | POA: Diagnosis not present

## 2020-11-03 DIAGNOSIS — L97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 severity: Secondary | ICD-10-CM | POA: Insufficient documentation

## 2020-11-04 NOTE — Progress Notes (Signed)
[name], [name] ([id number]) Visit Report for 11/03/2020 Abuse/Suicide Risk Screen Details Patient Name: Date of Service: [place], [name] 11/03/2020 9:00 A M Medical Record Number: [id number] Patient Account Number: [id number] Date of Birth/Sex: Treating RN: December 27, 1960 (59 y.o. Female) Yevonne Pax Primary Care Jocelyn Nold: Juluis Rainier Other Clinician: Referring Zanai Mallari: Treating Vasilisa Vore/Extender: Arelia Longest in Treatment: 0 Abuse/Suicide Risk Screen Items Answer ABUSE RISK SCREEN: Has anyone close to you tried to hurt or harm you recentlyo No Do you feel uncomfortable with anyone in your familyo No Has anyone forced you do things that you didnt want to doo No Electronic Signature(s) Signed: 11/04/2020 5:28:36 PM By: Yevonne Pax RN Entered By: Yevonne Pax on 11/03/2020 09:26:53 -------------------------------------------------------------------------------- Activities of Daily Living Details Patient Name: Date of Service: [name], [name] 11/03/2020 9:00 A M Medical Record Number: [id number] Patient Account Number: [id number] Date of Birth/Sex: Treating RN: 12-Jul-1961 (59 y.o. Female) Yevonne Pax Primary Care Suprina [place]: Juluis Rainier Other Clinician: Referring Shantelle Alles: Treating Hollan Philipp/Extender: Arelia Longest in Treatment: 0 Activities of Daily Living Items Answer Activities of Daily Living (Please select one for each item) Drive Automobile Not Able T Medications ake Completely Able Use T elephone Completely Able Care for Appearance Need Assistance Use T oilet Need Assistance Bath / Shower Need Assistance Dress Self Need Assistance Feed Self Completely Able Walk Not Able Get In / Out Bed Need Assistance Housework Not Able Prepare Meals Need Assistance Handle Money Need Assistance Shop for Self Need Assistance Electronic Signature(s) Signed: 11/04/2020 5:28:36 PM By: Yevonne Pax RN Entered By: Yevonne Pax  on 11/03/2020 09:27:45 -------------------------------------------------------------------------------- Education Screening Details Patient Name: Date of Service: [name] Sprinkles. 11/03/2020 9:00 A M Medical Record Number: [id number] Patient Account Number: [id number] Date of Birth/Sex: Treating RN: 08/25/1961 (59 y.o. Female) Yevonne Pax Primary Care Marcina Kinnison: Juluis Rainier Other Clinician: Referring Dareth Andrew: Treating Ryenn Howeth/Extender: Arelia Longest in Treatment: 0 Primary Learner Assessed: Patient Learning Preferences/Education Level/Primary Language Learning Preference: Explanation Highest Education Level: College or Above Preferred Language: English Cognitive Barrier Language Barrier: No Translator Needed: No Memory Deficit: No Emotional Barrier: No Cultural/Religious Beliefs Affecting Medical Care: No Physical Barrier Impaired Vision: Yes Glasses Impaired Hearing: No Decreased Hand dexterity: No Knowledge/Comprehension Knowledge Level: High Comprehension Level: High Ability to understand written instructions: High Ability to understand verbal instructions: High Motivation Anxiety Level: Anxious Cooperation: Cooperative Education Importance: Acknowledges Need Interest in Health Problems: Asks Questions Perception: Coherent Willingness to Engage in Self-Management High Activities: Readiness to Engage in Self-Management High Activities: Electronic Signature(s) Signed: 11/04/2020 5:28:36 PM By: Yevonne Pax RN Entered By: Yevonne Pax on 11/03/2020 09:28:17 -------------------------------------------------------------------------------- Fall Risk Assessment Details Patient Name: Date of Service: [name], [name] Rector C. 11/03/2020 9:00 A M Medical Record Number: [id number] Patient Account Number: [id number] Date of Birth/Sex: Treating RN: [birth year] (59 y.o. Female) Epps, Carrie Primary Care Wadie Mattie: Juluis Rainier Other  Clinician: Referring Lesha Jager: Treating Bernadette Gores/Extender: Arelia Longest in Treatment: 0 Fall Risk Assessment Items Have you had 2 or more falls in the last 12 monthso 0 Yes Have you had any fall that resulted in injury in the last 12 monthso 0 Yes FALLS RISK SCREEN History of falling - immediate or within 3 months 25 Yes Secondary diagnosis (Do you have 2 or more medical diagnoseso) 0 No Ambulatory aid None/bed rest/wheelchair/nurse 0 No Crutches/cane/walker 0 No Furniture 0 No Intravenous therapy Access/Saline/Heparin Lock 0 No Gait/Transferring Normal/ bed rest/ wheelchair 0 No Weak (short steps with  or without shuffle, stooped but able to lift head while walking, may seek 0 No support from furniture) Impaired (short steps with shuffle, may have difficulty arising from chair, head down, impaired 0 No balance) Mental Status Oriented to own ability 0 No Electronic Signature(s) Signed: 11/04/2020 5:28:36 PM By: Yevonne Pax RN Entered By: Yevonne Pax on 11/03/2020 09:29:25 -------------------------------------------------------------------------------- Foot Assessment Details Patient Name: Date of Service: [name] Sprinkles. 11/03/2020 9:00 A M Medical Record Number: [id number] Patient Account Number: [id number] Date of Birth/Sex: Treating RN: 09/14/1961 (59 y.o. Female) Yevonne Pax Primary Care Nadim Malia: Juluis Rainier Other Clinician: Referring Broxton Broady: Treating Elmo Rio/Extender: Arelia Longest in Treatment: 0 Foot Assessment Items Site Locations + = Sensation present, - = Sensation absent, C = Callus, U = Ulcer R = Redness, W = Warmth, M = Maceration, PU = Pre-ulcerative lesion F = Fissure, S = Swelling, D = Dryness Assessment Right: Left: Other Deformity: No No Prior Foot Ulcer: No No Prior Amputation: No No Charcot Joint: No No Ambulatory Status: Ambulatory With Help Assistance Device: Wheelchair Gait:  Surveyor, mining) Signed: 11/04/2020 5:28:36 PM By: Yevonne Pax RN Entered By: Yevonne Pax on 11/03/2020 09:37:49 -------------------------------------------------------------------------------- Nutrition Risk Screening Details Patient Name: Date of Service: [name], [name] 11/03/2020 9:00 A M Medical Record Number: [id number] Patient Account Number: [id number] Date of Birth/Sex: Treating RN: 1961-06-08 (59 y.o. Female) Epps, Carrie Primary Care Madysin Crisp: Juluis Rainier Other Clinician: Referring Sufian Ravi: Treating Lourine Alberico/Extender: Arelia Longest in Treatment: 0 Height (in): 64 Weight (lbs): 165 Body Mass Index (BMI): 28.3 Nutrition Risk Screening Items Score Screening NUTRITION RISK SCREEN: I have an illness or condition that made me change the kind and/or amount of food I eat 0 No I eat fewer than two meals per day 0 No I eat few fruits and vegetables, or milk products 0 No I have three or more drinks of beer, liquor or wine almost every day 0 No I have tooth or mouth problems that make it hard for me to eat 0 No I don't always have enough money to buy the food I need 0 No I eat alone most of the time 0 No I take three or more different prescribed or over-the-counter drugs a day 1 Yes Without wanting to, I have lost or gained 10 pounds in the last six months 0 No I am not always physically able to shop, cook and/or feed myself 2 Yes Nutrition Protocols Good Risk Protocol Moderate Risk Protocol 0 Provide education on nutrition High Risk Proctocol Risk Level: Moderate Risk Score: 3 Electronic Signature(s) Signed: 11/04/2020 5:28:36 PM By: Yevonne Pax RN Entered By: Yevonne Pax on 11/03/2020 09:29:55

## 2020-11-04 NOTE — Progress Notes (Signed)
[name], [name] ([id number]) Visit Report for 11/03/2020 Debridement Details Patient Name: Date of Service: [name], [name] 11/03/2020 9:00 A M Medical Record Number: [id number] Patient Account Number: [id number] Date of Birth/Sex: Treating RN: 06-13-1961 (59 y.o. Female) Zandra Abts Primary Care Provider: Juluis Rainier Other Clinician: Referring Provider: Treating Provider/Extender: Arelia Longest in Treatment: 0 Debridement Performed for Assessment: Wound #1 Right,Lateral Foot Performed By: Physician Maxwell Caul., MD Debridement Type: Debridement Severity of Tissue Pre Debridement: Fat layer exposed Level of Consciousness (Pre-procedure): Awake and Alert Pre-procedure Verification/Time Out Yes - 10:20 Taken: Start Time: 10:20 Pain Control: Other : Benzocaine 20% T Area Debrided (L x W): otal 1.2 (cm) x 1.7 (cm) = 2.04 (cm) Tissue and other material debrided: Viable, Non-Viable, Subcutaneous Level: Skin/Subcutaneous Tissue Debridement Description: Excisional Instrument: Blade Bleeding: Moderate Hemostasis Achieved: Silver Nitrate End Time: 10:21 Procedural Pain: 0 Post Procedural Pain: 0 Response to Treatment: Procedure was tolerated well Level of Consciousness (Post- Awake and Alert procedure): Post Debridement Measurements of Total Wound Length: (cm) 1.2 Width: (cm) 1.7 Depth: (cm) 0.1 Volume: (cm) 0.16 Character of Wound/Ulcer Post Debridement: Improved Severity of Tissue Post Debridement: Fat layer exposed Post Procedure Diagnosis Same as Pre-procedure Electronic Signature(s) Signed: 11/03/2020 12:42:35 PM By: Baltazar Najjar MD Signed: 11/03/2020 5:36:08 PM By: Zandra Abts RN, BSN Entered By: Baltazar Najjar on 11/03/2020 10:40:50 -------------------------------------------------------------------------------- HPI Details Patient Name: Date of Service: [name] Rudd C. 11/03/2020 9:00 A M Medical Record Number:  [id number] Patient Account Number: [id number] Date of Birth/Sex: Treating RN: 1961/08/13 (59 y.o. Female) Zandra Abts Primary Care Provider: Juluis Rainier Other Clinician: Referring Provider: Treating Provider/Extender: Arelia Longest in Treatment: 0 History of Present Illness HPI Description: ADMISSION 11/03/2020 this is a 59 year old woman with type 2 diabetes. She has severe peripheral neuropathy and autonomic neuropathy. 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 Her problem started at the beginning of July her husband noted a bleeding area in her sock. Unfortunately this became infected requiring admission to hospital from 7/12 through 06/13/2020. An MRI was negative for osteo she received IV antibiotics and was discharged on doxycycline. She did have standard arterial ABIs and TBI's but I did not see any waveforms. Fortunately the ABIs and TBI's were normal at 1.09 and 0.69 on the right and 1.01 and 1.02 on the left respectively. Her last visit with Dr. Allena Katz of podiatry was on 07/04/2020. At 1 which time the measurements were 1.1 x 0.8 x 0.3 she was using a dry dressing. She went on to see Dr. Grandville Silos at [facility] on 8/30 at which time the wound was debrided and they were prescribed Santyl. The wound is actually on the lateral foot over the base of the fifth metatarsal. She had an additional wound on her right heel but this closed over. They are currently soaking the foot in Epson salts and using Santyl and they have been doing this for quite a period of time. Past medical history includes type 1 diabetes managed with an insulin pump, movement disorder, hypothyroidism, obstructive sleep apnea. She has a cam boot and she wears this religiously spending most of her day currently in bed but she needs the boot on to get up to use her bedside commode ABIs were done during the hospitalization in July that  showed the numbers quoted above Electronic Signature(s) Signed: 11/03/2020 12:42:35 PM By: Baltazar Najjar MD Entered By: Baltazar Najjar on  11/03/2020 10:47:00 -------------------------------------------------------------------------------- Physical Exam Details Patient Name: Date of Service: [name], [name] 11/03/2020 9:00 A M Medical Record Number: [id number] Patient Account Number: [id number] Date of Birth/Sex: Treating RN: 1961-05-08 (59 y.o. Female) Zandra Abts Primary Care Provider: Juluis Rainier Other Clinician: Referring Provider: Treating Provider/Extender: Arelia Longest in Treatment: 0 Constitutional Sitting or standing Blood Pressure is within target range for patient.. Pulse regular and within target range for patient.Marland Kitchen Respirations regular, non-labored and within target range.. Temperature is normal and within the target range for the patient.Marland Kitchen Appears in no distress. Cardiovascular Pedal pulses are easily palpable including the dorsalis pedis and posterior tibial. Musculoskeletal Remarkably inverted at the ankle.. Integumentary (Hair, Skin) No erythema around the wound. Notes WoundWound exam; this the lateral foot at the base of the fifth metatarsal. Very significant hyper granulation perhaps 3 mm above the level of the surrounding skin. There is absolutely no debris on what appears to be a healthy granulated surface. I used a #10 scalpel to knock the hyper granulation back and then silver nitrate to cauterize. Hemostasis with silver nitrate and a pressure dressing. There is no palpable deep structures Electronic Signature(s) Signed: 11/03/2020 12:42:35 PM By: Baltazar Najjar MD Entered By: Baltazar Najjar on 11/03/2020 10:48:35 -------------------------------------------------------------------------------- Physician Orders Details Patient Name: Date of Service: [name]. 11/03/2020 9:00 A M Medical Record Number:  [id number] Patient Account Number: [id number] Date of Birth/Sex: Treating RN: 1961-03-19 (59 y.o. Female) Zandra Abts Primary Care Provider: Juluis Rainier Other Clinician: Referring Provider: Treating Provider/Extender: Arelia Longest in Treatment: 0 Verbal / Phone Orders: No Diagnosis Coding Follow-up Appointments Return Appointment in 1 week. Bathing/ Shower/ Hygiene May shower with protection but do not get wound dressing(s) wet. - May remove bandage and wash with soap and water on days that dressing is due to be changed. Off-Loading Removable cast walker boot to: - right foot Other: - Minimal weight bearing on right foot Wound Treatment Wound #1 - Foot Wound Laterality: Right, Lateral Cleanser: Normal Saline (DME) (Generic) Every Other Day/15 Days Discharge Instructions: Cleanse the wound with Normal Saline prior to applying a clean dressing using gauze sponges, not tissue or cotton balls. Prim Dressing: Hydrofera Blue Ready Foam, 2.5 x2.5 in (DME) (Generic) Every Other Day/15 Days ary Discharge Instructions: Apply to wound bed as instructed Secondary Dressing: Woven Gauze Sponge, Non-Sterile 4x4 in (DME) (Generic) Every Other Day/15 Days Discharge Instructions: Apply over primary dressing as directed. Secured With: [employer], 4.5x3.1 (in/yd) (DME) (Generic) Every Other Day/15 Days Discharge Instructions: Secure with Kerlix as directed. Secured With: Paper Tape, 2x10 (in/yd) (DME) (Generic) Every Other Day/15 Days Discharge Instructions: Secure dressing with tape as directed. Electronic Signature(s) Signed: 11/03/2020 12:42:35 PM By: Baltazar Najjar MD Signed: 11/03/2020 5:36:08 PM By: Zandra Abts RN, BSN Entered By: Zandra Abts on 11/03/2020 10:28:11 -------------------------------------------------------------------------------- Problem List Details Patient Name: Date of Service: [name] Rudd C. 11/03/2020 9:00 A M Medical  Record Number: [id number] Patient Account Number: [id number] Date of Birth/Sex: Treating RN: [birth year] (59 y.o. Female) Zandra Abts Primary Care Provider: Juluis Rainier Other Clinician: Referring Provider: Treating Provider/Extender: Arelia Longest in Treatment: 0 Active Problems ICD-10 Encounter Code Description Active Date MDM Diagnosis E10.621 Type 1 diabetes mellitus with foot ulcer 11/03/2020 No Yes L97.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 11/03/2020 No Yes severity E10.42 Type 1 diabetes mellitus with diabetic polyneuropathy 11/03/2020 No Yes M14.671 Charcot's joint, right ankle and  foot 11/03/2020 No Yes Inactive Problems Resolved Problems Electronic Signature(s) Signed: 11/03/2020 12:42:35 PM By: Baltazar Najjar MD Entered By: Baltazar Najjar on 11/03/2020 10:33:19 -------------------------------------------------------------------------------- Progress Note Details Patient Name: Date of Service: [name]. 11/03/2020 9:00 A M Medical Record Number: [id number] Patient Account Number: [id number] Date of Birth/Sex: Treating RN: June 13, 1961 (59 y.o. Female) Zandra Abts Primary Care Provider: Juluis Rainier Other Clinician: Referring Provider: Treating Provider/Extender: Arelia Longest in Treatment: 0 Subjective History of Present Illness (HPI) ADMISSION 11/03/2020 this is a 59 year old woman with type 2 diabetes. She has severe peripheral neuropathy and autonomic neuropathy. 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 Her problem started at the beginning of July her husband noted a bleeding area in her sock. Unfortunately this became infected requiring admission to hospital from 7/12 through 06/13/2020. An MRI was negative for osteo she received IV antibiotics and was discharged on doxycycline. She did have standard  arterial ABIs and TBI's but I did not see any waveforms. Fortunately the ABIs and TBI's were normal at 1.09 and 0.69 on the right and 1.01 and 1.02 on the left respectively. Her last visit with Dr. Allena Katz of podiatry was on 07/04/2020. At 1 which time the measurements were 1.1 x 0.8 x 0.3 she was using a dry dressing. She went on to see Dr. Grandville Silos at [facility] on 8/30 at which time the wound was debrided and they were prescribed Santyl. The wound is actually on the lateral foot over the base of the fifth metatarsal. She had an additional wound on her right heel but this closed over. They are currently soaking the foot in Epson salts and using Santyl and they have been doing this for quite a period of time. Past medical history includes type 1 diabetes managed with an insulin pump, movement disorder, hypothyroidism, obstructive sleep apnea. She has a cam boot and she wears this religiously spending most of her day currently in bed but she needs the boot on to get up to use her bedside commode ABIs were done during the hospitalization in July that showed the numbers quoted above Patient History Information obtained from Patient. Allergies codeine, penicillin Family History Cancer - Father, Diabetes - Father,Maternal Grandparents, Hypertension - Mother, Lung Disease - Father, Thyroid Problems - Mother, No family history of Heart Disease, Hereditary Spherocytosis, Kidney Disease, Seizures, Stroke, Tuberculosis. Social History Never smoker, Marital Status - Married, Alcohol Use - Never, Drug Use - No History, Caffeine Use - Rarely. Medical History Eyes Denies history of Cataracts, Glaucoma, Optic Neuritis Ear/Nose/Mouth/Throat Denies history of Chronic sinus problems/congestion, Middle ear problems Hematologic/Lymphatic Denies history of Anemia, Hemophilia, Human Immunodeficiency Virus, Lymphedema, Sickle Cell Disease Respiratory Patient has history of Asthma, Sleep Apnea Denies  history of Aspiration, Chronic Obstructive Pulmonary Disease (COPD), Pneumothorax, Tuberculosis Cardiovascular Patient has history of Peripheral Venous Disease Denies history of Angina, Arrhythmia, Congestive Heart Failure, Coronary Artery Disease, Deep Vein Thrombosis, Hypertension, Hypotension, Myocardial Infarction, Peripheral Arterial Disease, Phlebitis, Vasculitis Gastrointestinal Denies history of Cirrhosis , Colitis, Crohnoos, Hepatitis A, Hepatitis B, Hepatitis C Endocrine Patient has history of Type I Diabetes Denies history of Type II Diabetes Genitourinary Denies history of End Stage Renal Disease Immunological Denies history of Lupus Erythematosus, Raynaudoos, Scleroderma Integumentary (Skin) Denies history of History of Burn Musculoskeletal Denies history of Gout, Rheumatoid Arthritis, Osteoarthritis, Osteomyelitis Neurologic Patient has history of Neuropathy Denies history of Dementia, Quadriplegia, Paraplegia, Seizure Disorder Oncologic Denies history of  Received Chemotherapy, Received Radiation Psychiatric Denies history of Anorexia/bulimia, Confinement Anxiety Patient is treated with Insulin. Blood sugar is not tested. Review of Systems (ROS) Constitutional Symptoms (General Health) Denies complaints or symptoms of Fatigue, Fever, Chills, Marked Weight Change. Eyes Complains or has symptoms of Glasses / Contacts. Denies complaints or symptoms of Dry Eyes, Vision Changes. Ear/Nose/Mouth/Throat Denies complaints or symptoms of Chronic sinus problems or rhinitis. Respiratory Denies complaints or symptoms of Chronic or frequent coughs, Shortness of Breath. Cardiovascular Denies complaints or symptoms of Chest pain. Gastrointestinal Denies complaints or symptoms of Frequent diarrhea, Nausea, Vomiting. Endocrine Denies complaints or symptoms of Heat/cold intolerance. Genitourinary Denies complaints or symptoms of Frequent urination. Integumentary  (Skin) Complains or has symptoms of Wounds. Musculoskeletal Denies complaints or symptoms of Muscle Pain, Muscle Weakness. Neurologic Denies complaints or symptoms of Numbness/parasthesias. Psychiatric Denies complaints or symptoms of Claustrophobia, Suicidal. Objective Constitutional Sitting or standing Blood Pressure is within target range for patient.. Pulse regular and within target range for patient.Marland Kitchen. Respirations regular, non-labored and within target range.. Temperature is normal and within the target range for the patient.Marland Kitchen. Appears in no distress. Vitals Time Taken: 9:19 AM, Height: 64 in, Source: Stated, Weight: 165 lbs, BMI: 28.3, Temperature: 98.4 F, Pulse: 76 bpm, Respiratory Rate: 18 breaths/min, Blood Pressure: 121/64 mmHg. Cardiovascular Pedal pulses are easily palpable including the dorsalis pedis and posterior tibial. Musculoskeletal Remarkably inverted at the ankle.. General Notes: WoundWound exam; this the lateral foot at the base of the fifth metatarsal. Very significant hyper granulation perhaps 3 mm above the level of the surrounding skin. There is absolutely no debris on what appears to be a healthy granulated surface. I used a #10 scalpel to knock the hyper granulation back and then silver nitrate to cauterize. Hemostasis with silver nitrate and a pressure dressing. There is no palpable deep structures Integumentary (Hair, Skin) No erythema around the wound. Wound #1 status is Open. Original cause of wound was Trauma. The wound is located on the Right,Lateral Foot. The wound measures 1.2cm length x 1.7cm width x 0.1cm depth; 1.602cm^2 area and 0.16cm^3 volume. There is Fat Layer (Subcutaneous Tissue) exposed. There is no tunneling or undermining noted. There is a medium amount of serosanguineous drainage noted. The wound margin is distinct with the outline attached to the wound base. There is large (67- 100%) red, hyper - granulation within the wound bed. There is  no necrotic tissue within the wound bed. Assessment Active Problems ICD-10 Type 1 diabetes mellitus with foot ulcer Non-pressure chronic ulcer of other part of right foot with other specified severity Type 1 diabetes mellitus with diabetic polyneuropathy Charcot's joint, right ankle and foot Procedures Wound #1 Pre-procedure diagnosis of Wound #1 is a Diabetic Wound/Ulcer of the Lower Extremity located on the Right,Lateral Foot .Severity of Tissue Pre Debridement is: Fat layer exposed. There was a Excisional Skin/Subcutaneous Tissue Debridement with a total area of 2.04 sq cm performed by Maxwell Caulobson, Shawnte Winton G., MD. With the following instrument(s): Blade to remove Viable and Non-Viable tissue/material. Material removed includes Subcutaneous Tissue after achieving pain control using Other (Benzocaine 20%). No specimens were taken. A time out was conducted at 10:20, prior to the start of the procedure. A Moderate amount of bleeding was controlled with Silver Nitrate. The procedure was tolerated well with a pain level of 0 throughout and a pain level of 0 following the procedure. Post Debridement Measurements: 1.2cm length x 1.7cm width x 0.1cm depth; 0.16cm^3 volume. Character of Wound/Ulcer Post Debridement is improved. Severity of  Tissue Post Debridement is: Fat layer exposed. Post procedure Diagnosis Wound #1: Same as Pre-Procedure Plan Follow-up Appointments: Return Appointment in 1 week. Bathing/ Shower/ Hygiene: May shower with protection but do not get wound dressing(s) wet. - May remove bandage and wash with soap and water on days that dressing is due to be changed. Off-Loading: Removable cast walker boot to: - right foot Other: - Minimal weight bearing on right foot WOUND #1: - Foot Wound Laterality: Right, Lateral Cleanser: Normal Saline (DME) (Generic) Every Other Day/15 Days Discharge Instructions: Cleanse the wound with Normal Saline prior to applying a clean dressing using gauze  sponges, not tissue or cotton balls. Prim Dressing: Hydrofera Blue Ready Foam, 2.5 x2.5 in (DME) (Generic) Every Other Day/15 Days ary Discharge Instructions: Apply to wound bed as instructed Secondary Dressing: Woven Gauze Sponge, Non-Sterile 4x4 in (DME) (Generic) Every Other Day/15 Days Discharge Instructions: Apply over primary dressing as directed. Secured With: [employer], 4.5x3.1 (in/yd) (DME) (Generic) Every Other Day/15 Days Discharge Instructions: Secure with Kerlix as directed. Secured With: Paper T ape, 2x10 (in/yd) (DME) (Generic) Every Other Day/15 Days Discharge Instructions: Secure dressing with tape as directed. 1. Right lateral foot wound. Unfortunately this is a weightbearing surface for her although she does use her padded cam boot. She is also fairly strict about not weightbearing excessively and this she says she spends most of her time in bed and only uses the foot when she is transferring to her bedside commode 2. The wound was infected in July although she had an MRI that was negative for osteo-. Her husband was able to show me pictures of this certainly did look infected at the time marked erythema. 3. Fortunately she has no arterial issues based on the noninvasive test she had done during her hospitalization although I did not see a waveform analysis her ABIs and TBI's were normal. 4. She has a new brace for the right ankle but she is not using it out of a fear of traumatizing this area. 5. We have discontinued the Epson salt soaks and Santyl. We are going to use Hydrofera Blue change this every second day. I did not see any good reason for additional cultures or x-rays. No additional antibiotics. I spent 35 minutes in review of this patient's past medical history, face-to-face evaluation and preparation of this record Electronic Signature(s) Signed: 11/03/2020 12:42:35 PM By: Baltazar Najjar MD Entered By: Baltazar Najjar on 11/03/2020  10:51:03 -------------------------------------------------------------------------------- HxROS Details Patient Name: Date of Service: [name] Rudd C. 11/03/2020 9:00 A M Medical Record Number: [id number] Patient Account Number: [id number] Date of Birth/Sex: Treating RN: [birth year] (59 y.o. Female) Yevonne Pax Primary Care Provider: Juluis Rainier Other Clinician: Referring Provider: Treating Provider/Extender: Arelia Longest in Treatment: 0 Information Obtained From Patient Constitutional Symptoms (General Health) Complaints and Symptoms: Negative for: Fatigue; Fever; Chills; Marked Weight Change Eyes Complaints and Symptoms: Positive for: Glasses / Contacts Negative for: Dry Eyes; Vision Changes Medical History: Negative for: Cataracts; Glaucoma; Optic Neuritis Ear/Nose/Mouth/Throat Complaints and Symptoms: Negative for: Chronic sinus problems or rhinitis Medical History: Negative for: Chronic sinus problems/congestion; Middle ear problems Respiratory Complaints and Symptoms: Negative for: Chronic or frequent coughs; Shortness of Breath Medical History: Positive for: Asthma; Sleep Apnea Negative for: Aspiration; Chronic Obstructive Pulmonary Disease (COPD); Pneumothorax; Tuberculosis Cardiovascular Complaints and Symptoms: Negative for: Chest pain Medical History: Positive for: Peripheral Venous Disease Negative for: Angina; Arrhythmia; Congestive Heart Failure; Coronary Artery Disease; Deep Vein Thrombosis; Hypertension; Hypotension; Myocardial Infarction;  Peripheral Arterial Disease; Phlebitis; Vasculitis Gastrointestinal Complaints and Symptoms: Negative for: Frequent diarrhea; Nausea; Vomiting Medical History: Negative for: Cirrhosis ; Colitis; Crohns; Hepatitis A; Hepatitis B; Hepatitis C Endocrine Complaints and Symptoms: Negative for: Heat/cold intolerance Medical History: Positive for: Type I Diabetes Negative for: Type II  Diabetes Time with diabetes: 30 Treated with: Insulin Blood sugar tested every day: No Genitourinary Complaints and Symptoms: Negative for: Frequent urination Medical History: Negative for: End Stage Renal Disease Integumentary (Skin) Complaints and Symptoms: Positive for: Wounds Medical History: Negative for: History of Burn Musculoskeletal Complaints and Symptoms: Negative for: Muscle Pain; Muscle Weakness Medical History: Negative for: Gout; Rheumatoid Arthritis; Osteoarthritis; Osteomyelitis Neurologic Complaints and Symptoms: Negative for: Numbness/parasthesias Medical History: Positive for: Neuropathy Negative for: Dementia; Quadriplegia; Paraplegia; Seizure Disorder Psychiatric Complaints and Symptoms: Negative for: Claustrophobia; Suicidal Medical History: Negative for: Anorexia/bulimia; Confinement Anxiety Hematologic/Lymphatic Medical History: Negative for: Anemia; Hemophilia; Human Immunodeficiency Virus; Lymphedema; Sickle Cell Disease Immunological Medical History: Negative for: Lupus Erythematosus; Raynauds; Scleroderma Oncologic Medical History: Negative for: Received Chemotherapy; Received Radiation Immunizations Pneumococcal Vaccine: Received Pneumococcal Vaccination: No Implantable Devices None Family and Social History Cancer: Yes - Father; Diabetes: Yes - Father,Maternal Grandparents; Heart Disease: No; Hereditary Spherocytosis: No; Hypertension: Yes - Mother; Kidney Disease: No; Lung Disease: Yes - Father; Seizures: No; Stroke: No; Thyroid Problems: Yes - Mother; Tuberculosis: No; Never smoker; Marital Status - Married; Alcohol Use: Never; Drug Use: No History; Caffeine Use: Rarely; Financial Concerns: No; Food, Clothing or Shelter Needs: No; Support System Lacking: No; Transportation Concerns: No Electronic Signature(s) Signed: 11/03/2020 12:42:35 PM By: Baltazar Najjar MD Signed: 11/04/2020 5:28:36 PM By: Yevonne Pax RN Entered By: Yevonne Pax on 11/03/2020 09:26:39 -------------------------------------------------------------------------------- SuperBill Details Patient Name: Date of Service: [name] 11/03/2020 Medical Record Number: [id number] Patient Account Number: [id number] Date of Birth/Sex: Treating RN: 1961-01-30 (59 y.o. Female) Zandra Abts Primary Care Provider: Juluis Rainier Other Clinician: Referring Provider: Treating Provider/Extender: Arelia Longest in Treatment: 0 Diagnosis Coding ICD-10 Codes Code Description [phone] Type 1 diabetes mellitus with foot ulcer L97.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 severity E10.42 Type 1 diabetes mellitus with diabetic polyneuropathy M14.671 Charcot's joint, right ankle and foot Facility Procedures CPT4 Code: [id number] Description: 99213 - WOUND CARE VISIT-LEV 3 EST PT Modifier: 25 Quantity: 1 CPT4 Code: [id number] Description: 11042 - DEB SUBQ TISSUE 20 SQ CM/< ICD-10 Diagnosis Description L97.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 sever Modifier: ity Quantity: 1 Physician Procedures : CPT4 Code Description Modifier [id number] WC PHYS LEVEL 3 NEW PT 25 ICD-10 Diagnosis Description E10.621 Type 1 diabetes mellitus with foot ulcer 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

## 2020-11-04 NOTE — Progress Notes (Signed)
[name] ([id number]) Visit Report for 11/03/2020 Allergy List Details Patient Name: Date of Service: [name], [name] [name] 11/03/2020 9:00 A M Medical Record Number: [id number] Patient Account Number: [id number] Date of Birth/Sex: Treating RN: [birth year] (59 y.o. Female) Yevonne Pax Primary Care Broderic Bara: Juluis Rainier Other Clinician: Referring Karyss Frese: Treating Janelle Culton/Extender: Arelia Longest in Treatment: 0 Allergies Active Allergies codeine penicillin Allergy Notes Electronic Signature(s) Signed: 11/04/2020 5:28:36 PM By: Yevonne Pax RN Entered By: Yevonne Pax on 11/03/2020 09:20:18 -------------------------------------------------------------------------------- Arrival Information Details Patient Name: Date of Service: [name]. 11/03/2020 9:00 A M Medical Record Number: [id number] Patient Account Number: [id number] Date of Birth/Sex: Treating RN: 1961/10/31 (59 y.o. Female) Yevonne Pax Primary Care Everest Brod: Juluis Rainier Other Clinician: Referring Zaydon Kinser: Treating Larue Lightner/Extender: Arelia Longest in Treatment: 0 Visit Information Patient Arrived: Wheel Chair Arrival Time: 09:17 Accompanied By: self Transfer Assistance: Manual Patient Identification Verified: Yes Secondary Verification Process Completed: Yes Patient Requires Transmission-Based Precautions: No Patient Has Alerts: Yes Patient Alerts: R ABI 1.09 TBI .69 L ABI 1.02 TBI .77 Electronic Signature(s) Signed: 11/04/2020 5:28:36 PM By: Yevonne Pax RN Entered By: Yevonne Pax on 11/03/2020 09:40:01 -------------------------------------------------------------------------------- Clinic Level of Care Assessment Details Patient Name: Date of Service: [name], [name] 11/03/2020 9:00 A M Medical Record Number: [id number] Patient Account Number: [id number] Date of Birth/Sex: Treating RN: Jul 14, 1961 (59 y.o. Female) Zandra Abts Primary Care  Chamille Werntz: Juluis Rainier Other Clinician: Referring Dymon Summerhill: Treating Emogene Muratalla/Extender: Arelia Longest in Treatment: 0 Clinic Level of Care Assessment Items TOOL 1 Quantity Score X- 1 0 Use when EandM and Procedure is performed on INITIAL visit ASSESSMENTS - Nursing Assessment / Reassessment X- 1 20 General Physical Exam (combine w/ comprehensive assessment (listed just below) when performed on new pt. evals) X- 1 25 Comprehensive Assessment (HX, ROS, Risk Assessments, Wounds Hx, etc.) ASSESSMENTS - Wound and Skin Assessment / Reassessment []  - 0 Dermatologic / Skin Assessment (not related to wound area) ASSESSMENTS - Ostomy and/or Continence Assessment and Care []  - 0 Incontinence Assessment and Management []  - 0 Ostomy Care Assessment and Management (repouching, etc.) PROCESS - Coordination of Care X - Simple Patient / Family Education for ongoing care 1 15 []  - 0 Complex (extensive) Patient / Family Education for ongoing care X- 1 10 Staff obtains , Records, T Results / Process Orders est []  - 0 Staff telephones HHA, Nursing Homes / Clarify orders / etc []  - 0 Routine Transfer to another Facility (non-emergent condition) []  - 0 Routine Hospital Admission (non-emergent condition) X- 1 15 New Admissions / / Ordering NPWT Apligraf, etc. , []  - 0 Emergency Hospital Admission (emergent condition) PROCESS - Special Needs []  - 0 Pediatric / Minor Patient Management []  - 0 Isolation Patient Management []  - 0 Hearing / Language / Visual special needs []  - 0 Assessment of Community assistance (transportation, D/C planning, etc.) []  - 0 Additional assistance / Altered mentation []  - 0 Support Surface(s) Assessment (bed, cushion, seat, etc.) INTERVENTIONS - Miscellaneous []  - 0 External ear exam []  - 0 Patient Transfer (multiple staff / / Similar devices) []  - 0 Simple Staple / Suture removal  (25 or less) []  - 0 Complex Staple / Suture removal (26 or more) []  - 0 Hypo/Hyperglycemic Management (do not check if billed separately) []  - 0 Ankle / Brachial Index (ABI) - do not check if billed separately Has the patient been seen at the hospital within the  last three years: Yes Total Score: 85 Level Of Care: New/Established - Level 3 Electronic Signature(s) Signed: 11/03/2020 5:36:08 PM By: Zandra Abts RN, BSN Signed: 11/03/2020 5:36:08 PM By: Zandra Abts RN, BSN Entered By: Zandra Abts on 11/03/2020 10:28:59 -------------------------------------------------------------------------------- Lower Extremity Assessment Details Patient Name: Date of Service: [name] Rudd C. 11/03/2020 9:00 A M Medical Record Number: [id number] Patient Account Number: [id number] Date of Birth/Sex: Treating RN: [birth year] (59 y.o. Female) Yevonne Pax Primary Care Tarynn Garling: Juluis Rainier Other Clinician: Referring Yaphet Smethurst: Treating Cataldo Cosgriff/Extender: Arelia Longest in Treatment: 0 Edema Assessment Assessed: [Left: No] [Right: No] E[Left: dema] [Right: :] Calf Left: Right: Point of Measurement: 28 cm From Medial Instep 30 cm Ankle Left: Right: Point of Measurement: 10 cm From Medial Instep 23 cm Electronic Signature(s) Signed: 11/04/2020 5:28:36 PM By: Yevonne Pax RN Entered By: Yevonne Pax on 11/03/2020 09:38:47 -------------------------------------------------------------------------------- Multi Wound Chart Details Patient Name: Date of Service: [name]. 11/03/2020 9:00 A M Medical Record Number: [id number] Patient Account Number: [id number] Date of Birth/Sex: Treating RN: 21-Jan-1961 (59 y.o. Female) Zandra Abts Primary Care Ramata Strothman: Juluis Rainier Other Clinician: Referring Rhyan Radler: Treating Maribel Luis/Extender: Arelia Longest in Treatment: 0 Vital Signs Height(in): 64 Pulse(bpm): 76 Weight(lbs):  165 Blood Pressure(mmHg): 121/64 Body Mass Index(BMI): 28 Temperature(F): 98.4 Respiratory Rate(breaths/min): 18 Photos: [1:No Photos Right, Lateral Foot] [N/A:N/A N/A] Wound Location: [1:Trauma] [N/A:N/A] Wounding Event: [1:Diabetic Wound/Ulcer of the Lower] [N/A:N/A] Primary Etiology: [1:Extremity Asthma, Sleep Apnea, Peripheral] [N/A:N/A] Comorbid History: [1:Venous Disease, Type I Diabetes, Neuropathy 06/03/2020] [N/A:N/A] Date Acquired: [1:0] [N/A:N/A] Weeks of Treatment: [1:Open] [N/A:N/A] Wound Status: [1:1.2x1.7x0.1] [N/A:N/A] Measurements L x W x D (cm) [1:1.602] [N/A:N/A] A (cm) : rea [1:0.16] [N/A:N/A] Volume (cm) : [1:0.00%] [N/A:N/A] % Reduction in A rea: [1:0.00%] [N/A:N/A] % Reduction in Volume: [1:Grade 2] [N/A:N/A] Classification: [1:Medium] [N/A:N/A] Exudate A mount: [1:Serosanguineous] [N/A:N/A] Exudate Type: [1:red, brown] [N/A:N/A] Exudate Color: [1:Distinct, outline attached] [N/A:N/A] Wound Margin: [1:Large (67-100%)] [N/A:N/A] Granulation A mount: [1:Red, Hyper-granulation] [N/A:N/A] Granulation Quality: [1:None Present (0%)] [N/A:N/A] Necrotic A mount: [1:Fat Layer (Subcutaneous Tissue): Yes N/A] Exposed Structures: [1:Fascia: No Tendon: No Muscle: No Joint: No Bone: No None] [N/A:N/A] Epithelialization: [1:Debridement - Excisional] [N/A:N/A] Debridement: Pre-procedure Verification/Time Out 10:20 [N/A:N/A] Taken: [1:Other] [N/A:N/A] Pain Control: [1:Subcutaneous] [N/A:N/A] Tissue Debrided: [1:Skin/Subcutaneous Tissue] [N/A:N/A] Level: [1:2.04] [N/A:N/A] Debridement A (sq cm): [1:rea Blade] [N/A:N/A] Instrument: [1:Moderate] [N/A:N/A] Bleeding: [1:Silver Nitrate] [N/A:N/A] Hemostasis A chieved: [1:0] [N/A:N/A] Procedural Pain: [1:0] [N/A:N/A] Post Procedural Pain: [1:Procedure was tolerated well] [N/A:N/A] Debridement Treatment Response: [1:1.2x1.7x0.1] [N/A:N/A] Post Debridement Measurements L x W x D (cm) [1:0.16] [N/A:N/A] Post Debridement  Volume: (cm) [1:Debridement] [N/A:N/A] Treatment Notes Electronic Signature(s) Signed: 11/03/2020 12:42:35 PM By: Baltazar Najjar MD Signed: 11/03/2020 5:36:08 PM By: Zandra Abts RN, BSN Entered By: Baltazar Najjar on 11/03/2020 10:40:37 -------------------------------------------------------------------------------- Multi-Disciplinary Care Plan Details Patient Name: Date of Service: [name], [place]. 11/03/2020 9:00 A M Medical Record Number: [id number] Patient Account Number: [id number] Date of Birth/Sex: Treating RN: 12-18-1960 (59 y.o. Female) Zandra Abts Primary Care Kyrin Garn: Juluis Rainier Other Clinician: Referring Minda Faas: Treating Cova Knieriem/Extender: Arelia Longest in Treatment: 0 Active Inactive Abuse / Safety / Falls / Self Care Management Nursing Diagnoses: Potential for falls Potential for injury related to falls Goals: Patient will not experience any injury related to falls Date Initiated: 11/03/2020 Target Resolution Date: 12/05/2020 Goal Status: Active Patient/caregiver will verbalize/demonstrate measures taken to prevent injury and/or falls Date Initiated: 11/03/2020 Target Resolution Date: 12/05/2020 Goal Status: Active Interventions: Assess  Activities of Daily Living upon admission and as needed Assess fall risk on admission and as needed Assess: immobility, friction, shearing, incontinence upon admission and as needed Assess impairment of mobility on admission and as needed per policy Assess personal safety and home safety (as indicated) on admission and as needed Assess self care needs on admission and as needed Provide education on fall prevention Provide education on personal and home safety Notes: Nutrition Nursing Diagnoses: Impaired glucose control: actual or potential Potential for alteratiion in Nutrition/Potential for imbalanced nutrition Goals: Patient/caregiver agrees to and verbalizes understanding of need to use  nutritional supplements and/or vitamins as prescribed Date Initiated: 11/03/2020 Target Resolution Date: 12/05/2020 Goal Status: Active Patient/caregiver will maintain therapeutic glucose control Date Initiated: 11/03/2020 Target Resolution Date: 12/05/2020 Goal Status: Active Interventions: Assess HgA1c results as ordered upon admission and as needed Assess patient nutrition upon admission and as needed per policy Provide education on elevated blood sugars and impact on wound healing Provide education on nutrition Notes: Wound/Skin Impairment Nursing Diagnoses: Impaired tissue integrity Knowledge deficit related to ulceration/compromised skin integrity Goals: Patient/caregiver will verbalize understanding of skin care regimen Date Initiated: 11/03/2020 Target Resolution Date: 12/05/2020 Goal Status: Active Ulcer/skin breakdown will have a volume reduction of 30% by week 4 Date Initiated: 11/03/2020 Target Resolution Date: 12/05/2020 Goal Status: Active Interventions: Assess patient/caregiver ability to obtain necessary supplies Assess patient/caregiver ability to perform ulcer/skin care regimen upon admission and as needed Assess ulceration(s) every visit Provide education on ulcer and skin care Notes: Electronic Signature(s) Signed: 11/03/2020 5:36:08 PM By: Zandra Abts RN, BSN Entered By: Zandra Abts on 11/03/2020 10:18:03 -------------------------------------------------------------------------------- Pain Assessment Details Patient Name: Date of Service: [name]. 11/03/2020 9:00 A M Medical Record Number: [id number] Patient Account Number: [id number] Date of Birth/Sex: Treating RN: 04/23/1961 (60 y.o. Female) Yevonne Pax Primary Care Jakyron Fabro: Juluis Rainier Other Clinician: Referring Anderia Lorenzo: Treating Lucah Petta/Extender: Arelia Longest in Treatment: 0 Active Problems Location of Pain Severity and Description of Pain Patient Has  Paino No Site Locations Pain Management and Medication Current Pain Management: Electronic Signature(s) Signed: 11/04/2020 5:28:36 PM By: Yevonne Pax RN Entered By: Yevonne Pax on 11/03/2020 09:42:40 -------------------------------------------------------------------------------- Patient/Caregiver Education Details Patient Name: Date of Service: [name] 12/6/2021andnbsp9:00 A M Medical Record Number: [id number] Patient Account Number: [id number] Date of Birth/Gender: Treating RN: 05-Apr-1961 (59 y.o. Female) Zandra Abts Primary Care Physician: Juluis Rainier Other Clinician: Referring Physician: Treating Physician/Extender: Arelia Longest in Treatment: 0 Education Assessment Education Provided To: Patient Education Topics Provided Elevated Blood Sugar/ Impact on Healing: Methods: Explain/Verbal Responses: State content correctly Nutrition: Methods: Explain/Verbal Responses: State content correctly Safety: Methods: Explain/Verbal Responses: State content correctly Wound/Skin Impairment: Methods: Explain/Verbal Responses: State content correctly Electronic Signature(s) Signed: 11/03/2020 5:36:08 PM By: Zandra Abts RN, BSN Entered By: Zandra Abts on 11/03/2020 10:18:18 -------------------------------------------------------------------------------- Wound Assessment Details Patient Name: Date of Service: [name]. 11/03/2020 9:00 A M Medical Record Number: [id number] Patient Account Number: [id number] Date of Birth/Sex: Treating RN: 1961/06/14 (59 y.o. Female) Yevonne Pax Primary Care Arland Usery: Juluis Rainier Other Clinician: Referring Tallia Moehring: Treating Saidee Geremia/Extender: Arelia Longest in Treatment: 0 Wound Status Wound Number: 1 Primary Diabetic Wound/Ulcer of the Lower Extremity Etiology: Wound Location: Right, Lateral Foot Wound Open Wounding Event: Trauma Status: Date Acquired:  06/03/2020 Comorbid Asthma, Sleep Apnea, Peripheral Venous Disease, Type I Weeks Of Treatment: 0 History: Diabetes, Neuropathy Clustered Wound: No Wound Measurements Length: (cm) 1.2 Width: (cm) 1.7 Depth: (cm)  0.1 Area: (cm) 1.602 Volume: (cm) 0.16 % Reduction in Area: 0% % Reduction in Volume: 0% Epithelialization: None Tunneling: No Undermining: No Wound Description Classification: Grade 2 Wound Margin: Distinct, outline attached Exudate Amount: Medium Exudate Type: Serosanguineous Exudate Color: red, brown Foul Odor After Cleansing: No Slough/Fibrino No Wound Bed Granulation Amount: Large (67-100%) Exposed Structure Granulation Quality: Red, Hyper-granulation Fascia Exposed: No Necrotic Amount: None Present (0%) Fat Layer (Subcutaneous Tissue) Exposed: Yes Tendon Exposed: No Muscle Exposed: No Joint Exposed: No Bone Exposed: No Electronic Signature(s) Signed: 11/03/2020 5:36:08 PM By: Zandra AbtsLynch, [name] RN, BSN Signed: 11/04/2020 5:28:36 PM By: Yevonne PaxEpps, Carrie RN Entered By: Zandra AbtsLynch, [name] on 11/03/2020 10:22:30 -------------------------------------------------------------------------------- Vitals Details Patient Name: Date of Service: [name], [name] RectorMA RY C. 11/03/2020 9:00 A M Medical Record Number: [id number] Patient Account Number: [id number] Date of Birth/Sex: Treating RN: 11/20/1961 (59 y.o. Female) Epps, Carrie Primary Care Evin Loiseau: Juluis RainierBarnes, Elizabeth Other Clinician: Referring Colbie Danner: Treating Lilyian Quayle/Extender: Arelia Longestobson, Michael Barnes, Elizabeth Weeks in Treatment: 0 Vital Signs Time Taken: 09:19 Temperature (F): 98.4 Height (in): 64 Pulse (bpm): 76 Source: Stated Respiratory Rate (breaths/min): 18 Weight (lbs): 165 Blood Pressure (mmHg): 121/64 Body Mass Index (BMI): 28.3 Reference Range: 80 - 120 mg / dl Electronic Signature(s) Signed: 11/04/2020 5:28:36 PM By: Yevonne PaxEpps, Carrie RN Entered By: Yevonne PaxEpps, Carrie on 11/03/2020 09:19:49

## 2020-11-10 ENCOUNTER — Encounter (HOSPITAL_BASED_OUTPATIENT_CLINIC_OR_DEPARTMENT_OTHER): Payer: Managed Care, Other (non HMO) | Admitting: Internal Medicine

## 2020-11-10 ENCOUNTER — Other Ambulatory Visit: Payer: Self-pay

## 2020-11-10 DIAGNOSIS — E10621 Type 1 diabetes mellitus with foot ulcer: Secondary | ICD-10-CM | POA: Diagnosis not present

## 2020-11-11 NOTE — Progress Notes (Addendum)
[name], [name] ([id number]) Visit Report for 11/10/2020 Arrival Information Details Patient Name: Date of Service: [name], [name] 11/10/2020 3:00 PM Medical Record Number: [id number] Patient Account Number: [id number] Date of Birth/Sex: Treating RN: [birth year] (59 y.o. [name], [name] Primary Care [name]: [name] Other Clinician: Referring [name]: Treating [name]/Extender: [name] in Treatment: 1 Visit Information History Since Last Visit Added or deleted any medications: No Patient Arrived: Wheel Chair Any new allergies or adverse reactions: No Arrival Time: 15:05 Had a fall or experienced change in No Accompanied By: husband activities of daily living that may affect Transfer Assistance: None risk of falls: Patient Identification Verified: Yes Signs or symptoms of abuse/neglect since last visito No Secondary Verification Process Completed: Yes Hospitalized since last visit: No Patient Requires Transmission-Based Precautions: No Implantable device outside of the clinic excluding No Patient Has Alerts: Yes cellular tissue based products placed in the center Patient Alerts: R ABI 1.09 TBI .69 since last visit: L ABI 1.02 TBI .77 Has Dressing in Place as Prescribed: Yes Pain Present Now: No Electronic Signature(s) Signed: 11/11/2020 4:43:44 PM By: [name] Entered By: [name] on 11/11/2020 07:30:09 -------------------------------------------------------------------------------- Encounter Discharge Information Details Patient Name: Date of Service: [name], [name] Rector C. 11/10/2020 3:00 PM Medical Record Number: [id number] Patient Account Number: [id number] Date of Birth/Sex: Treating RN: 1961/05/23 (59 y.o. [name] Primary Care Allee Busk: [name] Other Clinician: Referring Winslow Verrill: Treating Sinahi Knights/Extender: [name] in Treatment: 1 Encounter Discharge Information Items  Post Procedure Vitals Discharge Condition: Stable Temperature (F): 98 Ambulatory Status: Wheelchair Pulse (bpm): 76 Discharge Destination: Home Respiratory Rate (breaths/min): 20 Transportation: Private Auto Blood Pressure (mmHg): 129/70 Accompanied By: husband Schedule Follow-up Appointment: Yes Clinical Summary of Care: Electronic Signature(s) Signed: 11/11/2020 4:43:44 PM By: [name] Entered By: [name] on 11/11/2020 08:35:25 -------------------------------------------------------------------------------- Multi Wound Chart Details Patient Name: Date of Service: [name]. 11/10/2020 3:00 PM Medical Record Number: [id number] Patient Account Number: [id number] Date of Birth/Sex: Treating RN: [birth year] (59 y.o. [name] Primary Care Trulee Hamstra: [name] Other Clinician: Referring Jaidah Lomax: Treating Constanza Mincy/Extender: [name] in Treatment: 1 Vital Signs Height(in): 64 Pulse(bpm): 76 Weight(lbs): 165 Blood Pressure(mmHg): 120/70 Body Mass Index(BMI): 28 Temperature(F): 98 Respiratory Rate(breaths/min): 20 Photos: [N/A:N/A] Right, Lateral Foot N/A N/A Wound Location: Trauma N/A N/A Wounding Event: Diabetic Wound/Ulcer of the Lower N/A N/A Primary Etiology: Extremity Asthma, Sleep Apnea, Peripheral N/A N/A Comorbid History: Venous Disease, Type I Diabetes, Neuropathy 06/03/2020 N/A N/A Date Acquired: 1 N/A N/A Weeks of Treatment: Open N/A N/A Wound Status: 0.9x1.1x0.1 N/A N/A Measurements L x W x D (cm) 0.778 N/A N/A A (cm) : rea 0.078 N/A N/A Volume (cm) : 51.40% N/A N/A % Reduction in A rea: 51.30% N/A N/A % Reduction in Volume: Grade 2 N/A N/A Classification: Medium N/A N/A Exudate A mount: Serosanguineous N/A N/A Exudate Type: red, brown N/A N/A Exudate Color: Distinct, outline attached N/A N/A Wound Margin: Large (67-100%) N/A N/A Granulation A mount: Red,  Hyper-granulation N/A N/A Granulation Quality: None Present (0%) N/A N/A Necrotic A mount: Fat Layer (Subcutaneous Tissue): Yes N/A N/A Exposed Structures: Fascia: No Tendon: No Muscle: No Joint: No Bone: No None N/A N/A Epithelialization: Debridement - Selective/Open Wound N/A N/A Debridement: Pre-procedure Verification/Time Out 15:54 N/A N/A Taken: Lidocaine 5% topical ointment N/A N/A Pain Control: Slough N/A N/A Tissue Debrided: Non-Viable Tissue N/A N/A Level: 0.99 N/A N/A Debridement A (sq cm): rea Curette N/A  N/A Instrument: Minimum N/A N/A Bleeding: Pressure N/A N/A Hemostasis A chieved: 0 N/A N/A Procedural Pain: 0 N/A N/A Post Procedural Pain: Procedure was tolerated well N/A N/A Debridement Treatment Response: 0.9x1.1x0.1 N/A N/A Post Debridement Measurements L x W x D (cm) 0.078 N/A N/A Post Debridement Volume: (cm) callus to periwound. N/A N/A Assessment Notes: Debridement N/A N/A Procedures Performed: Treatment Notes Wound #1 (Foot) Wound Laterality: Right, Lateral Cleanser Normal Saline Discharge Instruction: Cleanse the wound with Normal Saline or soap and water prior to applying a clean dressing using gauze sponges, not tissue or cotton balls. Peri-Wound Care Topical Primary Dressing Hydrofera Blue Ready Foam, 2.5 x2.5 in Discharge Instruction: Apply to wound bed as instructed Secondary Dressing Woven Gauze Sponge, Non-Sterile 4x4 in Discharge Instruction: Apply over primary dressing as directed. Secured With [employer] Roll Sterile, 4.5x3.1 (in/yd) Discharge Instruction: Secure with Kerlix as directed. Paper Tape, 2x10 (in/yd) Discharge Instruction: Secure dressing with tape as directed. Compression Wrap Compression Stockings Add-Ons Electronic Signature(s) Signed: 11/13/2020 8:09:40 AM By: [name], Michael MD Signed: 11/14/2020 5:46:53 PM By: Zandra AbtsLynch, [name] RN, BSN Entered By: [name], Michael on 11/13/2020  07:47:27 -------------------------------------------------------------------------------- Multi-Disciplinary Care Plan Details Patient Name: [name] 11/10/2020 3:00 PM Medical Record Number: [id number] Patient Account Number: [id number] Date of Birth/Sex: Treating RN: 10/17/1961 (59 y.o. Wynelle LinkF) [name], [name] Primary Care Tannisha Kennington: [name] RainierBarnes, Elizabeth Other Clinician: Referring Abbye Lao: Treating Venora Kautzman/Extender: [name] Longestobson, Michael Barnes, Elizabeth Weeks in Treatment: 1 Active Inactive Abuse / Safety / Falls / Self Care Management Nursing Diagnoses: Potential for falls Potential for injury related to falls Goals: Patient will not experience any injury related to falls Date Initiated: 11/03/2020 Target Resolution Date: 12/05/2020 Goal Status: Active Patient/caregiver will verbalize/demonstrate measures taken to prevent injury and/or falls Date Initiated: 11/03/2020 Target Resolution Date: 12/05/2020 Goal Status: Active Interventions: Assess Activities of Daily Living upon admission and as needed Assess fall risk on admission and as needed Assess: immobility, friction, shearing, incontinence upon admission and as needed Assess impairment of mobility on admission and as needed per policy Assess personal safety and home safety (as indicated) on admission and as needed Assess self care needs on admission and as needed Provide education on fall prevention Provide education on personal and home safety Notes: Nutrition Nursing Diagnoses: Impaired glucose control: actual or potential Potential for alteratiion in Nutrition/Potential for imbalanced nutrition Goals: Patient/caregiver agrees to and verbalizes understanding of need to use nutritional supplements and/or vitamins as prescribed Date Initiated: 11/03/2020 Target Resolution Date: 12/05/2020 Goal Status: Active Patient/caregiver will maintain therapeutic glucose control Date Initiated: 11/03/2020 Target  Resolution Date: 12/05/2020 Goal Status: Active Interventions: Assess HgA1c results as ordered upon admission and as needed Assess patient nutrition upon admission and as needed per policy Provide education on elevated blood sugars and impact on wound healing Provide education on nutrition Treatment Activities: Education provided on Nutrition : 11/03/2020 Notes: Wound/Skin Impairment Nursing Diagnoses: Impaired tissue integrity Knowledge deficit related to ulceration/compromised skin integrity Goals: Patient/caregiver will verbalize understanding of skin care regimen Date Initiated: 11/03/2020 Target Resolution Date: 12/05/2020 Goal Status: Active Ulcer/skin breakdown will have a volume reduction of 30% by week 4 Date Initiated: 11/03/2020 Target Resolution Date: 12/05/2020 Goal Status: Active Interventions: Assess patient/caregiver ability to obtain necessary supplies Assess patient/caregiver ability to perform ulcer/skin care regimen upon admission and as needed Assess ulceration(s) every visit Provide education on ulcer and skin care Notes: Electronic Signature(s) Signed: 11/11/2020 5:24:10 PM By: Zandra AbtsLynch, [name] RN, BSN Entered By: Zandra AbtsLynch, [name] on 11/11/2020 08:17:20 --------------------------------------------------------------------------------  Pain Assessment Details Patient Name: Date of Service: [name], [name] 11/10/2020 3:00 PM Medical Record Number: [id number] Patient Account Number: [id number] Date of Birth/Sex: Treating RN: 1961/11/14 (59 y.o. [name] Primary Care Ehan Freas: Other Clinician: Juluis [name] Referring Kaelyn Innocent: Treating Kennis Wissmann/Extender: [name] in Treatment: 1 Active Problems Location of Pain Severity and Description of Pain Patient Has Paino No Site Locations Rate the pain. Current Pain Level: 0 Pain Management and Medication Current Pain Management: Medication: No Cold Application: No Rest:  No Massage: No Activity: No T.E.N.S.: No Heat Application: No Leg drop or elevation: No Is the Current Pain Management Adequate: Adequate How does your wound impact your activities of daily livingo Sleep: No Bathing: No Appetite: No Relationship With Others: No Bladder Continence: No Emotions: No Bowel Continence: No Work: No Toileting: No Drive: No Dressing: No Hobbies: No Electronic Signature(s) Signed: 11/11/2020 4:43:44 PM By: [name] Entered By: [name] on 11/11/2020 07:30:32 -------------------------------------------------------------------------------- Patient/Caregiver Education Details Patient Name: Date of Service: [name] 12/13/2021andnbsp3:00 PM Medical Record Number: [id number] Patient Account Number: [id number] Date of Birth/Gender: Treating RN: 1961-05-31 (59 y.o. [name] Primary Care Physician: [name] Other Clinician: Referring Physician: Treating Physician/Extender: [name] in Treatment: 1 Education Assessment Education Provided To: Patient Education Topics Provided Wound/Skin Impairment: Methods: Explain/Verbal Responses: State content correctly Electronic Signature(s) Signed: 11/11/2020 5:24:10 PM By: Zandra Abts RN, BSN Entered By: Zandra Abts on 11/11/2020 08:17:33 -------------------------------------------------------------------------------- Wound Assessment Details Patient Name: Date of Service: [name] 11/10/2020 3:00 PM Medical Record Number: [id number] Patient Account Number: [id number] Date of Birth/Sex: Treating RN: 11-07-1961 (59 y.o. [name], Millard.Loa Primary Care Lydiana Milley: [name] Other Clinician: Referring Dru Laurel: Treating Simranjit Thayer/Extender: [name] in Treatment: 1 Wound Status Wound Number: 1 Primary Diabetic Wound/Ulcer of the Lower Extremity Etiology: Wound Location: Right, Lateral Foot Wound  Open Wounding Event: Trauma Status: Date Acquired: 06/03/2020 Comorbid Asthma, Sleep Apnea, Peripheral Venous Disease, Type I Weeks Of Treatment: 1 History: Diabetes, Neuropathy Clustered Wound: No Photos Photo Uploaded By: Benjaman Kindler on 11/12/2020 14:55:09 Wound Measurements Length: (cm) 0.9 Width: (cm) 1.1 Depth: (cm) 0.1 Area: (cm) 0.778 Volume: (cm) 0.078 % Reduction in Area: 51.4% % Reduction in Volume: 51.3% Epithelialization: None Tunneling: No Undermining: No Wound Description Classification: Grade 2 Wound Margin: Distinct, outline attached Exudate Amount: Medium Exudate Type: Serosanguineous Exudate Color: red, brown Foul Odor After Cleansing: No Slough/Fibrino No Wound Bed Granulation Amount: Large (67-100%) Exposed Structure Granulation Quality: Red, Hyper-granulation Fascia Exposed: No Necrotic Amount: None Present (0%) Fat Layer (Subcutaneous Tissue) Exposed: Yes Tendon Exposed: No Muscle Exposed: No Joint Exposed: No Bone Exposed: No Assessment Notes callus to periwound. Treatment Notes Wound #1 (Foot) Wound Laterality: Right, Lateral Cleanser Normal Saline Discharge Instruction: Cleanse the wound with Normal Saline or soap and water prior to applying a clean dressing using gauze sponges, not tissue or cotton balls. Peri-Wound Care Topical Primary Dressing Hydrofera Blue Ready Foam, 2.5 x2.5 in Discharge Instruction: Apply to wound bed as instructed Secondary Dressing Woven Gauze Sponge, Non-Sterile 4x4 in Discharge Instruction: Apply over primary dressing as directed. Secured With [employer], 4.5x3.1 (in/yd) Discharge Instruction: Secure with Kerlix as directed. Paper Tape, 2x10 (in/yd) Discharge Instruction: Secure dressing with tape as directed. Compression Wrap Compression Stockings Add-Ons Electronic Signature(s) Signed: 11/11/2020 4:43:44 PM By: [name] Entered By: [name] on 11/11/2020  07:31:44 -------------------------------------------------------------------------------- Vitals Details Patient Name: Date of Service: [name] Iha, [name] Merleen Nicely  C. 11/10/2020 3:00 PM Medical Record Number: [id number] Patient Account Number: [id number] Date of Birth/Sex: Treating RN: 1961-01-26 (59 y.o. [name], Millard.Loa Primary Care Korey Arroyo: [name] Other Clinician: Referring Rayson Rando: Treating Coraleigh Sheeran/Extender: [name] in Treatment: 1 Vital Signs Time Taken: 15:05 Temperature (F): 98 Height (in): 64 Pulse (bpm): 76 Weight (lbs): 165 Respiratory Rate (breaths/min): 20 Body Mass Index (BMI): 28.3 Blood Pressure (mmHg): 120/70 Reference Range: 80 - 120 mg / dl Electronic Signature(s) Signed: 11/11/2020 4:43:44 PM By: [name] Entered By: [name] on 11/11/2020 07:30:23

## 2020-11-14 NOTE — Progress Notes (Signed)
[name], [name] ([id number]) Visit Report for 11/10/2020 Debridement Details Patient Name: Date of Service: [place], [name] 11/10/2020 3:00 PM Medical Record Number: [id number] Patient Account Number: [id number] Date of Birth/Sex: Treating RN: 14-Sep-1961 (59 y.o. [name] Link Primary Care Provider: Juluis [name] Other Clinician: Referring Provider: Treating Provider/Extender: [name] in Treatment: 1 Debridement Performed for Assessment: Wound #1 Right,Lateral Foot Performed By: Physician [name] Debridement Type: Debridement Severity of Tissue Pre Debridement: Fat layer exposed Level of Consciousness (Pre-procedure): Awake and Alert Pre-procedure Verification/Time Out Yes - 15:54 Taken: Start Time: 15:54 Pain Control: Lidocaine 5% topical ointment T Area Debrided (L x W): otal 0.9 (cm) x 1.1 (cm) = 0.99 (cm) Tissue and other material debrided: Non-Viable, Eschar, Slough, Slough Level: Non-Viable Tissue Debridement Description: Selective/Open Wound Instrument: Curette Bleeding: Minimum Hemostasis Achieved: Pressure End Time: 15:55 Procedural Pain: 0 Post Procedural Pain: 0 Response to Treatment: Procedure was tolerated well Level of Consciousness (Post- Awake and Alert procedure): Post Debridement Measurements of Total Wound Length: (cm) 0.9 Width: (cm) 1.1 Depth: (cm) 0.1 Volume: (cm) 0.078 Character of Wound/Ulcer Post Debridement: Improved Severity of Tissue Post Debridement: Fat layer exposed Post Procedure Diagnosis Same as Pre-procedure Electronic Signature(s) Signed: 11/13/2020 8:09:40 AM By: [name] Najjar [name] Signed: 11/14/2020 5:46:53 PM By: [name] Previous Signature: 11/11/2020 5:24:10 PM Version By: [name] Entered By: [name] Najjar on 11/13/2020 07:56:43 -------------------------------------------------------------------------------- HPI Details Patient Name: Date of  Service: [name] Rudd C. 11/10/2020 3:00 PM Medical Record Number: [id number] Patient Account Number: [id number] Date of Birth/Sex: Treating RN: 21-Jan-1961 (59 y.o. [name] Link Primary Care Provider: Juluis [name] Other Clinician: Referring Provider: Treating Provider/Extender: [name] in Treatment: 1 History of Present Illness HPI Description: ADMISSION 11/03/2020 this is a 59 year old woman with type 2 diabetes. She has severe peripheral neuropathy and autonomic neuropathy. 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 Her problem started at the beginning of July her husband noted a bleeding area in her sock. Unfortunately this became infected requiring admission to hospital from 7/12 through 06/13/2020. An MRI was negative for osteo she received IV antibiotics and was discharged on doxycycline. She did have standard arterial ABIs and TBI's but I did not see any waveforms. Fortunately the ABIs and TBI's were normal at 1.09 and 0.69 on the right and 1.01 and 1.02 on the left respectively. Her last visit with Dr. Allena Katz of podiatry was on 07/04/2020. At 1 which time the measurements were 1.1 x 0.8 x 0.3 she was using a dry dressing. She went on to see Dr. Grandville Silos [facility] on 8/[facility] at which time the wound was debrided and they were prescribed Santyl. The wound is actually on the lateral foot over the base of the fifth metatarsal. She had an additional wound on her right heel but this closed over. They are currently soaking the foot in Epson salts and using Santyl and they have been doing this for quite a period of time. Past medical history includes type 1 diabetes managed with an insulin pump, movement disorder, hypothyroidism, obstructive sleep apnea. She has a cam boot and she wears this religiously spending most of her day currently in bed but she needs the boot on to get up to use her bedside  commode ABIs were done during the hospitalization in July that showed the numbers quoted above 12/13; right lateral foot which  is in the setting of an inverted ankle and likely to be this patient's full weightbearing surface. There is some eschar and slough around the wound circumference but in general the wound surface does not look too bad. She has new diabetic shoes and a brace for when this heals hopefully this will maintain skin integrity. Other than that I think she would need an attempt at surgery if a procedure is available to try and correct this weightbearing to form Electronic Signature(s) Signed: 11/13/2020 8:09:40 AM By: [name], Khyson Sebesta [name] Entered By: [name], Altagracia Rone on 11/13/2020 07:55:13 -------------------------------------------------------------------------------- Physical Exam Details Patient Name: Date of Service: [name] SprinklesSNYDER, [name] 11/10/2020 3:00 PM Medical Record Number: [id number] Patient Account Number: [id number] Date of Birth/Sex: Treating RN: 05/03/1961 (59 y.o. [name]) [name] Primary Care Provider: Juluis RainierBarnes, [name] Other Clinician: Referring Provider: Treating Provider/Extender: [name] Longestobson, Aris Moman [name] Weeks in Treatment: 1 Constitutional Sitting or standing Blood Pressure is within target range for patient.. Pulse regular and within target range for patient.Marland Kitchen. Respirations regular, non-labored and within target range.. Temperature is normal and within the target range for the patient.Marland Kitchen. Appears in no distress. Notes Wound exam; this is on the lateral foot at the base of the fifth metatarsal. Not as hyper granulated today. Most of the problem here is on the circumference of the wound with eschar or slough giving a nonviable surface for epithelialization. I used a curette to remove some of this. Minimal bleeding. There is no evidence of surrounding in for Electronic Signature(s) Signed: 11/13/2020 8:09:40 AM By: [name], [name] Kachmar [name] Entered By: [name],  Brydon Spahr on 11/13/2020 07:56:07 -------------------------------------------------------------------------------- Physician Orders Details Patient Name: Date of Service: [name] SprinklesSNYDER, [name] 11/10/2020 3:00 PM Medical Record Number: [id number] Patient Account Number: [id number] Date of Birth/Sex: Treating RN: 09/10/1961 (59 y.o. [name]) [name] Primary Care Provider: Juluis RainierBarnes, [name] Other Clinician: Referring Provider: Treating Provider/Extender: [name] Longestobson, Romuald Mccaslin [name] Weeks in Treatment: 1 Verbal / Phone Orders: No Diagnosis Coding ICD-10 Coding Code Description E10.621 Type 1 diabetes mellitus with foot ulcer L97.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 severity E10.42 Type 1 diabetes mellitus with diabetic polyneuropathy M14.671 Charcot's joint, right ankle and foot Follow-up Appointments Return Appointment in 1 week. Bathing/ Shower/ Hygiene May shower and wash wound with soap and water. - on days that dressing is changed Off-Loading Removable cast walker boot to: - right foot Other: - Minimal weight bearing on right foot Wound Treatment Wound #1 - Foot Wound Laterality: Right, Lateral Cleanser: Normal Saline (Generic) Every Other Day/15 Days Discharge Instructions: Cleanse the wound with Normal Saline or soap and water prior to applying a clean dressing using gauze sponges, not tissue or cotton balls. Prim Dressing: Hydrofera Blue Ready Foam, 2.5 x2.5 in (Generic) Every Other Day/15 Days ary Discharge Instructions: Apply to wound bed as instructed Secondary Dressing: Woven Gauze Sponge, Non-Sterile 4x4 in (Generic) Every Other Day/15 Days Discharge Instructions: Apply over primary dressing as directed. Secured With: [employer] Roll Sterile, 4.5x3.1 (in/yd) (Generic) Every Other Day/15 Days Discharge Instructions: Secure with Kerlix as directed. Secured With: Paper Tape, 2x10 (in/yd) (Generic) Every Other Day/15 Days Discharge  Instructions: Secure dressing with tape as directed. Electronic Signature(s) Signed: 11/11/2020 5:24:10 PM By: [name] AbtsLynch, [name] RN, [name] Signed: 11/13/2020 8:09:40 AM By: [name], Domanique Huesman [name] Entered By: [name] AbtsLynch, [name] on 11/11/2020 08:17:06 -------------------------------------------------------------------------------- Problem List Details Patient Name: Date of Service: [name], [name] 11/10/2020 3:00 PM Medical Record Number: [id number] Patient Account Number: [id number] Date of Birth/Sex: Treating  RN: 01-19-1961 (59 y.o. [name] Link Primary Care Provider: Juluis [name] Other Clinician: Referring Provider: Treating Provider/Extender: [name] in Treatment: 1 Active Problems ICD-10 Encounter Code Description Active Date MDM Diagnosis E10.621 Type 1 diabetes mellitus with foot ulcer 11/03/2020 No Yes L97.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 11/03/2020 No Yes severity E10.42 Type 1 diabetes mellitus with diabetic polyneuropathy 11/03/2020 No Yes M14.671 Charcot's joint, right ankle and foot 11/03/2020 No Yes Inactive Problems Resolved Problems Electronic Signature(s) Signed: 11/13/2020 8:09:40 AM By: [name] Najjar [name] Previous Signature: 11/11/2020 5:24:10 PM Version By: [name] Entered By: [name] Najjar on 11/13/2020 07:47:20 -------------------------------------------------------------------------------- Progress Note Details Patient Name: Date of Service: [name] Rudd C. 11/10/2020 3:00 PM Medical Record Number: [id number] Patient Account Number: [id number] Date of Birth/Sex: Treating RN: 11/07/1961 (59 y.o. [name] Link Primary Care Provider: Juluis [name] Other Clinician: Referring Provider: Treating Provider/Extender: [name] in Treatment: 1 Subjective History of Present Illness (HPI) ADMISSION 11/03/2020 this is a 59 year old woman  with type 2 diabetes. She has severe peripheral neuropathy and autonomic neuropathy. 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 Her problem started at the beginning of July her husband noted a bleeding area in her sock. Unfortunately this became infected requiring admission to hospital from 7/12 through 06/13/2020. An MRI was negative for osteo she received IV antibiotics and was discharged on doxycycline. She did have standard arterial ABIs and TBI's but I did not see any waveforms. Fortunately the ABIs and TBI's were normal at 1.09 and 0.69 on the right and 1.01 and 1.02 on the left respectively. Her last visit with Dr. Allena Katz of podiatry was on 07/04/2020. At 1 which time the measurements were 1.1 x 0.8 x 0.3 she was using a dry dressing. She went on to see Dr. Grandville S[facility] on [facility] at which time the wound was debrided and they were prescribed Santyl. The wound is actually on the lateral foot over the base of the fifth metatarsal. She had an additional wound on her right heel but this closed over. They are currently soaking the foot in Epson salts and using Santyl and they have been doing this for quite a period of time. Past medical history includes type 1 diabetes managed with an insulin pump, movement disorder, hypothyroidism, obstructive sleep apnea. She has a cam boot and she wears this religiously spending most of her day currently in bed but she needs the boot on to get up to use her bedside commode ABIs were done during the hospitalization in July that showed the numbers quoted above 12/13; right lateral foot which is in the setting of an inverted ankle and likely to be this patient's full weightbearing surface. There is some eschar and slough around the wound circumference but in general the wound surface does not look too bad. She has new diabetic shoes and a brace for when this heals hopefully this will maintain skin  integrity. Other than that I think she would need an attempt at surgery if a procedure is available to try and correct this weightbearing to form Objective Constitutional Sitting or standing Blood Pressure is within target range for patient.. Pulse regular and within target range for patient.Marland Kitchen Respirations regular, non-labored and within target range.. Temperature is normal and within the target range for the patient.Marland Kitchen Appears in no distress. Vitals Time Taken: 3:05 PM,  Height: 64 in, Weight: 165 lbs, BMI: 28.3, Temperature: 98 F, Pulse: 76 bpm, Respiratory Rate: 20 breaths/min, Blood Pressure: 120/70 mmHg. General Notes: Wound exam; this is on the lateral foot at the base of the fifth metatarsal. Not as hyper granulated today. Most of the problem here is on the circumference of the wound with eschar or slough giving a nonviable surface for epithelialization. I used a curette to remove some of this. Minimal bleeding. There is no evidence of surrounding in for Integumentary (Hair, Skin) Wound #1 status is Open. Original cause of wound was Trauma. The wound is located on the Right,Lateral Foot. The wound measures 0.9cm length x 1.1cm width x 0.1cm depth; 0.778cm^2 area and 0.078cm^3 volume. There is Fat Layer (Subcutaneous Tissue) exposed. There is no tunneling or undermining noted. There is a medium amount of serosanguineous drainage noted. The wound margin is distinct with the outline attached to the wound base. There is large (67- 100%) red, hyper - granulation within the wound bed. There is no necrotic tissue within the wound bed. General Notes: callus to periwound. Assessment Active Problems ICD-10 Type 1 diabetes mellitus with foot ulcer Non-pressure chronic ulcer of other part of right foot with other specified severity Type 1 diabetes mellitus with diabetic polyneuropathy Charcot's joint, right ankle and foot Procedures Wound #1 Pre-procedure diagnosis of Wound #1 is a Diabetic  Wound/Ulcer of the Lower Extremity located on the Right,Lateral Foot .Severity of Tissue Pre Debridement is: Fat layer exposed. There was a Selective/Open Wound Non-Viable Tissue Debridement with a total area of 0.99 sq cm performed by [name]. With the following instrument(s): Curette to remove Non-Viable tissue/material. Material removed includes Eschar and Slough and after achieving pain control using Lidocaine 5% topical ointment. No specimens were taken. A time out was conducted at 15:54, prior to the start of the procedure. A Minimum amount of bleeding was controlled with Pressure. The procedure was tolerated well with a pain level of 0 throughout and a pain level of 0 following the procedure. Post Debridement Measurements: 0.9cm length x 1.1cm width x 0.1cm depth; 0.078cm^3 volume. Character of Wound/Ulcer Post Debridement is improved. Severity of Tissue Post Debridement is: Fat layer exposed. Post procedure Diagnosis Wound #1: Same as Pre-Procedure Plan Follow-up Appointments: Return Appointment in 1 week. Bathing/ Shower/ Hygiene: May shower and wash wound with soap and water. - on days that dressing is changed Off-Loading: Removable cast walker boot to: - right foot Other: - Minimal weight bearing on right foot WOUND #1: - Foot Wound Laterality: Right, Lateral Cleanser: Normal Saline (Generic) Every Other Day/15 Days Discharge Instructions: Cleanse the wound with Normal Saline or soap and water prior to applying a clean dressing using gauze sponges, not tissue or cotton balls. Prim Dressing: Hydrofera Blue Ready Foam, 2.5 x2.5 in (Generic) Every Other Day/15 Days ary Discharge Instructions: Apply to wound bed as instructed Secondary Dressing: Woven Gauze Sponge, Non-Sterile 4x4 in (Generic) Every Other Day/15 Days Discharge Instructions: Apply over primary dressing as directed. Secured With: [employer], 4.5x3.1 (in/yd) (Generic) Every Other Day/15  Days Discharge Instructions: Secure with Kerlix as directed. Secured With: Paper T ape, 2x10 (in/yd) (Generic) Every Other Day/15 Days Discharge Instructions: Secure dressing with tape as directed. 1Primary dressing and Hydrofera Blue, gauze, Kerlix. Husband is changing this. 2. The patient has a new shoe and brace available for when this wound actually heals. Hopefully that this can alter the mechanics enough that this is not a full weightbearing surface otherwise  I do not think this is going to maintain closure. We are making improvements currently however. 3 I am not sure if there is a surgical procedure that can be done to try and correct the deformity here. I am not sure if she is been evaluated for this in the past however he would likely to be a complicated procedure. I am not sure that she is ready to make a decision like this. Electronic Signature(s) Signed: 11/13/2020 8:09:40 AM By: [name] Najjar [name] Entered By: [name] Najjar on 11/13/2020 07:59:13 -------------------------------------------------------------------------------- SuperBill Details Patient Name: Date of Service: [name] Sprinkles 11/10/2020 Medical Record Number: [id number] Patient Account Number: [id number] Date of Birth/Sex: Treating RN: 05-14-1961 (59 y.o. [name] Link Primary Care Provider: Juluis [name] Other Clinician: Referring Provider: Treating Provider/Extender: [name] in Treatment: 1 Diagnosis Coding ICD-10 Codes Code Description [phone] Type 1 diabetes mellitus with foot ulcer L97.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 severity E10.42 Type 1 diabetes mellitus with diabetic polyneuropathy M14.671 Charcot's joint, right ankle and foot Facility Procedures CPT4 Code: [id number] Description: [phone] - DEBRIDE WOUND 1ST 20 SQ CM OR < ICD-10 Diagnosis Description L97.518 Non-pressure chronic ulcer of other part of right foot  with other specified sever Modifier: ity Quantity: 1 Physician Procedures : CPT4 Code Description Modifier [id number] - WC PHYS DEBR WO ANESTH 20 SQ CM ICD-10 Diagnosis Description L97.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 severity Quantity: 1 Electronic Signature(s) Signed: 11/13/2020 8:09:40 AM By: [name] Najjar [name] Previous Signature: 11/11/2020 5:24:10 PM Version By: [name] Entered By: [name] Najjar on 11/13/2020 07:59:26

## 2020-11-17 ENCOUNTER — Other Ambulatory Visit: Payer: Self-pay

## 2020-11-17 ENCOUNTER — Encounter (HOSPITAL_BASED_OUTPATIENT_CLINIC_OR_DEPARTMENT_OTHER): Payer: Managed Care, Other (non HMO) | Admitting: Internal Medicine

## 2020-11-17 DIAGNOSIS — E10621 Type 1 diabetes mellitus with foot ulcer: Secondary | ICD-10-CM | POA: Diagnosis not present

## 2020-11-17 NOTE — Progress Notes (Signed)
JOZIE, WULF ([id number]) Visit Report for [name] Patient Name: Date of Service: [name], [name] 3:15 PM Medical Record Number: [id number] Patient Account Number: [id number] Date of Birth/Sex: Treating RN: Nov 13, 1961 (59 y.o. [name] Primary Care Provider: Juluis Rainier Other Clinician: Referring Provider: Treating Provider/Extender: Arelia Longest in Treatment: 2 Debridement Performed for Assessment: Wound #1 Right,Lateral Foot Performed By: Physician Maxwell Caul., MD Debridement Type: Debridement Severity of Tissue Pre Debridement: Fat layer exposed Level of Consciousness (Pre-procedure): Awake and Alert Pre-procedure Verification/Time Out Yes - 16:30 Taken: Start Time: 16:30 T Area Debrided (L x W): otal 0.6 (cm) x 0.9 (cm) = 0.54 (cm) Tissue and other material debrided: Viable, Non-Viable, Subcutaneous Level: Skin/Subcutaneous Tissue Debridement Description: Excisional Instrument: Curette Bleeding: Moderate Hemostasis Achieved: Silver Nitrate End Time: 16:31 Procedural Pain: 0 Post Procedural Pain: 0 Response to Treatment: Procedure was tolerated well Level of Consciousness (Post- Awake and Alert procedure): Post Debridement Measurements of Total Wound Length: (cm) 0.6 Width: (cm) 0.9 Depth: (cm) 0.1 Volume: (cm) 0.042 Character of Wound/Ulcer Post Debridement: Improved Severity of Tissue Post Debridement: Fat layer exposed Post Procedure Diagnosis Same as Pre-procedure Electronic Signature(s) Signed: 11/17/2020 5:38:15 PM By: Zandra Abts RN, BSN Signed: 11/17/2020 7:23:52 PM By: Baltazar Najjar MD Entered By: Baltazar Najjar on [name] 17:33:14 -------------------------------------------------------------------------------- HPI Details Patient Name: Date of Service: [name] 3:15 PM Medical Record Number: [id number] Patient Account Number: [id number] Date  of Birth/Sex: Treating RN: 08/01/1961 (59 y.o. [name] Primary Care Provider: Juluis Rainier Other Clinician: Referring Provider: Treating Provider/Extender: Arelia Longest in Treatment: 2 History of Present Illness HPI Description: ADMISSION 11/03/2020 this is a 59 year old woman with type 2 diabetes. She has severe peripheral neuropathy and autonomic neuropathy. 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 Her problem started at the beginning of July her husband noted a bleeding area in her sock. Unfortunately this became infected requiring admission to hospital from 7/12 through 06/13/2020. An MRI was negative for osteo she received IV antibiotics and was discharged on doxycycline. She did have standard arterial ABIs and TBI's but I did not see any waveforms. Fortunately the ABIs and TBI's were normal at 1.09 and 0.69 on the right and 1.01 and 1.02 on the left respectively. Her last visit with Dr. Allena Katz of podiatry was on 07/04/2020. At 1 which time the measurements were 1.1 x 0.8 x 0.3 she was using a dry dressing. She went on to see Dr. Grandville Silos [facility] on 8/30 at which time the wound was debrided and they were prescribed Santyl. The wound is actually on the lateral foot over the base of the fifth metatarsal. She had an additional wound on her right heel but this closed over. They are currently soaking the foot in Epson salts and using Santyl and they have been doing this for quite a period of time. Past medical history includes type 1 diabetes managed with an insulin pump, movement disorder, hypothyroidism, obstructive sleep apnea. She has a cam boot and she wears this religiously spending most of her day currently in bed but she needs the boot on to get up to use her bedside commode ABIs were done during the hospitalization in July that showed the numbers quoted above 12/13; right lateral  foot which is in the setting of an inverted ankle and likely to be this patient's full weightbearing surface. There  is some eschar and slough around the wound circumference but in general the wound surface does not look too bad. She has new diabetic shoes and a brace for when this heals hopefully this will maintain skin integrity. Other than that I think she would need an attempt at surgery if a procedure is available to try and correct this weightbearing to form 12/20; right lateral foot in the setting of a severely inverted ankle. The patient is minimally ambulatory at this point. The areas over the right fifth metatarsal base. Electronic Signature(s) Signed: 11/17/2020 7:23:52 PM By: Baltazar Najjar MD Entered By: Baltazar Najjar on [name] 17:35:11 -------------------------------------------------------------------------------- Physical Exam Details Patient Name: Date of Service: [name] 3:15 PM Medical Record Number: [id number] Patient Account Number: [id number] Date of Birth/Sex: Treating RN: [birth year] (59 y.o. [name] Primary Care Provider: Juluis Rainier Other Clinician: Referring Provider: Treating Provider/Extender: Arelia Longest in Treatment: 2 Constitutional Sitting or standing Blood Pressure is within target range for patient.. Pulse regular and within target range for patient.Marland Kitchen Respirations regular, non-labored and within target range.Marland Kitchen Appears in no distress. Notes Wound exam; lateral foot at the base of the fifth metatarsal on the right. This is now a small wound but with thick raised skin and callus around the edges I removed this with a #3 curette as well as some fibrinous adherent debris from the surface of the wound. Hemostasis with silver nitrate Electronic Signature(s) Signed: 11/17/2020 7:23:52 PM By: Baltazar Najjar MD Entered By: Baltazar Najjar on [name]  17:35:50 -------------------------------------------------------------------------------- Physician Orders Details Patient Name: Date of Service: [name] 3:15 PM Medical Record Number: [id number] Patient Account Number: [id number] Date of Birth/Sex: Treating RN: September 19, 1961 (59 y.o. [name] Primary Care Provider: Juluis Rainier Other Clinician: Referring Provider: Treating Provider/Extender: Arelia Longest in Treatment: 2 Verbal / Phone Orders: No Diagnosis Coding ICD-10 Coding Code Description E10.621 Type 1 diabetes mellitus with foot ulcer L97.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 severity E10.42 Type 1 diabetes mellitus with diabetic polyneuropathy M14.671 Charcot's joint, right ankle and foot Follow-up Appointments Return Appointment in 2 weeks. Bathing/ Shower/ Hygiene May shower and wash wound with soap and water. - on days that dressing is changed Off-Loading Removable cast walker boot to: - right foot Other: - Minimal weight bearing on right foot Wound Treatment Wound #1 - Foot Wound Laterality: Right, Lateral Cleanser: Normal Saline (Generic) Every Other Day/15 Days Discharge Instructions: Cleanse the wound with Normal Saline or soap and water prior to applying a clean dressing using gauze sponges, not tissue or cotton balls. Prim Dressing: Hydrofera Blue Ready Foam, 2.5 x2.5 in (Generic) Every Other Day/15 Days ary Discharge Instructions: Apply to wound bed as instructed Secondary Dressing: Woven Gauze Sponge, Non-Sterile 4x4 in (Generic) Every Other Day/15 Days Discharge Instructions: Apply over primary dressing as directed. Secured With: [employer], 4.5x3.1 (in/yd) (Generic) Every Other Day/15 Days Discharge Instructions: Secure with Kerlix as directed. Secured With: Paper Tape, 2x10 (in/yd) (Generic) Every Other Day/15 Days Discharge Instructions: Secure  dressing with tape as directed. Electronic Signature(s) Signed: 11/17/2020 5:38:15 PM By: Zandra Abts RN, BSN Signed: 11/17/2020 7:23:52 PM By: Baltazar Najjar MD Entered By: Zandra Abts on [name] 16:33:08 -------------------------------------------------------------------------------- Problem List Details Patient Name: Date of Service: [place], [name] [name] 3:15 PM Medical Record Number: [id number] Patient Account Number: [id number] Date of Birth/Sex: Treating RN: 08-Jun-1961 (59 y.o. [name] Primary  Care Provider: Juluis Rainier Other Clinician: Referring Provider: Treating Provider/Extender: Arelia Longest in Treatment: 2 Active Problems ICD-10 Encounter Code Description Active Date MDM Diagnosis E10.621 Type 1 diabetes mellitus with foot ulcer 11/03/2020 No Yes L97.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 11/03/2020 No Yes severity E10.42 Type 1 diabetes mellitus with diabetic polyneuropathy 11/03/2020 No Yes M14.671 Charcot's joint, right ankle and foot 11/03/2020 No Yes Inactive Problems Resolved Problems Electronic Signature(s) Signed: 11/17/2020 7:23:52 PM By: Baltazar Najjar MD Entered By: Baltazar Najjar on [name] 17:32:58 -------------------------------------------------------------------------------- Progress Note Details Patient Name: Date of Service: [name] 3:15 PM Medical Record Number: [id number] Patient Account Number: [id number] Date of Birth/Sex: Treating RN: 1961-06-04 (59 y.o. [name] Primary Care Provider: Juluis Rainier Other Clinician: Referring Provider: Treating Provider/Extender: Arelia Longest in Treatment: 2 Subjective History of Present Illness (HPI) ADMISSION 11/03/2020 this is a 59 year old woman with type 2 diabetes. She has severe peripheral neuropathy and autonomic neuropathy. She has an  inverted right ankle and a Charcot deformity for which she has a specialized brace. Her problem started at the beginning of July her husband noted a bleeding area in her sock. Unfortunately this became infected requiring admission to hospital from 7/12 through 06/13/2020. An MRI was negative for osteo she received IV antibiotics and was discharged on doxycycline. She did have standard arterial ABIs and TBI's but I did not see any waveforms. Fortunately the ABIs and TBI's were normal at 1.09 and 0.69 on the right and 1.01 and 1.02 on the left respectively. Her last visit with Dr. Allena Katz of podiatry was on 07/04/2020. At 1 which time the measurements were 1.1 x 0.8 x 0.3 she was using a dry dressing. She went on to see Dr. Grandville S[facility] on 8/[facility] at which time the wound was debrided and they were prescribed Santyl. The wound is actually on the lateral foot over the base of the fifth metatarsal. She had an additional wound on her right heel but this closed over. They are currently soaking the foot in Epson salts and using Santyl and they have been doing this for quite a period of time. Past medical history includes type 1 diabetes managed with an insulin pump, movement disorder, hypothyroidism, obstructive sleep apnea. She has a cam boot and she wears this religiously spending most of her day currently in bed but she needs the boot on to get up to use her bedside commode ABIs were done during the hospitalization in July that showed the numbers quoted above 12/13; right lateral foot which is in the setting of an inverted ankle and likely to be this patient's full weightbearing surface. There is some eschar and slough around the wound circumference but in general the wound surface does not look too bad. She has new diabetic shoes and a brace for when this heals hopefully this will maintain skin integrity. Other than that I think she would need an attempt at surgery if a procedure is available to  try and correct this weightbearing to form 12/20; right lateral foot in the setting of a severely inverted ankle. The patient is minimally ambulatory at this point. The areas over the right fifth metatarsal base. Objective Constitutional Sitting or standing Blood Pressure is within target range for patient.. Pulse regular and within target range for patient.Marland Kitchen Respirations regular, non-labored and within target range.Marland Kitchen Appears in no distress. Vitals Time Taken: 3:41 PM, Height: 64  in, Weight: 165 lbs, BMI: 28.3, Temperature: 97.6 F, Pulse: 76 bpm, Respiratory Rate: 20 breaths/min, Blood Pressure: 117/73 mmHg. General Notes: Wound exam; lateral foot at the base of the fifth metatarsal on the right. This is now a small wound but with thick raised skin and callus around the edges I removed this with a #3 curette as well as some fibrinous adherent debris from the surface of the wound. Hemostasis with silver nitrate Integumentary (Hair, Skin) Wound #1 status is Open. Original cause of wound was Trauma. The wound is located on the Right,Lateral Foot. The wound measures 0.6cm length x 0.9cm width x 0.1cm depth; 0.424cm^2 area and 0.042cm^3 volume. There is Fat Layer (Subcutaneous Tissue) exposed. There is no tunneling or undermining noted. There is a medium amount of serosanguineous drainage noted. The wound margin is distinct with the outline attached to the wound base. There is large (67- 100%) red, hyper - granulation within the wound bed. There is no necrotic tissue within the wound bed. Assessment Active Problems ICD-10 Type 1 diabetes mellitus with foot ulcer Non-pressure chronic ulcer of other part of right foot with other specified severity Type 1 diabetes mellitus with diabetic polyneuropathy Charcot's joint, right ankle and foot Procedures Wound #1 Pre-procedure diagnosis of Wound #1 is a Diabetic Wound/Ulcer of the Lower Extremity located on the Right,Lateral Foot .Severity of Tissue  Pre Debridement is: Fat layer exposed. There was a Excisional Skin/Subcutaneous Tissue Debridement with a total area of 0.54 sq cm performed by Maxwell Caul., MD. With the following instrument(s): Curette to remove Viable and Non-Viable tissue/material. Material removed includes Subcutaneous Tissue. A time out was conducted at 16:30, prior to the start of the procedure. A Moderate amount of bleeding was controlled with Silver Nitrate. The procedure was tolerated well with a pain level of 0 throughout and a pain level of 0 following the procedure. Post Debridement Measurements: 0.6cm length x 0.9cm width x 0.1cm depth; 0.042cm^3 volume. Character of Wound/Ulcer Post Debridement is improved. Severity of Tissue Post Debridement is: Fat layer exposed. Post procedure Diagnosis Wound #1: Same as Pre-Procedure Plan Follow-up Appointments: Return Appointment in 2 weeks. Bathing/ Shower/ Hygiene: May shower and wash wound with soap and water. - on days that dressing is changed Off-Loading: Removable cast walker boot to: - right foot Other: - Minimal weight bearing on right foot WOUND #1: - Foot Wound Laterality: Right, Lateral Cleanser: Normal Saline (Generic) Every Other Day/15 Days Discharge Instructions: Cleanse the wound with Normal Saline or soap and water prior to applying a clean dressing using gauze sponges, not tissue or cotton balls. Prim Dressing: Hydrofera Blue Ready Foam, 2.5 x2.5 in (Generic) Every Other Day/15 Days ary Discharge Instructions: Apply to wound bed as instructed Secondary Dressing: Woven Gauze Sponge, Non-Sterile 4x4 in (Generic) Every Other Day/15 Days Discharge Instructions: Apply over primary dressing as directed. Secured With: [employer], 4.5x3.1 (in/yd) (Generic) Every Other Day/15 Days Discharge Instructions: Secure with Kerlix as directed. Secured With: Paper T ape, 2x10 (in/yd) (Generic) Every Other Day/15 Days Discharge Instructions: Secure  dressing with tape as directed. 1. I continued with Hydrofera Blue 2. Hopefully debridement today will allow for epithelialization. 3. She is minimally ambulatory in a cam boot 4. Incidentally noted erythema on the dorsal part of the right fourth toe I thought this was probably a friction injury. She is insensate so it was difficult to determine if there is any pain Electronic Signature(s) Signed: 11/17/2020 7:23:52 PM By: Baltazar Najjar MD Entered By: Leanord Hawking,  Lulla Linville on [name] 17:36:57 -------------------------------------------------------------------------------- SuperBill Details Patient Name: Date of Service: [place], [name] [name] Medical Record Number: [id number] Patient Account Number: [id number] Date of Birth/Sex: Treating RN: 10/31/1961 (59 y.o. Wynelle LinkF) [name], [name] Primary Care Provider: Juluis RainierBarnes, Elizabeth Other Clinician: Referring Provider: Treating Provider/Extender: Arelia Longestobson, Nykia Turko Barnes, Elizabeth Weeks in Treatment: 2 Diagnosis Coding ICD-10 Codes Code Description 531872212010.621 Type 1 diabetes mellitus with foot ulcer L97.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 severity E10.42 Type 1 diabetes mellitus with diabetic polyneuropathy M14.671 Charcot's joint, right ankle and foot Facility Procedures CPT4 Code: [id number] Description: 11042 - DEB SUBQ TISSUE 20 SQ CM/< ICD-10 Diagnosis Description L97.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 sev Modifier: erity Quantity: 1 Physician Procedures : CPT4 Code Description Modifier [id number] 11042 - WC PHYS SUBQ TISS 20 SQ CM ICD-10 Diagnosis Description L97.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 severity Quantity: 1 Electronic Signature(s) Signed: 11/17/2020 7:23:52 PM By: Baltazar Najjarobson, Camrie Stock MD Entered By: Baltazar Najjarobson, Krissy Orebaugh on [name] 17:37:14

## 2020-11-18 NOTE — Progress Notes (Signed)
[name], [name] ([id number]) Visit Report for 11/17/2020 Arrival Information Details Patient Name: Date of Service: [name], [name] 11/17/2020 3:15 PM Medical Record Number: [id number] Patient Account Number: [id number] Date of Birth/Sex: Treating RN: 04-Apr-1961 (59 y.o. [name] Primary Care Ezekiel Menzer: Juluis Rainier Other Clinician: Referring Loveta Dellis: Treating Keyonta Barradas/Extender: Arelia Longest in Treatment: 2 Visit Information History Since Last Visit Added or deleted any medications: No Patient Arrived: Wheel Chair Any new allergies or adverse reactions: No Arrival Time: 15:40 Had a fall or experienced change in No Accompanied By: husband activities of daily living that may affect Transfer Assistance: Manual risk of falls: Patient Identification Verified: Yes Signs or symptoms of abuse/neglect since last visito No Secondary Verification Process Completed: Yes Hospitalized since last visit: No Patient Requires Transmission-Based Precautions: No Implantable device outside of the clinic excluding No Patient Has Alerts: Yes cellular tissue based products placed in the center Patient Alerts: R ABI 1.09 TBI .69 since last visit: L ABI 1.02 TBI .77 Has Dressing in Place as Prescribed: Yes Pain Present Now: No Electronic Signature(s) Signed: 11/18/2020 2:20:57 PM By: Karl Ito Entered By: Karl Ito on 11/17/2020 15:41:07 -------------------------------------------------------------------------------- Encounter Discharge Information Details Patient Name: Date of Service: [name] 11/17/2020 3:15 PM Medical Record Number: [id number] Patient Account Number: [id number] Date of Birth/Sex: Treating RN: [birth year] (59 y.o. Arta Silence Primary Care Temeka Pore: Juluis Rainier Other Clinician: Referring Rocky Rishel: Treating Shady Padron/Extender: Arelia Longest in Treatment: 2 Encounter Discharge  Information Items Post Procedure Vitals Discharge Condition: Stable Temperature (F): 97.6 Ambulatory Status: Wheelchair Pulse (bpm): 76 Discharge Destination: Home Respiratory Rate (breaths/min): 20 Transportation: Private Auto Blood Pressure (mmHg): 117/73 Accompanied By: husband Schedule Follow-up Appointment: Yes Clinical Summary of Care: Electronic Signature(s) Signed: 11/17/2020 5:23:06 PM By: Shawn Stall Entered By: Shawn Stall on 11/17/2020 17:20:36 -------------------------------------------------------------------------------- Lower Extremity Assessment Details Patient Name: Date of Service: [name], [name] 11/17/2020 3:15 PM Medical Record Number: [id number] Patient Account Number: [id number] Date of Birth/Sex: Treating RN: Nov 08, 1961 (59 y.o. Debara Pickett, Yvonne Kendall Primary Care Jeancarlo Leffler: Juluis Rainier Other Clinician: Referring Donia Yokum: Treating Sherill Wegener/Extender: Arelia Longest in Treatment: 2 Edema Assessment Assessed: Kyra Searles: No] Franne Forts: Yes] Edema: [Left: N] [Right: o] Calf Left: Right: Point of Measurement: 28 cm From Medial Instep 29.5 cm Ankle Left: Right: Point of Measurement: 10 cm From Medial Instep 18 cm Vascular Assessment Pulses: Dorsalis Pedis Palpable: [Right:Yes] Electronic Signature(s) Signed: 11/17/2020 5:23:06 PM By: Shawn Stall Entered By: Shawn Stall on 11/17/2020 16:02:31 -------------------------------------------------------------------------------- Multi Wound Chart Details Patient Name: Date of Service: [name] 11/17/2020 3:15 PM Medical Record Number: [id number] Patient Account Number: [id number] Date of Birth/Sex: Treating RN: 07-23-1961 (59 y.o. [name] Primary Care Marnita Poirier: Juluis Rainier Other Clinician: Referring Jazalynn Mireles: Treating Toren Tucholski/Extender: Arelia Longest in Treatment: 2 Vital Signs Height(in): 64 Pulse(bpm): 76 Weight(lbs):  165 Blood Pressure(mmHg): 117/73 Body Mass Index(BMI): 28 Temperature(F): 97.6 Respiratory Rate(breaths/min): 20 Photos: [1:No Photos Right, Lateral Foot] [N/A:N/A N/A] Wound Location: [1:Trauma] [N/A:N/A] Wounding Event: [1:Diabetic Wound/Ulcer of the Lower] [N/A:N/A] Primary Etiology: [1:Extremity Asthma, Sleep Apnea, Peripheral] [N/A:N/A] Comorbid History: [1:Venous Disease, Type I Diabetes, Neuropathy 06/03/2020] [N/A:N/A] Date Acquired: [1:2] [N/A:N/A] Weeks of Treatment: [1:Open] [N/A:N/A] Wound Status: [1:0.6x0.9x0.1] [N/A:N/A] Measurements L x W x D (cm) [1:0.424] [N/A:N/A] A (cm) : rea [1:0.042] [N/A:N/A] Volume (cm) : [1:73.50%] [N/A:N/A] % Reduction in A rea: [1:73.80%] [N/A:N/A] % Reduction in Volume: [1:Grade 2] [N/A:N/A] Classification: [1:Medium] [N/A:N/A] Exudate A mount: [  1:Serosanguineous] [N/A:N/A] Exudate Type: [1:red, brown] [N/A:N/A] Exudate Color: [1:Distinct, outline attached] [N/A:N/A] Wound Margin: [1:Large (67-100%)] [N/A:N/A] Granulation A mount: [1:Red, Hyper-granulation] [N/A:N/A] Granulation Quality: [1:None Present (0%)] [N/A:N/A] Necrotic A mount: [1:Fat Layer (Subcutaneous Tissue): Yes N/A] Exposed Structures: [1:Fascia: No Tendon: No Muscle: No Joint: No Bone: No Small (1-33%)] [N/A:N/A] Epithelialization: [1:Debridement - Excisional] [N/A:N/A] Debridement: Pre-procedure Verification/Time Out 16:30 [N/A:N/A] Taken: [1:Subcutaneous] [N/A:N/A] Tissue Debrided: [1:Skin/Subcutaneous Tissue] [N/A:N/A] Level: [1:0.54] [N/A:N/A] Debridement A (sq cm): [1:rea Curette] [N/A:N/A] Instrument: [1:Moderate] [N/A:N/A] Bleeding: [1:Silver Nitrate] [N/A:N/A] Hemostasis A chieved: [1:0] [N/A:N/A] Procedural Pain: [1:0] [N/A:N/A] Post Procedural Pain: [1:Procedure was tolerated well] [N/A:N/A] Debridement Treatment Response: [1:0.6x0.9x0.1] [N/A:N/A] Post Debridement Measurements L x W x D (cm) [1:0.042] [N/A:N/A] Post Debridement Volume: (cm)  [1:Debridement] [N/A:N/A] Treatment Notes Wound #1 (Foot) Wound Laterality: Right, Lateral Cleanser Normal Saline Discharge Instruction: Cleanse the wound with Normal Saline or soap and water prior to applying a clean dressing using gauze sponges, not tissue or cotton balls. Peri-Wound Care Topical Primary Dressing Hydrofera Blue Ready Foam, 2.5 x2.5 in Discharge Instruction: Apply to wound bed as instructed Secondary Dressing Woven Gauze Sponge, Non-Sterile 4x4 in Discharge Instruction: Apply over primary dressing as directed. Secured With [employer], 4.5x3.1 (in/yd) Discharge Instruction: Secure with Kerlix as directed. Paper Tape, 2x10 (in/yd) Discharge Instruction: Secure dressing with tape as directed. Compression Wrap Compression Stockings Add-Ons Electronic Signature(s) Signed: 11/17/2020 5:38:15 PM By: Zandra Abts RN, BSN Signed: 11/17/2020 7:23:52 PM By: Baltazar Najjar MD Entered By: Baltazar Najjar on 11/17/2020 17:33:03 -------------------------------------------------------------------------------- Multi-Disciplinary Care Plan Details Patient Name: Date of Service: [place], [name] 11/17/2020 3:15 PM Medical Record Number: [id number] Patient Account Number: [id number] Date of Birth/Sex: Treating RN: Mar 24, 1961 (59 y.o. [name] Primary Care Maquita Sandoval: Juluis Rainier Other Clinician: Referring Tayja Manzer: Treating Tynell Winchell/Extender: Arelia Longest in Treatment: 2 Active Inactive Abuse / Safety / Falls / Self Care Management Nursing Diagnoses: Potential for falls Potential for injury related to falls Goals: Patient will not experience any injury related to falls Date Initiated: 11/03/2020 Target Resolution Date: 12/05/2020 Goal Status: Active Patient/caregiver will verbalize/demonstrate measures taken to prevent injury and/or falls Date Initiated: 11/03/2020 Target Resolution Date: 12/05/2020 Goal Status:  Active Interventions: Assess Activities of Daily Living upon admission and as needed Assess fall risk on admission and as needed Assess: immobility, friction, shearing, incontinence upon admission and as needed Assess impairment of mobility on admission and as needed per policy Assess personal safety and home safety (as indicated) on admission and as needed Assess self care needs on admission and as needed Provide education on fall prevention Provide education on personal and home safety Notes: Nutrition Nursing Diagnoses: Impaired glucose control: actual or potential Potential for alteratiion in Nutrition/Potential for imbalanced nutrition Goals: Patient/caregiver agrees to and verbalizes understanding of need to use nutritional supplements and/or vitamins as prescribed Date Initiated: 11/03/2020 Target Resolution Date: 12/05/2020 Goal Status: Active Patient/caregiver will maintain therapeutic glucose control Date Initiated: 11/03/2020 Target Resolution Date: 12/05/2020 Goal Status: Active Interventions: Assess HgA1c results as ordered upon admission and as needed Assess patient nutrition upon admission and as needed per policy Provide education on elevated blood sugars and impact on wound healing Provide education on nutrition Treatment Activities: Education provided on Nutrition : 11/03/2020 Notes: Wound/Skin Impairment Nursing Diagnoses: Impaired tissue integrity Knowledge deficit related to ulceration/compromised skin integrity Goals: Patient/caregiver will verbalize understanding of skin care regimen Date Initiated: 11/03/2020 Target Resolution Date: 12/05/2020 Goal Status: Active Ulcer/skin breakdown will have a volume reduction of  30% by week 4 Date Initiated: 11/03/2020 Target Resolution Date: 12/05/2020 Goal Status: Active Interventions: Assess patient/caregiver ability to obtain necessary supplies Assess patient/caregiver ability to perform ulcer/skin care regimen upon  admission and as needed Assess ulceration(s) every visit Provide education on ulcer and skin care Notes: Electronic Signature(s) Signed: 11/17/2020 5:38:15 PM By: Zandra AbtsLynch, [name] RN, BSN Entered By: Zandra AbtsLynch, [name] on 11/17/2020 17:35:34 -------------------------------------------------------------------------------- Pain Assessment Details Patient Name: Date of Service: [name] SprinklesSNYDER, MA RY C. 11/17/2020 3:15 PM Medical Record Number: [id number] Patient Account Number: [id number] Date of Birth/Sex: Treating RN: 10/31/1961 (59 y.o. Wynelle LinkF) [name], [name] Primary Care Sevon Rotert: Juluis RainierBarnes, Elizabeth Other Clinician: Referring Leen Tworek: Treating Leiland Mihelich/Extender: Arelia Longestobson, Michael Barnes, Elizabeth Weeks in Treatment: 2 Active Problems Location of Pain Severity and Description of Pain Patient Has Paino No Site Locations Pain Management and Medication Current Pain Management: Electronic Signature(s) Signed: 11/17/2020 5:38:15 PM By: Zandra AbtsLynch, [name] RN, BSN Signed: 11/18/2020 2:20:57 PM By: Karl Itoawkins, Destiny Entered By: Karl Itoawkins, Destiny on 11/17/2020 15:41:28 -------------------------------------------------------------------------------- Patient/Caregiver Education Details Patient Name: Date of Service: [name] SprinklesSNYDER, MA RY C. 12/20/2021andnbsp3:15 PM Medical Record Number: [id number] Patient Account Number: [id number] Date of Birth/Gender: Treating RN: 07/22/1961 (59 y.o. Wynelle LinkF) [name], [name] Primary Care Physician: Juluis RainierBarnes, Elizabeth Other Clinician: Referring Physician: Treating Physician/Extender: Arelia Longestobson, Michael Barnes, Elizabeth Weeks in Treatment: 2 Education Assessment Education Provided To: Patient Education Topics Provided Wound/Skin Impairment: Methods: Explain/Verbal Responses: State content correctly Electronic Signature(s) Signed: 11/17/2020 5:38:15 PM By: Zandra AbtsLynch, [name] RN, BSN Entered By: Zandra AbtsLynch, [name] on 11/17/2020  17:35:42 -------------------------------------------------------------------------------- Wound Assessment Details Patient Name: Date of Service: [name] SprinklesSNYDER, MA RY C. 11/17/2020 3:15 PM Medical Record Number: [id number] Patient Account Number: [id number] Date of Birth/Sex: Treating RN: 06/29/1961 (59 y.o. Wynelle LinkF) [name], [name] Primary Care Jeanene Mena: Juluis RainierBarnes, Elizabeth Other Clinician: Referring Leiyah Maultsby: Treating Angelik Walls/Extender: Arelia Longestobson, Michael Barnes, Elizabeth Weeks in Treatment: 2 Wound Status Wound Number: 1 Primary Diabetic Wound/Ulcer of the Lower Extremity Etiology: Wound Location: Right, Lateral Foot Wound Open Wounding Event: Trauma Status: Date Acquired: 06/03/2020 Comorbid Asthma, Sleep Apnea, Peripheral Venous Disease, Type I Weeks Of Treatment: 2 History: Diabetes, Neuropathy Clustered Wound: No Wound Measurements Length: (cm) 0.6 Width: (cm) 0.9 Depth: (cm) 0.1 Area: (cm) 0.424 Volume: (cm) 0.042 % Reduction in Area: 73.5% % Reduction in Volume: 73.8% Epithelialization: Small (1-33%) Tunneling: No Undermining: No Wound Description Classification: Grade 2 Wound Margin: Distinct, outline attached Exudate Amount: Medium Exudate Type: Serosanguineous Exudate Color: red, brown Foul Odor After Cleansing: No Slough/Fibrino No Wound Bed Granulation Amount: Large (67-100%) Exposed Structure Granulation Quality: Red, Hyper-granulation Fascia Exposed: No Necrotic Amount: None Present (0%) Fat Layer (Subcutaneous Tissue) Exposed: Yes Tendon Exposed: No Muscle Exposed: No Joint Exposed: No Bone Exposed: No Treatment Notes Wound #1 (Foot) Wound Laterality: Right, Lateral Cleanser Normal Saline Discharge Instruction: Cleanse the wound with Normal Saline or soap and water prior to applying a clean dressing using gauze sponges, not tissue or cotton balls. Peri-Wound Care Topical Primary Dressing Hydrofera Blue Ready Foam, 2.5 x2.5 in Discharge Instruction: Apply  to wound bed as instructed Secondary Dressing Woven Gauze Sponge, Non-Sterile 4x4 in Discharge Instruction: Apply over primary dressing as directed. Secured With [employer] Roll Sterile, 4.5x3.1 (in/yd) Discharge Instruction: Secure with Kerlix as directed. Paper Tape, 2x10 (in/yd) Discharge Instruction: Secure dressing with tape as directed. Compression Wrap Compression Stockings Add-Ons Electronic Signature(s) Signed: 11/17/2020 5:23:06 PM By: Shawn Stalleaton, Bobbi Signed: 11/17/2020 5:38:15 PM By: Zandra AbtsLynch, [name] RN, BSN Entered By: Shawn Stalleaton, Bobbi on 11/17/2020 16:02:55 -------------------------------------------------------------------------------- Vitals Details Patient Name: Date of Service: Ilsa IhaSNYDER, MA RY C. 11/17/2020  3:15 PM Medical Record Number: [id number] Patient Account Number: [id number] Date of Birth/Sex: Treating RN: 1961-01-29 (59 y.o. [name] Primary Care Persephone Schriever: Juluis Rainier Other Clinician: Referring Myshawn Chiriboga: Treating Rayyan Burley/Extender: Arelia Longest in Treatment: 2 Vital Signs Time Taken: 15:41 Temperature (F): 97.6 Height (in): 64 Pulse (bpm): 76 Weight (lbs): 165 Respiratory Rate (breaths/min): 20 Body Mass Index (BMI): 28.3 Blood Pressure (mmHg): 117/73 Reference Range: 80 - 120 mg / dl Electronic Signature(s) Signed: 11/18/2020 2:20:57 PM By: Karl Ito Entered By: Karl Ito on 11/17/2020 15:41:20

## 2020-12-01 ENCOUNTER — Other Ambulatory Visit: Payer: Self-pay

## 2020-12-01 ENCOUNTER — Encounter (HOSPITAL_BASED_OUTPATIENT_CLINIC_OR_DEPARTMENT_OTHER): Payer: Managed Care, Other (non HMO) | Attending: Internal Medicine | Admitting: Internal Medicine

## 2020-12-01 DIAGNOSIS — E10610 Type 1 diabetes mellitus with diabetic neuropathic arthropathy: Secondary | ICD-10-CM | POA: Diagnosis not present

## 2020-12-01 DIAGNOSIS — L97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 severity: Secondary | ICD-10-CM | POA: Insufficient documentation

## 2020-12-01 DIAGNOSIS — E10621 Type 1 diabetes mellitus with foot ulcer: Secondary | ICD-10-CM | POA: Insufficient documentation

## 2020-12-01 DIAGNOSIS — E1043 Type 1 diabetes mellitus with diabetic autonomic (poly)neuropathy: Secondary | ICD-10-CM | POA: Insufficient documentation

## 2020-12-01 DIAGNOSIS — L03115 Cellulitis of right lower limb: Secondary | ICD-10-CM | POA: Insufficient documentation

## 2020-12-01 NOTE — Progress Notes (Addendum)
[name] [name] C. ([id number]) Visit Report for 12/01/2020 HPI Details Patient Name: Date of Service: [name], [name] 12/01/2020 2:00 PM Medical Record Number: [id number] Patient Account Number: [id number] Date of Birth/Sex: Treating RN: 05/01/1961 (60 y.o. [name]) [name] Primary Care Provider: Juluis RainierBarnes, Elizabeth Other Clinician: Referring Provider: Treating Provider/Extender: Arelia Longestobson, Venola Castello Barnes, Elizabeth Weeks in Treatment: 4 History of Present Illness HPI Description: ADMISSION 11/03/2020 this is a 60 year old woman with type 2 diabetes. She has severe peripheral neuropathy and autonomic neuropathy. 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 Her problem started at the beginning of July her husband noted a bleeding area in her sock. Unfortunately this became infected requiring admission to hospital from 7/12 through 06/13/2020. An MRI was negative for osteo she received IV antibiotics and was discharged on doxycycline. She did have standard arterial ABIs and TBI's but I did not see any waveforms. Fortunately the ABIs and TBI's were normal at 1.09 and 0.69 on the right and 1.01 and 1.02 on the left respectively. Her last visit with Dr. Allena KatzPatel of podiatry was on 07/04/2020. At 1 which time the measurements were 1.1 x 0.8 x 0.3 she was using a dry dressing. She went on to see Dr. Grandville SilosPetrie at [facility] on 8/30 at which time the wound was debrided and they were prescribed Santyl. The wound is actually on the lateral foot over the base of the fifth metatarsal. She had an additional wound on her right heel but this closed over. They are currently soaking the foot in Epson salts and using Santyl and they have been doing this for quite a period of time. Past medical history includes type 1 diabetes managed with an insulin pump, movement disorder, hypothyroidism, obstructive sleep apnea. She has a cam boot and she wears this religiously spending  most of her day currently in bed but she needs the boot on to get up to use her bedside commode ABIs were done during the hospitalization in July that showed the numbers quoted above 12/13; right lateral foot which is in the setting of an inverted ankle and likely to be this patient's full weightbearing surface. There is some eschar and slough around the wound circumference but in general the wound surface does not look too bad. She has new diabetic shoes and a brace for when this heals hopefully this will maintain skin integrity. Other than that I think she would need an attempt at surgery if a procedure is available to try and correct this weightbearing to form 12/20; right lateral foot in the setting of a severely inverted ankle. The patient is minimally ambulatory at this point. The areas over the right fifth metatarsal base. 12/01/2020; her right lateral foot wound looks a lot better however she comes in today with full epithelialized over the right second third and fourth toes with intense erythema and the erythema is spreading into the dorsal foot. Her husband is able to show me pictures which seems to start around Christmas day. She is insensate and therefore does not feel pain. She has not had any systemic symptoms including fever chills. Her CBGs have been in the usual range for her. She has an allergy to penicillin based on a childhood issue but they tell me she has had ampicillin or amoxicillin since then and tolerated it well other than the fact that most antibiotics seem to cause her nausea Electronic Signature(s) Signed: 12/01/2020 3:00:47 PM By: Baltazar Najjarobson, Meldon Hanzlik MD Entered  By: Baltazar Najjarobson, Neveah Bang on 12/01/2020 14:41:09 -------------------------------------------------------------------------------- Physical Exam Details Patient Name: Date of Service: [name] SprinklesSNYDER, MA RY C. 12/01/2020 2:00 PM Medical Record Number: [id number] Patient Account Number: [id number] Date of Birth/Sex: Treating  RN: 04/22/1961 (60 y.o. [name]) [name] Primary Care Provider: Juluis RainierBarnes, Elizabeth Other Clinician: Referring Provider: Treating Provider/Extender: Arelia Longestobson, Roslin Norwood Barnes, Elizabeth Weeks in Treatment: 4 Constitutional Sitting or standing Blood Pressure is within target range for patient.. Pulse regular and within target range for patient.Marland Kitchen. Respirations regular, non-labored and within target range.. Temperature is normal and within the target range for the patient.Marland Kitchen. Appears in no distress. Cardiovascular Pedal pulses are palpable. Notes Wound exam; lateral foot at the base of the fifth metatarsal on the right this is smaller with some debris around the surface which I removed. No formal debridement. She has a bright erythema on the second third and fourth toes dorsally she has lost epithelialization and the erythema extends into the dorsal foot. I have marked the area of erythema to judge progress. Electronic Signature(s) Signed: 12/01/2020 3:00:47 PM By: Baltazar Najjarobson, Adiva Boettner MD Entered By: Baltazar Najjarobson, Kashten Gowin on 12/01/2020 14:45:05 -------------------------------------------------------------------------------- Physician Orders Details Patient Name: Date of Service: [name] SprinklesSNYDER, MA RY C. 12/01/2020 2:00 PM Medical Record Number: [id number] Patient Account Number: [id number] Date of Birth/Sex: Treating RN: 09/27/1961 (60 y.o. [name]) [name] Primary Care Provider: Juluis RainierBarnes, Elizabeth Other Clinician: Referring Provider: Treating Provider/Extender: Arelia Longestobson, Belita Warsame Barnes, Elizabeth Weeks in Treatment: 4 Verbal / Phone Orders: No Diagnosis Coding ICD-10 Coding Code Description E10.621 Type 1 diabetes mellitus with foot ulcer L97.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 severity E10.42 Type 1 diabetes mellitus with diabetic polyneuropathy M14.671 Charcot's joint, right ankle and foot Follow-up Appointments Return Appointment in 1 week. Bathing/ Shower/  Hygiene May shower and wash wound with soap and water. - on days that dressing is changed Off-Loading Removable cast walker boot to: - right foot Other: - Minimal weight bearing on right foot Wound Treatment Wound #1 - Foot Wound Laterality: Right, Lateral Cleanser: Soap and Water 1 x Per Day/7 Days Discharge Instructions: May shower and wash wound with dial antibacterial soap and water prior to dressing change. Prim Dressing: Hydrofera Blue Ready Foam, 2.5 x2.5 in (Generic) 1 x Per Day/7 Days ary Discharge Instructions: Apply to wound bed as instructed Secondary Dressing: Woven Gauze Sponge, Non-Sterile 4x4 in (Generic) 1 x Per Day/7 Days Discharge Instructions: Apply over primary dressing as directed. Secured With: [employer]erl[employer] Roll Sterile, 4.5x3.1 (in/yd) (Generic) 1 x Per Day/7 Days Discharge Instructions: Secure with Kerlix as directed. Secured With: Paper Tape, 2x10 (in/yd) (Generic) 1 x Per Day/7 Days Discharge Instructions: Secure dressing with tape as directed. Wound #2 - T Second oe Wound Laterality: Right Cleanser: Soap and Water 1 x Per Day/7 Days Discharge Instructions: May shower and wash wound with dial antibacterial soap and water prior to dressing change. Prim Dressing: KerraCel Ag Gelling Fiber Dressing, 2x2 in (silver alginate) ary 1 x Per Day/7 Days Discharge Instructions: Weave in between toes Secondary Dressing: Woven Gauze Sponges 2x2 in 1 x Per Day/7 Days Discharge Instructions: Apply over primary dressing as directed. Secured With: Insurance underwriterConforming Stretch Gauze Bandage, Sterile 2x75 (in/in) 1 x Per Day/7 Days Discharge Instructions: Secure with stretch gauze as directed. Secured With: Paper Tape, 1x10 (in/yd) 1 x Per Day/7 Days Discharge Instructions: Secure dressing with tape as directed. Wound #3 - T Third oe Wound Laterality: Right Cleanser: Soap and Water 1 x Per Day/7 Days Discharge Instructions: May shower and  wash wound with dial antibacterial soap and water  prior to dressing change. Prim Dressing: KerraCel Ag Gelling Fiber Dressing, 2x2 in (silver alginate) ary 1 x Per Day/7 Days Discharge Instructions: Weave in between toes Secondary Dressing: Woven Gauze Sponges 2x2 in 1 x Per Day/7 Days Discharge Instructions: Apply over primary dressing as directed. Secured With: Insurance underwriter, Sterile 2x75 (in/in) 1 x Per Day/7 Days Discharge Instructions: Secure with stretch gauze as directed. Secured With: Paper Tape, 1x10 (in/yd) 1 x Per Day/7 Days Discharge Instructions: Secure dressing with tape as directed. Wound #4 - T Fourth oe Cleanser: Soap and Water 1 x Per Day/7 Days Discharge Instructions: May shower and wash wound with dial antibacterial soap and water prior to dressing change. Prim Dressing: KerraCel Ag Gelling Fiber Dressing, 2x2 in (silver alginate) ary 1 x Per Day/7 Days Discharge Instructions: Weave in between toes Secondary Dressing: Woven Gauze Sponges 2x2 in 1 x Per Day/7 Days Discharge Instructions: Apply over primary dressing as directed. Secured With: Insurance underwriter, Sterile 2x75 (in/in) 1 x Per Day/7 Days Discharge Instructions: Secure with stretch gauze as directed. Secured With: Paper Tape, 1x10 (in/yd) 1 x Per Day/7 Days Discharge Instructions: Secure dressing with tape as directed. Patient Medications llergies: codeine A Notifications Medication Indication Start End celluiltis right foot 12/01/2020 cephalexin DOSE oral 500 mg capsule - 1 capsule oral q8h for 1 week 12/01/2020 ondansetron HCl DOSE oral 4 mg tablet - 1 tablet oral bid prn nausea Electronic Signature(s) Signed: 12/01/2020 2:54:20 PM By: Baltazar Najjar MD Entered By: Baltazar Najjar on 12/01/2020 14:54:18 -------------------------------------------------------------------------------- Problem List Details Patient Name: Date of Service: [name]. 12/01/2020 2:00 PM Medical Record Number: [id number] Patient  Account Number: [id number] Date of Birth/Sex: Treating RN: 08-11-1961 (60 y.o. [name] Primary Care Provider: Juluis Rainier Other Clinician: Referring Provider: Treating Provider/Extender: Arelia Longest in Treatment: 4 Active Problems ICD-10 Encounter Code Description Active Date MDM Diagnosis E10.621 Type 1 diabetes mellitus with foot ulcer 11/03/2020 No Yes L97.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 11/03/2020 No Yes severity E10.42 Type 1 diabetes mellitus with diabetic polyneuropathy 11/03/2020 No Yes L03.115 Cellulitis of right lower limb 12/01/2020 No Yes M14.671 Charcot's joint, right ankle and foot 11/03/2020 No Yes Inactive Problems Resolved Problems Electronic Signature(s) Signed: 12/01/2020 3:00:47 PM By: Baltazar Najjar MD Entered By: Baltazar Najjar on 12/01/2020 14:39:30 -------------------------------------------------------------------------------- Progress Note Details Patient Name: Date of Service: [name]. 12/01/2020 2:00 PM Medical Record Number: [id number] Patient Account Number: [id number] Date of Birth/Sex: Treating RN: 10/03/1961 (60 y.o. [name] Primary Care Provider: Juluis Rainier Other Clinician: Referring Provider: Treating Provider/Extender: Arelia Longest in Treatment: 4 Subjective History of Present Illness (HPI) ADMISSION 11/03/2020 this is a 60 year old woman with type 2 diabetes. She has severe peripheral neuropathy and autonomic neuropathy. 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 Her problem started at the beginning of July her husband noted a bleeding area in her sock. Unfortunately this became infected requiring admission to hospital from 7/12 through 06/13/2020. An MRI was negative for osteo she received IV antibiotics and was discharged on doxycycline. She did have  standard arterial ABIs and TBI's but I did not see any waveforms. Fortunately the ABIs and TBI's were normal at 1.09 and 0.69 on the right and 1.01 and 1.02 on the left respectively. Her last visit with Dr.  Patel of podiatry was on 07/04/2020. At 1 which time the measurements were 1.1 x 0.8 x 0.3 she was using a dry dressing. She went on to see Dr. Babs Bertin at [facility] on 8/30 at which time the wound was debrided and they were prescribed Santyl. The wound is actually on the lateral foot over the base of the fifth metatarsal. She had an additional wound on her right heel but this closed over. They are currently soaking the foot in Epson salts and using Santyl and they have been doing this for quite a period of time. Past medical history includes type 1 diabetes managed with an insulin pump, movement disorder, hypothyroidism, obstructive sleep apnea. She has a cam boot and she wears this religiously spending most of her day currently in bed but she needs the boot on to get up to use her bedside commode ABIs were done during the hospitalization in July that showed the numbers quoted above 12/13; right lateral foot which is in the setting of an inverted ankle and likely to be this patient's full weightbearing surface. There is some eschar and slough around the wound circumference but in general the wound surface does not look too bad. She has new diabetic shoes and a brace for when this heals hopefully this will maintain skin integrity. Other than that I think she would need an attempt at surgery if a procedure is available to try and correct this weightbearing to form 12/20; right lateral foot in the setting of a severely inverted ankle. The patient is minimally ambulatory at this point. The areas over the right fifth metatarsal base. 12/01/2020; her right lateral foot wound looks a lot better however she comes in today with full epithelialized over the right second third and fourth toes  with intense erythema and the erythema is spreading into the dorsal foot. Her husband is able to show me pictures which seems to start around Christmas day. She is insensate and therefore does not feel pain. She has not had any systemic symptoms including fever chills. Her CBGs have been in the usual range for her. She has an allergy to penicillin based on a childhood issue but they tell me she has had ampicillin or amoxicillin since then and tolerated it well other than the fact that most antibiotics seem to cause her nausea Allergies codeine Objective Constitutional Sitting or standing Blood Pressure is within target range for patient.. Pulse regular and within target range for patient.Marland Kitchen Respirations regular, non-labored and within target range.. Temperature is normal and within the target range for the patient.Marland Kitchen Appears in no distress. Vitals Time Taken: 1:56 PM, Height: 64 in, Weight: 165 lbs, BMI: 28.3, Temperature: 98.2 F, Pulse: 74 bpm, Respiratory Rate: 16 breaths/min, Blood Pressure: 103/69 mmHg. Cardiovascular Pedal pulses are palpable. General Notes: Wound exam; lateral foot at the base of the fifth metatarsal on the right this is smaller with some debris around the surface which I removed. No formal debridement. ooShe has a bright erythema on the second third and fourth toes dorsally she has lost epithelialization and the erythema extends into the dorsal foot. I have marked the area of erythema to judge progress. Integumentary (Hair, Skin) Wound #1 status is Open. Original cause of wound was Trauma. The wound is located on the Right,Lateral Foot. The wound measures 0.4cm length x 0.5cm width x 0.3cm depth; 0.157cm^2 area and 0.047cm^3 volume. There is Fat Layer (Subcutaneous Tissue) exposed. There is no tunneling noted, however, there is undermining starting at  7:00 and ending at 11:00 with a maximum distance of 0.3cm. There is a medium amount of serosanguineous drainage noted.  The wound margin is distinct with the outline attached to the wound base. There is large (67-100%) red, hyper - granulation within the wound bed. There is a small (1- 33%) amount of necrotic tissue within the wound bed including Adherent Slough. Wound #2 status is Open. Original cause of wound was Blister. The wound is located on the Right T Second. The wound measures 0.5cm length x 0.5cm oe width x 0.1cm depth; 0.196cm^2 area and 0.02cm^3 volume. There is no tunneling or undermining noted. There is a medium amount of serosanguineous drainage noted. The wound margin is distinct with the outline attached to the wound base. There is medium (34-66%) red, pink granulation within the wound bed. There is a medium (34-66%) amount of necrotic tissue within the wound bed including Adherent Slough. Wound #3 status is Open. Original cause of wound was Blister. The wound is located on the Right T Third. The wound measures 3cm length x 3cm width x oe 0.1cm depth; 7.069cm^2 area and 0.707cm^3 volume. There is no tunneling or undermining noted. There is a medium amount of serosanguineous drainage noted. The wound margin is distinct with the outline attached to the wound base. There is large (67-100%) red, pink granulation within the wound bed. There is a small (1-33%) amount of necrotic tissue within the wound bed including Adherent Slough. Wound #4 status is Open. Original cause of wound was Blister. The wound is located on the T Fourth. The wound measures 2.5cm length x 2cm width x oe 0.2cm depth; 3.927cm^2 area and 0.785cm^3 volume. There is Fat Layer (Subcutaneous Tissue) exposed. There is no tunneling or undermining noted. There is a medium amount of purulent drainage noted. The wound margin is distinct with the outline attached to the wound base. There is small (1-33%) red, pink granulation within the wound bed. There is a large (67-100%) amount of necrotic tissue within the wound bed including Adherent  Slough. Assessment Active Problems ICD-10 Type 1 diabetes mellitus with foot ulcer Non-pressure chronic ulcer of other part of right foot with other specified severity Type 1 diabetes mellitus with diabetic polyneuropathy Cellulitis of right lower limb Charcot's joint, right ankle and foot Plan Follow-up Appointments: Return Appointment in 1 week. Bathing/ Shower/ Hygiene: May shower and wash wound with soap and water. - on days that dressing is changed Off-Loading: Removable cast walker boot to: - right foot Other: - Minimal weight bearing on right foot The following medication(s) was prescribed: cephalexin oral 500 mg capsule 1 capsule oral q8h for 1 week for celluiltis right foot starting 12/01/2020 ondansetron HCl oral 4 mg tablet 1 tablet oral bid prn nausea starting 12/01/2020 WOUND #1: - Foot Wound Laterality: Right, Lateral Cleanser: Soap and Water 1 x Per Day/7 Days Discharge Instructions: May shower and wash wound with dial antibacterial soap and water prior to dressing change. Prim Dressing: Hydrofera Blue Ready Foam, 2.5 x2.5 in (Generic) 1 x Per Day/7 Days ary Discharge Instructions: Apply to wound bed as instructed Secondary Dressing: Woven Gauze Sponge, Non-Sterile 4x4 in (Generic) 1 x Per Day/7 Days Discharge Instructions: Apply over primary dressing as directed. Secured With: [employer], 4.5x3.1 (in/yd) (Generic) 1 x Per Day/7 Days Discharge Instructions: Secure with Kerlix as directed. Secured With: Paper T ape, 2x10 (in/yd) (Generic) 1 x Per Day/7 Days Discharge Instructions: Secure dressing with tape as directed. WOUND #2: - T Second Wound Laterality:  Right oe Cleanser: Soap and Water 1 x Per Day/7 Days Discharge Instructions: May shower and wash wound with dial antibacterial soap and water prior to dressing change. Prim Dressing: KerraCel Ag Gelling Fiber Dressing, 2x2 in (silver alginate) 1 x Per Day/7 Days ary Discharge Instructions: Weave in between  toes Secondary Dressing: Woven Gauze Sponges 2x2 in 1 x Per Day/7 Days Discharge Instructions: Apply over primary dressing as directed. Secured With: Insurance underwriter, Sterile 2x75 (in/in) 1 x Per Day/7 Days Discharge Instructions: Secure with stretch gauze as directed. Secured With: Paper T ape, 1x10 (in/yd) 1 x Per Day/7 Days Discharge Instructions: Secure dressing with tape as directed. WOUND #3: - T Third Wound Laterality: Right oe Cleanser: Soap and Water 1 x Per Day/7 Days Discharge Instructions: May shower and wash wound with dial antibacterial soap and water prior to dressing change. Prim Dressing: KerraCel Ag Gelling Fiber Dressing, 2x2 in (silver alginate) 1 x Per Day/7 Days ary Discharge Instructions: Weave in between toes Secondary Dressing: Woven Gauze Sponges 2x2 in 1 x Per Day/7 Days Discharge Instructions: Apply over primary dressing as directed. Secured With: Insurance underwriter, Sterile 2x75 (in/in) 1 x Per Day/7 Days Discharge Instructions: Secure with stretch gauze as directed. Secured With: Paper T ape, 1x10 (in/yd) 1 x Per Day/7 Days Discharge Instructions: Secure dressing with tape as directed. WOUND #4: - T Fourth Wound Laterality: oe Cleanser: Soap and Water 1 x Per Day/7 Days Discharge Instructions: May shower and wash wound with dial antibacterial soap and water prior to dressing change. Prim Dressing: KerraCel Ag Gelling Fiber Dressing, 2x2 in (silver alginate) 1 x Per Day/7 Days ary Discharge Instructions: Weave in between toes Secondary Dressing: Woven Gauze Sponges 2x2 in 1 x Per Day/7 Days Discharge Instructions: Apply over primary dressing as directed. Secured With: Insurance underwriter, Sterile 2x75 (in/in) 1 x Per Day/7 Days Discharge Instructions: Secure with stretch gauze as directed. Secured With: Paper T ape, 1x10 (in/yd) 1 x Per Day/7 Days Discharge Instructions: Secure dressing with tape as directed. 1.  I have continued with Hydrofera Blue to the right lateral foot which appears to be doing well. 2. We are going to apply silver alginate wrapping this on the dorsal part of her toes and also in between her toes 3. I have given her prescription for Keflex 500 every 8. She tells me she has a very queasy stomach with antibiotics [nausea] I given her Zofran 4 mg up to twice daily as needed 4. I have marked the area of erythema on the dorsal foot they will seek urgent medical attention should this expand rapidly Electronic Signature(s) Signed: 12/01/2020 2:54:45 PM By: Baltazar Najjar MD Entered By: Baltazar Najjar on 12/01/2020 14:54:45 -------------------------------------------------------------------------------- SuperBill Details Patient Name: Date of Service: [name] 12/01/2020 Medical Record Number: [id number] Patient Account Number: [id number] Date of Birth/Sex: Treating RN: [birth year] (60 y.o. [name] Primary Care Provider: Juluis Rainier Other Clinician: Referring Provider: Treating Provider/Extender: Arelia Longest in Treatment: 4 Diagnosis Coding ICD-10 Codes Code Description [id number] Type 1 diabetes mellitus with foot ulcer L97.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 severity E10.42 Type 1 diabetes mellitus with diabetic polyneuropathy L03.115 Cellulitis of right lower limb M14.671 Charcot's joint, right ankle and foot Facility Procedures CPT4 Code: [id number] Description: 99214 - WOUND CARE VISIT-LEV 4 EST PT Modifier: Quantity: 1 Physician Procedures : CPT4 Code Description Modifier [id number] - WC PHYS LEVEL 4 -  EST PT ICD-10 Diagnosis Description E10.621 Type 1 diabetes mellitus with foot ulcer L03.115 Cellulitis of right lower limb L97.518 Non-pressure chronic ulcer of other part of right foot  with other specified severity Quantity: 1 Electronic Signature(s) Signed: 12/02/2020 4:50:25  PM By: Baltazar Najjar MD Signed: 12/02/2020 5:50:13 PM By: Zandra Abts RN, BSN Previous Signature: 12/01/2020 3:00:47 PM Version By: Baltazar Najjar MD Entered By: Zandra Abts on 12/01/2020 15:11:27

## 2020-12-02 NOTE — Progress Notes (Signed)
FARHANA, FELLOWS ([id number]) Visit Report for 12/01/2020 Allergy List Details Patient Name: Date of Service: [name], [name] [name] 12/01/2020 2:00 PM Medical Record Number: [id number] Patient Account Number: [id number] Date of Birth/Sex: Treating RN: November 24, 1961 (60 y.o. [name] Primary Care Levante Simones: [name] Other Clinician: Referring Tammala Weider: Treating Maize Brittingham/Extender: [name] in Treatment: 4 Allergies Active Allergies codeine Type: Food Allergy Notes Electronic Signature(s) Signed: 12/02/2020 5:50:13 PM By: Levan Hurst RN, BSN Entered By: Levan Hurst on 12/01/2020 14:33:50 -------------------------------------------------------------------------------- Arrival Information Details Patient Name: Date of Service: [name]. 12/01/2020 2:00 PM Medical Record Number: [id number] Patient Account Number: [id number] Date of Birth/Sex: Treating RN: Dec 01, 1960 (60 y.o. [name] Primary Care Dorathy Stallone: [name] Other Clinician: Referring Jaydien Panepinto: Treating Sharday Michl/Extender: [name] in Treatment: 4 Visit Information Patient Arrived: Wheel Chair Arrival Time: 13:55 Accompanied By: husband Transfer Assistance: None Patient Identification Verified: Yes Secondary Verification Process Completed: Yes Patient Requires Transmission-Based Precautions: No Patient Has Alerts: Yes Patient Alerts: R ABI 1.09 TBI .69 L ABI 1.02 TBI .77 History Since Last Visit Added or deleted any medications: No Any new allergies or adverse reactions: No Had a fall or experienced change in activities of daily living that may affect risk of falls: No Signs or symptoms of abuse/neglect since last visito No Hospitalized since last visit: No Implantable device outside of the clinic excluding cellular tissue based products placed in the center since last visit: No Has Dressing in Place as Prescribed: Yes Pain  Present Now: No Electronic Signature(s) Signed: 12/02/2020 5:50:13 PM By: Levan Hurst RN, BSN Entered By: Levan Hurst on 12/01/2020 13:56:03 -------------------------------------------------------------------------------- Clinic Level of Care Assessment Details Patient Name: Date of Service: [name], [name] C. 12/01/2020 2:00 PM Medical Record Number: [id number] Patient Account Number: [id number] Date of Birth/Sex: Treating RN: [birth year] (60 y.o. [name] Primary Care Akoni Parton: [name] Other Clinician: Referring Chelsey Kimberley: Treating Kitt Ledet/Extender: [name] in Treatment: 4 Clinic Level of Care Assessment Items TOOL 4 Quantity Score X- 1 0 Use when only an EandM is performed on FOLLOW-UP visit ASSESSMENTS - Nursing Assessment / Reassessment X- 1 10 Reassessment of Co-morbidities (includes updates in patient status) X- 1 5 Reassessment of Adherence to Treatment Plan ASSESSMENTS - Wound and Skin A ssessment / Reassessment []  - 0 Simple Wound Assessment / Reassessment - one wound X- 4 5 Complex Wound Assessment / Reassessment - multiple wounds []  - 0 Dermatologic / Skin Assessment (not related to wound area) ASSESSMENTS - Focused Assessment []  - 0 Circumferential Edema Measurements - multi extremities []  - 0 Nutritional Assessment / Counseling / Intervention X- 1 5 Lower Extremity Assessment (monofilament, tuning fork, pulses) []  - 0 Peripheral Arterial Disease Assessment (using hand held doppler) ASSESSMENTS - Ostomy and/or Continence Assessment and Care []  - 0 Incontinence Assessment and Management []  - 0 Ostomy Care Assessment and Management (repouching, etc.) PROCESS - Coordination of Care X - Simple Patient / Family Education for ongoing care 1 15 []  - 0 Complex (extensive) Patient / Family Education for ongoing care X- 1 10 Staff obtains Programmer, systems, Records, T Results / Process Orders est []  - 0 Staff telephones  HHA, Nursing Homes / Clarify orders / etc []  - 0 Routine Transfer to another Facility (non-emergent condition) []  - 0 Routine Hospital Admission (non-emergent condition) []  - 0 New Admissions / Biomedical engineer / Ordering NPWT Apligraf, etc. , []  - 0 Emergency Hospital Admission (emergent condition) X- 1 10  Simple Discharge Coordination []  - 0 Complex (extensive) Discharge Coordination PROCESS - Special Needs []  - 0 Pediatric / Minor Patient Management []  - 0 Isolation Patient Management []  - 0 Hearing / Language / Visual special needs []  - 0 Assessment of Community assistance (transportation, D/C planning, etc.) []  - 0 Additional assistance / Altered mentation []  - 0 Support Surface(s) Assessment (bed, cushion, seat, etc.) INTERVENTIONS - Wound Cleansing / Measurement []  - 0 Simple Wound Cleansing - one wound X- 4 5 Complex Wound Cleansing - multiple wounds X- 1 5 Wound Imaging (photographs - any number of wounds) []  - 0 Wound Tracing (instead of photographs) []  - 0 Simple Wound Measurement - one wound X- 4 5 Complex Wound Measurement - multiple wounds INTERVENTIONS - Wound Dressings []  - 0 Small Wound Dressing one or multiple wounds X- 1 15 Medium Wound Dressing one or multiple wounds []  - 0 Large Wound Dressing one or multiple wounds X- 1 5 Application of Medications - topical []  - 0 Application of Medications - injection INTERVENTIONS - Miscellaneous []  - 0 External ear exam []  - 0 Specimen Collection (cultures, biopsies, blood, body fluids, etc.) []  - 0 Specimen(s) / Culture(s) sent or taken to Lab for analysis []  - 0 Patient Transfer (multiple staff / Civil Service fast streamer / Similar devices) []  - 0 Simple Staple / Suture removal (25 or less) []  - 0 Complex Staple / Suture removal (26 or more) []  - 0 Hypo / Hyperglycemic Management (close monitor of Blood Glucose) []  - 0 Ankle / Brachial Index (ABI) - do not check if billed separately X- 1  5 Vital Signs Has the patient been seen at the hospital within the last three years: Yes Total Score: 145 Level Of Care: New/Established - Level 4 Electronic Signature(s) Signed: 12/02/2020 5:50:13 PM By: Levan Hurst RN, BSN Entered By: Levan Hurst on 12/01/2020 15:11:18 -------------------------------------------------------------------------------- Encounter Discharge Information Details Patient Name: Date of Service: [name], [name] Schmidt. 12/01/2020 2:00 PM Medical Record Number: [id number] Patient Account Number: [id number] Date of Birth/Sex: Treating RN: [birth year] (60 y.o. [name] Primary Care Dusten Ellinwood: [name] Other Clinician: Referring Skie Vitrano: Treating Khadar Monger/Extender: [name] in Treatment: 4 Encounter Discharge Information Items Discharge Condition: Stable Ambulatory Status: Wheelchair Discharge Destination: Home Transportation: Private Auto Accompanied By: spouse Schedule Follow-up Appointment: Yes Clinical Summary of Care: Patient Declined Electronic Signature(s) Signed: 12/02/2020 5:39:46 PM By: Baruch Gouty RN, BSN Entered By: Baruch Gouty on 12/01/2020 14:55:53 -------------------------------------------------------------------------------- Lower Extremity Assessment Details Patient Name: Date of Service: [place], [name] Schmidt. 12/01/2020 2:00 PM Medical Record Number: [id number] Patient Account Number: [id number] Date of Birth/Sex: Treating RN: 1961/04/07 (60 y.o. [name], [name] Primary Care Arcadio Cope: [name] Other Clinician: Referring Charley Lafrance: Treating Kane Kusek/Extender: [name] in Treatment: 4 Edema Assessment Assessed: Shirlyn Goltz: No] Patrice Paradise: Yes] Edema: [Left: N] [Right: o] Calf Left: Right: Point of Measurement: 28 cm From Medial Instep 31 cm Ankle Left: Right: Point of Measurement: 10 cm From Medial Instep 19 cm Vascular Assessment Pulses: Dorsalis  Pedis Palpable: [Right:Yes] Posterior Tibial Palpable: [Right:Yes] Electronic Signature(s) Signed: 12/02/2020 5:38:12 PM By: Rhae Hammock RN Entered By: Rhae Hammock on 12/01/2020 14:28:43 -------------------------------------------------------------------------------- Multi Wound Chart Details Patient Name: Date of Service: [name]. 12/01/2020 2:00 PM Medical Record Number: [id number] Patient Account Number: [id number] Date of Birth/Sex: Treating RN: [birth year] (60 y.o. [name] Primary Care Elyana Grabski: [name] Other Clinician: Referring Randi [employer]: Treating Delisa Finck/Extender: [name] in Treatment: 4  Vital Signs Height(in): 64 Pulse(bpm): 74 Weight(lbs): 165 Blood Pressure(mmHg): 103/69 Body Mass Index(BMI): 28 Temperature(F): 98.2 Respiratory Rate(breaths/min): 16 Photos: [1:No Photos Right, Lateral Foot] [2:No Photos Right T Second oe] [3:No Photos Right T Third oe] Wound Location: [1:Trauma] [2:Blister] [3:Blister] Wounding Event: [1:Diabetic Wound/Ulcer of the Lower] [2:Abrasion] [3:Abrasion] Primary Etiology: [1:Extremity Asthma, Sleep Apnea, Peripheral] [2:Asthma, Sleep Apnea, Peripheral] [3:Asthma, Sleep Apnea, Peripheral] Comorbid History: [1:Venous Disease, Type I Diabetes, Neuropathy 06/03/2020] [2:Venous Disease, Type I Diabetes, Neuropathy 11/27/2020] [3:Venous Disease, Type I Diabetes, Neuropathy 11/27/2020] Date Acquired: [1:4] [2:0] [3:0] Weeks of Treatment: [1:Open] [2:Open] [3:Open] Wound Status: [1:0.4x0.5x0.3] [2:0.5x0.5x0.1] [3:3x3x0.1] Measurements L x W x D (cm) [1:0.157] [2:0.196] [3:7.069] A (cm) : rea [1:0.047] [2:0.02] [3:0.707] Volume (cm) : [1:90.20%] [2:N/A] [3:N/A] % Reduction in A rea: [1:70.60%] [2:N/A] [3:N/A] % Reduction in Volume: [1:7] Starting Position 1 (o'clock): [1:11] Ending Position 1 (o'clock): [1:0.3] Maximum Distance 1 (cm): [1:Yes] [2:No] [3:No] Undermining:  [1:Grade 2] [2:Partial Thickness] [3:Partial Thickness] Classification: [1:Medium] [2:Medium] [3:Medium] Exudate A mount: [1:Serosanguineous] [2:Serosanguineous] [3:Serosanguineous] Exudate Type: [1:red, brown] [2:red, brown] [3:red, brown] Exudate Color: [1:Distinct, outline attached] [2:Distinct, outline attached] [3:Distinct, outline attached] Wound Margin: [1:Large (67-100%)] [2:Medium (34-66%)] [3:Large (67-100%)] Granulation A mount: [1:Red, Hyper-granulation] [2:Red, Pink] [3:Red, Pink] Granulation Quality: [1:Small (1-33%)] [2:Medium (34-66%)] [3:Small (1-33%)] Necrotic A mount: [1:Fat Layer (Subcutaneous Tissue): Yes Fascia: No] [3:Fascia: No] Exposed Structures: [1:Fascia: No Tendon: No Muscle: No Joint: No Bone: No Small (1-33%)] [2:Fat Layer (Subcutaneous Tissue): No Tendon: No Muscle: No Joint: No Bone: No None] [3:Fat Layer (Subcutaneous Tissue): No Tendon: No Muscle: No Joint: No Bone: No None] Wound Number: 4 N/A N/A Photos: No Photos N/A N/A T Fourth oe N/A N/A Wound Location: Blister N/A N/A Wounding Event: Diabetic Wound/Ulcer of the Lower N/A N/A Primary Etiology: Extremity Asthma, Sleep Apnea, Peripheral N/A N/A Comorbid History: Venous Disease, Type I Diabetes, Neuropathy 11/27/2020 N/A N/A Date Acquired: 0 N/A N/A Weeks of Treatment: Open N/A N/A Wound Status: 2.5x2x0.2 N/A N/A Measurements L x W x D (cm) 3.927 N/A N/A A (cm) : rea 0.785 N/A N/A Volume (cm) : 0.00% N/A N/A % Reduction in A rea: 0.00% N/A N/A % Reduction in Volume: No N/A N/A Undermining: Grade 2 N/A N/A Classification: Medium N/A N/A Exudate A mount: Purulent N/A N/A Exudate Type: yellow, brown, green N/A N/A Exudate Color: Distinct, outline attached N/A N/A Wound Margin: Small (1-33%) N/A N/A Granulation A mount: Red, Pink N/A N/A Granulation Quality: Large (67-100%) N/A N/A Necrotic A mount: Fat Layer (Subcutaneous Tissue): Yes N/A N/A Exposed  Structures: Fascia: No Tendon: No Muscle: No Joint: No Bone: No None N/A N/A Epithelialization: Treatment Notes Electronic Signature(s) Signed: 12/01/2020 3:00:47 PM By: Linton Ham MD Signed: 12/02/2020 5:50:13 PM By: Levan Hurst RN, BSN Entered By: Linton Ham on 12/01/2020 14:39:42 -------------------------------------------------------------------------------- Multi-Disciplinary Care Plan Details Patient Name: Date of Service: [name], [place]. 12/01/2020 2:00 PM Medical Record Number: [id number] Patient Account Number: [id number] Date of Birth/Sex: Treating RN: 1960-12-12 (60 y.o. [name] Primary Care Mel Langan: [name] Other Clinician: Referring Katori Wirsing: Treating Kila Godina/Extender: [name] in Treatment: 4 Active Inactive Nutrition Nursing Diagnoses: Impaired glucose control: actual or potential Potential for alteratiion in Nutrition/Potential for imbalanced nutrition Goals: Patient/caregiver agrees to and verbalizes understanding of need to use nutritional supplements and/or vitamins as prescribed Date Initiated: 11/03/2020 Date Inactivated: 12/01/2020 Target Resolution Date: 12/05/2020 Goal Status: Met Patient/caregiver will maintain therapeutic glucose control Date Initiated: 11/03/2020 Target Resolution Date: 01/02/2021 Goal Status:  Active Interventions: Assess HgA1c results as ordered upon admission and as needed Assess patient nutrition upon admission and as needed per policy Provide education on elevated blood sugars and impact on wound healing Provide education on nutrition Treatment Activities: Education provided on Nutrition : 11/03/2020 Notes: Wound/Skin Impairment Nursing Diagnoses: Impaired tissue integrity Knowledge deficit related to ulceration/compromised skin integrity Goals: Patient/caregiver will verbalize understanding of skin care regimen Date Initiated: 11/03/2020 Target Resolution Date:  01/02/2021 Goal Status: Active Ulcer/skin breakdown will have a volume reduction of 30% by week 4 Date Initiated: 11/03/2020 Date Inactivated: 12/01/2020 Target Resolution Date: 12/05/2020 Goal Status: Met Interventions: Assess patient/caregiver ability to obtain necessary supplies Assess patient/caregiver ability to perform ulcer/skin care regimen upon admission and as needed Assess ulceration(s) every visit Provide education on ulcer and skin care Notes: Electronic Signature(s) Signed: 12/02/2020 5:50:13 PM By: Levan Hurst RN, BSN Entered By: Levan Hurst on 12/01/2020 14:48:14 -------------------------------------------------------------------------------- Pain Assessment Details Patient Name: Date of Service: [name]. 12/01/2020 2:00 PM Medical Record Number: [id number] Patient Account Number: [id number] Date of Birth/Sex: Treating RN: 1961-08-15 (60 y.o. [name] Primary Care Gayathri Futrell: [name] Other Clinician: Referring Georjean Toya: Treating Macarena Langseth/Extender: [name] in Treatment: 4 Active Problems Location of Pain Severity and Description of Pain Patient Has Paino No Site Locations Pain Management and Medication Current Pain Management: Electronic Signature(s) Signed: 12/02/2020 5:50:13 PM By: Levan Hurst RN, BSN Entered By: Levan Hurst on 12/01/2020 13:56:31 -------------------------------------------------------------------------------- Patient/Caregiver Education Details Patient Name: Date of Service: [name] 1/3/2022andnbsp2:00 PM Medical Record Number: [id number] Patient Account Number: [id number] Date of Birth/Gender: Treating RN: 07-May-1961 (60 y.o. [name] Primary Care Physician: [name] Other Clinician: Referring Physician: Treating Physician/Extender: [name] in Treatment: 4 Education Assessment Education Provided To: Patient Education  Topics Provided Wound/Skin Impairment: Methods: Explain/Verbal Responses: State content correctly Electronic Signature(s) Signed: 12/02/2020 5:50:13 PM By: Levan Hurst RN, BSN Entered By: Levan Hurst on 12/01/2020 14:51:34 -------------------------------------------------------------------------------- Wound Assessment Details Patient Name: Date of Service: [name] 12/01/2020 2:00 PM Medical Record Number: [id number] Patient Account Number: [id number] Date of Birth/Sex: Treating RN: 1961-01-29 (60 y.o. [name], [name] Primary Care Joliet [name]: [name] Other Clinician: Referring Jaylnn Ullery: Treating Cornesha Radziewicz/Extender: [name] in Treatment: 4 Wound Status Wound Number: 1 Primary Diabetic Wound/Ulcer of the Lower Extremity Etiology: Wound Location: Right, Lateral Foot Wound Open Wounding Event: Trauma Status: Date Acquired: 06/03/2020 Comorbid Asthma, Sleep Apnea, Peripheral Venous Disease, Type I Weeks Of Treatment: 4 History: Diabetes, Neuropathy Clustered Wound: No Wound Measurements Length: (cm) 0.4 Width: (cm) 0.5 Depth: (cm) 0.3 Area: (cm) 0.157 Volume: (cm) 0.047 % Reduction in Area: 90.2% % Reduction in Volume: 70.6% Epithelialization: Small (1-33%) Tunneling: No Undermining: Yes Starting Position (o'clock): 7 Ending Position (o'clock): 11 Maximum Distance: (cm) 0.3 Wound Description Classification: Grade 2 Wound Margin: Distinct, outline attached Exudate Amount: Medium Exudate Type: Serosanguineous Exudate Color: red, brown Foul Odor After Cleansing: No Slough/Fibrino Yes Wound Bed Granulation Amount: Large (67-100%) Exposed Structure Granulation Quality: Red, Hyper-granulation Fascia Exposed: No Necrotic Amount: Small (1-33%) Fat Layer (Subcutaneous Tissue) Exposed: Yes Necrotic Quality: Adherent Slough Tendon Exposed: No Muscle Exposed: No Joint Exposed: No Bone Exposed: No Treatment  Notes Wound #1 (Foot) Wound Laterality: Right, Lateral Cleanser Soap and Water Discharge Instruction: May shower and wash wound with dial antibacterial soap and water prior to dressing change. Peri-Wound Care Topical Primary Dressing Hydrofera Blue Ready Foam, 2.5 x2.5 in Discharge Instruction: Apply  to wound bed as instructed Secondary Dressing Woven Gauze Sponge, Non-Sterile 4x4 in Discharge Instruction: Apply over primary dressing as directed. Secured With [employer], 4.5x3.1 (in/yd) Discharge Instruction: Secure with Kerlix as directed. Paper Tape, 2x10 (in/yd) Discharge Instruction: Secure dressing with tape as directed. Compression Wrap Compression Stockings Add-Ons Electronic Signature(s) Signed: 12/02/2020 5:38:12 PM By: Rhae Hammock RN Entered By: Rhae Hammock on 12/01/2020 14:11:24 -------------------------------------------------------------------------------- Wound Assessment Details Patient Name: Date of Service: [name]. 12/01/2020 2:00 PM Medical Record Number: [id number] Patient Account Number: [id number] Date of Birth/Sex: Treating RN: 09-Nov-1961 (60 y.o. [name], [name] Primary Care Rishik Tubby: [name] Other Clinician: Referring Roseana Rhine: Treating Aurelia Gras/Extender: [name] in Treatment: 4 Wound Status Wound Number: 2 Primary Abrasion Etiology: Wound Location: Right T Second oe Wound Open Wounding Event: Blister Status: Date Acquired: 11/27/2020 Comorbid Asthma, Sleep Apnea, Peripheral Venous Disease, Type I Weeks Of Treatment: 0 History: Diabetes, Neuropathy Clustered Wound: No Wound Measurements Length: (cm) 0.5 Width: (cm) 0.5 Depth: (cm) 0.1 Area: (cm) 0.196 Volume: (cm) 0.02 % Reduction in Area: % Reduction in Volume: Epithelialization: None Tunneling: No Undermining: No Wound Description Classification: Partial Thickness Wound Margin: Distinct, outline  attached Exudate Amount: Medium Exudate Type: Serosanguineous Exudate Color: red, brown Foul Odor After Cleansing: No Slough/Fibrino Yes Wound Bed Granulation Amount: Medium (34-66%) Exposed Structure Granulation Quality: Red, Pink Fascia Exposed: No Necrotic Amount: Medium (34-66%) Fat Layer (Subcutaneous Tissue) Exposed: No Necrotic Quality: Adherent Slough Tendon Exposed: No Muscle Exposed: No Joint Exposed: No Bone Exposed: No Electronic Signature(s) Signed: 12/02/2020 5:38:12 PM By: Rhae Hammock RN Entered By: Rhae Hammock on 12/01/2020 14:17:51 -------------------------------------------------------------------------------- Wound Assessment Details Patient Name: Date of Service: [name], [name] Schmidt. 12/01/2020 2:00 PM Medical Record Number: [id number] Patient Account Number: [id number] Date of Birth/Sex: Treating RN: Jan 10, 1961 (60 y.o. [name], [name] Primary Care Jovani Colquhoun: [name] Other Clinician: Referring Oronde Hallenbeck: Treating Melanie Openshaw/Extender: [name] in Treatment: 4 Wound Status Wound Number: 3 Primary Abrasion Etiology: Wound Location: Right T Third oe Wound Open Wounding Event: Blister Status: Date Acquired: 11/27/2020 Comorbid Asthma, Sleep Apnea, Peripheral Venous Disease, Type I Weeks Of Treatment: 0 History: Diabetes, Neuropathy Clustered Wound: No Wound Measurements Length: (cm) 3 Width: (cm) 3 Depth: (cm) 0.1 Area: (cm) 7.069 Volume: (cm) 0.707 % Reduction in Area: % Reduction in Volume: Epithelialization: None Tunneling: No Undermining: No Wound Description Classification: Partial Thickness Wound Margin: Distinct, outline attached Exudate Amount: Medium Exudate Type: Serosanguineous Exudate Color: red, brown Foul Odor After Cleansing: No Slough/Fibrino Yes Wound Bed Granulation Amount: Large (67-100%) Exposed Structure Granulation Quality: Red, Pink Fascia Exposed: No Necrotic  Amount: Small (1-33%) Fat Layer (Subcutaneous Tissue) Exposed: No Necrotic Quality: Adherent Slough Tendon Exposed: No Muscle Exposed: No Joint Exposed: No Bone Exposed: No Electronic Signature(s) Signed: 12/02/2020 5:38:12 PM By: Rhae Hammock RN Entered By: Rhae Hammock on 12/01/2020 14:23:01 -------------------------------------------------------------------------------- Wound Assessment Details Patient Name: Date of Service: [name], [name] Schmidt. 12/01/2020 2:00 PM Medical Record Number: [id number] Patient Account Number: [id number] Date of Birth/Sex: Treating RN: Sep 28, 1961 (60 y.o. [name], [name] Primary Care Zeno Hickel: [name] Other Clinician: Referring Christopherjame Carnell: Treating Valine Drozdowski/Extender: [name] in Treatment: 4 Wound Status Wound Number: 4 Primary Diabetic Wound/Ulcer of the Lower Extremity Etiology: Wound Location: T Fourth oe Wound Open Wounding Event: Blister Status: Date Acquired: 11/27/2020 Comorbid Asthma, Sleep Apnea, Peripheral Venous Disease, Type I Weeks Of Treatment: 0 History: Diabetes, Neuropathy Clustered Wound: No Wound Measurements Length: (cm) 2.5  Width: (cm) 2 Depth: (cm) 0.2 Area: (cm) 3.927 Volume: (cm) 0.785 Wound Description Classification: Grade 2 Wound Margin: Distinct, outline attached Exudate Amount: Medium Exudate Type: Purulent Exudate Color: yellow, brown, green Foul Odor After Cleansing: No Slough/Fibrino Ye % Reduction in Area: 0% % Reduction in Volume: 0% Epithelialization: None Tunneling: No Undermining: No s Wound Bed Granulation Amount: Small (1-33%) Exposed Structure Granulation Quality: Red, Pink Fascia Exposed: No Necrotic Amount: Large (67-100%) Fat Layer (Subcutaneous Tissue) Exposed: Yes Necrotic Quality: Adherent Slough Tendon Exposed: No Muscle Exposed: No Joint Exposed: No Bone Exposed: No Treatment Notes Wound #4 (Toe Fourth) Cleanser Soap and  Water Discharge Instruction: May shower and wash wound with dial antibacterial soap and water prior to dressing change. Peri-Wound Care Topical Primary Dressing KerraCel Ag Gelling Fiber Dressing, 2x2 in (silver alginate) Discharge Instruction: Weave in between toes Secondary Dressing Woven Gauze Sponges 2x2 in Discharge Instruction: Apply over primary dressing as directed. Secured With Conforming Stretch Gauze Bandage, Sterile 2x75 (in/in) Discharge Instruction: Secure with stretch gauze as directed. Paper Tape, 1x10 (in/yd) Discharge Instruction: Secure dressing with tape as directed. Compression Wrap Compression Stockings Add-Ons Electronic Signature(s) Signed: 12/02/2020 5:38:12 PM By: Rhae Hammock RN Entered By: Rhae Hammock on 12/01/2020 14:25:12 -------------------------------------------------------------------------------- Vitals Details Patient Name: Date of Service: [name], [name] [place] C. 12/01/2020 2:00 PM Medical Record Number: [id number] Patient Account Number: [id number] Date of Birth/Sex: Treating RN: 24-Mar-1961 (60 y.o. [name] Primary Care Case Vassell: [name] Other Clinician: Referring Hershell Brandl: Treating Rosmery Duggin/Extender: [name] in Treatment: 4 Vital Signs Time Taken: 13:56 Temperature (F): 98.2 Height (in): 64 Pulse (bpm): 74 Weight (lbs): 165 Respiratory Rate (breaths/min): 16 Body Mass Index (BMI): 28.3 Blood Pressure (mmHg): 103/69 Reference Range: 80 - 120 mg / dl Electronic Signature(s) Signed: 12/02/2020 5:50:13 PM By: Levan Hurst RN, BSN Entered By: Levan Hurst on 12/01/2020 13:56:21

## 2020-12-08 ENCOUNTER — Other Ambulatory Visit: Payer: Self-pay

## 2020-12-08 ENCOUNTER — Encounter (HOSPITAL_BASED_OUTPATIENT_CLINIC_OR_DEPARTMENT_OTHER): Payer: Managed Care, Other (non HMO) | Admitting: Internal Medicine

## 2020-12-08 DIAGNOSIS — E10621 Type 1 diabetes mellitus with foot ulcer: Secondary | ICD-10-CM | POA: Diagnosis not present

## 2020-12-11 NOTE — Progress Notes (Signed)
[name], [name] ([id number]) Visit Report for 12/08/2020 Arrival Information Details Patient Name: Date of Service: [place], [name] 12/08/2020 9:15 A M Medical Record Number: [id number] Patient Account Number: [id number] Date of Birth/Sex: Treating RN: 1961/01/27 (60 y.o. [name] Primary Care Xan Ingraham: [name] Other Clinician: Referring Manreet Kiernan: Treating Daelynn Blower/Extender: [name] in Treatment: 5 Visit Information History Since Last Visit Added or deleted any medications: No Patient Arrived: Ambulatory Any new allergies or adverse reactions: No Arrival Time: 09:48 Had a fall or experienced change in No Accompanied By: self activities of daily living that may affect Transfer Assistance: None risk of falls: Patient Identification Verified: Yes Signs or symptoms of abuse/neglect since last visito No Secondary Verification Process Completed: Yes Hospitalized since last visit: No Patient Requires Transmission-Based Precautions: No Implantable device outside of the clinic excluding No Patient Has Alerts: Yes cellular tissue based products placed in the center Patient Alerts: R ABI 1.09 TBI .69 since last visit: L ABI 1.02 TBI .33 Has Dressing in Place as Prescribed: Yes Pain Present Now: No Electronic Signature(s) Signed: 12/09/2020 10:39:15 AM By: Sandre Kitty Entered By: Sandre Kitty on 12/08/2020 09:48:56 -------------------------------------------------------------------------------- Clinic Level of Care Assessment Details Patient Name: Date of Service: [name] 12/08/2020 9:15 A M Medical Record Number: [id number] Patient Account Number: [id number] Date of Birth/Sex: Treating RN: 12-21-1960 (60 y.o. [name] Primary Care Melaney Tellefsen: [name] Other Clinician: Referring Chevelle Durr: Treating Niyanna Asch/Extender: [name] in Treatment: 5 Clinic Level of Care Assessment  Items TOOL 4 Quantity Score X- 1 0 Use when only an EandM is performed on FOLLOW-UP visit ASSESSMENTS - Nursing Assessment / Reassessment X- 1 10 Reassessment of Co-morbidities (includes updates in patient status) X- 1 5 Reassessment of Adherence to Treatment Plan ASSESSMENTS - Wound and Skin A ssessment / Reassessment []  - 0 Simple Wound Assessment / Reassessment - one wound X- 4 5 Complex Wound Assessment / Reassessment - multiple wounds []  - 0 Dermatologic / Skin Assessment (not related to wound area) ASSESSMENTS - Focused Assessment []  - 0 Circumferential Edema Measurements - multi extremities []  - 0 Nutritional Assessment / Counseling / Intervention X- 1 5 Lower Extremity Assessment (monofilament, tuning fork, pulses) []  - 0 Peripheral Arterial Disease Assessment (using hand held doppler) ASSESSMENTS - Ostomy and/or Continence Assessment and Care []  - 0 Incontinence Assessment and Management []  - 0 Ostomy Care Assessment and Management (repouching, etc.) PROCESS - Coordination of Care X - Simple Patient / Family Education for ongoing care 1 15 []  - 0 Complex (extensive) Patient / Family Education for ongoing care X- 1 10 Staff obtains Programmer, systems, Records, T Results / Process Orders est []  - 0 Staff telephones HHA, Nursing Homes / Clarify orders / etc []  - 0 Routine Transfer to another Facility (non-emergent condition) []  - 0 Routine Hospital Admission (non-emergent condition) []  - 0 New Admissions / Biomedical engineer / Ordering NPWT Apligraf, etc. , []  - 0 Emergency Hospital Admission (emergent condition) X- 1 10 Simple Discharge Coordination []  - 0 Complex (extensive) Discharge Coordination PROCESS - Special Needs []  - 0 Pediatric / Minor Patient Management []  - 0 Isolation Patient Management []  - 0 Hearing / Language / Visual special needs []  - 0 Assessment of Community assistance (transportation, D/C planning, etc.) []  - 0 Additional  assistance / Altered mentation []  - 0 Support Surface(s) Assessment (bed, cushion, seat, etc.) INTERVENTIONS - Wound Cleansing / Measurement []  - 0 Simple Wound Cleansing - one  wound X- 4 5 Complex Wound Cleansing - multiple wounds X- 1 5 Wound Imaging (photographs - any number of wounds) []  - 0 Wound Tracing (instead of photographs) []  - 0 Simple Wound Measurement - one wound X- 4 5 Complex Wound Measurement - multiple wounds INTERVENTIONS - Wound Dressings []  - 0 Small Wound Dressing one or multiple wounds X- 1 15 Medium Wound Dressing one or multiple wounds []  - 0 Large Wound Dressing one or multiple wounds X- 1 5 Application of Medications - topical []  - 0 Application of Medications - injection INTERVENTIONS - Miscellaneous []  - 0 External ear exam []  - 0 Specimen Collection (cultures, biopsies, blood, body fluids, etc.) []  - 0 Specimen(s) / Culture(s) sent or taken to Lab for analysis []  - 0 Patient Transfer (multiple staff / Civil Service fast streamer / Similar devices) []  - 0 Simple Staple / Suture removal (25 or less) []  - 0 Complex Staple / Suture removal (26 or more) []  - 0 Hypo / Hyperglycemic Management (close monitor of Blood Glucose) []  - 0 Ankle / Brachial Index (ABI) - do not check if billed separately X- 1 5 Vital Signs Has the patient been seen at the hospital within the last three years: Yes Total Score: 145 Level Of Care: New/Established - Level 4 Electronic Signature(s) Signed: 12/08/2020 5:04:19 PM By: Levan Hurst RN, BSN Entered By: Levan Hurst on 12/08/2020 16:17:06 -------------------------------------------------------------------------------- Encounter Discharge Information Details Patient Name: Date of Service: [name]. 12/08/2020 9:15 A M Medical Record Number: [id number] Patient Account Number: [id number] Date of Birth/Sex: Treating RN: [birth year] (60 y.o. [name] Primary Care Yasuo Phimmasone: [name] Other  Clinician: Referring Anahy Esh: Treating Abran Gavigan/Extender: [name] in Treatment: 5 Encounter Discharge Information Items Discharge Condition: Stable Ambulatory Status: Wheelchair Discharge Destination: Home Transportation: Private Auto Accompanied By: husband Schedule Follow-up Appointment: Yes Clinical Summary of Care: Electronic Signature(s) Signed: 12/08/2020 5:17:21 PM By: Deon Pilling Entered By: Deon Pilling on 12/08/2020 12:07:10 -------------------------------------------------------------------------------- Lower Extremity Assessment Details Patient Name: Date of Service: [place], [place] 12/08/2020 9:15 A M Medical Record Number: [id number] Patient Account Number: [id number] Date of Birth/Sex: Treating RN: [birth year] (60 y.o. [name] Primary Care Merelyn Klump: [name] Other Clinician: Referring Kumiko Fishman: Treating Darlisha Kelm/Extender: [name] in Treatment: 5 Edema Assessment Assessed: [Left: No] [Right: No] Edema: [Left: N] [Right: o] Calf Left: Right: Point of Measurement: 28 cm From Medial Instep 31 cm Ankle Left: Right: Point of Measurement: 10 cm From Medial Instep 18.8 cm Vascular Assessment Pulses: Dorsalis Pedis Palpable: [Right:Yes] Electronic Signature(s) Signed: 12/09/2020 6:04:09 PM By: Baruch Gouty RN, BSN Entered By: Baruch Gouty on 12/08/2020 10:03:12 -------------------------------------------------------------------------------- Multi Wound Chart Details Patient Name: Date of Service: [name]. 12/08/2020 9:15 A M Medical Record Number: [id number] Patient Account Number: [id number] Date of Birth/Sex: Treating RN: October 30, 1961 (60 y.o. [name] Primary Care Letisia Schwalb: [name] Other Clinician: Referring Cherree Conerly: Treating Bayan Kushnir/Extender: [name] in Treatment: 5 Vital Signs Height(in): 37 Pulse(bpm):  79 Weight(lbs): 165 Blood Pressure(mmHg): 114/73 Body Mass Index(BMI): 28 Temperature(F): 98.4 Respiratory Rate(breaths/min): 16 Photos: [1:No Photos Right, Lateral Foot] [2:No 28 Right T Second oe] [3:No 40 Right T Third oe] Wound Location: [1:Trauma] [2:Blister] [3:Blister] Wounding Event: [1:Diabetic Wound/Ulcer of the Lower] [2:Diabetic Wound/Ulcer of the Lower] [3:Diabetic Wound/Ulcer of the Lower] Primary Etiology: [1:Extremity Asthma, Sleep Apnea, Peripheral] [2:Extremity Asthma, Sleep Apnea, Peripheral] [3:Extremity Asthma, Sleep Apnea, Peripheral] Comorbid History: [1:Venous Disease, Type I Diabetes, Neuropathy  06/03/2020] [2:Venous Disease, Type I Diabetes, Neuropathy 11/27/2020] [3:Venous Disease, Type I Diabetes, Neuropathy 11/27/2020] Date Acquired: [1:5] [2:1] [3:1] Weeks of Treatment: [1:Open] [2:Open] [3:Open] Wound Status: [1:0.4x0.4x0.1] [2:0.5x0.4x0.1] [3:0.5x0.5x0.1] Measurements L x W x D (cm) [1:0.126] [2:0.157] [3:0.196] A (cm) : rea [1:0.013] [2:0.016] [3:0.02] Volume (cm) : [1:92.10%] [2:19.90%] [3:97.20%] % Reduction in A rea: [1:91.90%] [2:20.00%] [3:97.20%] % Reduction in Volume: [1:Grade 2] [2:Grade 1] [3:Grade 1] Classification: [1:Medium] [2:Small] [3:Medium] Exudate A mount: [1:Serosanguineous] [2:Serosanguineous] [3:Serosanguineous] Exudate Type: [1:red, brown] [2:red, brown] [3:red, brown] Exudate Color: [1:Distinct, outline attached] [2:Flat and Intact] [3:Flat and Intact] Wound Margin: [1:Large (67-100%)] [2:Large (67-100%)] [3:Small (1-33%)] Granulation A mount: [1:Red] [2:Red] [3:Pink] Granulation Quality: [1:None Present (0%)] [2:None Present (0%)] [3:Large (67-100%)] Necrotic A mount: [1:Fat Layer (Subcutaneous Tissue): Yes Fat Layer (Subcutaneous Tissue): Yes Fat Layer (Subcutaneous Tissue): Yes] Exposed Structures: [1:Fascia: No Tendon: No Muscle: No Joint: No Bone: No Small (1-33%)] [2:Fascia: No Tendon: No Muscle: No Joint: No Bone:  No Small (1-33%)] [3:Fascia: No Tendon: No Muscle: No Joint: No Bone: No Small (1-33%)] Epithelialization: [1:N/A] [2:N/A] [3:toe red] Wound Number: 4 N/A N/A Photos: No Photos N/A N/A Right T Fourth oe N/A N/A Wound Location: Blister N/A N/A Wounding Event: Diabetic Wound/Ulcer of the Lower N/A N/A Primary Etiology: Extremity Asthma, Sleep Apnea, Peripheral N/A N/A Comorbid History: Venous Disease, Type I Diabetes, Neuropathy 11/27/2020 N/A N/A Date Acquired: 1 N/A N/A Weeks of Treatment: Open N/A N/A Wound Status: 1x0.6x0.1 N/A N/A Measurements L x W x D (cm) 0.471 N/A N/A A (cm) : rea 0.047 N/A N/A Volume (cm) : 88.00% N/A N/A % Reduction in A rea: 94.00% N/A N/A % Reduction in Volume: Grade 2 N/A N/A Classification: Medium N/A N/A Exudate A mount: Serosanguineous N/A N/A Exudate Type: red, brown N/A N/A Exudate Color: Flat and Intact N/A N/A Wound Margin: Medium (34-66%) N/A N/A Granulation A mount: Red, Pink N/A N/A Granulation Quality: Medium (34-66%) N/A N/A Necrotic A mount: Fat Layer (Subcutaneous Tissue): Yes N/A N/A Exposed Structures: Fascia: No Tendon: No Muscle: No Joint: No Bone: No None N/A N/A Epithelialization: N/A N/A N/A Assessment Notes: Treatment Notes Electronic Signature(s) Signed: 12/08/2020 5:04:19 PM By: Levan Hurst RN, BSN Signed: 12/11/2020 5:01:02 PM By: Linton Ham MD Entered By: Linton Ham on 12/08/2020 10:41:05 -------------------------------------------------------------------------------- Multi-Disciplinary Care Plan Details Patient Name: Date of Service: [place], [place]. 12/08/2020 9:15 A M Medical Record Number: [id number] Patient Account Number: [id number] Date of Birth/Sex: Treating RN: [birth year] (60 y.o. [name] Primary Care Yancarlos Berthold: [name] Other Clinician: Referring Alysabeth Scalia: Treating Annalynn Centanni/Extender: [name] in Treatment: 5 Active  Inactive Nutrition Nursing Diagnoses: Impaired glucose control: actual or potential Potential for alteratiion in Nutrition/Potential for imbalanced nutrition Goals: Patient/caregiver agrees to and verbalizes understanding of need to use nutritional supplements and/or vitamins as prescribed Date Initiated: 11/03/2020 Date Inactivated: 12/01/2020 Target Resolution Date: 12/05/2020 Goal Status: Met Patient/caregiver will maintain therapeutic glucose control Date Initiated: 11/03/2020 Target Resolution Date: 01/02/2021 Goal Status: Active Interventions: Assess HgA1c results as ordered upon admission and as needed Assess patient nutrition upon admission and as needed per policy Provide education on elevated blood sugars and impact on wound healing Provide education on nutrition Treatment Activities: Education provided on Nutrition : 11/03/2020 Notes: Wound/Skin Impairment Nursing Diagnoses: Impaired tissue integrity Knowledge deficit related to ulceration/compromised skin integrity Goals: Patient/caregiver will verbalize understanding of skin care regimen Date Initiated: 11/03/2020 Target Resolution Date: 01/02/2021 Goal Status: Active Ulcer/skin breakdown will have a volume reduction  of 30% by week 4 Date Initiated: 11/03/2020 Date Inactivated: 12/01/2020 Target Resolution Date: 12/05/2020 Goal Status: Met Interventions: Assess patient/caregiver ability to obtain necessary supplies Assess patient/caregiver ability to perform ulcer/skin care regimen upon admission and as needed Assess ulceration(s) every visit Provide education on ulcer and skin care Notes: Electronic Signature(s) Signed: 12/08/2020 5:04:19 PM By: Levan Hurst RN, BSN Entered By: Levan Hurst on 12/08/2020 16:16:23 -------------------------------------------------------------------------------- Pain Assessment Details Patient Name: Date of Service: [name]. 12/08/2020 9:15 A M Medical Record Number:  [id number] Patient Account Number: [id number] Date of Birth/Sex: Treating RN: Jul 25, 1961 (60 y.o. [name] Primary Care Zea Kostka: [name] Other Clinician: Referring Scottlynn Lindell: Treating Marti Acebo/Extender: [name] in Treatment: 5 Active Problems Location of Pain Severity and Description of Pain Patient Has Paino No Site Locations Pain Management and Medication Current Pain Management: Electronic Signature(s) Signed: 12/08/2020 5:04:19 PM By: Levan Hurst RN, BSN Signed: 12/09/2020 10:39:15 AM By: Sandre Kitty Entered By: Sandre Kitty on 12/08/2020 09:49:16 -------------------------------------------------------------------------------- Patient/Caregiver Education Details Patient Name: Date of Service: [name] 1/10/2022andnbsp9:15 A M Medical Record Number: [id number] Patient Account Number: [id number] Date of Birth/Gender: Treating RN: 07-18-1961 (60 y.o. [name] Primary Care Physician: [name] Other Clinician: Referring Physician: Treating Physician/Extender: [name] in Treatment: 5 Education Assessment Education Provided To: Patient Education Topics Provided Nutrition: Methods: Explain/Verbal Responses: State content correctly Wound/Skin Impairment: Methods: Explain/Verbal Responses: State content correctly Electronic Signature(s) Signed: 12/08/2020 5:04:19 PM By: Levan Hurst RN, BSN Entered By: Levan Hurst on 12/08/2020 16:16:36 -------------------------------------------------------------------------------- Wound Assessment Details Patient Name: Date of Service: [name] 12/08/2020 9:15 A M Medical Record Number: [id number] Patient Account Number: [id number] Date of Birth/Sex: Treating RN: 1961/06/18 (60 y.o. [name] Primary Care Coalton Arch: [name] Other Clinician: Referring Froylan Hobby: Treating Caellum Mancil/Extender:  [name] in Treatment: 5 Wound Status Wound Number: 1 Primary Diabetic Wound/Ulcer of the Lower Extremity Etiology: Wound Location: Right, Lateral Foot Wound Open Wounding Event: Trauma Status: Date Acquired: 06/03/2020 Comorbid Asthma, Sleep Apnea, Peripheral Venous Disease, Type I Weeks Of Treatment: 5 History: Diabetes, Neuropathy Clustered Wound: No Photos Photo Uploaded By: Mikeal Hawthorne on 12/10/2020 09:55:18 Wound Measurements Length: (cm) 0.4 Width: (cm) 0.4 Depth: (cm) 0.1 Area: (cm) 0.126 Volume: (cm) 0.013 % Reduction in Area: 92.1% % Reduction in Volume: 91.9% Epithelialization: Small (1-33%) Tunneling: No Undermining: No Wound Description Classification: Grade 2 Wound Margin: Distinct, outline attached Exudate Amount: Medium Exudate Type: Serosanguineous Exudate Color: red, brown Foul Odor After Cleansing: No Slough/Fibrino Yes Wound Bed Granulation Amount: Large (67-100%) Exposed Structure Granulation Quality: Red Fascia Exposed: No Necrotic Amount: None Present (0%) Fat Layer (Subcutaneous Tissue) Exposed: Yes Tendon Exposed: No Muscle Exposed: No Joint Exposed: No Bone Exposed: No Treatment Notes Wound #1 (Foot) Wound Laterality: Right, Lateral Cleanser Peri-Wound Care Topical Primary Dressing Hydrofera Blue Ready Foam, 2.5 x2.5 in Discharge Instruction: Apply to wound bed as instructed Secondary Dressing Woven Gauze Sponge, Non-Sterile 4x4 in Discharge Instruction: Apply over primary dressing as directed. Woven Gauze Sponges 2x2 in Discharge Instruction: Apply over primary dressing as directed. Secured With [employer], 4.5x3.1 (in/yd) Discharge Instruction: Secure with Kerlix as directed. 85M Medipore H Soft Cloth Surgical T 4 x 2 (in/yd) ape Discharge Instruction: Secure dressing with tape as directed. Compression Wrap Compression Stockings Add-Ons Electronic Signature(s) Signed: 12/08/2020  5:04:19 PM By: Levan Hurst RN, BSN Signed: 12/09/2020 6:04:09 PM By: Baruch Gouty RN, BSN Entered By: Johna Roles,  Linda on 12/08/2020 10:04:11 -------------------------------------------------------------------------------- Wound Assessment Details Patient Name: Date of Service: [name] 12/08/2020 9:15 A M Medical Record Number: [id number] Patient Account Number: [id number] Date of Birth/Sex: Treating RN: 23-Feb-1961 (60 y.o. [name] Primary Care Aiza Vollrath: [name] Other Clinician: Referring Nicolemarie Wooley: Treating Kalana Yust/Extender: [name] in Treatment: 5 Wound Status Wound Number: 2 Primary Diabetic Wound/Ulcer of the Lower Extremity Etiology: Wound Location: Right T Second oe Wound Open Wounding Event: Blister Status: Date Acquired: 11/27/2020 Comorbid Asthma, Sleep Apnea, Peripheral Venous Disease, Type I Weeks Of Treatment: 1 History: Diabetes, Neuropathy Clustered Wound: No Photos Wound Measurements Length: (cm) 0.5 Width: (cm) 0.4 Depth: (cm) 0.1 Area: (cm) 0.157 Volume: (cm) 0.016 % Reduction in Area: 19.9% % Reduction in Volume: 20% Epithelialization: Small (1-33%) Tunneling: No Undermining: No Wound Description Classification: Grade 1 Wound Margin: Flat and Intact Exudate Amount: Small Exudate Type: Serosanguineous Exudate Color: red, brown Foul Odor After Cleansing: No Slough/Fibrino Yes Wound Bed Granulation Amount: Large (67-100%) Exposed Structure Granulation Quality: Red Fascia Exposed: No Necrotic Amount: None Present (0%) Fat Layer (Subcutaneous Tissue) Exposed: Yes Tendon Exposed: No Muscle Exposed: No Joint Exposed: No Bone Exposed: No Treatment Notes Wound #2 (Toe Second) Wound Laterality: Right Cleanser Peri-Wound Care Topical Primary Dressing KerraCel Ag Gelling Fiber Dressing, 2x2 in (silver alginate) Discharge Instruction: Apply silver alginate to wound bed as  instructed Secondary Dressing Woven Gauze Sponge, Non-Sterile 4x4 in Discharge Instruction: Apply over primary dressing as directed. Secured With [employer], 4.5x3.1 (in/yd) Discharge Instruction: Secure with Kerlix as directed. Compression Wrap Compression Stockings Add-Ons Electronic Signature(s) Signed: 12/10/2020 4:12:15 PM By: Mikeal Hawthorne EMT/HBOT/SD Signed: 12/10/2020 5:15:42 PM By: Baruch Gouty RN, BSN Previous Signature: 12/09/2020 6:04:09 PM Version By: Baruch Gouty RN, BSN Entered By: Mikeal Hawthorne on 12/10/2020 10:06:05 -------------------------------------------------------------------------------- Wound Assessment Details Patient Name: Date of Service: [place], [name] 12/08/2020 9:15 A M Medical Record Number: [id number] Patient Account Number: [id number] Date of Birth/Sex: Treating RN: 01/20/1961 (60 y.o. [name] Primary Care Redonna Wilbert: [name] Other Clinician: Referring Isadora Delorey: Treating Brigetta Beckstrom/Extender: [name] in Treatment: 5 Wound Status Wound Number: 3 Primary Diabetic Wound/Ulcer of the Lower Extremity Etiology: Wound Location: Right T Third oe Wound Open Wounding Event: Blister Status: Date Acquired: 11/27/2020 Comorbid Asthma, Sleep Apnea, Peripheral Venous Disease, Type I Weeks Of Treatment: 1 History: Diabetes, Neuropathy Clustered Wound: No Photos Photo Uploaded By: Mikeal Hawthorne on 12/10/2020 09:55:48 Wound Measurements Length: (cm) 0.5 Width: (cm) 0.5 Depth: (cm) 0.1 Area: (cm) 0.196 Volume: (cm) 0.02 % Reduction in Area: 97.2% % Reduction in Volume: 97.2% Epithelialization: Small (1-33%) Tunneling: No Undermining: No Wound Description Classification: Grade 1 Wound Margin: Flat and Intact Exudate Amount: Medium Exudate Type: Serosanguineous Exudate Color: red, brown Foul Odor After Cleansing: No Slough/Fibrino Yes Wound Bed Granulation Amount: Small (1-33%)  Exposed Structure Granulation Quality: Pink Fascia Exposed: No Necrotic Amount: Large (67-100%) Fat Layer (Subcutaneous Tissue) Exposed: Yes Necrotic Quality: Adherent Slough Tendon Exposed: No Muscle Exposed: No Joint Exposed: No Bone Exposed: No Assessment Notes toe red Treatment Notes Wound #3 (Toe Third) Wound Laterality: Right Cleanser Peri-Wound Care Topical Primary Dressing KerraCel Ag Gelling Fiber Dressing, 2x2 in (silver alginate) Discharge Instruction: Apply silver alginate to wound bed as instructed Secondary Dressing Woven Gauze Sponges 2x2 in Discharge Instruction: Apply over primary dressing as directed. Secured With [employer], 4.5x3.1 (in/yd) Discharge Instruction: Secure with Kerlix as directed. Compression Wrap Compression Stockings Add-Ons Electronic Signature(s) Signed:  12/08/2020 5:04:19 PM By: Levan Hurst RN, BSN Signed: 12/09/2020 6:04:09 PM By: Baruch Gouty RN, BSN Entered By: Baruch Gouty on 12/08/2020 10:05:11 -------------------------------------------------------------------------------- Wound Assessment Details Patient Name: Date of Service: [name] 12/08/2020 9:15 A M Medical Record Number: [id number] Patient Account Number: [id number] Date of Birth/Sex: Treating RN: 12/22/1960 (61 y.o. [name] Primary Care Juel Bellerose: [name] Other Clinician: Referring Kanyon Seibold: Treating Taneasha Fuqua/Extender: [name] in Treatment: 5 Wound Status Wound Number: 4 Primary Diabetic Wound/Ulcer of the Lower Extremity Etiology: Wound Location: Right T Fourth oe Wound Open Wounding Event: Blister Status: Date Acquired: 11/27/2020 Comorbid Asthma, Sleep Apnea, Peripheral Venous Disease, Type I Weeks Of Treatment: 1 History: Diabetes, Neuropathy Clustered Wound: No Photos Photo Uploaded By: Mikeal Hawthorne on 12/10/2020 09:55:30 Wound Measurements Length: (cm) 1 Width: (cm)  0.6 Depth: (cm) 0.1 Area: (cm) 0.471 Volume: (cm) 0.047 % Reduction in Area: 88% % Reduction in Volume: 94% Epithelialization: None Tunneling: No Undermining: No Wound Description Classification: Grade 2 Wound Margin: Flat and Intact Exudate Amount: Medium Exudate Type: Serosanguineous Exudate Color: red, brown Foul Odor After Cleansing: No Slough/Fibrino Yes Wound Bed Granulation Amount: Medium (34-66%) Exposed Structure Granulation Quality: Red, Pink Fascia Exposed: No Necrotic Amount: Medium (34-66%) Fat Layer (Subcutaneous Tissue) Exposed: Yes Necrotic Quality: Adherent Slough Tendon Exposed: No Muscle Exposed: No Joint Exposed: No Bone Exposed: No Treatment Notes Wound #4 (Toe Fourth) Wound Laterality: Right Cleanser Peri-Wound Care Topical Primary Dressing KerraCel Ag Gelling Fiber Dressing, 2x2 in (silver alginate) Discharge Instruction: Apply silver alginate to wound bed as instructed Secondary Dressing Woven Gauze Sponge, Non-Sterile 4x4 in Discharge Instruction: Apply over primary dressing as directed. Woven Gauze Sponges 2x2 in Discharge Instruction: Apply over primary dressing as directed. Secured With [employer], 4.5x3.1 (in/yd) Discharge Instruction: Secure with Kerlix as directed. Compression Wrap Compression Stockings Add-Ons Electronic Signature(s) Signed: 12/08/2020 5:04:19 PM By: Levan Hurst RN, BSN Signed: 12/09/2020 6:04:09 PM By: Baruch Gouty RN, BSN Entered By: Baruch Gouty on 12/08/2020 10:05:47 -------------------------------------------------------------------------------- Vitals Details Patient Name: Date of Service: [name]. 12/08/2020 9:15 A M Medical Record Number: [id number] Patient Account Number: [id number] Date of Birth/Sex: Treating RN: 27-May-1961 (60 y.o. [name] Primary Care [place] Noyce: [name] Other Clinician: Referring Tamasha Laplante: Treating Lindora Alviar/Extender: [name] in Treatment: 5 Vital Signs Time Taken: 09:48 Temperature (F): 98.4 Height (in): 64 Pulse (bpm): 80 Weight (lbs): 165 Respiratory Rate (breaths/min): 16 Body Mass Index (BMI): 28.3 Blood Pressure (mmHg): 114/73 Reference Range: 80 - 120 mg / dl Electronic Signature(s) Signed: 12/09/2020 10:39:15 AM By: Sandre Kitty Entered By: Sandre Kitty on 12/08/2020 09:49:10

## 2020-12-11 NOTE — Progress Notes (Signed)
[name], [name] C. ([id number]) Visit Report for 12/08/2020 HPI Details Patient Name: Date of Service: [name], [name] 12/08/2020 9:15 A M Medical Record Number: [id number] Patient Account Number: [id number] Date of Birth/Sex: Treating RN: 02/28/1961 (60 y.o. [name]) [name], [name] Primary Care Provider: Juluis [name], [name] Other Clinician: Referring Provider: Treating Provider/Extender: [name], [name], [name] in Treatment: 5 History of Present Illness HPI Description: ADMISSION 11/03/2020 this is a 60 year old woman with type 2 diabetes. She has severe peripheral neuropathy and autonomic neuropathy. 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 Her problem started at the beginning of July her husband noted a bleeding area in her sock. Unfortunately this became infected requiring admission to hospital from 7/12 through 06/13/2020. An MRI was negative for osteo she received IV antibiotics and was discharged on doxycycline. She did have standard arterial ABIs and TBI's but I did not see any waveforms. Fortunately the ABIs and TBI's were normal at 1.09 and 0.69 on the right and 1.01 and 1.02 on the left respectively. Her last visit with [name] of podiatry was on 07/04/2020. At 1 which time the measurements were 1.1 x 0.8 x 0.3 she was using a dry dressing. She went on to see [name] at [facility] on 8/30 at which time the wound was debrided and they were prescribed Santyl. The wound is actually on the lateral foot over the base of the fifth metatarsal. She had an additional wound on her right heel but this closed over. They are currently soaking the foot in Epson salts and using Santyl and they have been doing this for quite a period of time. Past medical history includes type 1 diabetes managed with an insulin pump, movement disorder, hypothyroidism, obstructive sleep apnea. She has a cam boot and she wears this religiously  spending most of her day currently in bed but she needs the boot on to get up to use her bedside commode ABIs were done during the hospitalization in July that showed the numbers quoted above 12/13; right lateral foot which is in the setting of an inverted ankle and likely to be this patient's full weightbearing surface. There is some eschar and slough around the wound circumference but in general the wound surface does not look too bad. She has new diabetic shoes and a brace for when this heals hopefully this will maintain skin integrity. Other than that I think she would need an attempt at surgery if a procedure is available to try and correct this weightbearing to form 12/20; right lateral foot in the setting of a severely inverted ankle. The patient is minimally ambulatory at this point. The areas over the right fifth metatarsal base. 12/01/2020; her right lateral foot wound looks a lot better however she comes in today with full de-epithelialized over the right second third and fourth toes with intense erythema and the erythema is spreading into the dorsal foot. Her husband is able to show me pictures which seems to start around Christmas day. She is insensate and therefore does not feel pain. She has not had any systemic symptoms including fever chills. Her CBGs have been in the usual range for her. She has an allergy to penicillin based on a childhood issue but they tell me she has had ampicillin or amoxicillin since then and tolerated it well other than the fact that most antibiotics seem to cause her nausea 1/10; right lateral foot continues to improve. The area of  cellulitis on the right dorsal foot and the erythema on the second third and fourth toes caused me to put her on empiric cephalexin last time. There was nothing to culture. T oday she comes in with erythema receded from the dorsal foot but still markedly erythematous second third and fourth toes. She has wounds especially between  the third and fourth but also an area between the second and third. She also has what looks to be a paronychia a developing on the right medial first toe Electronic Signature(s) Signed: 12/11/2020 5:01:02 PM By: [name] Najjar MD Entered By: [name] on 12/08/2020 10:42:42 -------------------------------------------------------------------------------- Physical Exam Details Patient Name: Date of Service: [name]. 12/08/2020 9:15 A M Medical Record Number: [id number] Patient Account Number: [id number] Date of Birth/Sex: Treating RN: May 18, 1961 (60 y.o. [name] Primary Care Provider: Juluis [name] Other Clinician: Referring Provider: Treating Provider/Extender: [name] in Treatment: 5 Constitutional Sitting or standing Blood Pressure is within target range for patient.. Pulse regular and within target range for patient.Marland Kitchen Respirations regular, non-labored and within target range.. Temperature is normal and within the target range for the patient.Marland Kitchen Appears in no distress. Notes Wound exam; lateral part of the fifth metatarsal base. This continues to be receding. Healthy looking wound surface Erythema has reduced from the dorsal foot. Still markedly erythematous second third and fourth toes. The wounds that are worse are between the third and fourth toes also an area on the second toe. This is not as good as I was hoping. She has a paronychia on the right fourth toe. Electronic Signature(s) Signed: 12/11/2020 5:01:02 PM By: [name] Najjar MD Entered By: [name] on 12/08/2020 10:43:53 -------------------------------------------------------------------------------- Physician Orders Details Patient Name: Date of Service: [name] 12/08/2020 9:15 A M Medical Record Number: [id number] Patient Account Number: [id number] Date of Birth/Sex: Treating RN: 1961/02/09 (60 y.o. [name] Primary Care Provider: Juluis [name] Other Clinician: Referring Provider: Treating Provider/Extender: [name] in Treatment: 5 Verbal / Phone Orders: No Diagnosis Coding ICD-10 Coding Code Description E10.621 Type 1 diabetes mellitus with foot ulcer L97.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 severity E10.42 Type 1 diabetes mellitus with diabetic polyneuropathy L03.115 Cellulitis of right lower limb M14.671 Charcot's joint, right ankle and foot Wound Treatment Patient Medications llergies: codeine A Notifications Medication Indication Start End celllulitis right foot 12/08/2020 cephalexin DOSE oral 500 mg capsule - 1 capsule oral q6h to continute for an additional 3 days (continuing Rx) Electronic Signature(s) Signed: 12/08/2020 10:40:02 AM By: [name] Najjar MD Entered By: [name] on 12/08/2020 10:40:01 -------------------------------------------------------------------------------- Problem List Details Patient Name: Date of Service: [name]. 12/08/2020 9:15 A M Medical Record Number: [id number] Patient Account Number: [id number] Date of Birth/Sex: Treating RN: [birth year] (60 y.o. [name] Primary Care Provider: Juluis [name] Other Clinician: Referring Provider: Treating Provider/Extender: [name] in Treatment: 5 Active Problems ICD-10 Encounter Code Description Active Date MDM Diagnosis E10.621 Type 1 diabetes mellitus with foot ulcer 11/03/2020 No Yes L97.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 11/03/2020 No Yes severity E10.42 Type 1 diabetes mellitus with diabetic polyneuropathy 11/03/2020 No Yes L03.115 Cellulitis of right lower limb 12/01/2020 No Yes M14.671 Charcot's joint, right ankle and foot 11/03/2020 No Yes Inactive Problems Resolved Problems Electronic Signature(s) Signed: 12/11/2020 5:01:02 PM By: [name] Najjar  MD Entered By: [name] on 12/08/2020 10:40:48 -------------------------------------------------------------------------------- Progress  Note Details Patient Name: Date of Service: [name] SprinklesSNYDER, MA RY C. 12/08/2020 9:15 A M Medical Record Number: [id number] Patient Account Number: [id number] Date of Birth/Sex: Treating RN: 04/01/1961 (60 y.o. [name]) [name], [name] Primary Care Provider: Juluis [name], [name] Other Clinician: Referring Provider: Treating Provider/Extender: [name], Aislynn Cifelli [name], [name] in Treatment: 5 Subjective History of Present Illness (HPI) ADMISSION 11/03/2020 this is a 60 year old woman with type 2 diabetes. She has severe peripheral neuropathy and autonomic neuropathy. 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 Her problem started at the beginning of July her husband noted a bleeding area in her sock. Unfortunately this became infected requiring admission to hospital from 7/12 through 06/13/2020. An MRI was negative for osteo she received IV antibiotics and was discharged on doxycycline. She did have standard arterial ABIs and TBI's but I did not see any waveforms. Fortunately the ABIs and TBI's were normal at 1.09 and 0.69 on the right and 1.01 and 1.02 on the left respectively. Her last visit with [name] of podiatry was on 07/04/2020. At 1 which time the measurements were 1.1 x 0.8 x 0.3 she was using a dry dressing. She went on to see [name] at [facility] on 8/30 at which time the wound was debrided and they were prescribed Santyl. The wound is actually on the lateral foot over the base of the fifth metatarsal. She had an additional wound on her right heel but this closed over. They are currently soaking the foot in Epson salts and using Santyl and they have been doing this for quite a period of time. Past medical history includes type 1 diabetes managed with an insulin pump, movement disorder,  hypothyroidism, obstructive sleep apnea. She has a cam boot and she wears this religiously spending most of her day currently in bed but she needs the boot on to get up to use her bedside commode ABIs were done during the hospitalization in July that showed the numbers quoted above 12/13; right lateral foot which is in the setting of an inverted ankle and likely to be this patient's full weightbearing surface. There is some eschar and slough around the wound circumference but in general the wound surface does not look too bad. She has new diabetic shoes and a brace for when this heals hopefully this will maintain skin integrity. Other than that I think she would need an attempt at surgery if a procedure is available to try and correct this weightbearing to form 12/20; right lateral foot in the setting of a severely inverted ankle. The patient is minimally ambulatory at this point. The areas over the right fifth metatarsal base. 12/01/2020; her right lateral foot wound looks a lot better however she comes in today with full de-epithelialized over the right second third and fourth toes with intense erythema and the erythema is spreading into the dorsal foot. Her husband is able to show me pictures which seems to start around Christmas day. She is insensate and therefore does not feel pain. She has not had any systemic symptoms including fever chills. Her CBGs have been in the usual range for her. She has an allergy to penicillin based on a childhood issue but they tell me she has had ampicillin or amoxicillin since then and tolerated it well other than the fact that most antibiotics seem to cause her nausea 1/10; right lateral foot continues to improve. ooThe area of cellulitis on the right dorsal foot and the  erythema on the second third and fourth toes caused me to put her on empiric cephalexin last time. There was nothing to culture. T oday she comes in with erythema receded from the dorsal foot but  still markedly erythematous second third and fourth toes. She has wounds especially between the third and fourth but also an area between the second and third. She also has what looks to be a paronychia a developing on the right medial first toe Objective Constitutional Sitting or standing Blood Pressure is within target range for patient.. Pulse regular and within target range for patient.Marland Kitchen Respirations regular, non-labored and within target range.. Temperature is normal and within the target range for the patient.Marland Kitchen Appears in no distress. Vitals Time Taken: 9:48 AM, Height: 64 in, Weight: 165 lbs, BMI: 28.3, Temperature: 98.4 F, Pulse: 80 bpm, Respiratory Rate: 16 breaths/min, Blood Pressure: 114/73 mmHg. General Notes: Wound exam; lateral part of the fifth metatarsal base. This continues to be receding. Healthy looking wound surface ooErythema has reduced from the dorsal foot. Still markedly erythematous second third and fourth toes. The wounds that are worse are between the third and fourth toes also an area on the second toe. This is not as good as I was hoping. ooShe has a paronychia on the right fourth toe. Integumentary (Hair, Skin) Wound #1 status is Open. Original cause of wound was Trauma. The wound is located on the Right,Lateral Foot. The wound measures 0.4cm length x 0.4cm width x 0.1cm depth; 0.126cm^2 area and 0.013cm^3 volume. There is Fat Layer (Subcutaneous Tissue) exposed. There is no tunneling or undermining noted. There is a medium amount of serosanguineous drainage noted. The wound margin is distinct with the outline attached to the wound base. There is large (67- 100%) red granulation within the wound bed. There is no necrotic tissue within the wound bed. Wound #2 status is Open. Original cause of wound was Blister. The wound is located on the Right T Second. The wound measures 0.5cm length x 0.4cm oe width x 0.1cm depth; 0.157cm^2 area and 0.016cm^3 volume. There is  Fat Layer (Subcutaneous Tissue) exposed. There is no tunneling or undermining noted. There is a small amount of serosanguineous drainage noted. The wound margin is flat and intact. There is large (67-100%) red granulation within the wound bed. There is no necrotic tissue within the wound bed. Wound #3 status is Open. Original cause of wound was Blister. The wound is located on the Right T Third. The wound measures 0.5cm length x 0.5cm width x oe 0.1cm depth; 0.196cm^2 area and 0.02cm^3 volume. There is Fat Layer (Subcutaneous Tissue) exposed. There is no tunneling or undermining noted. There is a medium amount of serosanguineous drainage noted. The wound margin is flat and intact. There is small (1-33%) pink granulation within the wound bed. There is a large (67-100%) amount of necrotic tissue within the wound bed including Adherent Slough. General Notes: toe red Wound #4 status is Open. Original cause of wound was Blister. The wound is located on the Right T Fourth. The wound measures 1cm length x 0.6cm width x oe 0.1cm depth; 0.471cm^2 area and 0.047cm^3 volume. There is Fat Layer (Subcutaneous Tissue) exposed. There is no tunneling or undermining noted. There is a medium amount of serosanguineous drainage noted. The wound margin is flat and intact. There is medium (34-66%) red, pink granulation within the wound bed. There is a medium (34-66%) amount of necrotic tissue within the wound bed including Adherent Slough. Assessment Active Problems ICD-10 Type 1  diabetes mellitus with foot ulcer Non-pressure chronic ulcer of other part of right foot with other specified severity Type 1 diabetes mellitus with diabetic polyneuropathy Cellulitis of right lower limb Charcot's joint, right ankle and foot Plan The following medication(s) was prescribed: cephalexin oral 500 mg capsule 1 capsule oral q6h to continute for an additional 3 days (continuing Rx) for celllulitis right foot starting  12/08/2020 I extend1. Cephalexin 1 tablet every 6 hours for an additional 3 days 2. Silver alginate wrapped around the second third and fourth toes and in between 3. The original wound on the right lateral foot looks a lot better. 4. I am somewhat concerned about the erythema in the toes I was hoping that that would be better. The erythema is on the dorsal foot however however it has receded quite a bit. Electronic Signature(s) Signed: 12/11/2020 5:01:02 PM By: [name] Najjar MD Entered By: [name] on 12/08/2020 10:44:55 -------------------------------------------------------------------------------- SuperBill Details Patient Name: Date of Service: [name] 12/08/2020 Medical Record Number: [id number] Patient Account Number: [id number] Date of Birth/Sex: Treating RN: [birth year] (60 y.o. Dorthula Perfect, Emeterio Reeve Primary Care Provider: Juluis [name] Other Clinician: Referring Provider: Treating Provider/Extender: [name] in Treatment: 5 Diagnosis Coding ICD-10 Codes Code Description [phone] Type 1 diabetes mellitus with foot ulcer L97.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 severity E10.42 Type 1 diabetes mellitus with diabetic polyneuropathy L03.115 Cellulitis of right lower limb M14.671 Charcot's joint, right ankle and foot Facility Procedures CPT4 Code: [id number] Description: 99214 - WOUND CARE VISIT-LEV 4 EST PT Modifier: Quantity: 1 Physician Procedures : CPT4 Code Description Modifier [id number] - WC PHYS LEVEL 3 - EST PT ICD-10 Diagnosis Description L97.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 severity E10.621 Type 1 diabetes mellitus with foot ulcer L03.115  Cellulitis of right lower limb Quantity: 1 Electronic Signature(s) Signed: 12/08/2020 5:04:19 PM By: Zandra Abts RN, BSN Signed: 12/11/2020 5:01:02 PM By: [name] Najjar MD Entered By: Zandra Abts on 12/08/2020 16:17:13

## 2020-12-15 ENCOUNTER — Encounter (HOSPITAL_BASED_OUTPATIENT_CLINIC_OR_DEPARTMENT_OTHER): Payer: Managed Care, Other (non HMO) | Admitting: Internal Medicine

## 2020-12-16 ENCOUNTER — Other Ambulatory Visit: Payer: Self-pay

## 2020-12-16 ENCOUNTER — Encounter (HOSPITAL_BASED_OUTPATIENT_CLINIC_OR_DEPARTMENT_OTHER): Payer: Managed Care, Other (non HMO) | Admitting: Internal Medicine

## 2020-12-16 DIAGNOSIS — E10621 Type 1 diabetes mellitus with foot ulcer: Secondary | ICD-10-CM | POA: Diagnosis not present

## 2020-12-17 NOTE — Progress Notes (Signed)
[name] ([id number]) Visit Report for 12/16/2020 Debridement Details Patient Name: Date of Service: [name], [name] [name] 12/16/2020 1:45 PM Medical Record Number: [id number] Patient Account Number: [id number] Date of Birth/Sex: Treating RN: May 10, 1961 (60 y.o. [name], [name] Primary Care Provider: Juluis Rainier Other Clinician: Referring Provider: Treating Provider/Extender: Arelia Longest in Treatment: 6 Debridement Performed for Assessment: Wound #4 Right T Fourth oe Performed By: Physician Maxwell Caul., MD Debridement Type: Debridement Severity of Tissue Pre Debridement: Bone involvement without necrosis Level of Consciousness (Pre-procedure): Awake and Alert Pre-procedure Verification/Time Out Yes - 14:14 Taken: Start Time: 14:14 Pain Control: Other : Benzocaine T Area Debrided (L x W): otal 1.5 (cm) x 0.8 (cm) = 1.2 (cm) Tissue and other material debrided: Viable, Non-Viable, Bone, Slough, Subcutaneous, Skin: Dermis , Skin: Epidermis, Slough Level: Skin/Subcutaneous Tissue/Muscle/Bone Debridement Description: Excisional Instrument: Curette Specimen: Tissue Culture Number of Specimens T aken: 1 Bleeding: Moderate Hemostasis Achieved: Silver Nitrate End Time: 14:15 Procedural Pain: 0 Post Procedural Pain: 0 Response to Treatment: Procedure was tolerated well Level of Consciousness (Post- Awake and Alert procedure): Post Debridement Measurements of Total Wound Length: (cm) 1.5 Width: (cm) 0.8 Depth: (cm) 0.4 Volume: (cm) 0.377 Character of Wound/Ulcer Post Debridement: Improved Severity of Tissue Post Debridement: Bone involvement without necrosis Post Procedure Diagnosis Same as Pre-procedure Electronic Signature(s) Signed: 12/16/2020 5:23:51 PM By: Baltazar Najjar MD Signed: 12/17/2020 5:25:13 PM By: Fonnie Mu RN Entered By: Baltazar Najjar on 12/16/2020  14:52:12 -------------------------------------------------------------------------------- HPI Details Patient Name: Date of Service: [name]. 12/16/2020 1:45 PM Medical Record Number: [id number] Patient Account Number: [id number] Date of Birth/Sex: Treating RN: Jun 12, 1961 (60 y.o. [name], [name] Primary Care Provider: Juluis Rainier Other Clinician: Referring Provider: Treating Provider/Extender: Arelia Longest in Treatment: 6 History of Present Illness HPI Description: ADMISSION 11/03/2020 this is a 60 year old woman with type 2 diabetes. She has severe peripheral neuropathy and autonomic neuropathy. 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 Her problem started at the beginning of July her husband noted a bleeding area in her sock. Unfortunately this became infected requiring admission to hospital from 7/12 through 06/13/2020. An MRI was negative for osteo she received IV antibiotics and was discharged on doxycycline. She did have standard arterial ABIs and TBI's but I did not see any waveforms. Fortunately the ABIs and TBI's were normal at 1.09 and 0.69 on the right and 1.01 and 1.02 on the left respectively. Her last visit with Dr. Allena Katz of podiatry was on 07/04/2020. At 1 which time the measurements were 1.1 x 0.8 x 0.3 she was using a dry dressing. She went on to see Dr. Grandville S[facility] at which time the wound was debrided and they were prescribed Santyl. The wound is actually on the lateral foot over the base of the fifth metatarsal. She had an additional wound on her right heel but this closed over. They are currently soaking the foot in Epson salts and using Santyl and they have been doing this for quite a period of time. Past medical history includes type 1 diabetes managed with an insulin pump, movement disorder, hypothyroidism, obstructive sleep apnea. She has a cam boot  and she wears this religiously spending most of her day currently in bed but she needs the boot on to get up to use her bedside commode ABIs were done during the hospitalization in July that showed the numbers  quoted above 12/13; right lateral foot which is in the setting of an inverted ankle and likely to be this patient's full weightbearing surface. There is some eschar and slough around the wound circumference but in general the wound surface does not look too bad. She has new diabetic shoes and a brace for when this heals hopefully this will maintain skin integrity. Other than that I think she would need an attempt at surgery if a procedure is available to try and correct this weightbearing to form 12/20; right lateral foot in the setting of a severely inverted ankle. The patient is minimally ambulatory at this point. The areas over the right fifth metatarsal base. 12/01/2020; her right lateral foot wound looks a lot better however she comes in today with full de-epithelialized over the right second third and fourth toes with intense erythema and the erythema is spreading into the dorsal foot. Her husband is able to show me pictures which seems to start around Christmas day. She is insensate and therefore does not feel pain. She has not had any systemic symptoms including fever chills. Her CBGs have been in the usual range for her. She has an allergy to penicillin based on a childhood issue but they tell me she has had ampicillin or amoxicillin since then and tolerated it well other than the fact that most antibiotics seem to cause her nausea 1/10; right lateral foot continues to improve. The area of cellulitis on the right dorsal foot and the erythema on the second third and fourth toes caused me to put her on empiric cephalexin last time. There was nothing to culture. T oday she comes in with erythema receded from the dorsal foot but still markedly erythematous second third and fourth toes. She  has wounds especially between the third and fourth but also an area between the second and third. She also has what looks to be a paronychia a developing on the right medial first toe 1/18; her original wound on the right lateral foot looks about the same somewhat dry. While we are taking care of this she developed intense cellulitis involving the second third and fourth toes spreading into the forefoot. She had 10 days of cephalexin and things have generally improved quite a bit and the second and third toes dorsal foot but not the fourth toe. She has a substantial wound on the fourth toe medially with now exposed bone. We have been using silver alginate here. Hydrofera Blue to the lateral foot Electronic Signature(s) Signed: 12/16/2020 5:23:51 PM By: Baltazar Najjarobson, Alberto Schoch MD Entered By: Baltazar Najjarobson, Wenda Vanschaick on 12/16/2020 14:53:33 -------------------------------------------------------------------------------- Physical Exam Details Patient Name: Date of Service: [name] SprinklesSNYDER, [name] 12/16/2020 1:45 PM Medical Record Number: [id number] Patient Account Number: [id number] Date of Birth/Sex: Treating RN: 07/22/1961 (60 y.o. Ardis RowanF) Breedlove, [name] Primary Care Provider: Juluis RainierBarnes, Elizabeth Other Clinician: Referring Provider: Treating Provider/Extender: Arelia Longestobson, Evee Liska Barnes, Elizabeth Weeks in Treatment: 6 Constitutional Sitting or standing Blood Pressure is within target range for patient.. Pulse regular and within target range for patient.Marland Kitchen. Respirations regular, non-labored and within target range.. Temperature is normal and within the target range for the patient.Marland Kitchen. Appears in no distress. Notes Wound exam The area on the right lateral foot looks about the same somewhat dry. There is less erythema in the right forefoot second and third toes. Substantial area now on the medial fourth toe. There is area of exposed bone which I removed with a #3 curette then I did a deep tissue culture and bone scraping for PCR  culture The area on the lateral third toe and the second toe are about the same. Electronic Signature(s) Signed: 12/16/2020 5:23:51 PM By: Baltazar Najjar MD Entered By: Baltazar Najjar on 12/16/2020 14:58:28 -------------------------------------------------------------------------------- Physician Orders Details Patient Name: Date of Service: [name] 12/16/2020 1:45 PM Medical Record Number: [id number] Patient Account Number: [id number] Date of Birth/Sex: Treating RN: [birth year] (60 y.o. [name], [name] Primary Care Provider: Juluis Rainier Other Clinician: Referring Provider: Treating Provider/Extender: Arelia Longest in Treatment: 6 Verbal / Phone Orders: No Diagnosis Coding Follow-up Appointments Return Appointment in 1 week. Bathing/ Shower/ Hygiene May shower and wash wound with soap and water. - on days that dressing is changed Off-Loading Removable cast walker boot to: - right foot Other: - Minimal weight bearing on right foot Wound Treatment Wound #1 - Foot Wound Laterality: Right, Lateral Cleanser: Soap and Water 1 x Per Day/7 Days Discharge Instructions: May shower and wash wound with dial antibacterial soap and water prior to dressing change. Prim Dressing: Hydrofera Blue Ready Foam, 2.5 x2.5 in (Generic) 1 x Per Day/7 Days ary Discharge Instructions: Apply to wound bed as instructed Secondary Dressing: Woven Gauze Sponge, Non-Sterile 4x4 in (Generic) 1 x Per Day/7 Days Discharge Instructions: Apply over primary dressing as directed. Secured With: [employer], 4.5x3.1 (in/yd) (Generic) 1 x Per Day/7 Days Discharge Instructions: Secure with Kerlix as directed. Secured With: Paper Tape, 2x10 (in/yd) (Generic) 1 x Per Day/7 Days Discharge Instructions: Secure dressing with tape as directed. Wound #2 - T Second oe Wound Laterality: Right Cleanser: Soap and Water 1 x Per Day/7 Days Discharge Instructions: May shower and  wash wound with dial antibacterial soap and water prior to dressing change. Prim Dressing: KerraCel Ag Gelling Fiber Dressing, 2x2 in (silver alginate) (DME) (Generic) 1 x Per Day/7 Days ary Discharge Instructions: Weave in between toes Secondary Dressing: Woven Gauze Sponges 2x2 in 1 x Per Day/7 Days Discharge Instructions: Apply over primary dressing as directed. Secured With: Insurance underwriter, Sterile 2x75 (in/in) 1 x Per Day/7 Days Discharge Instructions: Secure with stretch gauze as directed. Secured With: Paper Tape, 1x10 (in/yd) 1 x Per Day/7 Days Discharge Instructions: Secure dressing with tape as directed. Wound #3 - T Third oe Wound Laterality: Right Cleanser: Soap and Water 1 x Per Day/7 Days Discharge Instructions: May shower and wash wound with dial antibacterial soap and water prior to dressing change. Prim Dressing: KerraCel Ag Gelling Fiber Dressing, 2x2 in (silver alginate) (DME) (Generic) 1 x Per Day/7 Days ary Discharge Instructions: Weave in between toes Secondary Dressing: Woven Gauze Sponges 2x2 in 1 x Per Day/7 Days Discharge Instructions: Apply over primary dressing as directed. Secured With: Insurance underwriter, Sterile 2x75 (in/in) 1 x Per Day/7 Days Discharge Instructions: Secure with stretch gauze as directed. Secured With: Paper Tape, 1x10 (in/yd) 1 x Per Day/7 Days Discharge Instructions: Secure dressing with tape as directed. Wound #4 - T Fourth oe Wound Laterality: Right Cleanser: Soap and Water 1 x Per Day/7 Days Discharge Instructions: May shower and wash wound with dial antibacterial soap and water prior to dressing change. Prim Dressing: KerraCel Ag Gelling Fiber Dressing, 2x2 in (silver alginate) (DME) (Generic) 1 x Per Day/7 Days ary Discharge Instructions: Weave in between toes Secondary Dressing: Woven Gauze Sponges 2x2 in 1 x Per Day/7 Days Discharge Instructions: Apply over primary dressing as directed. Secured  With: Insurance underwriter, Sterile 2x75 (in/in) 1 x Per Day/7 Days Discharge Instructions: Secure  with stretch gauze as directed. Secured With: Paper Tape, 1x10 (in/yd) 1 x Per Day/7 Days Discharge Instructions: Secure dressing with tape as directed. Laboratory naerobe culture (MICRO) - PCR culture right 4th toe Bacteria identified in Unspecified specimen by A LOINC Code: 635-3 Convenience Name: Anerobic culture Electronic Signature(s) Signed: 12/16/2020 5:23:51 PM By: Baltazar Najjar MD Signed: 12/17/2020 5:25:13 PM By: Fonnie Mu RN Entered By: Fonnie Mu on 12/16/2020 14:30:35 -------------------------------------------------------------------------------- Problem List Details Patient Name: Date of Service: [name] 12/16/2020 1:45 PM Medical Record Number: [id number] Patient Account Number: [id number] Date of Birth/Sex: Treating RN: [birth year] (60 y.o. [name], [name] Primary Care Provider: Juluis Rainier Other Clinician: Referring Provider: Treating Provider/Extender: Arelia Longest in Treatment: 6 Active Problems ICD-10 Encounter Code Description Active Date MDM Diagnosis E10.621 Type 1 diabetes mellitus with foot ulcer 11/03/2020 No Yes L97.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 11/03/2020 No Yes severity E10.42 Type 1 diabetes mellitus with diabetic polyneuropathy 11/03/2020 No Yes L03.115 Cellulitis of right lower limb 12/01/2020 No Yes M14.671 Charcot's joint, right ankle and foot 11/03/2020 No Yes Inactive Problems Resolved Problems Electronic Signature(s) Signed: 12/16/2020 5:23:51 PM By: Baltazar Najjar MD Entered By: Baltazar Najjar on 12/16/2020 14:49:58 -------------------------------------------------------------------------------- Progress Note Details Patient Name: Date of Service: [name] 12/16/2020 1:45 PM Medical Record Number:  [id number] Patient Account Number: [id number] Date of Birth/Sex: Treating RN: 12/30/1960 (60 y.o. [name], [name] Primary Care Provider: Juluis Rainier Other Clinician: Referring Provider: Treating Provider/Extender: Arelia Longest in Treatment: 6 Subjective History of Present Illness (HPI) ADMISSION 11/03/2020 this is a 60 year old woman with type 2 diabetes. She has severe peripheral neuropathy and autonomic neuropathy. 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 Her problem started at the beginning of July her husband noted a bleeding area in her sock. Unfortunately this became infected requiring admission to hospital from 7/12 through 06/13/2020. An MRI was negative for osteo she received IV antibiotics and was discharged on doxycycline. She did have standard arterial ABIs and TBI's but I did not see any waveforms. Fortunately the ABIs and TBI's were normal at 1.09 and 0.69 on the right and 1.01 and 1.02 on the left respectively. Her last visit with Dr. Allena Katz of podiatry was on 07/04/2020. At 1 which time the measurements were 1.1 x 0.8 x 0.3 she was using a dry dressing. She went on to see Dr. Grandville Silos [facility] on [facility] at which time the wound was debrided and they were prescribed Santyl. The wound is actually on the lateral foot over the base of the fifth metatarsal. She had an additional wound on her right heel but this closed over. They are currently soaking the foot in Epson salts and using Santyl and they have been doing this for quite a period of time. Past medical history includes type 1 diabetes managed with an insulin pump, movement disorder, hypothyroidism, obstructive sleep apnea. She has a cam boot and she wears this religiously spending most of her day currently in bed but she needs the boot on to get up to use her bedside commode ABIs were done during the hospitalization in July that  showed the numbers quoted above 12/13; right lateral foot which is in the setting of an inverted ankle and likely to be this patient's full weightbearing surface. There is some eschar and slough around the wound circumference but in general  the wound surface does not look too bad. She has new diabetic shoes and a brace for when this heals hopefully this will maintain skin integrity. Other than that I think she would need an attempt at surgery if a procedure is available to try and correct this weightbearing to form 12/20; right lateral foot in the setting of a severely inverted ankle. The patient is minimally ambulatory at this point. The areas over the right fifth metatarsal base. 12/01/2020; her right lateral foot wound looks a lot better however she comes in today with full de-epithelialized over the right second third and fourth toes with intense erythema and the erythema is spreading into the dorsal foot. Her husband is able to show me pictures which seems to start around Christmas day. She is insensate and therefore does not feel pain. She has not had any systemic symptoms including fever chills. Her CBGs have been in the usual range for her. She has an allergy to penicillin based on a childhood issue but they tell me she has had ampicillin or amoxicillin since then and tolerated it well other than the fact that most antibiotics seem to cause her nausea 1/10; right lateral foot continues to improve. ooThe area of cellulitis on the right dorsal foot and the erythema on the second third and fourth toes caused me to put her on empiric cephalexin last time. There was nothing to culture. T oday she comes in with erythema receded from the dorsal foot but still markedly erythematous second third and fourth toes. She has wounds especially between the third and fourth but also an area between the second and third. She also has what looks to be a paronychia a developing on the right medial first  toe 1/18; her original wound on the right lateral foot looks about the same somewhat dry. While we are taking care of this she developed intense cellulitis involving the second third and fourth toes spreading into the forefoot. She had 10 days of cephalexin and things have generally improved quite a bit and the second and third toes dorsal foot but not the fourth toe. She has a substantial wound on the fourth toe medially with now exposed bone. We have been using silver alginate here. Hydrofera Blue to the lateral foot Objective Constitutional Sitting or standing Blood Pressure is within target range for patient.. Pulse regular and within target range for patient.Marland Kitchen Respirations regular, non-labored and within target range.. Temperature is normal and within the target range for the patient.Marland Kitchen Appears in no distress. Vitals Time Taken: 1:41 PM, Height: 64 in, Source: Stated, Weight: 165 lbs, Source: Stated, BMI: 28.3, Temperature: 98.6 F, Pulse: 76 bpm, Respiratory Rate: 18 breaths/min, Blood Pressure: 118/78 mmHg. General Notes: Wound exam oo The area on the right lateral foot looks about the same somewhat dry. oo There is less erythema in the right forefoot second and third toes. oo Substantial area now on the medial fourth toe. There is area of exposed bone which I removed with a #3 curette then I did a deep tissue culture and bone scraping for PCR culture oo The area on the lateral third toe and the second toe are about the same. Integumentary (Hair, Skin) Wound #1 status is Open. Original cause of wound was Trauma. The wound is located on the Right,Lateral Foot. The wound measures 0.4cm length x 0.6cm width x 0.1cm depth; 0.188cm^2 area and 0.019cm^3 volume. There is Fat Layer (Subcutaneous Tissue) exposed. There is no tunneling noted, however, there is undermining starting  at 9:00 and ending at 2:00 with a maximum distance of 0.3cm. There is a small amount of serous drainage noted. The  wound margin is distinct with the outline attached to the wound base. There is large (67-100%) pink granulation within the wound bed. There is no necrotic tissue within the wound bed. Wound #2 status is Open. Original cause of wound was Blister. The wound is located on the Right T Second. The wound measures 0.5cm length x 0.3cm oe width x 0.1cm depth; 0.118cm^2 area and 0.012cm^3 volume. There is Fat Layer (Subcutaneous Tissue) exposed. There is no tunneling or undermining noted. There is a small amount of serous drainage noted. The wound margin is flat and intact. There is small (1-33%) pink granulation within the wound bed. There is a large (67-100%) amount of necrotic tissue within the wound bed including Adherent Slough. Wound #3 status is Open. Original cause of wound was Blister. The wound is located on the Right T Third. The wound measures 0.7cm length x 1cm width x oe 0.1cm depth; 0.55cm^2 area and 0.055cm^3 volume. There is Fat Layer (Subcutaneous Tissue) exposed. There is no tunneling or undermining noted. There is a medium amount of serosanguineous drainage noted. The wound margin is flat and intact. There is large (67-100%) red granulation within the wound bed. There is a small (1-33%) amount of necrotic tissue within the wound bed including Adherent Slough. Wound #4 status is Open. Original cause of wound was Blister. The wound is located on the Right T Fourth. The wound measures 1.5cm length x 0.8cm width oe x 0.4cm depth; 0.942cm^2 area and 0.377cm^3 volume. There is bone and Fat Layer (Subcutaneous Tissue) exposed. There is no tunneling or undermining noted. There is a medium amount of serosanguineous drainage noted. The wound margin is flat and intact. There is large (67-100%) red, hyper - granulation within the wound bed. There is no necrotic tissue within the wound bed. Assessment Active Problems ICD-10 Type 1 diabetes mellitus with foot ulcer Non-pressure chronic ulcer of  other part of right foot with other specified severity Type 1 diabetes mellitus with diabetic polyneuropathy Cellulitis of right lower limb Charcot's joint, right ankle and foot Procedures Wound #4 Pre-procedure diagnosis of Wound #4 is a Diabetic Wound/Ulcer of the Lower Extremity located on the Right T Fourth .Severity of Tissue Pre Debridement oe is: Bone involvement without necrosis. There was a Excisional Skin/Subcutaneous Tissue/Muscle/Bone Debridement with a total area of 1.2 sq cm performed by Maxwell Caulobson, Necia Kamm G., MD. With the following instrument(s): Curette to remove Viable and Non-Viable tissue/material. Material removed includes Bone,Subcutaneous Tissue, Slough, Skin: Dermis, and Skin: Epidermis after achieving pain control using Other (Benzocaine). 1 specimen was taken by a Tissue Culture and sent to the lab per facility protocol. A time out was conducted at 14:14, prior to the start of the procedure. A Moderate amount of bleeding was controlled with Silver Nitrate. The procedure was tolerated well with a pain level of 0 throughout and a pain level of 0 following the procedure. Post Debridement Measurements: 1.5cm length x 0.8cm width x 0.4cm depth; 0.377cm^3 volume. Character of Wound/Ulcer Post Debridement is improved. Severity of Tissue Post Debridement is: Bone involvement without necrosis. Post procedure Diagnosis Wound #4: Same as Pre-Procedure Plan Follow-up Appointments: Return Appointment in 1 week. Bathing/ Shower/ Hygiene: May shower and wash wound with soap and water. - on days that dressing is changed Off-Loading: Removable cast walker boot to: - right foot Other: - Minimal weight bearing on right foot Laboratory  ordered were: Anerobic culture - PCR culture right 4th toe WOUND #1: - Foot Wound Laterality: Right, Lateral Cleanser: Soap and Water 1 x Per Day/7 Days Discharge Instructions: May shower and wash wound with dial antibacterial soap and water prior to  dressing change. Prim Dressing: Hydrofera Blue Ready Foam, 2.5 x2.5 in (Generic) 1 x Per Day/7 Days ary Discharge Instructions: Apply to wound bed as instructed Secondary Dressing: Woven Gauze Sponge, Non-Sterile 4x4 in (Generic) 1 x Per Day/7 Days Discharge Instructions: Apply over primary dressing as directed. Secured With: [employer], 4.5x3.1 (in/yd) (Generic) 1 x Per Day/7 Days Discharge Instructions: Secure with Kerlix as directed. Secured With: Paper Tape, 2x10 (in/yd) (Generic) 1 x Per Day/7 Days Discharge Instructions: Secure dressing with tape as directed. WOUND #2: - T Second Wound Laterality: Right oe Cleanser: Soap and Water 1 x Per Day/7 Days Discharge Instructions: May shower and wash wound with dial antibacterial soap and water prior to dressing change. Prim Dressing: KerraCel Ag Gelling Fiber Dressing, 2x2 in (silver alginate) (DME) (Generic) 1 x Per Day/7 Days ary Discharge Instructions: Weave in between toes Secondary Dressing: Woven Gauze Sponges 2x2 in 1 x Per Day/7 Days Discharge Instructions: Apply over primary dressing as directed. Secured With: Insurance underwriter, Sterile 2x75 (in/in) 1 x Per Day/7 Days Discharge Instructions: Secure with stretch gauze as directed. Secured With: Paper Tape, 1x10 (in/yd) 1 x Per Day/7 Days Discharge Instructions: Secure dressing with tape as directed. WOUND #3: - T Third Wound Laterality: Right oe Cleanser: Soap and Water 1 x Per Day/7 Days Discharge Instructions: May shower and wash wound with dial antibacterial soap and water prior to dressing change. Prim Dressing: KerraCel Ag Gelling Fiber Dressing, 2x2 in (silver alginate) (DME) (Generic) 1 x Per Day/7 Days ary Discharge Instructions: Weave in between toes Secondary Dressing: Woven Gauze Sponges 2x2 in 1 x Per Day/7 Days Discharge Instructions: Apply over primary dressing as directed. Secured With: Insurance underwriter, Sterile 2x75  (in/in) 1 x Per Day/7 Days Discharge Instructions: Secure with stretch gauze as directed. Secured With: Paper Tape, 1x10 (in/yd) 1 x Per Day/7 Days Discharge Instructions: Secure dressing with tape as directed. WOUND #4: - T Fourth Wound Laterality: Right oe Cleanser: Soap and Water 1 x Per Day/7 Days Discharge Instructions: May shower and wash wound with dial antibacterial soap and water prior to dressing change. Prim Dressing: KerraCel Ag Gelling Fiber Dressing, 2x2 in (silver alginate) (DME) (Generic) 1 x Per Day/7 Days ary Discharge Instructions: Weave in between toes Secondary Dressing: Woven Gauze Sponges 2x2 in 1 x Per Day/7 Days Discharge Instructions: Apply over primary dressing as directed. Secured With: Insurance underwriter, Sterile 2x75 (in/in) 1 x Per Day/7 Days Discharge Instructions: Secure with stretch gauze as directed. Secured With: Paper Tape, 1x10 (in/yd) 1 x Per Day/7 Days Discharge Instructions: Secure dressing with tape as directed. 1. PCR culture of the medial fourth toe wound which now has exposed bone. There is still erythema of the fourth toe everything else looks better. 2. I did not start antibiotics today I am going to wait for the PCR culture to come back by Thursday before prescribing again 3. Need to review of the prior x-rays of this foot 4. Still silver alginate involving all of the toes 5. Hydrofera Blue to continue to the right lateral foot which was the initial wound we are dealing with. 6. There is some risk of loss of the fourth toe and I explained this to  the patient Electronic Signature(s) Signed: 12/16/2020 5:23:51 PM By: Baltazar Najjar MD Entered By: Baltazar Najjar on 12/16/2020 14:59:51 -------------------------------------------------------------------------------- SuperBill Details Patient Name: Date of Service: [name] 12/16/2020 Medical Record Number: [id number] Patient Account Number: [id number] Date of  Birth/Sex: Treating RN: 1961/08/15 (60 y.o. 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 L03.115 Cellulitis of right lower limb M14.671 Charcot's joint, right ankle and foot Facility Procedures CPT4 Code: [id number] 1 I Description: 1044 - DEB BONE 20 SQ CM/< CD-10 Diagnosis Description L97.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 Modifier: severity Quantity: 1 Physician Procedures CPT4: Description Modifier Code [id number] Debridement; bone (includes epidermis, dermis, subQ tissue, muscle and/or fascia, if performed) 1st 20 sqcm or less ICD-10 Diagnosis Description L97.518 Non-pressure chronic ulcer of other part of right foot with  other specified severity Quantity: 1 Electronic Signature(s) Signed: 12/16/2020 5:23:51 PM By: Baltazar Najjar MD Entered By: Baltazar Najjar on 12/16/2020 15:00:03

## 2020-12-17 NOTE — Progress Notes (Signed)
[name], [name] ([id number]) Visit Report for 12/16/2020 Arrival Information Details Patient Name: Date of Service: [name], [name] [name] 12/16/2020 1:45 PM Medical Record Number: [id number] Patient Account Number: [id number] Date of Birth/Sex: Treating RN: [birth year] (60 y.o. [name] Primary Care Mance Vallejo: [name] Other Clinician: Referring Azzure Garabedian: Treating Dacoda Spallone/Extender: [name] in Treatment: 6 Visit Information History Since Last Visit Added or deleted any medications: Yes Patient Arrived: Wheel Chair Any new allergies or adverse reactions: No Arrival Time: 13:40 Had a fall or experienced change in No Accompanied By: spouse activities of daily living that may affect Transfer Assistance: EasyPivot Patient Lift risk of falls: Patient Identification Verified: Yes Signs or symptoms of abuse/neglect since last visito No Secondary Verification Process Completed: Yes Hospitalized since last visit: No Patient Requires Transmission-Based Precautions: No Implantable device outside of the clinic excluding No Patient Has Alerts: Yes cellular tissue based products placed in the center Patient Alerts: R ABI 1.09 TBI .69 since last visit: L ABI 1.02 TBI .48 Has Dressing in Place as Prescribed: Yes Has Compression in Place as Prescribed: Yes Pain Present Now: No Electronic Signature(s) Signed: 12/17/2020 12:00:41 PM By: Baruch Gouty RN, BSN Entered By: Baruch Gouty on 12/16/2020 13:41:39 -------------------------------------------------------------------------------- Encounter Discharge Information Details Patient Name: Date of Service: [name]. 12/16/2020 1:45 PM Medical Record Number: [id number] Patient Account Number: [id number] Date of Birth/Sex: Treating RN: July 23, 1961 (60 y.o. [name] Primary Care Zamiya Dillard: [name] Other Clinician: Referring Ambers Iyengar: Treating Chardonnay Holzmann/Extender: [name] in Treatment: 6 Encounter Discharge Information Items Post Procedure Vitals Discharge Condition: Stable Temperature (F): 98.6 Ambulatory Status: Wheelchair Pulse (bpm): 76 Discharge Destination: Home Respiratory Rate (breaths/min): 18 Transportation: Private Auto Blood Pressure (mmHg): 118/78 Accompanied By: husband Schedule Follow-up Appointment: Yes Clinical Summary of Care: Electronic Signature(s) Signed: 12/16/2020 5:33:58 PM By: Deon Pilling Entered By: Deon Pilling on 12/16/2020 14:43:31 -------------------------------------------------------------------------------- Lower Extremity Assessment Details Patient Name: Date of Service: [name], [name] 12/16/2020 1:45 PM Medical Record Number: [id number] Patient Account Number: [id number] Date of Birth/Sex: Treating RN: Mar 19, 1961 (60 y.o. [name] Primary Care Rashema Seawright: [name] Other Clinician: Referring Gail Vendetti: Treating Leshay Desaulniers/Extender: [name] in Treatment: 6 Edema Assessment Assessed: [Left: No] [Right: No] Edema: [Left: N] [Right: o] Calf Left: Right: Point of Measurement: 28 cm From Medial Instep 31 cm Ankle Left: Right: Point of Measurement: 10 cm From Medial Instep 18.8 cm Vascular Assessment Pulses: Dorsalis Pedis Palpable: [Right:Yes] Electronic Signature(s) Signed: 12/17/2020 12:00:41 PM By: Baruch Gouty RN, BSN Entered By: Baruch Gouty on 12/16/2020 13:43:00 -------------------------------------------------------------------------------- Multi Wound Chart Details Patient Name: Date of Service: [name] 12/16/2020 1:45 PM Medical Record Number: [id number] Patient Account Number: [id number] Date of Birth/Sex: Treating RN: 10/20/1961 (60 y.o. [name], [name] Primary Care Siddhi Dornbush: [name] Other Clinician: Referring Azana Kiesler: Treating Ilham Roughton/Extender: [name] in Treatment: 6 Vital Signs Height(in): 11 Pulse(bpm): 58 Weight(lbs): 165 Blood Pressure(mmHg): 118/78 Body Mass Index(BMI): 28 Temperature(F): 98.6 Respiratory Rate(breaths/min): 18 Photos: [1:No Photos Right, Lateral Foot] [2:No Photos Right T Second oe] [3:No Photos Right T Third oe] Wound Location: [1:Trauma] [2:Blister] [3:Blister] Wounding Event: [1:Diabetic Wound/Ulcer of the Lower] [2:Diabetic Wound/Ulcer of the Lower] [3:Diabetic Wound/Ulcer of the Lower] Primary Etiology: [1:Extremity Asthma, Sleep Apnea, Peripheral] [2:Extremity Asthma, Sleep Apnea, Peripheral] [3:Extremity Asthma, Sleep Apnea, Peripheral] Comorbid History: [1:Venous Disease, Type I Diabetes, Neuropathy 06/03/2020] [2:Venous Disease, Type I Diabetes, Neuropathy 11/27/2020] [3:Venous Disease, Type I Diabetes,  Neuropathy 11/27/2020] Date Acquired: [1:6] [2:2] [3:2] Weeks of Treatment: [1:Open] [2:Open] [3:Open] Wound Status: [1:0.4x0.6x0.1] [2:0.5x0.3x0.1] [3:0.7x1x0.1] Measurements L x W x D (cm) [1:0.188] [2:0.118] [3:0.55] A (cm) : rea [1:0.019] [2:0.012] [3:0.055] Volume (cm) : [1:88.30%] [2:39.80%] [3:92.20%] % Reduction in A rea: [1:88.10%] [2:40.00%] [3:92.20%] % Reduction in Volume: [1:9] Starting Position 1 (o'clock): [1:2] Ending Position 1 (o'clock): [1:0.3] Maximum Distance 1 (cm): [1:Yes] [2:No] [3:No] Undermining: [1:Grade 2] [2:Grade 1] [3:Grade 1] Classification: [1:Small] [2:Small] [3:Medium] Exudate A mount: [1:Serous] [2:Serous] [3:Serosanguineous] Exudate Type: [1:amber] [2:amber] [3:red, brown] Exudate Color: [1:Distinct, outline attached] [2:Flat and Intact] [3:Flat and Intact] Wound Margin: [1:Large (67-100%)] [2:Small (1-33%)] [3:Large (67-100%)] Granulation A mount: [1:Pink] [2:Pink] [3:Red] Granulation Quality: [1:None Present (0%)] [2:Large (67-100%)] [3:Small (1-33%)] Necrotic A mount: [1:Fat Layer (Subcutaneous Tissue): Yes Fat Layer (Subcutaneous Tissue):  Yes Fat Layer (Subcutaneous Tissue): Yes] Exposed Structures: [1:Fascia: No Tendon: No Muscle: No Joint: No Bone: No Small (1-33%)] [2:Fascia: No Tendon: No Muscle: No Joint: No Bone: No Small (1-33%)] [3:Fascia: No Tendon: No Muscle: No Joint: No Bone: No None] Epithelialization: [1:N/A] [2:N/A] [3:N/A] Debridement: [1:N/A] [2:N/A] [3:N/A] Pain Control: [1:N/A] [2:N/A] [3:N/A] Tissue Debrided: [1:N/A] [2:N/A] [3:N/A] Level: [1:N/A] [2:N/A] [3:N/A] Debridement A (sq cm): [1:rea N/A] [2:N/A] [3:N/A] Instrument: [1:N/A] [2:N/A] [3:N/A] Bleeding: [1:N/A] [2:N/A] [3:N/A] Hemostasis A chieved: [1:N/A] [2:N/A] [3:N/A] Procedural Pain: [1:N/A] [2:N/A] [3:N/A] Post Procedural Pain: Debridement Treatment Response: N/A [2:N/A] [3:N/A] Post Debridement Measurements L x N/A [2:N/A] [3:N/A] W x D (cm) [1:N/A] [2:N/A] [3:N/A] Post Debridement Volume: (cm) [1:N/A] [2:N/A] [3:N/A] Wound Number: 4 N/A N/A Photos: No Photos N/A N/A Right T Fourth oe N/A N/A Wound Location: Blister N/A N/A Wounding Event: Diabetic Wound/Ulcer of the Lower N/A N/A Primary Etiology: Extremity Asthma, Sleep Apnea, Peripheral N/A N/A Comorbid History: Venous Disease, Type I Diabetes, Neuropathy 11/27/2020 N/A N/A Date Acquired: 2 N/A N/A Weeks of Treatment: Open N/A N/A Wound Status: 1.5x0.8x0.4 N/A N/A Measurements L x W x D (cm) 0.942 N/A N/A A (cm) : rea 0.377 N/A N/A Volume (cm) : 76.00% N/A N/A % Reduction in A rea: 52.00% N/A N/A % Reduction in Volume: No N/A N/A Undermining: Grade 2 N/A N/A Classification: Medium N/A N/A Exudate A mount: Serosanguineous N/A N/A Exudate Type: red, brown N/A N/A Exudate Color: Flat and Intact N/A N/A Wound Margin: Large (67-100%) N/A N/A Granulation A mount: Red, Hyper-granulation N/A N/A Granulation Quality: None Present (0%) N/A N/A Necrotic A mount: Fat Layer (Subcutaneous Tissue): Yes N/A N/A Exposed Structures: Bone: Yes Fascia: No Tendon:  No Muscle: No Joint: No None N/A N/A Epithelialization: Debridement - Excisional N/A N/A Debridement: Pre-procedure Verification/Time Out 14:14 N/A N/A Taken: Other N/A N/A Pain Control: Bone, Subcutaneous, Slough N/A N/A Tissue Debrided: Skin/Subcutaneous N/A N/A Level: Tissue/Muscle/Bone 1.2 N/A N/A Debridement A (sq cm): rea Curette N/A N/A Instrument: Moderate N/A N/A Bleeding: Silver Nitrate N/A N/A Hemostasis Achieved: 0 N/A N/A Procedural Pain: 0 N/A N/A Post Procedural Pain: Procedure was tolerated well N/A N/A Debridement Treatment Response: Post Debridement Measurements L x 1.5x0.8x0.4 N/A N/A W x D (cm) 0.377 N/A N/A Post Debridement Volume: (cm) Debridement N/A N/A Procedures Performed: Treatment Notes Wound #1 (Foot) Wound Laterality: Right, Lateral Cleanser Soap and Water Discharge Instruction: May shower and wash wound with dial antibacterial soap and water prior to dressing change. Peri-Wound Care Topical Primary Dressing Hydrofera Blue Ready Foam, 2.5 x2.5 in Discharge Instruction: Apply to wound bed as instructed Secondary Dressing Woven Gauze Sponge, Non-Sterile 4x4 in Discharge Instruction: Apply  over primary dressing as directed. Secured With [employer], 4.5x3.1 (in/yd) Discharge Instruction: Secure with Kerlix as directed. Paper Tape, 2x10 (in/yd) Discharge Instruction: Secure dressing with tape as directed. Compression Wrap Compression Stockings Add-Ons Wound #2 (Toe Second) Wound Laterality: Right Cleanser Soap and Water Discharge Instruction: May shower and wash wound with dial antibacterial soap and water prior to dressing change. Peri-Wound Care Topical Primary Dressing KerraCel Ag Gelling Fiber Dressing, 2x2 in (silver alginate) Discharge Instruction: Weave in between toes Secondary Dressing Woven Gauze Sponges 2x2 in Discharge Instruction: Apply over primary dressing as directed. Secured With Conforming  Stretch Gauze Bandage, Sterile 2x75 (in/in) Discharge Instruction: Secure with stretch gauze as directed. Paper Tape, 1x10 (in/yd) Discharge Instruction: Secure dressing with tape as directed. Compression Wrap Compression Stockings Add-Ons Wound #3 (Toe Third) Wound Laterality: Right Cleanser Soap and Water Discharge Instruction: May shower and wash wound with dial antibacterial soap and water prior to dressing change. Peri-Wound Care Topical Primary Dressing KerraCel Ag Gelling Fiber Dressing, 2x2 in (silver alginate) Discharge Instruction: Weave in between toes Secondary Dressing Woven Gauze Sponges 2x2 in Discharge Instruction: Apply over primary dressing as directed. Secured With Conforming Stretch Gauze Bandage, Sterile 2x75 (in/in) Discharge Instruction: Secure with stretch gauze as directed. Paper Tape, 1x10 (in/yd) Discharge Instruction: Secure dressing with tape as directed. Compression Wrap Compression Stockings Add-Ons Wound #4 (Toe Fourth) Wound Laterality: Right Cleanser Soap and Water Discharge Instruction: May shower and wash wound with dial antibacterial soap and water prior to dressing change. Peri-Wound Care Topical Primary Dressing KerraCel Ag Gelling Fiber Dressing, 2x2 in (silver alginate) Discharge Instruction: Weave in between toes Secondary Dressing Woven Gauze Sponges 2x2 in Discharge Instruction: Apply over primary dressing as directed. Secured With Conforming Stretch Gauze Bandage, Sterile 2x75 (in/in) Discharge Instruction: Secure with stretch gauze as directed. Paper Tape, 1x10 (in/yd) Discharge Instruction: Secure dressing with tape as directed. Compression Wrap Compression Stockings Add-Ons Electronic Signature(s) Signed: 12/16/2020 5:23:51 PM By: Linton Ham MD Signed: 12/17/2020 5:25:13 PM By: Rhae Hammock RN Entered By: Linton Ham on 12/16/2020  14:51:57 -------------------------------------------------------------------------------- Multi-Disciplinary Care Plan Details Patient Name: Date of Service: [place], [name] 12/16/2020 1:45 PM Medical Record Number: [id number] Patient Account Number: [id number] Date of Birth/Sex: Treating RN: 09/14/1961 (60 y.o. [name], [name] Primary Care Nyheim Seufert: [name] Other Clinician: Referring Katelin Kutsch: Treating Niyanna Asch/Extender: [name] in Treatment: 6 Active Inactive Nutrition Nursing Diagnoses: Impaired glucose control: actual or potential Potential for alteratiion in Nutrition/Potential for imbalanced nutrition Goals: Patient/caregiver agrees to and verbalizes understanding of need to use nutritional supplements and/or vitamins as prescribed Date Initiated: 11/03/2020 Date Inactivated: 12/01/2020 Target Resolution Date: 12/05/2020 Goal Status: Met Patient/caregiver will maintain therapeutic glucose control Date Initiated: 11/03/2020 Target Resolution Date: 01/02/2021 Goal Status: Active Interventions: Assess HgA1c results as ordered upon admission and as needed Assess patient nutrition upon admission and as needed per policy Provide education on elevated blood sugars and impact on wound healing Provide education on nutrition Treatment Activities: Education provided on Nutrition : 12/08/2020 Notes: Wound/Skin Impairment Nursing Diagnoses: Impaired tissue integrity Knowledge deficit related to ulceration/compromised skin integrity Goals: Patient/caregiver will verbalize understanding of skin care regimen Date Initiated: 11/03/2020 Target Resolution Date: 01/02/2021 Goal Status: Active Ulcer/skin breakdown will have a volume reduction of 30% by week 4 Date Initiated: 11/03/2020 Date Inactivated: 12/01/2020 Target Resolution Date: 12/05/2020 Goal Status: Met Interventions: Assess patient/caregiver ability to obtain necessary supplies Assess  patient/caregiver ability to perform ulcer/skin care regimen upon admission and as needed Assess  ulceration(s) every visit Provide education on ulcer and skin care Notes: Electronic Signature(s) Signed: 12/17/2020 5:25:13 PM By: Rhae Hammock RN Entered By: Rhae Hammock on 12/16/2020 14:29:31 -------------------------------------------------------------------------------- Pain Assessment Details Patient Name: Date of Service: [name] 12/16/2020 1:45 PM Medical Record Number: [id number] Patient Account Number: [id number] Date of Birth/Sex: Treating RN: 11-Mar-1961 (60 y.o. [name] Primary Care Naeem Quillin: [name] Other Clinician: Referring Meital Riehl: Treating Frederico Gerling/Extender: [name] in Treatment: 6 Active Problems Location of Pain Severity and Description of Pain Patient Has Paino No Site Locations Rate the pain. Current Pain Level: 0 Pain Management and Medication Current Pain Management: Electronic Signature(s) Signed: 12/17/2020 12:00:41 PM By: Baruch Gouty RN, BSN Entered By: Baruch Gouty on 12/16/2020 13:42:29 -------------------------------------------------------------------------------- Patient/Caregiver Education Details Patient Name: Date of Service: [name] 1/18/2022andnbsp1:45 PM Medical Record Number: [id number] Patient Account Number: [id number] Date of Birth/Gender: Treating RN: [birth year] (60 y.o. [name], [name] Primary Care Physician: [name] Other Clinician: Referring Physician: Treating Physician/Extender: [name] in Treatment: 6 Education Assessment Education Provided To: Patient Education Topics Provided Wound/Skin Impairment: Methods: Explain/Verbal Responses: State content correctly Electronic Signature(s) Signed: 12/17/2020 5:25:13 PM By: Rhae Hammock RN Entered By: Rhae Hammock on 12/16/2020  14:29:47 -------------------------------------------------------------------------------- Wound Assessment Details Patient Name: Date of Service: [name] 12/16/2020 1:45 PM Medical Record Number: [id number] Patient Account Number: [id number] Date of Birth/Sex: Treating RN: [birth year] (60 y.o. [name] Primary Care Micki Cassel: [name] Other Clinician: Referring Yocelin Vanlue: Treating Breland Trouten/Extender: [name] in Treatment: 6 Wound Status Wound Number: 1 Primary Diabetic Wound/Ulcer of the Lower Extremity Etiology: Wound Location: Right, Lateral Foot Wound Open Wounding Event: Trauma Status: Date Acquired: 06/03/2020 Comorbid Asthma, Sleep Apnea, Peripheral Venous Disease, Type I Weeks Of Treatment: 6 History: Diabetes, Neuropathy Clustered Wound: No Photos Photo Uploaded By: Mikeal Hawthorne on 12/17/2020 15:19:11 Wound Measurements Length: (cm) 0.4 Width: (cm) 0.6 Depth: (cm) 0.1 Area: (cm) 0.188 Volume: (cm) 0.019 % Reduction in Area: 88.3% % Reduction in Volume: 88.1% Epithelialization: Small (1-33%) Tunneling: No Undermining: Yes Starting Position (o'clock): 9 Ending Position (o'clock): 2 Maximum Distance: (cm) 0.3 Wound Description Classification: Grade 2 Wound Margin: Distinct, outline attached Exudate Amount: Small Exudate Type: Serous Exudate Color: amber Foul Odor After Cleansing: No Slough/Fibrino Yes Wound Bed Granulation Amount: Large (67-100%) Exposed Structure Granulation Quality: Pink Fascia Exposed: No Necrotic Amount: None Present (0%) Fat Layer (Subcutaneous Tissue) Exposed: Yes Tendon Exposed: No Muscle Exposed: No Joint Exposed: No Bone Exposed: No Treatment Notes Wound #1 (Foot) Wound Laterality: Right, Lateral Cleanser Soap and Water Discharge Instruction: May shower and wash wound with dial antibacterial soap and water prior to dressing change. Peri-Wound Care Topical Primary  Dressing Hydrofera Blue Ready Foam, 2.5 x2.5 in Discharge Instruction: Apply to wound bed as instructed Secondary Dressing Woven Gauze Sponge, Non-Sterile 4x4 in Discharge Instruction: Apply over primary dressing as directed. Secured With [employer], 4.5x3.1 (in/yd) Discharge Instruction: Secure with Kerlix as directed. Paper Tape, 2x10 (in/yd) Discharge Instruction: Secure dressing with tape as directed. Compression Wrap Compression Stockings Add-Ons Electronic Signature(s) Signed: 12/17/2020 12:00:41 PM By: Baruch Gouty RN, BSN Entered By: Baruch Gouty on 12/16/2020 13:53:25 -------------------------------------------------------------------------------- Wound Assessment Details Patient Name: Date of Service: [name] 12/16/2020 1:45 PM Medical Record Number: [id number] Patient Account Number: [id number] Date of Birth/Sex: Treating RN: [birth year] (60 y.o. [name] Primary Care Avaline Stillson: [name] Other Clinician: Referring Aram Domzalski: Treating Garreth Burnsworth/Extender: Linton Ham  [name] Weeks in Treatment: 6 Wound Status Wound Number: 2 Primary Diabetic Wound/Ulcer of the Lower Extremity Etiology: Wound Location: Right T Second oe Wound Open Wounding Event: Blister Status: Date Acquired: 11/27/2020 Comorbid Asthma, Sleep Apnea, Peripheral Venous Disease, Type I Weeks Of Treatment: 2 History: Diabetes, Neuropathy Clustered Wound: No Photos Photo Uploaded By: Mikeal Hawthorne on 12/17/2020 15:19:12 Wound Measurements Length: (cm) 0.5 Width: (cm) 0.3 Depth: (cm) 0.1 Area: (cm) 0.118 Volume: (cm) 0.012 % Reduction in Area: 39.8% % Reduction in Volume: 40% Epithelialization: Small (1-33%) Tunneling: No Undermining: No Wound Description Classification: Grade 1 Wound Margin: Flat and Intact Exudate Amount: Small Exudate Type: Serous Exudate Color: amber Foul Odor After Cleansing: No Slough/Fibrino Yes Wound  Bed Granulation Amount: Small (1-33%) Exposed Structure Granulation Quality: Pink Fascia Exposed: No Necrotic Amount: Large (67-100%) Fat Layer (Subcutaneous Tissue) Exposed: Yes Necrotic Quality: Adherent Slough Tendon Exposed: No Muscle Exposed: No Joint Exposed: No Bone Exposed: No Treatment Notes Wound #2 (Toe Second) Wound Laterality: Right Cleanser Soap and Water Discharge Instruction: May shower and wash wound with dial antibacterial soap and water prior to dressing change. Peri-Wound Care Topical Primary Dressing KerraCel Ag Gelling Fiber Dressing, 2x2 in (silver alginate) Discharge Instruction: Weave in between toes Secondary Dressing Woven Gauze Sponges 2x2 in Discharge Instruction: Apply over primary dressing as directed. Secured With Conforming Stretch Gauze Bandage, Sterile 2x75 (in/in) Discharge Instruction: Secure with stretch gauze as directed. Paper Tape, 1x10 (in/yd) Discharge Instruction: Secure dressing with tape as directed. Compression Wrap Compression Stockings Add-Ons Electronic Signature(s) Signed: 12/17/2020 12:00:41 PM By: Baruch Gouty RN, BSN Entered By: Baruch Gouty on 12/16/2020 13:53:43 -------------------------------------------------------------------------------- Wound Assessment Details Patient Name: Date of Service: [name] 12/16/2020 1:45 PM Medical Record Number: [id number] Patient Account Number: [id number] Date of Birth/Sex: Treating RN: Oct 05, 1961 (60 y.o. [name] Primary Care Zachory Mangual: [name] Other Clinician: Referring Tomeka Kantner: Treating Deborra Phegley/Extender: [name] in Treatment: 6 Wound Status Wound Number: 3 Primary Diabetic Wound/Ulcer of the Lower Extremity Etiology: Wound Location: Right T Third oe Wound Open Wounding Event: Blister Status: Date Acquired: 11/27/2020 Comorbid Asthma, Sleep Apnea, Peripheral Venous Disease, Type I Weeks Of Treatment:  2 History: Diabetes, Neuropathy Clustered Wound: No Photos Photo Uploaded By: Mikeal Hawthorne on 12/17/2020 15:17:43 Wound Measurements Length: (cm) 0.7 Width: (cm) 1 Depth: (cm) 0.1 Area: (cm) 0.55 Volume: (cm) 0.055 % Reduction in Area: 92.2% % Reduction in Volume: 92.2% Epithelialization: None Tunneling: No Undermining: No Wound Description Classification: Grade 1 Wound Margin: Flat and Intact Exudate Amount: Medium Exudate Type: Serosanguineous Exudate Color: red, brown Foul Odor After Cleansing: No Slough/Fibrino Yes Wound Bed Granulation Amount: Large (67-100%) Exposed Structure Granulation Quality: Red Fascia Exposed: No Necrotic Amount: Small (1-33%) Fat Layer (Subcutaneous Tissue) Exposed: Yes Necrotic Quality: Adherent Slough Tendon Exposed: No Muscle Exposed: No Joint Exposed: No Bone Exposed: No Treatment Notes Wound #3 (Toe Third) Wound Laterality: Right Cleanser Soap and Water Discharge Instruction: May shower and wash wound with dial antibacterial soap and water prior to dressing change. Peri-Wound Care Topical Primary Dressing KerraCel Ag Gelling Fiber Dressing, 2x2 in (silver alginate) Discharge Instruction: Weave in between toes Secondary Dressing Woven Gauze Sponges 2x2 in Discharge Instruction: Apply over primary dressing as directed. Secured With Conforming Stretch Gauze Bandage, Sterile 2x75 (in/in) Discharge Instruction: Secure with stretch gauze as directed. Paper Tape, 1x10 (in/yd) Discharge Instruction: Secure dressing with tape as directed. Compression Wrap Compression Stockings Add-Ons Electronic Signature(s) Signed: 12/17/2020 12:00:41 PM By: Baruch Gouty RN,  BSN Entered By: Baruch Gouty on 12/16/2020 13:54:07 -------------------------------------------------------------------------------- Wound Assessment Details Patient Name: Date of Service: [name], [name] 12/16/2020 1:45 PM Medical Record Number:  [id number] Patient Account Number: [id number] Date of Birth/Sex: Treating RN: 08-28-1961 (60 y.o. [name] Primary Care Margi Edmundson: [name] Other Clinician: Referring Chantay Whitelock: Treating Tank Difiore/Extender: [name] in Treatment: 6 Wound Status Wound Number: 4 Primary Diabetic Wound/Ulcer of the Lower Extremity Etiology: Wound Location: Right T Fourth oe Wound Open Wounding Event: Blister Status: Date Acquired: 11/27/2020 Comorbid Asthma, Sleep Apnea, Peripheral Venous Disease, Type I Weeks Of Treatment: 2 History: Diabetes, Neuropathy Clustered Wound: No Photos Photo Uploaded By: Mikeal Hawthorne on 12/17/2020 15:17:43 Wound Measurements Length: (cm) 1.5 Width: (cm) 0.8 Depth: (cm) 0.4 Area: (cm) 0.942 Volume: (cm) 0.377 % Reduction in Area: 76% % Reduction in Volume: 52% Epithelialization: None Tunneling: No Undermining: No Wound Description Classification: Grade 2 Wound Margin: Flat and Intact Exudate Amount: Medium Exudate Type: Serosanguineous Exudate Color: red, brown Foul Odor After Cleansing: No Slough/Fibrino Yes Wound Bed Granulation Amount: Large (67-100%) Exposed Structure Granulation Quality: Red, Hyper-granulation Fascia Exposed: No Necrotic Amount: None Present (0%) Fat Layer (Subcutaneous Tissue) Exposed: Yes Tendon Exposed: No Muscle Exposed: No Joint Exposed: No Bone Exposed: Yes Treatment Notes Wound #4 (Toe Fourth) Wound Laterality: Right Cleanser Soap and Water Discharge Instruction: May shower and wash wound with dial antibacterial soap and water prior to dressing change. Peri-Wound Care Topical Primary Dressing KerraCel Ag Gelling Fiber Dressing, 2x2 in (silver alginate) Discharge Instruction: Weave in between toes Secondary Dressing Woven Gauze Sponges 2x2 in Discharge Instruction: Apply over primary dressing as directed. Secured With Conforming Stretch Gauze Bandage, Sterile 2x75  (in/in) Discharge Instruction: Secure with stretch gauze as directed. Paper Tape, 1x10 (in/yd) Discharge Instruction: Secure dressing with tape as directed. Compression Wrap Compression Stockings Add-Ons Electronic Signature(s) Signed: 12/17/2020 12:00:41 PM By: Baruch Gouty RN, BSN Entered By: Baruch Gouty on 12/16/2020 13:54:29 -------------------------------------------------------------------------------- [name] Details Patient Name: Date of Service: [name]. 12/16/2020 1:45 PM Medical Record Number: [id number] Patient Account Number: [id number] Date of Birth/Sex: Treating RN: 1960-12-04 (60 y.o. Martyn Malay, Linda Primary Care Jereme Loren: [name] Other Clinician: Referring Chales Pelissier: Treating Kearsten Ginther/Extender: [name] in Treatment: 6 Vital Signs Time Taken: 13:41 Temperature (F): 98.6 Height (in): 64 Pulse (bpm): 76 Source: Stated Respiratory Rate (breaths/min): 18 Weight (lbs): 165 Blood Pressure (mmHg): 118/78 Source: Stated Reference Range: 80 - 120 mg / dl Body Mass Index (BMI): 28.3 Electronic Signature(s) Signed: 12/17/2020 12:00:41 PM By: Baruch Gouty RN, BSN Entered By: Baruch Gouty on 12/16/2020 13:42:19

## 2020-12-23 ENCOUNTER — Other Ambulatory Visit: Payer: Self-pay

## 2020-12-23 ENCOUNTER — Encounter (HOSPITAL_BASED_OUTPATIENT_CLINIC_OR_DEPARTMENT_OTHER): Payer: Managed Care, Other (non HMO) | Admitting: Internal Medicine

## 2020-12-23 ENCOUNTER — Encounter (INDEPENDENT_AMBULATORY_CARE_PROVIDER_SITE_OTHER): Payer: Self-pay | Admitting: Ophthalmology

## 2020-12-23 ENCOUNTER — Ambulatory Visit (INDEPENDENT_AMBULATORY_CARE_PROVIDER_SITE_OTHER): Payer: Managed Care, Other (non HMO) | Admitting: Ophthalmology

## 2020-12-23 DIAGNOSIS — H4311 Vitreous hemorrhage, right eye: Secondary | ICD-10-CM | POA: Diagnosis not present

## 2020-12-23 DIAGNOSIS — E103512 Type 1 diabetes mellitus with proliferative diabetic retinopathy with macular edema, left eye: Secondary | ICD-10-CM

## 2020-12-23 DIAGNOSIS — E103591 Type 1 diabetes mellitus with proliferative diabetic retinopathy without macular edema, right eye: Secondary | ICD-10-CM

## 2020-12-23 DIAGNOSIS — E103552 Type 1 diabetes mellitus with stable proliferative diabetic retinopathy, left eye: Secondary | ICD-10-CM | POA: Diagnosis not present

## 2020-12-23 HISTORY — DX: Vitreous hemorrhage, right eye: H43.11

## 2020-12-23 NOTE — Assessment & Plan Note (Signed)
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  This should clear post PRP in the right eye without the need for surgical intervention.  If this does worsen we can reconsider this matter

## 2020-12-23 NOTE — Patient Instructions (Signed)
Pt needs auth for PRP OD

## 2020-12-24 NOTE — Progress Notes (Signed)
[name], [name] ([id number]) Visit Report for 12/23/2020 Arrival Information Details Patient Name: Date of Service: [name], [name] [name] 12/23/2020 2:30 PM Medical Record Number: [id number] Patient Account Number: [id number] Date of Birth/Sex: Treating [name]: [name] (60 y.o. [name], [name] Primary Care [name]: [name] Other Clinician: Referring [name]: Treating [name]/Extender: [name] in Treatment: 7 Visit Information History Since Last Visit Added or deleted any medications: No Patient Arrived: Wheel Chair Any [name] allergies or adverse reactions: No Arrival Time: 14:40 Had a fall or experienced change in No Accompanied By: husband activities of daily living that may affect Transfer Assistance: None risk of falls: Patient Identification Verified: Yes Signs or symptoms of abuse/neglect since last visito No Secondary Verification Process Completed: Yes Hospitalized since last visit: No Patient Requires Transmission-Based Precautions: No Implantable device outside of the clinic excluding No Patient Has Alerts: Yes cellular tissue based products placed in the center Patient Alerts: R ABI 1.09 TBI .69 since last visit: L ABI 1.02 TBI .48 Has Dressing in Place as Prescribed: Yes Pain Present Now: No Electronic Signature(s) Signed: 12/23/2020 3:15:52 PM By: [name] Entered By: [name] on 12/23/2020 14:42:14 -------------------------------------------------------------------------------- Clinic Level of Care Assessment Details Patient Name: Date of Service: [name] 12/23/2020 2:30 PM Medical Record Number: [id number] Patient Account Number: [id number] Date of Birth/Sex: Treating [name]: [name] (60 y.o. [name], [name] Primary Care [name]: [name] Other Clinician: Referring [name]: Treating [name]/Extender: [name] in Treatment: 7 Clinic Level of Care  Assessment Items TOOL 4 Quantity Score X- 1 0 Use when only an EandM is performed on FOLLOW-UP visit ASSESSMENTS - Nursing Assessment / Reassessment X- 1 10 Reassessment of Co-morbidities (includes updates in patient status) X- 1 5 Reassessment of Adherence to Treatment Plan ASSESSMENTS - Wound and Skin A ssessment / Reassessment []  - 0 Simple Wound Assessment / Reassessment - one wound X- 4 5 Complex Wound Assessment / Reassessment - multiple wounds X- 1 10 Dermatologic / Skin Assessment (not related to wound area) ASSESSMENTS - Focused Assessment X- 2 5 Circumferential Edema Measurements - multi extremities X- 1 10 Nutritional Assessment / Counseling / Intervention []  - 0 Lower Extremity Assessment (monofilament, tuning fork, pulses) []  - 0 Peripheral Arterial Disease Assessment (using hand held doppler) ASSESSMENTS - Ostomy and/or Continence Assessment and Care []  - 0 Incontinence Assessment and Management []  - 0 Ostomy Care Assessment and Management (repouching, etc.) PROCESS - Coordination of Care []  - 0 Simple Patient / Family Education for ongoing care X- 1 20 Complex (extensive) Patient / Family Education for ongoing care X- 1 10 Staff obtains Programmer, systems, Records, T Results / Process Orders est []  - 0 Staff telephones HHA, Nursing Homes / Clarify orders / etc []  - 0 Routine Transfer to another Facility (non-emergent condition) []  - 0 Routine Hospital Admission (non-emergent condition) []  - 0 [name] Admissions / Biomedical engineer / Ordering NPWT Apligraf, etc. , []  - 0 Emergency Hospital Admission (emergent condition) X- 1 10 Simple Discharge Coordination []  - 0 Complex (extensive) Discharge Coordination PROCESS - Special Needs []  - 0 Pediatric / Minor Patient Management []  - 0 Isolation Patient Management []  - 0 Hearing / Language / Visual special needs []  - 0 Assessment of Community assistance (transportation, D/C planning, etc.) []  -  0 Additional assistance / Altered mentation []  - 0 Support Surface(s) Assessment (bed, cushion, seat, etc.) INTERVENTIONS - Wound Cleansing / Measurement []  - 0 Simple Wound Cleansing - one wound X-  4 5 Complex Wound Cleansing - multiple wounds X- 1 5 Wound Imaging (photographs - any number of wounds) []  - 0 Wound Tracing (instead of photographs) []  - 0 Simple Wound Measurement - one wound X- 4 5 Complex Wound Measurement - multiple wounds INTERVENTIONS - Wound Dressings []  - 0 Small Wound Dressing one or multiple wounds X- 4 15 Medium Wound Dressing one or multiple wounds []  - 0 Large Wound Dressing one or multiple wounds []  - 0 Application of Medications - topical []  - 0 Application of Medications - injection INTERVENTIONS - Miscellaneous []  - 0 External ear exam []  - 0 Specimen Collection (cultures, biopsies, blood, body fluids, etc.) []  - 0 Specimen(s) / Culture(s) sent or taken to Lab for analysis []  - 0 Patient Transfer (multiple staff / Civil Service fast streamer / Similar devices) []  - 0 Simple Staple / Suture removal (25 or less) []  - 0 Complex Staple / Suture removal (26 or more) []  - 0 Hypo / Hyperglycemic Management (close monitor of Blood Glucose) []  - 0 Ankle / Brachial Index (ABI) - do not check if billed separately X- 1 5 Vital Signs Has the patient been seen at the hospital within the last three years: Yes Total Score: 215 Level Of Care: [name]/Established - Level 5 Electronic Signature(s) Signed: 12/24/2020 5:56:51 PM By: [name] Entered By: [name] on 12/23/2020 17:03:05 -------------------------------------------------------------------------------- Encounter Discharge Information Details Patient Name: Date of Service: [name] 12/23/2020 2:30 PM Medical Record Number: [id number] Patient Account Number: [id number] Date of Birth/Sex: Treating [name]: [birth year] (60 y.o. [name] Primary Care [name]: [name] Other  Clinician: Referring [name]: Treating [name]/Extender: [name] in Treatment: 7 Encounter Discharge Information Items Discharge Condition: Stable Ambulatory Status: Wheelchair Discharge Destination: Home Transportation: Private Auto Accompanied By: husband Schedule Follow-up Appointment: Yes Clinical Summary of Care: Electronic Signature(s) Signed: 12/23/2020 6:32:06 PM By: Deon Pilling Entered By: Deon Pilling on 12/23/2020 17:57:30 -------------------------------------------------------------------------------- Lower Extremity Assessment Details Patient Name: Date of Service: [name], [name] 12/23/2020 2:30 PM Medical Record Number: [id number] Patient Account Number: [id number] Date of Birth/Sex: Treating [name]: [name] 17, [name] (60 y.o. Helene Shoe, Tammi Klippel Primary Care Mathews Stuhr: [name] Other Clinician: Referring Flor Houdeshell: Treating Keyra Virella/Extender: [name] in Treatment: 7 Edema Assessment Assessed: Shirlyn Goltz: No] [Right: Yes] Edema: [Left: N] [Right: o] Calf Left: Right: Point of Measurement: 28 cm From Medial Instep 30 cm Ankle Left: Right: Point of Measurement: 10 cm From Medial Instep 18.5 cm Vascular Assessment Pulses: Dorsalis Pedis Palpable: [Right:Yes] Electronic Signature(s) Signed: 12/23/2020 6:32:06 PM By: Deon Pilling Entered By: Deon Pilling on 12/23/2020 15:07:53 -------------------------------------------------------------------------------- Multi Wound Chart Details Patient Name: Date of Service: [name] 12/23/2020 2:30 PM Medical Record Number: [id number] Patient Account Number: [id number] Date of Birth/Sex: Treating [name]: [name] (60 y.o. [name], [name] Primary Care Vieno Tarrant: [name] Other Clinician: Referring Erminie Foulks: Treating Kadir Azucena/Extender: [name] in Treatment: 7 Vital Signs Height(in): 1 Pulse(bpm):  11 Weight(lbs): 165 Blood Pressure(mmHg): 111/73 Body Mass Index(BMI): 28 Temperature(F): 98.0 Respiratory Rate(breaths/min): 18 Photos: [1:No Photos Right, Lateral Foot] [2:No Photos Right T Second oe] [3:No Photos Right T Third oe] Wound Location: [1:Trauma] [2:Blister] [3:Blister] Wounding Event: [1:Diabetic Wound/Ulcer of the Lower] [2:Diabetic Wound/Ulcer of the Lower] [3:Diabetic Wound/Ulcer of the Lower] Primary Etiology: [1:Extremity Asthma, Sleep Apnea, Peripheral] [2:Extremity Asthma, Sleep Apnea, Peripheral] [3:Extremity Asthma, Sleep Apnea, Peripheral] Comorbid History: [1:Venous Disease, Type I Diabetes, Neuropathy 06/03/2020] [2:Venous Disease, Type I Diabetes, Neuropathy 11/27/2020] [  3:Venous Disease, Type I Diabetes, Neuropathy 11/27/2020] Date Acquired: [1:7] [2:3] [3:3] Weeks of Treatment: [1:Open] [2:Open] [3:Open] Wound Status: [1:0.3x0.3x0.1] [2:0.3x0.4x0.1] [3:0.3x0.3x0.1] Measurements L x W x D (cm) [1:0.071] [2:0.094] [3:0.071] A (cm) : rea [1:0.007] [2:0.009] [3:0.007] Volume (cm) : [1:95.60%] [2:52.00%] [3:99.00%] % Reduction in A rea: [1:95.60%] [2:55.00%] [3:99.00%] % Reduction in Volume: [1:Grade 2] [2:Grade 1] [3:Grade 1] Classification: [1:Small] [2:Small] [3:Medium] Exudate A mount: [1:Serous] [2:Serosanguineous] [3:Serosanguineous] Exudate Type: [1:amber] [2:red, brown] [3:red, brown] Exudate Color: [1:Distinct, outline attached] [2:Flat and Intact] [3:Flat and Intact] Wound Margin: [1:Large (67-100%)] [2:Medium (34-66%)] [3:Large (67-100%)] Granulation A mount: [1:Pink] [2:Pink] [3:Red] Granulation Quality: [1:None Present (0%)] [2:Medium (34-66%)] [3:Small (1-33%)] Necrotic A mount: [1:Fat Layer (Subcutaneous Tissue): Yes Fat Layer (Subcutaneous Tissue): Yes Fat Layer (Subcutaneous Tissue): Yes] Exposed Structures: [1:Fascia: No Tendon: No Muscle: No Joint: No Bone: No Medium (34-66%)] [2:Fascia: No Tendon: No Muscle: No Joint: No Bone: No Medium  (34-66%)] [3:Fascia: No Tendon: No Muscle: No Joint: No Bone: No None] Epithelialization: [1:callous noted to periwound.] [2:N/A] [3:N/A] Wound Number: 4 N/A N/A Photos: No Photos N/A N/A Right T Fourth oe N/A N/A Wound Location: Blister N/A N/A Wounding Event: Diabetic Wound/Ulcer of the Lower N/A N/A Primary Etiology: Extremity Asthma, Sleep Apnea, Peripheral N/A N/A Comorbid History: Venous Disease, Type I Diabetes, Neuropathy 11/27/2020 N/A N/A Date Acquired: 3 N/A N/A Weeks of Treatment: Open N/A N/A Wound Status: 0.5x0.6x0.1 N/A N/A Measurements L x W x D (cm) 0.236 N/A N/A A (cm) : rea 0.024 N/A N/A Volume (cm) : 94.00% N/A N/A % Reduction in A rea: 96.90% N/A N/A % Reduction in Volume: Grade 2 N/A N/A Classification: Medium N/A N/A Exudate A mount: Serosanguineous N/A N/A Exudate Type: red, brown N/A N/A Exudate Color: Flat and Intact N/A N/A Wound Margin: Large (67-100%) N/A N/A Granulation A mount: Red, Hyper-granulation N/A N/A Granulation Quality: None Present (0%) N/A N/A Necrotic A mount: Fat Layer (Subcutaneous Tissue): Yes N/A N/A Exposed Structures: Bone: Yes Fascia: No Tendon: No Muscle: No Joint: No None N/A N/A Epithelialization: N/A N/A N/A Assessment Notes: Treatment Notes Electronic Signature(s) Signed: 12/23/2020 5:14:25 PM By: Linton Ham MD Signed: 12/24/2020 5:56:51 PM By: [name] Entered By: Linton Ham on 12/23/2020 16:47:50 -------------------------------------------------------------------------------- Multi-Disciplinary Care Plan Details Patient Name: Date of Service: [name]. 12/23/2020 2:30 PM Medical Record Number: [id number] Patient Account Number: [id number] Date of Birth/Sex: Treating [name]: [birth year] (60 y.o. [name], [name] Primary Care Annaleigh Steinmeyer: [name] Other Clinician: Referring Deverick Pruss: Treating Cristian Grieves/Extender: [name] in  Treatment: 7 Active Inactive Nutrition Nursing Diagnoses: Impaired glucose control: actual or potential Potential for alteratiion in Nutrition/Potential for imbalanced nutrition Goals: Patient/caregiver agrees to and verbalizes understanding of need to use nutritional supplements and/or vitamins as prescribed Date Initiated: 11/03/2020 Date Inactivated: 12/01/2020 Target Resolution Date: 12/05/2020 Goal Status: Met Patient/caregiver will maintain therapeutic glucose control Date Initiated: 11/03/2020 Target Resolution Date: 01/02/2021 Goal Status: Active Interventions: Assess HgA1c results as ordered upon admission and as needed Assess patient nutrition upon admission and as needed per policy Provide education on elevated blood sugars and impact on wound healing Provide education on nutrition Treatment Activities: Education provided on Nutrition : 11/03/2020 Notes: Wound/Skin Impairment Nursing Diagnoses: Impaired tissue integrity Knowledge deficit related to ulceration/compromised skin integrity Goals: Patient/caregiver will verbalize understanding of skin care regimen Date Initiated: 11/03/2020 Target Resolution Date: 01/02/2021 Goal Status: Active Ulcer/skin breakdown will have a volume reduction of 30% by week 4 Date Initiated: 11/03/2020 Date Inactivated:  12/01/2020 Target Resolution Date: 12/05/2020 Goal Status: Met Interventions: Assess patient/caregiver ability to obtain necessary supplies Assess patient/caregiver ability to perform ulcer/skin care regimen upon admission and as needed Assess ulceration(s) every visit Provide education on ulcer and skin care Notes: Electronic Signature(s) Signed: 12/24/2020 5:56:51 PM By: [name] Entered By: [name] on 12/23/2020 16:01:13 -------------------------------------------------------------------------------- Pain Assessment Details Patient Name: Date of Service: Iacovelli, [name] 12/23/2020 2:30 PM Medical  Record Number: [id number] Patient Account Number: [id number] Date of Birth/Sex: Treating [name]: [birth year] (60 y.o. [name], [name] Primary Care Shanique Aslinger: [name] Other Clinician: Referring Keyuna Cuthrell: Treating Dorin Stooksbury/Extender: [name] in Treatment: 7 Active Problems Location of Pain Severity and Description of Pain Patient Has Paino No Site Locations Pain Management and Medication Current Pain Management: Electronic Signature(s) Signed: 12/23/2020 3:15:52 PM By: [name] Signed: 12/24/2020 5:56:51 PM By: [name] Entered By: [name] on 12/23/2020 14:42:45 -------------------------------------------------------------------------------- Patient/Caregiver Education Details Patient Name: Date of Service: [name] 1/25/2022andnbsp2:30 PM Medical Record Number: [id number] Patient Account Number: [id number] Date of Birth/Gender: Treating [name]: [name] (60 y.o. [name], [name] Primary Care Physician: [name] Other Clinician: Referring Physician: Treating Physician/Extender: [name] in Treatment: 7 Education Assessment Education Provided To: Patient Education Topics Provided Elevated Blood Sugar/ Impact on Healing: Wound/Skin Impairment: Methods: Explain/Verbal Responses: State content correctly Electronic Signature(s) Signed: 12/24/2020 5:56:51 PM By: [name] Entered By: [name] on 12/23/2020 16:01:39 -------------------------------------------------------------------------------- Wound Assessment Details Patient Name: Date of Service: Croll, [name] 12/23/2020 2:30 PM Medical Record Number: [id number] Patient Account Number: [id number] Date of Birth/Sex: Treating [name]: [birth year] (60 y.o. [name], [name] Primary Care Elysha Daw: [name] Other Clinician: Referring Danilo Cappiello: Treating Jamyiah Labella/Extender: [name] in Treatment: 7 Wound Status Wound Number: 1 Primary Diabetic Wound/Ulcer of the Lower Extremity Etiology: Wound Location: Right, Lateral Foot Wound Open Wounding Event: Trauma Status: Date Acquired: 06/03/2020 Comorbid Asthma, Sleep Apnea, Peripheral Venous Disease, Type I Weeks Of Treatment: 7 History: Diabetes, Neuropathy Clustered Wound: No Wound Measurements Length: (cm) 0.3 Width: (cm) 0.3 Depth: (cm) 0.1 Area: (cm) 0.071 Volume: (cm) 0.007 % Reduction in Area: 95.6% % Reduction in Volume: 95.6% Epithelialization: Medium (34-66%) Tunneling: No Undermining: No Wound Description Classification: Grade 2 Wound Margin: Distinct, outline attached Exudate Amount: Small Exudate Type: Serous Exudate Color: amber Foul Odor After Cleansing: No Slough/Fibrino Yes Wound Bed Granulation Amount: Large (67-100%) Exposed Structure Granulation Quality: Pink Fascia Exposed: No Necrotic Amount: None Present (0%) Fat Layer (Subcutaneous Tissue) Exposed: Yes Tendon Exposed: No Muscle Exposed: No Joint Exposed: No Bone Exposed: No Assessment Notes callous noted to periwound. Treatment Notes Wound #1 (Foot) Wound Laterality: Right, Lateral Cleanser Soap and Water Discharge Instruction: May shower and wash wound with dial antibacterial soap and water prior to dressing change. Peri-Wound Care Topical Primary Dressing Hydrofera Blue Ready Foam, 2.5 x2.5 in Discharge Instruction: Apply to wound bed as instructed Secondary Dressing Woven Gauze Sponge, Non-Sterile 4x4 in Discharge Instruction: Apply over primary dressing as directed. Secured With [employer], 4.5x3.1 (in/yd) Discharge Instruction: Secure with Kerlix as directed. Paper Tape, 2x10 (in/yd) Discharge Instruction: Secure dressing with tape as directed. Compression Wrap Compression Stockings Add-Ons Electronic Signature(s) Signed: 12/23/2020 6:32:06 PM By: Deon Pilling Signed: 12/24/2020 5:56:51 PM By: [name] Entered By: Deon Pilling on 12/23/2020 15:09:32 -------------------------------------------------------------------------------- Wound Assessment Details Patient Name: Date of Service: [name] 12/23/2020 2:30 PM Medical Record Number: [id number] Patient  Account Number: [id number] Date of Birth/Sex: Treating [name]: [birth year] (60 y.o. [name], [name] Primary Care [place]: [name] Other Clinician: Referring Macedonio Scallon: Treating Liadan Guizar/Extender: [name] in Treatment: 7 Wound Status Wound Number: 2 Primary Diabetic Wound/Ulcer of the Lower Extremity Etiology: Wound Location: Right T Second oe Wound Open Wounding Event: Blister Status: Date Acquired: 11/27/2020 Comorbid Asthma, Sleep Apnea, Peripheral Venous Disease, Type I Weeks Of Treatment: 3 History: Diabetes, Neuropathy Clustered Wound: No Wound Measurements Length: (cm) 0.3 Width: (cm) 0.4 Depth: (cm) 0.1 Area: (cm) 0.094 Volume: (cm) 0.009 % Reduction in Area: 52% % Reduction in Volume: 55% Epithelialization: Medium (34-66%) Tunneling: No Undermining: No Wound Description Classification: Grade 1 Wound Margin: Flat and Intact Exudate Amount: Small Exudate Type: Serosanguineous Exudate Color: red, brown Foul Odor After Cleansing: No Slough/Fibrino Yes Wound Bed Granulation Amount: Medium (34-66%) Exposed Structure Granulation Quality: Pink Fascia Exposed: No Necrotic Amount: Medium (34-66%) Fat Layer (Subcutaneous Tissue) Exposed: Yes Necrotic Quality: Adherent Slough Tendon Exposed: No Muscle Exposed: No Joint Exposed: No Bone Exposed: No Treatment Notes Wound #2 (Toe Second) Wound Laterality: Right Cleanser Soap and Water Discharge Instruction: May shower and wash wound with dial antibacterial soap and water prior to dressing change. Peri-Wound Care Topical Primary Dressing Maxorb  Extra Calcium Alginate 2x2 in Discharge Instruction: Apply calcium alginate to wound bed as instructed Secondary Dressing Woven Gauze Sponges 2x2 in Discharge Instruction: Apply over primary dressing as directed. Secured With Conforming Stretch Gauze Bandage, Sterile 2x75 (in/in) Discharge Instruction: Secure with stretch gauze as directed. Paper Tape, 1x10 (in/yd) Discharge Instruction: Secure dressing with tape as directed. Compression Wrap Compression Stockings Add-Ons Electronic Signature(s) Signed: 12/23/2020 6:32:06 PM By: Deon Pilling Signed: 12/24/2020 5:56:51 PM By: [name] Entered By: Deon Pilling on 12/23/2020 15:10:03 -------------------------------------------------------------------------------- Wound Assessment Details Patient Name: Date of Service: [name] 12/23/2020 2:30 PM Medical Record Number: [id number] Patient Account Number: [id number] Date of Birth/Sex: Treating [name]: [name] (60 y.o. [name], [name] Primary Care Kodee Ravert: [name] Other Clinician: Referring Katalena Malveaux: Treating Jadarion Halbig/Extender: [name] in Treatment: 7 Wound Status Wound Number: 3 Primary Diabetic Wound/Ulcer of the Lower Extremity Etiology: Wound Location: Right T Third oe Wound Open Wounding Event: Blister Status: Date Acquired: 11/27/2020 Comorbid Asthma, Sleep Apnea, Peripheral Venous Disease, Type I Weeks Of Treatment: 3 History: Diabetes, Neuropathy Clustered Wound: No Wound Measurements Length: (cm) 0.3 Width: (cm) 0.3 Depth: (cm) 0.1 Area: (cm) 0.071 Volume: (cm) 0.007 % Reduction in Area: 99% % Reduction in Volume: 99% Epithelialization: None Tunneling: No Undermining: No Wound Description Classification: Grade 1 Wound Margin: Flat and Intact Exudate Amount: Medium Exudate Type: Serosanguineous Exudate Color: red, brown Foul Odor After Cleansing: No Slough/Fibrino Yes Wound  Bed Granulation Amount: Large (67-100%) Exposed Structure Granulation Quality: Red Fascia Exposed: No Necrotic Amount: Small (1-33%) Fat Layer (Subcutaneous Tissue) Exposed: Yes Necrotic Quality: Adherent Slough Tendon Exposed: No Muscle Exposed: No Joint Exposed: No Bone Exposed: No Treatment Notes Wound #3 (Toe Third) Wound Laterality: Right Cleanser Soap and Water Discharge Instruction: May shower and wash wound with dial antibacterial soap and water prior to dressing change. Peri-Wound Care Topical Primary Dressing Maxorb Extra Calcium Alginate 2x2 in Discharge Instruction: Apply calcium alginate to wound bed as instructed Secondary Dressing Woven Gauze Sponges 2x2 in Discharge Instruction: Apply over primary dressing as directed. Secured With Conforming Stretch Gauze Bandage, Sterile 2x75 (in/in) Discharge Instruction: Secure with stretch gauze as directed. Paper Tape, 1x10 (in/yd) Discharge Instruction: Secure dressing with  tape as directed. Compression Wrap Compression Stockings Add-Ons Electronic Signature(s) Signed: 12/23/2020 6:32:06 PM By: Deon Pilling Signed: 12/24/2020 5:56:51 PM By: [name] Entered By: Deon Pilling on 12/23/2020 15:10:23 -------------------------------------------------------------------------------- Wound Assessment Details Patient Name: Date of Service: [name] 12/23/2020 2:30 PM Medical Record Number: [id number] Patient Account Number: [id number] Date of Birth/Sex: Treating [name]: [name] (60 y.o. [name], [name] Primary Care Alajah Witman: [name] Other Clinician: Referring Xitlally Mooneyham: Treating Latoyna Hird/Extender: [name] in Treatment: 7 Wound Status Wound Number: 4 Primary Diabetic Wound/Ulcer of the Lower Extremity Etiology: Wound Location: Right T Fourth oe Wound Open Wounding Event: Blister Status: Date Acquired: 11/27/2020 Comorbid Asthma, Sleep Apnea,  Peripheral Venous Disease, Type I Weeks Of Treatment: 3 History: Diabetes, Neuropathy Clustered Wound: No Wound Measurements Length: (cm) 0.5 Width: (cm) 0.6 Depth: (cm) 0.1 Area: (cm) 0.236 Volume: (cm) 0.024 % Reduction in Area: 94% % Reduction in Volume: 96.9% Epithelialization: None Tunneling: No Undermining: No Wound Description Classification: Grade 2 Wound Margin: Flat and Intact Exudate Amount: Medium Exudate Type: Serosanguineous Exudate Color: red, brown Foul Odor After Cleansing: No Slough/Fibrino Yes Wound Bed Granulation Amount: Large (67-100%) Exposed Structure Granulation Quality: Red, Hyper-granulation Fascia Exposed: No Necrotic Amount: None Present (0%) Fat Layer (Subcutaneous Tissue) Exposed: Yes Tendon Exposed: No Muscle Exposed: No Joint Exposed: No Bone Exposed: Yes Treatment Notes Wound #4 (Toe Fourth) Wound Laterality: Right Cleanser Soap and Water Discharge Instruction: May shower and wash wound with dial antibacterial soap and water prior to dressing change. Peri-Wound Care Topical Primary Dressing Maxorb Extra Calcium Alginate 2x2 in Discharge Instruction: Apply calcium alginate to wound bed as instructed Secondary Dressing Woven Gauze Sponges 2x2 in Discharge Instruction: Apply over primary dressing as directed. Secured With Conforming Stretch Gauze Bandage, Sterile 2x75 (in/in) Discharge Instruction: Secure with stretch gauze as directed. Paper Tape, 1x10 (in/yd) Discharge Instruction: Secure dressing with tape as directed. Compression Wrap Compression Stockings Add-Ons Electronic Signature(s) Signed: 12/23/2020 6:32:06 PM By: Deon Pilling Signed: 12/24/2020 5:56:51 PM By: [name] Entered By: Deon Pilling on 12/23/2020 15:10:43 -------------------------------------------------------------------------------- Vitals Details Patient Name: Date of Service: [name]. 12/23/2020 2:30 PM Medical Record Number:  [id number] Patient Account Number: [id number] Date of Birth/Sex: Treating [name]: [name] (60 y.o. [name], [name] Primary Care Elbie Statzer: [name] Other Clinician: Referring Ayvin Lipinski: Treating Elyana Grabski/Extender: [name] in Treatment: 7 Vital Signs Time Taken: 14:42 Temperature (F): 98.0 Height (in): 64 Pulse (bpm): 79 Weight (lbs): 165 Respiratory Rate (breaths/min): 18 Body Mass Index (BMI): 28.3 Blood Pressure (mmHg): 111/73 Reference Range: 80 - 120 mg / dl Electronic Signature(s) Signed: 12/23/2020 3:15:52 PM By: [name] Entered By: [name] on 12/23/2020 14:42:37

## 2020-12-24 NOTE — Progress Notes (Signed)
[name], KANU ([id number]) Visit Report for 12/23/2020 HPI Details Patient Name: Date of Service: [name], [name] 12/23/2020 2:30 PM Medical Record Number: [id number] Patient Account Number: [id number] Date of Birth/Sex: Treating RN: August 17, 1961 (60 y.o. [name], [name] Primary Care Provider: Juluis Rainier Other Clinician: Referring Provider: Treating Provider/Extender: Arelia Longest in Treatment: 7 History of Present Illness HPI Description: ADMISSION 11/03/2020 this is a 60 year old woman with type 2 diabetes. She has severe peripheral neuropathy and autonomic neuropathy. 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 Her problem started at the beginning of July her husband noted a bleeding area in her sock. Unfortunately this became infected requiring admission to hospital from 7/12 through 06/13/2020. An MRI was negative for osteo she received IV antibiotics and was discharged on doxycycline. She did have standard arterial ABIs and TBI's but I did not see any waveforms. Fortunately the ABIs and TBI's were normal at 1.09 and 0.69 on the right and 1.01 and 1.02 on the left respectively. Her last visit with Dr. Allena Katz of podiatry was on 07/04/2020. At 1 which time the measurements were 1.1 x 0.8 x 0.3 she was using a dry dressing. She went on to see Dr. Grandville Silos [facility] on 8/[facility] at which time the wound was debrided and they were prescribed Santyl. The wound is actually on the lateral foot over the base of the fifth metatarsal. She had an additional wound on her right heel but this closed over. They are currently soaking the foot in Epson salts and using Santyl and they have been doing this for quite a period of time. Past medical history includes type 1 diabetes managed with an insulin pump, movement disorder, hypothyroidism, obstructive sleep apnea. She has a cam boot and she wears this religiously  spending most of her day currently in bed but she needs the boot on to get up to use her bedside commode ABIs were done during the hospitalization in July that showed the numbers quoted above 12/13; right lateral foot which is in the setting of an inverted ankle and likely to be this patient's full weightbearing surface. There is some eschar and slough around the wound circumference but in general the wound surface does not look too bad. She has new diabetic shoes and a brace for when this heals hopefully this will maintain skin integrity. Other than that I think she would need an attempt at surgery if a procedure is available to try and correct this weightbearing to form 12/20; right lateral foot in the setting of a severely inverted ankle. The patient is minimally ambulatory at this point. The areas over the right fifth metatarsal base. 12/01/2020; her right lateral foot wound looks a lot better however she comes in today with full de-epithelialized over the right second third and fourth toes with intense erythema and the erythema is spreading into the dorsal foot. Her husband is able to show me pictures which seems to start around Christmas day. She is insensate and therefore does not feel pain. She has not had any systemic symptoms including fever chills. Her CBGs have been in the usual range for her. She has an allergy to penicillin based on a childhood issue but they tell me she has had ampicillin or amoxicillin since then and tolerated it well other than the fact that most antibiotics seem to cause her nausea 1/10; right lateral foot continues to improve. The area of cellulitis  on the right dorsal foot and the erythema on the second third and fourth toes caused me to put her on empiric cephalexin last time. There was nothing to culture. T oday she comes in with erythema receded from the dorsal foot but still markedly erythematous second third and fourth toes. She has wounds especially between  the third and fourth but also an area between the second and third. She also has what looks to be a paronychia a developing on the right medial first toe 1/18; her original wound on the right lateral foot looks about the same somewhat dry. While we are taking care of this she developed intense cellulitis involving the second third and fourth toes spreading into the forefoot. She had 10 days of cephalexin and things have generally improved quite a bit and the second and third toes dorsal foot but not the fourth toe. She has a substantial wound on the fourth toe medially with now exposed bone. We have been using silver alginate here. Hydrofera Blue to the lateral foot 1/25; her original wound on the right lateral foot at the fifth metatarsal base continues to contract this looks healthy. The subsequent wounds on her right fourth third and second toes continue to be problematic. The BONE SCRAPING PCR on the right fourth toe medial aspect last week showed MSSA and she is now on Bactrim I may continue this for at least a month. We are using silver alginate on the toes Hydrofera Blue on the right lateral foot Electronic Signature(s) Signed: 12/23/2020 5:14:25 PM By: Baltazar Najjar MD Entered By: Baltazar Najjar on 12/23/2020 16:59:44 -------------------------------------------------------------------------------- Physical Exam Details Patient Name: Date of Service: [name] 12/23/2020 2:30 PM Medical Record Number: [id number] Patient Account Number: [id number] Date of Birth/Sex: Treating RN: Dec 14, 1960 (60 y.o. [name], [name] Primary Care Provider: Juluis Rainier Other Clinician: Referring Provider: Treating Provider/Extender: Arelia Longest in Treatment: 7 Constitutional Sitting or standing Blood Pressure is within target range for patient.. Pulse regular and within target range for patient.Marland Kitchen Respirations regular, non-labored and within target range..  Temperature is normal and within the target range for the patient.Marland Kitchen Appears in no distress. Notes Wound exam Right lateral foot continues to contract. Healthy looking wound bed but somewhat pale The right forefoot erythremia has resolved although there is still some pink irritation of the dorsal aspect of all her toes although the right fourth toe looks a lot less angry than last time there is wounds on the medial aspect of the fourth toe still probes to bone. There is area of on the second and third toe lateral third and medial second about the same. Electronic Signature(s) Signed: 12/23/2020 5:14:25 PM By: Baltazar Najjar MD Entered By: Baltazar Najjar on 12/23/2020 17:01:36 -------------------------------------------------------------------------------- Physician Orders Details Patient Name: Date of Service: [name] 12/23/2020 2:30 PM Medical Record Number: [id number] Patient Account Number: [id number] Date of Birth/Sex: Treating RN: 12/27/1960 (60 y.o. [name], [name] Primary Care Provider: Juluis Rainier Other Clinician: Referring Provider: Treating Provider/Extender: Arelia Longest in Treatment: 7 Verbal / Phone Orders: No Diagnosis Coding Follow-up Appointments Return Appointment in 1 week. Bathing/ Shower/ Hygiene May shower and wash wound with soap and water. - on days that dressing is changed Off-Loading Removable cast walker boot to: - right foot Other: - Minimal weight bearing on right foot Wound Treatment Wound #1 - Foot Wound Laterality: Right, Lateral Cleanser: Soap and Water 1 x Per Day/7 Days Discharge Instructions: May shower and  wash wound with dial antibacterial soap and water prior to dressing change. Prim Dressing: Hydrofera Blue Ready Foam, 2.5 x2.5 in (Generic) 1 x Per Day/7 Days ary Discharge Instructions: Apply to wound bed as instructed Secondary Dressing: Woven Gauze Sponge, Non-Sterile 4x4 in (Generic) 1 x Per  Day/7 Days Discharge Instructions: Apply over primary dressing as directed. Secured With: [employer], 4.5x3.1 (in/yd) (Generic) 1 x Per Day/7 Days Discharge Instructions: Secure with Kerlix as directed. Secured With: Paper Tape, 2x10 (in/yd) (Generic) 1 x Per Day/7 Days Discharge Instructions: Secure dressing with tape as directed. Wound #2 - T Second oe Wound Laterality: Right Cleanser: Soap and Water 1 x Per Day/7 Days Discharge Instructions: May shower and wash wound with dial antibacterial soap and water prior to dressing change. Prim Dressing: Maxorb Extra Calcium Alginate 2x2 in 1 x Per Day/7 Days ary Discharge Instructions: Apply calcium alginate to wound bed as instructed Secondary Dressing: Woven Gauze Sponges 2x2 in 1 x Per Day/7 Days Discharge Instructions: Apply over primary dressing as directed. Secured With: Insurance underwriter, Sterile 2x75 (in/in) 1 x Per Day/7 Days Discharge Instructions: Secure with stretch gauze as directed. Secured With: Paper Tape, 1x10 (in/yd) 1 x Per Day/7 Days Discharge Instructions: Secure dressing with tape as directed. Wound #3 - T Third oe Wound Laterality: Right Cleanser: Soap and Water 1 x Per Day/7 Days Discharge Instructions: May shower and wash wound with dial antibacterial soap and water prior to dressing change. Prim Dressing: Maxorb Extra Calcium Alginate 2x2 in 1 x Per Day/7 Days ary Discharge Instructions: Apply calcium alginate to wound bed as instructed Secondary Dressing: Woven Gauze Sponges 2x2 in 1 x Per Day/7 Days Discharge Instructions: Apply over primary dressing as directed. Secured With: Insurance underwriter, Sterile 2x75 (in/in) 1 x Per Day/7 Days Discharge Instructions: Secure with stretch gauze as directed. Secured With: Paper Tape, 1x10 (in/yd) 1 x Per Day/7 Days Discharge Instructions: Secure dressing with tape as directed. Wound #4 - T Fourth oe Wound Laterality: Right Cleanser:  Soap and Water 1 x Per Day/7 Days Discharge Instructions: May shower and wash wound with dial antibacterial soap and water prior to dressing change. Prim Dressing: Maxorb Extra Calcium Alginate 2x2 in 1 x Per Day/7 Days ary Discharge Instructions: Apply calcium alginate to wound bed as instructed Secondary Dressing: Woven Gauze Sponges 2x2 in 1 x Per Day/7 Days Discharge Instructions: Apply over primary dressing as directed. Secured With: Insurance underwriter, Sterile 2x75 (in/in) 1 x Per Day/7 Days Discharge Instructions: Secure with stretch gauze as directed. Secured With: Paper Tape, 1x10 (in/yd) 1 x Per Day/7 Days Discharge Instructions: Secure dressing with tape as directed. Electronic Signature(s) Signed: 12/23/2020 5:14:25 PM By: Baltazar Najjar MD Signed: 12/24/2020 5:56:51 PM By: Fonnie Mu RN Entered By: Fonnie Mu on 12/23/2020 16:00:46 -------------------------------------------------------------------------------- Problem List Details Patient Name: Date of Service: [name] 12/23/2020 2:30 PM Medical Record Number: [id number] Patient Account Number: [id number] Date of Birth/Sex: Treating RN: Jun 20, 1961 (60 y.o. [name], [name] Primary Care Provider: Juluis Rainier Other Clinician: Referring Provider: Treating Provider/Extender: Arelia Longest in Treatment: 7 Active Problems ICD-10 Encounter Code Description Active Date MDM Diagnosis E10.621 Type 1 diabetes mellitus with foot ulcer 11/03/2020 No Yes L97.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 11/03/2020 No Yes severity E10.42 Type 1 diabetes mellitus with diabetic polyneuropathy 11/03/2020 No Yes L03.115 Cellulitis of right lower limb 12/01/2020 No Yes M14.671 Charcot's joint,  right ankle and foot 11/03/2020 No Yes Inactive Problems Resolved Problems Electronic Signature(s) Signed: 12/23/2020 5:14:25 PM By: Baltazar Najjar MD Entered By: Baltazar Najjar on 12/23/2020 16:47:25 -------------------------------------------------------------------------------- Progress Note Details Patient Name: Date of Service: [name]. 12/23/2020 2:30 PM Medical Record Number: [id number] Patient Account Number: [id number] Date of Birth/Sex: Treating RN: 05-Feb-1961 (60 y.o. [name], [name] Primary Care Provider: Juluis Rainier Other Clinician: Referring Provider: Treating Provider/Extender: Arelia Longest in Treatment: 7 Subjective History of Present Illness (HPI) ADMISSION 11/03/2020 this is a 60 year old woman with type 2 diabetes. She has severe peripheral neuropathy and autonomic neuropathy. 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 Her problem started at the beginning of July her husband noted a bleeding area in her sock. Unfortunately this became infected requiring admission to hospital from 7/12 through 06/13/2020. An MRI was negative for osteo she received IV antibiotics and was discharged on doxycycline. She did have standard arterial ABIs and TBI's but I did not see any waveforms. Fortunately the ABIs and TBI's were normal at 1.09 and 0.69 on the right and 1.01 and 1.02 on the left respectively. Her last visit with Dr. Allena Katz of podiatry was on 07/04/2020. At 1 which time the measurements were 1.1 x 0.8 x 0.3 she was using a dry dressing. She went on to see Dr. Grandville S[facility] on [facility] at which time the wound was debrided and they were prescribed Santyl. The wound is actually on the lateral foot over the base of the fifth metatarsal. She had an additional wound on her right heel but this closed over. They are currently soaking the foot in Epson salts and using Santyl and they have been doing this for quite a period of time. Past medical history includes type 1 diabetes managed with an insulin pump, movement  disorder, hypothyroidism, obstructive sleep apnea. She has a cam boot and she wears this religiously spending most of her day currently in bed but she needs the boot on to get up to use her bedside commode ABIs were done during the hospitalization in July that showed the numbers quoted above 12/13; right lateral foot which is in the setting of an inverted ankle and likely to be this patient's full weightbearing surface. There is some eschar and slough around the wound circumference but in general the wound surface does not look too bad. She has new diabetic shoes and a brace for when this heals hopefully this will maintain skin integrity. Other than that I think she would need an attempt at surgery if a procedure is available to try and correct this weightbearing to form 12/20; right lateral foot in the setting of a severely inverted ankle. The patient is minimally ambulatory at this point. The areas over the right fifth metatarsal base. 12/01/2020; her right lateral foot wound looks a lot better however she comes in today with full de-epithelialized over the right second third and fourth toes with intense erythema and the erythema is spreading into the dorsal foot. Her husband is able to show me pictures which seems to start around Christmas day. She is insensate and therefore does not feel pain. She has not had any systemic symptoms including fever chills. Her CBGs have been in the usual range for her. She has an allergy to penicillin based on a childhood issue but they tell me she has had ampicillin or amoxicillin since then and tolerated it well  other than the fact that most antibiotics seem to cause her nausea 1/10; right lateral foot continues to improve. ooThe area of cellulitis on the right dorsal foot and the erythema on the second third and fourth toes caused me to put her on empiric cephalexin last time. There was nothing to culture. T oday she comes in with erythema receded from the dorsal  foot but still markedly erythematous second third and fourth toes. She has wounds especially between the third and fourth but also an area between the second and third. She also has what looks to be a paronychia a developing on the right medial first toe 1/18; her original wound on the right lateral foot looks about the same somewhat dry. While we are taking care of this she developed intense cellulitis involving the second third and fourth toes spreading into the forefoot. She had 10 days of cephalexin and things have generally improved quite a bit and the second and third toes dorsal foot but not the fourth toe. She has a substantial wound on the fourth toe medially with now exposed bone. We have been using silver alginate here. Hydrofera Blue to the lateral foot 1/25; her original wound on the right lateral foot at the fifth metatarsal base continues to contract this looks healthy. The subsequent wounds on her right fourth third and second toes continue to be problematic. The BONE SCRAPING PCR on the right fourth toe medial aspect last week showed MSSA and she is now on Bactrim I may continue this for at least a month. We are using silver alginate on the toes Hydrofera Blue on the right lateral foot Objective Constitutional Sitting or standing Blood Pressure is within target range for patient.. Pulse regular and within target range for patient.Marland Kitchen. Respirations regular, non-labored and within target range.. Temperature is normal and within the target range for the patient.Marland Kitchen. Appears in no distress. Vitals Time Taken: 2:42 PM, Height: 64 in, Weight: 165 lbs, BMI: 28.3, Temperature: 98.0 F, Pulse: 79 bpm, Respiratory Rate: 18 breaths/min, Blood Pressure: 111/73 mmHg. General Notes: Wound exam ooRight lateral foot continues to contract. Healthy looking wound bed but somewhat pale ooThe right forefoot erythremia has resolved although there is still some pink irritation of the dorsal aspect of all her  toes although the right fourth toe looks a lot less angry than last time there is wounds on the medial aspect of the fourth toe still probes to bone. There is area of on the second and third toe lateral third and medial second about the same. Integumentary (Hair, Skin) Wound #1 status is Open. Original cause of wound was Trauma. The wound is located on the Right,Lateral Foot. The wound measures 0.3cm length x 0.3cm width x 0.1cm depth; 0.071cm^2 area and 0.007cm^3 volume. There is Fat Layer (Subcutaneous Tissue) exposed. There is no tunneling or undermining noted. There is a small amount of serous drainage noted. The wound margin is distinct with the outline attached to the wound base. There is large (67-100%) pink granulation within the wound bed. There is no necrotic tissue within the wound bed. General Notes: callous noted to periwound. Wound #2 status is Open. Original cause of wound was Blister. The wound is located on the Right T Second. The wound measures 0.3cm length x 0.4cm oe width x 0.1cm depth; 0.094cm^2 area and 0.009cm^3 volume. There is Fat Layer (Subcutaneous Tissue) exposed. There is no tunneling or undermining noted. There is a small amount of serosanguineous drainage noted. The wound margin is flat  and intact. There is medium (34-66%) pink granulation within the wound bed. There is a medium (34-66%) amount of necrotic tissue within the wound bed including Adherent Slough. Wound #3 status is Open. Original cause of wound was Blister. The wound is located on the Right T Third. The wound measures 0.3cm length x 0.3cm width x oe 0.1cm depth; 0.071cm^2 area and 0.007cm^3 volume. There is Fat Layer (Subcutaneous Tissue) exposed. There is no tunneling or undermining noted. There is a medium amount of serosanguineous drainage noted. The wound margin is flat and intact. There is large (67-100%) red granulation within the wound bed. There is a small (1-33%) amount of necrotic tissue within  the wound bed including Adherent Slough. Wound #4 status is Open. Original cause of wound was Blister. The wound is located on the Right T Fourth. The wound measures 0.5cm length x 0.6cm width oe x 0.1cm depth; 0.236cm^2 area and 0.024cm^3 volume. There is bone and Fat Layer (Subcutaneous Tissue) exposed. There is no tunneling or undermining noted. There is a medium amount of serosanguineous drainage noted. The wound margin is flat and intact. There is large (67-100%) red, hyper - granulation within the wound bed. There is no necrotic tissue within the wound bed. Assessment Active Problems ICD-10 Type 1 diabetes mellitus with foot ulcer Non-pressure chronic ulcer of other part of right foot with other specified severity Type 1 diabetes mellitus with diabetic polyneuropathy Cellulitis of right lower limb Charcot's joint, right ankle and foot Plan Follow-up Appointments: Return Appointment in 1 week. Bathing/ Shower/ Hygiene: May shower and wash wound with soap and water. - on days that dressing is changed Off-Loading: Removable cast walker boot to: - right foot Other: - Minimal weight bearing on right foot WOUND #1: - Foot Wound Laterality: Right, Lateral Cleanser: Soap and Water 1 x Per Day/7 Days Discharge Instructions: May shower and wash wound with dial antibacterial soap and water prior to dressing change. Prim Dressing: Hydrofera Blue Ready Foam, 2.5 x2.5 in (Generic) 1 x Per Day/7 Days ary Discharge Instructions: Apply to wound bed as instructed Secondary Dressing: Woven Gauze Sponge, Non-Sterile 4x4 in (Generic) 1 x Per Day/7 Days Discharge Instructions: Apply over primary dressing as directed. Secured With: [employer], 4.5x3.1 (in/yd) (Generic) 1 x Per Day/7 Days Discharge Instructions: Secure with Kerlix as directed. Secured With: Paper T ape, 2x10 (in/yd) (Generic) 1 x Per Day/7 Days Discharge Instructions: Secure dressing with tape as directed. WOUND #2: - T  Second oe Wound Laterality: Right Cleanser: Soap and Water 1 x Per Day/7 Days Discharge Instructions: May shower and wash wound with dial antibacterial soap and water prior to dressing change. Prim Dressing: Maxorb Extra Calcium Alginate 2x2 in 1 x Per Day/7 Days ary Discharge Instructions: Apply calcium alginate to wound bed as instructed Secondary Dressing: Woven Gauze Sponges 2x2 in 1 x Per Day/7 Days Discharge Instructions: Apply over primary dressing as directed. Secured With: Insurance underwriter, Sterile 2x75 (in/in) 1 x Per Day/7 Days Discharge Instructions: Secure with stretch gauze as directed. Secured With: Paper Tape, 1x10 (in/yd) 1 x Per Day/7 Days Discharge Instructions: Secure dressing with tape as directed. WOUND #3: - T Third Wound Laterality: Right oe Cleanser: Soap and Water 1 x Per Day/7 Days Discharge Instructions: May shower and wash wound with dial antibacterial soap and water prior to dressing change. Prim Dressing: Maxorb Extra Calcium Alginate 2x2 in 1 x Per Day/7 Days ary Discharge Instructions: Apply calcium alginate to wound bed as instructed Secondary  Dressing: Woven Gauze Sponges 2x2 in 1 x Per Day/7 Days Discharge Instructions: Apply over primary dressing as directed. Secured With: Insurance underwriter, Sterile 2x75 (in/in) 1 x Per Day/7 Days Discharge Instructions: Secure with stretch gauze as directed. Secured With: Paper Tape, 1x10 (in/yd) 1 x Per Day/7 Days Discharge Instructions: Secure dressing with tape as directed. WOUND #4: - T Fourth Wound Laterality: Right oe Cleanser: Soap and Water 1 x Per Day/7 Days Discharge Instructions: May shower and wash wound with dial antibacterial soap and water prior to dressing change. Prim Dressing: Maxorb Extra Calcium Alginate 2x2 in 1 x Per Day/7 Days ary Discharge Instructions: Apply calcium alginate to wound bed as instructed Secondary Dressing: Woven Gauze Sponges 2x2 in 1 x Per  Day/7 Days Discharge Instructions: Apply over primary dressing as directed. Secured With: Insurance underwriter, Sterile 2x75 (in/in) 1 x Per Day/7 Days Discharge Instructions: Secure with stretch gauze as directed. Secured With: Paper Tape, 1x10 (in/yd) 1 x Per Day/7 Days Discharge Instructions: Secure dressing with tape as directed. 1. She is now on Septra DS relating to the MSSA in the right second toe. This is via PCR bone scraping 2. The irritation on her toes I wonder is a reaction to the silver and silver alginate I have therefore changed her to calcium alginate 3. I may continue the antibiotics for up to 4 weeks here. 4 she does not have an arterial issue via noninvasive tests in the middle of last summer 5. Her original wound actually looks quite good still using Hydrofera Blue 6. Keep her toes separated, the patient's husband is aware of this Electronic Signature(s) Signed: 12/23/2020 5:14:25 PM By: Baltazar Najjar MD Entered By: Baltazar Najjar on 12/23/2020 17:03:18 -------------------------------------------------------------------------------- SuperBill Details Patient Name: Date of Service: [name] 12/23/2020 Medical Record Number: [id number] Patient Account Number: [id number] Date of Birth/Sex: Treating RN: [id number] (60 y.o. [name], [name] Primary Care Provider: Juluis Rainier Other Clinician: Referring Provider: Treating Provider/Extender: Arelia Longest in Treatment: 7 Diagnosis Coding ICD-10 Codes Code Description [phone] Type 1 diabetes mellitus with foot ulcer L97.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 severity E10.42 Type 1 diabetes mellitus with diabetic polyneuropathy L03.115 Cellulitis of right lower limb M14.671 Charcot's joint, right ankle and foot Physician Procedures : CPT4 Code Description Modifier [id number] - WC PHYS LEVEL 4 - EST PT ICD-10 Diagnosis  Description E10.621 Type 1 diabetes mellitus with foot ulcer L97.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 severity L03.115  Cellulitis of right lower limb Quantity: 1 Electronic Signature(s) Signed: 12/23/2020 5:14:25 PM By: Baltazar Najjar MD Entered By: Baltazar Najjar on 12/23/2020 17:03:53

## 2020-12-25 ENCOUNTER — Encounter (INDEPENDENT_AMBULATORY_CARE_PROVIDER_SITE_OTHER): Payer: Self-pay | Admitting: Ophthalmology

## 2020-12-25 ENCOUNTER — Ambulatory Visit (INDEPENDENT_AMBULATORY_CARE_PROVIDER_SITE_OTHER): Payer: Managed Care, Other (non HMO) | Admitting: Ophthalmology

## 2020-12-25 ENCOUNTER — Other Ambulatory Visit: Payer: Self-pay

## 2020-12-25 DIAGNOSIS — E103591 Type 1 diabetes mellitus with proliferative diabetic retinopathy without macular edema, right eye: Secondary | ICD-10-CM | POA: Diagnosis not present

## 2020-12-25 NOTE — Assessment & Plan Note (Signed)
Additional PRP delivered today

## 2020-12-25 NOTE — Patient Instructions (Signed)
Patient instructed to contact the office promptly for new onset visual acuity declines or demeanor

## 2020-12-25 NOTE — Progress Notes (Signed)
12/25/2020     CHIEF COMPLAINT Patient presents for Retina Follow Up (1 WK PRP OD (last here 12/23/20). ///Pt reports stable vision OD, pt denies any new F/F, pain or pressure OD. **Pt requesting recommendation for another drop for in between Restasis doses.**///Last BS 143 this AM )   HISTORY OF PRESENT ILLNESS: [name] is a 59 y.o. female who presents to the clinic today for:   HPI    Retina Follow Up    In right eye.  This started 2 days ago.  Duration of 2 days. Additional comments: 1 WK PRP OD (last here 12/23/20).    Pt reports stable vision OD, pt denies any new F/F, pain or pressure OD. **Pt requesting recommendation for another drop for in between Restasis doses.**   Last BS 143 this AM        Last edited by Varney Biles D on 12/25/2020 10:03 AM. (History)      Referring physician: Juluis Rainier, MD [address] [place],  [place] [address]  HISTORICAL INFORMATION:   Selected notes from the MEDICAL RECORD NUMBER    Lab Results  Component Value Date   HGBA1C 5.0 06/10/2020     CURRENT MEDICATIONS: Current Outpatient Medications (Ophthalmic Drugs)  Medication Sig  . Carboxymethylcellul-Glycerin (REFRESH OPTIVE OP) Place 1 drop into both eyes as needed (dry eyes).   . cycloSPORINE (RESTASIS) 0.05 % ophthalmic emulsion Place 1 drop into both eyes 2 (two) times daily.    No current facility-administered medications for this visit. (Ophthalmic Drugs)   Current Outpatient Medications (Other)  Medication Sig  . albuterol (PROVENTIL HFA;VENTOLIN HFA) 108 (90 BASE) MCG/ACT inhaler Inhale 1 puff into the lungs as needed for wheezing or shortness of breath.  Marland Kitchen ammonium lactate (AMLACTIN) 12 % lotion Apply 1 application topically as needed for dry skin.  Marland Kitchen aspirin 81 MG chewable tablet Chew 81 mg by mouth daily.  . Choline Fenofibrate (FENOFIBRIC ACID) 45 MG CPDR Take 45 mg by mouth 3 (three) times daily.  . Continuous Blood Gluc Sensor (DEXCOM G6  SENSOR) MISC SMARTSIG:1 Each Topical Every 10 Days  . Continuous Blood Gluc Transmit (DEXCOM G6 TRANSMITTER) MISC   . DULERA 100-5 MCG/ACT AERO Inhale 2 puffs into the lungs 2 (two) times daily.  Marland Kitchen escitalopram (LEXAPRO) 10 MG tablet Take 10 mg by mouth 2 (two) times daily.   Marland Kitchen estradiol (ESTRACE) 2 MG tablet Take 2 mg by mouth daily.  . famotidine (PEPCID) 10 MG tablet Take 10 mg by mouth 2 (two) times daily.  Marland Kitchen gabapentin (NEURONTIN) 300 MG capsule Take 1 capsule (300 mg total) by mouth 3 (three) times daily.  . hydrocortisone 2.5 % cream Apply 1 application topically daily as needed (itching).   . Insulin Human (INSULIN PUMP) SOLN Inject 1 each into the skin. Insulin pump MEDTRONIC with humalog (Will be changing to TANDEM).Works with DEXCOM 4 CGM  . insulin lispro (HUMALOG) 100 UNIT/ML injection Has with insulin pump  . lamoTRIgine (LAMICTAL) 150 MG tablet TAKE 1 TABLET BY MOUTH TWICE DAILY (Patient taking differently: Take 150 mg by mouth 2 (two) times daily. )  . lansoprazole (PREVACID) 30 MG capsule Take 30 mg by mouth 2 (two) times daily before a meal.  . levocetirizine (XYZAL) 5 MG tablet Take 5 mg by mouth 2 (two) times daily.   Marland Kitchen levothyroxine (SYNTHROID, LEVOTHROID) 112 MCG tablet Take 112 mcg by mouth daily.  . mometasone (NASONEX) 50 MCG/ACT nasal spray Place 2 sprays into  the nose daily. OTC  . montelukast (SINGULAIR) 10 MG tablet Take 10 mg by mouth at bedtime.  . Multiple Vitamins-Minerals (MULTIVITAMIN GUMMIES ADULT PO) Take 2 each by mouth daily.   . Pancrelipase, Lip-Prot-Amyl, (PANCREAZE PO) Take 3-4 capsules by mouth See admin instructions. Take 4 capsules with a meal and 3 capsules with a snack  . primidone (MYSOLINE) 50 MG tablet TAKE 1 TABLET BY MOUTH IN THE MORNING , 1 TABLET AT NOON AND ONE AND ONE-HALF (1 & 1/2) TABLETS IN THE EVENING (Patient taking differently: Take 50-75 mg by mouth See admin instructions. Take 50mg  in the morning, 50mg  at noon and 75mg  in the  evening.)  . rosuvastatin (CRESTOR) 40 MG tablet Take 40 mg by mouth every evening.   . Simethicone (GAS-X EXTRA STRENGTH PO) Take 1 tablet by mouth as needed (gas relief).    No current facility-administered medications for this visit. (Other)      REVIEW OF SYSTEMS:    ALLERGIES Allergies  Allergen Reactions  . Codeine   . Food     MSG and Lactose (cannot have any dairy products)   . Other Nausea And Vomiting    MSG and Lactose Intolerant  . Penicillins     She states she can have penicillin derivatives    PAST MEDICAL HISTORY Past Medical History:  Diagnosis Date  . Adhesive capsulitis 2013   Bilateral shoulders  . Adhesive capsulitis of both shoulders 2013  . Anemia   . Cataract   . Chronic kidney disease   . Depression   . Diabetes mellitus without complication (HCC)   . Diabetic retinopathy (HCC) 2010  . Diabetic retinopathy (HCC) 2010   Left eye, mild  . Essential tremor 2011   Bilateral hands  . Foot drop, bilateral 2007  . Foot fracture, left   . Gastroparesis 2009  . GERD (gastroesophageal reflux disease)   . Headache   . Hemorrhage 2011   Laser surgery x2  . High potassium 2012   Normal as of 2014  . Hypercholesterolemia 2010  . Hyperlipidemia   . Neuropathy   . Pneumonia   . Postnasal drip 2010  . Scratched cornea 2013   Left eye  . Sleep apnea    does not wear CPAP  . Stage 3 chronic kidney disease (HCC)   . Thyroid disease   . TMJ (temporomandibular joint disorder)   . Type 1 diabetes [facility])    Past Surgical History:  Procedure Laterality Date  . ABDOMINAL HYSTERECTOMY  2007  . APPENDECTOMY    . BALLOON DILATION N/A 01/24/2017   Procedure: BALLOON DILATION;  Surgeon: [facility], MD;  Location: [facility];  Service: Endoscopy;  Laterality: N/A;  . CARPAL TUNNEL RELEASE Right 1990  . CATARACT EXTRACTION Bilateral 2008  . COLONOSCOPY  2007  . COLONOSCOPY WITH PROPOFOL N/A 01/24/2017   Procedure: COLONOSCOPY WITH PROPOFOL;   Surgeon: 2009, MD;  Location: [facility];  Service: Endoscopy;  Laterality: N/A;  . ESOPHAGOGASTRODUODENOSCOPY (EGD) WITH PROPOFOL N/A 01/24/2017   Procedure: ESOPHAGOGASTRODUODENOSCOPY (EGD) WITH PROPOFOL;  Surgeon: 01/26/2017, MD;  Location: [facility];  Service: Endoscopy;  Laterality: N/A;  moved oer MD request  . EYE SURGERY Left 2012   Pars plana viterctomy, membrane peel, endolaser  . ROOT CANAL  2007  . ROOT CANAL  2010  . Thorat dilation  2007  . Throat dilation  2007  . Throat dilation  2010, 0ct 2011   Sep 2010, Nov 2010, 2014  .  TONSILLECTOMY  1971  . Trigger fingers  2008  . WISDOM TOOTH EXTRACTION  1977    FAMILY HISTORY Family History  Problem Relation Age of Onset  . Hypertension Mother   . Diabetes Father   . COPD Father   . Pneumonia Father   . Stroke Maternal Grandmother   . Hypertension Maternal Grandmother   . Diabetes Maternal Grandfather   . Parkinson's disease Maternal Grandfather   . Heart disease Maternal Grandfather   . Parkinsonism Neg Hx   . Neuropathy Neg Hx   . Breast cancer Neg Hx     SOCIAL HISTORY Social History   Tobacco Use  . Smoking status: Never Smoker  . Smokeless tobacco: Never Used  Vaping Use  . Vaping Use: Never used  Substance Use Topics  . Alcohol use: No    Alcohol/week: 0.0 standard drinks  . Drug use: No         OPHTHALMIC EXAM:  Base Eye Exam    Visual Acuity (ETDRS)      Right Left   Dist cc 20/25 -1    Correction: Glasses       Tonometry (Tonopen, 10:07 AM)      Right Left   Pressure 16        Pupils      Dark Light Shape React APD   Right 4 4 Round Brisk None   Left 4 4 Round Sluggish None       Visual Fields (Counting fingers)      Left Right    Full    Restrictions  Partial outer inferior nasal deficiency       Extraocular Movement      Right Left    Full Full       Neuro/Psych    Oriented x3: Yes       Dilation    Right eye: 1.0% Mydriacyl, 2.5%  Phenylephrine @ 10:07 AM        Slit Lamp and Fundus Exam    Slit Lamp Exam      Right Left   Lens Centered posterior chamber intraocular lens           IMAGING AND PROCEDURES  Imaging and Procedures for 12/25/20  Panretinal Photocoagulation - OD - Right Eye       Anesthesia Topical anesthesia was used. Anesthetic medications included Proparacaine 0.5%.   Laser Information The type of laser was diode. Color was yellow. The duration in seconds was 0.02. The spot size was 390 microns. Laser power was 300. Total spots was 783.   Post-op The patient tolerated the procedure well. There were no complications. The patient received written and verbal post procedure care education.   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  PRP additional applied today right eye areas posterior and also particularly nasal lid region of recent neovascularization elsewhere triggering small preretinal hemorrhage.  2.  3.  Ophthalmic Meds Ordered this visit:  No orders of the defined types were placed in this encounter.      Return in about 4 months (around 04/24/2021) for DILATE OU, COLOR FP.  Patient Instructions  Patient instructed to contact the office promptly for new onset visual acuity declines or demeanor    Explained the diagnoses, plan, and follow up with the patient and they expressed  understanding.  Patient expressed understanding of the importance of proper follow up care.   Alford Highland Jazzie Trampe M.D. Diseases & Surgery of the Retina and Vitreous [facility] 12/25/20     Abbreviations: M myopia (nearsighted); A astigmatism; H hyperopia (farsighted); P presbyopia; Mrx spectacle prescription;  CTL contact lenses; OD right eye; OS left eye; OU both eyes  XT exotropia; ET esotropia; PEK punctate epithelial keratitis; PEE punctate epithelial erosions; DES dry eye syndrome; MGD meibomian gland dysfunction; ATs artificial tears; PFAT's preservative free artificial tears; NSC nuclear sclerotic cataract; PSC posterior subcapsular cataract; ERM epi-retinal membrane; PVD posterior vitreous detachment; RD retinal detachment; DM diabetes mellitus; DR diabetic retinopathy; NPDR non-proliferative diabetic retinopathy; PDR proliferative diabetic retinopathy; CSME clinically significant macular edema; DME diabetic macular edema; dbh dot blot hemorrhages; CWS cotton wool spot; POAG primary open angle glaucoma; C/D cup-to-disc ratio; HVF humphrey visual field; GVF goldmann visual field; OCT optical coherence tomography; IOP intraocular pressure; BRVO Branch retinal vein occlusion; CRVO central retinal vein occlusion; CRAO central retinal artery occlusion; BRAO branch retinal artery occlusion; RT retinal tear; SB scleral buckle; PPV pars plana vitrectomy; VH Vitreous hemorrhage; PRP panretinal laser photocoagulation; IVK intravitreal kenalog; VMT vitreomacular traction; MH Macular hole;  NVD neovascularization of the disc; NVE neovascularization elsewhere; AREDS age related eye disease study; ARMD age related macular degeneration; POAG primary open angle glaucoma; EBMD epithelial/anterior basement membrane dystrophy; ACIOL anterior chamber intraocular lens; IOL intraocular lens; PCIOL posterior chamber intraocular lens; Phaco/IOL phacoemulsification with intraocular lens placement; PRK  photorefractive keratectomy; LASIK laser assisted in situ keratomileusis; HTN hypertension; DM diabetes mellitus; COPD chronic obstructive pulmonary disease

## 2020-12-29 ENCOUNTER — Other Ambulatory Visit: Payer: Self-pay

## 2020-12-29 ENCOUNTER — Encounter (HOSPITAL_BASED_OUTPATIENT_CLINIC_OR_DEPARTMENT_OTHER): Payer: Managed Care, Other (non HMO) | Admitting: Internal Medicine

## 2020-12-29 DIAGNOSIS — E10621 Type 1 diabetes mellitus with foot ulcer: Secondary | ICD-10-CM | POA: Diagnosis not present

## 2021-01-06 NOTE — Progress Notes (Signed)
[name], [name] ([id number]) Visit Report for 12/29/2020 Arrival Information Details Patient Name: Date of Service: [name], [name] 12/29/2020 8:00 A M Medical Record Number: [id number] Patient Account Number: [id number] Date of Birth/Sex: Treating [name]: August 06, 1961 (60 y.o. [name], [name] Primary Care [name]: [name] Other Clinician: Referring [name]: Treating [name]/Extender: [name] in Treatment: 8 Visit Information History Since Last Visit Added or deleted any medications: No Patient Arrived: Wheel Chair Any new allergies or adverse reactions: No Arrival Time: 07:59 Had a fall or experienced change in No Accompanied By: husband activities of daily living that may affect Transfer Assistance: None risk of falls: Patient Identification Verified: Yes Signs or symptoms of abuse/neglect since last visito No Secondary Verification Process Completed: Yes Hospitalized since last visit: No Patient Requires Transmission-Based Precautions: No Implantable device outside of the clinic excluding No Patient Has Alerts: Yes cellular tissue based products placed in the center Patient Alerts: R ABI 1.09 TBI .69 since last visit: L ABI 1.02 TBI .19 Has Dressing in Place as Prescribed: Yes Pain Present Now: No Electronic Signature(s) Signed: 01/06/2021 1:48:37 PM By: [name] Entered By: [name] on 12/29/2020 07:59:44 -------------------------------------------------------------------------------- Clinic Level of Care Assessment Details Patient Name: Date of Service: [place], [place] C. 12/29/2020 8:00 A M Medical Record Number: [id number] Patient Account Number: [id number] Date of Birth/Sex: Treating [name]: [birth year] (60 y.o. [name] Primary Care [name]: [name] Other Clinician: Referring [name]: Treating [name]/Extender: [name] in Treatment: 8 Clinic Level of Care  Assessment Items TOOL 4 Quantity Score X- 1 0 Use when only an EandM is performed on FOLLOW-UP visit ASSESSMENTS - Nursing Assessment / Reassessment X- 1 10 Reassessment of Co-morbidities (includes updates in patient status) X- 1 5 Reassessment of Adherence to Treatment Plan ASSESSMENTS - Wound and Skin A ssessment / Reassessment []  - 0 Simple Wound Assessment / Reassessment - one wound X- 4 5 Complex Wound Assessment / Reassessment - multiple wounds []  - 0 Dermatologic / Skin Assessment (not related to wound area) ASSESSMENTS - Focused Assessment []  - 0 Circumferential Edema Measurements - multi extremities []  - 0 Nutritional Assessment / Counseling / Intervention X- 1 5 Lower Extremity Assessment (monofilament, tuning fork, pulses) []  - 0 Peripheral Arterial Disease Assessment (using hand held doppler) ASSESSMENTS - Ostomy and/or Continence Assessment and Care []  - 0 Incontinence Assessment and Management []  - 0 Ostomy Care Assessment and Management (repouching, etc.) PROCESS - Coordination of Care X - Simple Patient / Family Education for ongoing care 1 15 []  - 0 Complex (extensive) Patient / Family Education for ongoing care X- 1 10 Staff obtains Programmer, systems, Records, T Results / Process Orders est []  - 0 Staff telephones HHA, Nursing Homes / Clarify orders / etc []  - 0 Routine Transfer to another Facility (non-emergent condition) []  - 0 Routine Hospital Admission (non-emergent condition) []  - 0 New Admissions / Biomedical engineer / Ordering NPWT Apligraf, etc. , []  - 0 Emergency Hospital Admission (emergent condition) X- 1 10 Simple Discharge Coordination []  - 0 Complex (extensive) Discharge Coordination PROCESS - Special Needs []  - 0 Pediatric / Minor Patient Management []  - 0 Isolation Patient Management []  - 0 Hearing / Language / Visual special needs []  - 0 Assessment of Community assistance (transportation, D/C planning, etc.) []  -  0 Additional assistance / Altered mentation []  - 0 Support Surface(s) Assessment (bed, cushion, seat, etc.) INTERVENTIONS - Wound Cleansing / Measurement []  - 0 Simple Wound Cleansing -  one wound X- 4 5 Complex Wound Cleansing - multiple wounds X- 1 5 Wound Imaging (photographs - any number of wounds) []  - 0 Wound Tracing (instead of photographs) []  - 0 Simple Wound Measurement - one wound X- 4 5 Complex Wound Measurement - multiple wounds INTERVENTIONS - Wound Dressings []  - 0 Small Wound Dressing one or multiple wounds X- 1 15 Medium Wound Dressing one or multiple wounds []  - 0 Large Wound Dressing one or multiple wounds X- 1 5 Application of Medications - topical []  - 0 Application of Medications - injection INTERVENTIONS - Miscellaneous []  - 0 External ear exam []  - 0 Specimen Collection (cultures, biopsies, blood, body fluids, etc.) []  - 0 Specimen(s) / Culture(s) sent or taken to Lab for analysis []  - 0 Patient Transfer (multiple staff / Civil Service fast streamer / Similar devices) []  - 0 Simple Staple / Suture removal (25 or less) []  - 0 Complex Staple / Suture removal (26 or more) []  - 0 Hypo / Hyperglycemic Management (close monitor of Blood Glucose) []  - 0 Ankle / Brachial Index (ABI) - do not check if billed separately X- 1 5 Vital Signs Has the patient been seen at the hospital within the last three years: Yes Total Score: 145 Level Of Care: New/Established - Level 4 Electronic Signature(s) Signed: 12/29/2020 5:05:12 PM By: [name] Hurst [name], BSN Entered By: [name] on 12/29/2020 08:35:52 -------------------------------------------------------------------------------- Encounter Discharge Information Details Patient Name: Date of Service: [name] Luria C. 12/29/2020 8:00 A M Medical Record Number: [id number] Patient Account Number: [id number] Date of Birth/Sex: Treating [name]: [birth year] (60 y.o. [name], [name] Primary Care Khadijah Mastrianni: [name]  Other Clinician: Referring Ja Ohman: Treating Mallie Giambra/Extender: [name] in Treatment: 8 Encounter Discharge Information Items Discharge Condition: Stable Ambulatory Status: Wheelchair Discharge Destination: Home Transportation: Private Auto Accompanied By: husband Schedule Follow-up Appointment: Yes Clinical Summary of Care: Patient Declined Electronic Signature(s) Signed: 01/06/2021 1:48:37 PM By: [name] Entered By: [name] on 12/29/2020 08:44:02 -------------------------------------------------------------------------------- Lower Extremity Assessment Details Patient Name: Date of Service: [name] Shy. 12/29/2020 8:00 A M Medical Record Number: [id number] Patient Account Number: [id number] Date of Birth/Sex: Treating [name]: [birth year] (60 y.o. [name], [name] Primary Care Keigen Caddell: [name] Other Clinician: Referring Arvis Miguez: Treating Evola Hollis/Extender: [name] in Treatment: 8 Edema Assessment Assessed: Shirlyn Goltz: No] Patrice Paradise: Yes] Edema: [Left: N] [Right: o] Calf Left: Right: Point of Measurement: 28 cm From Medial Instep 30 cm Ankle Left: Right: Point of Measurement: 10 cm From Medial Instep 18.5 cm Vascular Assessment Pulses: Dorsalis Pedis Palpable: [Right:Yes] Posterior Tibial Palpable: [Right:Yes] Electronic Signature(s) Signed: 01/06/2021 1:48:37 PM By: [name] Entered By: [name] on 12/29/2020 08:04:35 -------------------------------------------------------------------------------- Multi Wound Chart Details Patient Name: Date of Service: [name] Luria C. 12/29/2020 8:00 A M Medical Record Number: [id number] Patient Account Number: [id number] Date of Birth/Sex: Treating [name]: [birth year] (60 y.o. [name] Primary Care Braya Habermehl: [name] Other Clinician: Referring Geraldyn Shain: Treating Izreal Kock/Extender: [name] in Treatment: 8 Vital Signs Height(in): 2 Pulse(bpm): 42 Weight(lbs): 165 Blood Pressure(mmHg): 124/83 Body Mass Index(BMI): 28 Temperature(F): 97.7 Respiratory Rate(breaths/min): 17 Photos: [1:No Photos Right, Lateral Foot] [2:No Photos Right T Second oe] [3:No Photos Right T Third oe] Wound Location: [1:Trauma] [2:Blister] [3:Blister] Wounding Event: [1:Diabetic Wound/Ulcer of the Lower] [2:Diabetic Wound/Ulcer of the Lower] [3:Diabetic Wound/Ulcer of the Lower] Primary Etiology: [1:Extremity Asthma, Sleep Apnea, Peripheral] [2:Extremity Asthma, Sleep Apnea, Peripheral] [3:Extremity Asthma, Sleep Apnea, Peripheral] Comorbid  History: [1:Venous Disease, Type I Diabetes, Neuropathy 06/03/2020] [2:Venous Disease, Type I Diabetes, Neuropathy 11/27/2020] [3:Venous Disease, Type I Diabetes, Neuropathy 11/27/2020] Date Acquired: [1:8] [2:4] [3:4] Weeks of Treatment: [1:Open] [2:Open] [3:Open] Wound Status: [1:0.2x0.2x0.4] [2:0.2x0.2x0.1] [3:0.2x0.2x0.1] Measurements L x W x D (cm) [1:0.031] [2:0.031] [3:0.031] A (cm) : rea [1:0.013] [2:0.003] [3:0.003] Volume (cm) : [1:98.10%] [2:84.20%] [3:99.60%] % Reduction in A rea: [1:91.90%] [2:85.00%] [3:99.60%] % Reduction in Volume: [1:Grade 2] [2:Grade 1] [3:Grade 1] Classification: [1:Medium] [2:Medium] [3:Medium] Exudate A mount: [1:Serous] [2:Serosanguineous] [3:Serosanguineous] Exudate Type: [1:amber] [2:red, brown] [3:red, brown] Exudate Color: [1:Distinct, outline attached] [2:Flat and Intact] [3:Flat and Intact] Wound Margin: [1:Large (67-100%)] [2:Medium (34-66%)] [3:Large (67-100%)] Granulation A mount: [1:Pink] [2:Pink] [3:Red] Granulation Quality: [1:None Present (0%)] [2:Medium (34-66%)] [3:Small (1-33%)] Necrotic A mount: [1:Fat Layer (Subcutaneous Tissue): Yes Fat Layer (Subcutaneous Tissue): Yes Fat Layer (Subcutaneous Tissue): Yes] Exposed Structures: [1:Fascia: No Tendon: No Muscle: No Joint: No Bone: No  Medium (34-66%)] [2:Fascia: No Tendon: No Muscle: No Joint: No Bone: No Medium (34-66%)] [3:Fascia: No Tendon: No Muscle: No Joint: No Bone: No None] Wound Number: 4 N/A N/A Photos: No Photos N/A N/A Right T Fourth oe N/A N/A Wound Location: Blister N/A N/A Wounding Event: Diabetic Wound/Ulcer of the Lower N/A N/A Primary Etiology: Extremity Asthma, Sleep Apnea, Peripheral N/A N/A Comorbid History: Venous Disease, Type I Diabetes, Neuropathy 11/27/2020 N/A N/A Date Acquired: 4 N/A N/A Weeks of Treatment: Open N/A N/A Wound Status: 0.2x0.2x0.1 N/A N/A Measurements L x W x D (cm) 0.031 N/A N/A A (cm) : rea 0.003 N/A N/A Volume (cm) : 99.20% N/A N/A % Reduction in A rea: 99.60% N/A N/A % Reduction in Volume: Grade 2 N/A N/A Classification: Medium N/A N/A Exudate A mount: Serosanguineous N/A N/A Exudate Type: red, brown N/A N/A Exudate Color: Flat and Intact N/A N/A Wound Margin: Large (67-100%) N/A N/A Granulation A mount: Red, Hyper-granulation N/A N/A Granulation Quality: None Present (0%) N/A N/A Necrotic A mount: Fat Layer (Subcutaneous Tissue): Yes N/A N/A Exposed Structures: Bone: Yes Fascia: No Tendon: No Muscle: No Joint: No None N/A N/A Epithelialization: Treatment Notes Wound #1 (Foot) Wound Laterality: Right, Lateral Cleanser Soap and Water Discharge Instruction: May shower and wash wound with dial antibacterial soap and water prior to dressing change. Peri-Wound Care Topical Primary Dressing Hydrofera Blue Ready Foam, 2.5 x2.5 in Discharge Instruction: Apply to wound bed as instructed Secondary Dressing Woven Gauze Sponge, Non-Sterile 4x4 in Discharge Instruction: Apply over primary dressing as directed. Secured With [employer], 4.5x3.1 (in/yd) Discharge Instruction: Secure with Kerlix as directed. Paper Tape, 2x10 (in/yd) Discharge Instruction: Secure dressing with tape as directed. Compression Wrap Compression  Stockings Add-Ons Wound #2 (Toe Second) Wound Laterality: Right Cleanser Soap and Water Discharge Instruction: May shower and wash wound with dial antibacterial soap and water prior to dressing change. Peri-Wound Care Topical Primary Dressing Maxorb Extra Calcium Alginate 2x2 in Discharge Instruction: Apply calcium alginate to wound bed as instructed Secondary Dressing Woven Gauze Sponges 2x2 in Discharge Instruction: Apply over primary dressing as directed. Secured With Conforming Stretch Gauze Bandage, Sterile 2x75 (in/in) Discharge Instruction: Secure with stretch gauze as directed. Paper Tape, 1x10 (in/yd) Discharge Instruction: Secure dressing with tape as directed. Compression Wrap Compression Stockings Add-Ons Wound #3 (Toe Third) Wound Laterality: Right Cleanser Soap and Water Discharge Instruction: May shower and wash wound with dial antibacterial soap and water prior to dressing change. Peri-Wound Care Topical Primary Dressing Maxorb Extra Calcium Alginate 2x2 in Discharge Instruction: Apply calcium alginate  to wound bed as instructed Secondary Dressing Woven Gauze Sponges 2x2 in Discharge Instruction: Apply over primary dressing as directed. Secured With Conforming Stretch Gauze Bandage, Sterile 2x75 (in/in) Discharge Instruction: Secure with stretch gauze as directed. Paper Tape, 1x10 (in/yd) Discharge Instruction: Secure dressing with tape as directed. Compression Wrap Compression Stockings Add-Ons Wound #4 (Toe Fourth) Wound Laterality: Right Cleanser Soap and Water Discharge Instruction: May shower and wash wound with dial antibacterial soap and water prior to dressing change. Peri-Wound Care Topical Primary Dressing Maxorb Extra Calcium Alginate 2x2 in Discharge Instruction: Apply calcium alginate to wound bed as instructed Secondary Dressing Woven Gauze Sponges 2x2 in Discharge Instruction: Apply over primary dressing as directed. Secured  With Conforming Stretch Gauze Bandage, Sterile 2x75 (in/in) Discharge Instruction: Secure with stretch gauze as directed. Paper Tape, 1x10 (in/yd) Discharge Instruction: Secure dressing with tape as directed. Compression Wrap Compression Stockings Add-Ons Electronic Signature(s) Signed: 12/29/2020 4:50:22 PM By: Linton Ham MD Signed: 12/29/2020 5:05:12 PM By: [name] Hurst [name], BSN Entered By: Linton Ham on 12/29/2020 08:55:46 -------------------------------------------------------------------------------- Multi-Disciplinary Care Plan Details Patient Name: Date of Service: [place], [place]. 12/29/2020 8:00 A M Medical Record Number: [id number] Patient Account Number: [id number] Date of Birth/Sex: Treating [name]: [birth year] (60 y.o. [name] Primary Care Gunnard Dorrance: [name] Other Clinician: Referring Daisy Lites: Treating Toyna Erisman/Extender: [name] in Treatment: 8 Active Inactive Wound/Skin Impairment Nursing Diagnoses: Impaired tissue integrity Knowledge deficit related to ulceration/compromised skin integrity Goals: Patient/caregiver will verbalize understanding of skin care regimen Date Initiated: 11/03/2020 Target Resolution Date: 01/30/2021 Goal Status: Active Ulcer/skin breakdown will have a volume reduction of 30% by week 4 Date Initiated: 11/03/2020 Date Inactivated: 12/01/2020 Target Resolution Date: 12/05/2020 Goal Status: Met Interventions: Assess patient/caregiver ability to obtain necessary supplies Assess patient/caregiver ability to perform ulcer/skin care regimen upon admission and as needed Assess ulceration(s) every visit Provide education on ulcer and skin care Notes: Electronic Signature(s) Signed: 12/29/2020 5:05:12 PM By: [name] Hurst [name], BSN Entered By: [name] on 12/29/2020 08:22:17 -------------------------------------------------------------------------------- Pain Assessment Details Patient  Name: Date of Service: [name] Shy. 12/29/2020 8:00 A M Medical Record Number: [id number] Patient Account Number: [id number] Date of Birth/Sex: Treating [name]: January 14, 1961 (60 y.o. [name], [name] Primary Care Perry Molla: [name] Other Clinician: Referring Ainsley Deakins: Treating Andrell Bergeson/Extender: [name] in Treatment: 8 Active Problems Location of Pain Severity and Description of Pain Patient Has Paino No Site Locations Rate the pain. Rate the pain. Current Pain Level: 0 Pain Management and Medication Current Pain Management: Electronic Signature(s) Signed: 01/06/2021 1:48:37 PM By: [name] Entered By: [name] on 12/29/2020 08:01:01 -------------------------------------------------------------------------------- Patient/Caregiver Education Details Patient Name: Date of Service: [place] C. 1/31/2022andnbsp8:00 A M Medical Record Number: [id number] Patient Account Number: [id number] Date of Birth/Gender: Treating [name]: [birth year] (60 y.o. [name] Primary Care Physician: [name] Other Clinician: Referring Physician: Treating Physician/Extender: [name] in Treatment: 8 Education Assessment Education Provided To: Patient Education Topics Provided Wound/Skin Impairment: Methods: Explain/Verbal Responses: State content correctly Electronic Signature(s) Signed: 12/29/2020 5:05:12 PM By: [name] Hurst [name], BSN Entered By: [name] on 12/29/2020 07:59:25 -------------------------------------------------------------------------------- Wound Assessment Details Patient Name: Date of Service: [name] Shy. 12/29/2020 8:00 A M Medical Record Number: [id number] Patient Account Number: [id number] Date of Birth/Sex: Treating [name]: June 17, 1961 (60 y.o. [name], [name] Primary Care Henli Hey: [name] Other Clinician: Referring Aadyn Buchheit: Treating  Finnley Larusso/Extender: [name] in Treatment: 8 Wound Status Wound  Number: 1 Primary Diabetic Wound/Ulcer of the Lower Extremity Etiology: Wound Location: Right, Lateral Foot Wound Open Wounding Event: Trauma Status: Date Acquired: 06/03/2020 Comorbid Asthma, Sleep Apnea, Peripheral Venous Disease, Type I Weeks Of Treatment: 8 History: Diabetes, Neuropathy Clustered Wound: No Photos Photo Uploaded By: Mikeal Hawthorne on 12/31/2020 14:32:04 Wound Measurements Length: (cm) 0.2 Width: (cm) 0.2 Depth: (cm) 0.4 Area: (cm) 0.031 Volume: (cm) 0.013 % Reduction in Area: 98.1% % Reduction in Volume: 91.9% Epithelialization: Medium (34-66%) Tunneling: No Undermining: No Wound Description Classification: Grade 2 Wound Margin: Distinct, outline attached Exudate Amount: Medium Exudate Type: Serous Exudate Color: amber Foul Odor After Cleansing: No Slough/Fibrino Yes Wound Bed Granulation Amount: Large (67-100%) Exposed Structure Granulation Quality: Pink Fascia Exposed: No Necrotic Amount: None Present (0%) Fat Layer (Subcutaneous Tissue) Exposed: Yes Tendon Exposed: No Muscle Exposed: No Joint Exposed: No Bone Exposed: No Treatment Notes Wound #1 (Foot) Wound Laterality: Right, Lateral Cleanser Soap and Water Discharge Instruction: May shower and wash wound with dial antibacterial soap and water prior to dressing change. Peri-Wound Care Topical Primary Dressing Hydrofera Blue Ready Foam, 2.5 x2.5 in Discharge Instruction: Apply to wound bed as instructed Secondary Dressing Woven Gauze Sponge, Non-Sterile 4x4 in Discharge Instruction: Apply over primary dressing as directed. Secured With [employer], 4.5x3.1 (in/yd) Discharge Instruction: Secure with Kerlix as directed. Paper Tape, 2x10 (in/yd) Discharge Instruction: Secure dressing with tape as directed. Compression Wrap Compression Stockings Add-Ons Electronic  Signature(s) Signed: 12/29/2020 5:05:12 PM By: [name] Hurst [name], BSN Signed: 01/06/2021 1:48:37 PM By: [name] Entered By: [name] on 12/29/2020 08:44:06 -------------------------------------------------------------------------------- Wound Assessment Details Patient Name: Date of Service: [name] Luria C. 12/29/2020 8:00 A M Medical Record Number: [id number] Patient Account Number: [id number] Date of Birth/Sex: Treating [name]: 1961/10/10 (60 y.o. [name], [name] Primary Care Khristian Seals: [name] Other Clinician: Referring Truett Mcfarlan: Treating Jaslyne Beeck/Extender: [name] in Treatment: 8 Wound Status Wound Number: 2 Primary Diabetic Wound/Ulcer of the Lower Extremity Etiology: Wound Location: Right T Second oe Wound Open Wounding Event: Blister Status: Date Acquired: 11/27/2020 Comorbid Asthma, Sleep Apnea, Peripheral Venous Disease, Type I Weeks Of Treatment: 4 History: Diabetes, Neuropathy Clustered Wound: No Photos Photo Uploaded By: Mikeal Hawthorne on 12/31/2020 14:32:16 Wound Measurements Length: (cm) 0.2 Width: (cm) 0.2 Depth: (cm) 0.1 Area: (cm) 0.031 Volume: (cm) 0.003 % Reduction in Area: 84.2% % Reduction in Volume: 85% Epithelialization: Medium (34-66%) Tunneling: No Undermining: No Wound Description Classification: Grade 1 Wound Margin: Flat and Intact Exudate Amount: Medium Exudate Type: Serosanguineous Exudate Color: red, brown Foul Odor After Cleansing: No Slough/Fibrino Yes Wound Bed Granulation Amount: Medium (34-66%) Exposed Structure Granulation Quality: Pink Fascia Exposed: No Necrotic Amount: Medium (34-66%) Fat Layer (Subcutaneous Tissue) Exposed: Yes Necrotic Quality: Adherent Slough Tendon Exposed: No Muscle Exposed: No Joint Exposed: No Bone Exposed: No Treatment Notes Wound #2 (Toe Second) Wound Laterality: Right Cleanser Soap and Water Discharge Instruction: May shower  and wash wound with dial antibacterial soap and water prior to dressing change. Peri-Wound Care Topical Primary Dressing Maxorb Extra Calcium Alginate 2x2 in Discharge Instruction: Apply calcium alginate to wound bed as instructed Secondary Dressing Woven Gauze Sponges 2x2 in Discharge Instruction: Apply over primary dressing as directed. Secured With Conforming Stretch Gauze Bandage, Sterile 2x75 (in/in) Discharge Instruction: Secure with stretch gauze as directed. Paper Tape, 1x10 (in/yd) Discharge Instruction: Secure dressing with tape as directed. Compression Wrap Compression Stockings Add-Ons Electronic Signature(s) Signed: 12/29/2020 5:05:12 PM By: [name] Hurst [name], BSN Signed: 01/06/2021 1:48:37  PM By: [name] Entered By: [name] on 12/29/2020 08:31:01 -------------------------------------------------------------------------------- Wound Assessment Details Patient Name: Date of Service: [place], [place] C. 12/29/2020 8:00 A M Medical Record Number: [id number] Patient Account Number: [id number] Date of Birth/Sex: Treating [name]: Mar 30, 1961 (60 y.o. [name], [name] Primary Care Ticara Waner: [name] Other Clinician: Referring Hildreth Robart: Treating Chelsey Redondo/Extender: [name] in Treatment: 8 Wound Status Wound Number: 3 Primary Diabetic Wound/Ulcer of the Lower Extremity Etiology: Wound Location: Right T Third oe Wound Open Wounding Event: Blister Status: Date Acquired: 11/27/2020 Comorbid Asthma, Sleep Apnea, Peripheral Venous Disease, Type I Weeks Of Treatment: 4 History: Diabetes, Neuropathy Clustered Wound: No Photos Photo Uploaded By: Mikeal Hawthorne on 12/31/2020 14:32:27 Wound Measurements Length: (cm) 0.2 Width: (cm) 0.2 Depth: (cm) 0.1 Area: (cm) 0.031 Volume: (cm) 0.003 % Reduction in Area: 99.6% % Reduction in Volume: 99.6% Epithelialization: None Tunneling: No Undermining: No Wound  Description Classification: Grade 1 Wound Margin: Flat and Intact Exudate Amount: Medium Exudate Type: Serosanguineous Exudate Color: red, brown Foul Odor After Cleansing: No Slough/Fibrino Yes Wound Bed Granulation Amount: Large (67-100%) Exposed Structure Granulation Quality: Red Fascia Exposed: No Necrotic Amount: Small (1-33%) Fat Layer (Subcutaneous Tissue) Exposed: Yes Necrotic Quality: Adherent Slough Tendon Exposed: No Muscle Exposed: No Joint Exposed: No Bone Exposed: No Treatment Notes Wound #3 (Toe Third) Wound Laterality: Right Cleanser Soap and Water Discharge Instruction: May shower and wash wound with dial antibacterial soap and water prior to dressing change. Peri-Wound Care Topical Primary Dressing Maxorb Extra Calcium Alginate 2x2 in Discharge Instruction: Apply calcium alginate to wound bed as instructed Secondary Dressing Woven Gauze Sponges 2x2 in Discharge Instruction: Apply over primary dressing as directed. Secured With Conforming Stretch Gauze Bandage, Sterile 2x75 (in/in) Discharge Instruction: Secure with stretch gauze as directed. Paper Tape, 1x10 (in/yd) Discharge Instruction: Secure dressing with tape as directed. Compression Wrap Compression Stockings Add-Ons Electronic Signature(s) Signed: 01/06/2021 1:48:37 PM By: [name] Entered By: [name] on 12/29/2020 08:16:46 -------------------------------------------------------------------------------- Wound Assessment Details Patient Name: Date of Service: [name] Luria C. 12/29/2020 8:00 A M Medical Record Number: [id number] Patient Account Number: [id number] Date of Birth/Sex: Treating [name]: October 12, 1961 (60 y.o. [name], [name] Primary Care Joncarlo Friberg: [name] Other Clinician: Referring Jensen Kilburg: Treating Sadeen Wiegel/Extender: [name] in Treatment: 8 Wound Status Wound Number: 4 Primary Diabetic Wound/Ulcer of the Lower  Extremity Etiology: Wound Location: Right T Fourth oe Wound Open Wounding Event: Blister Status: Date Acquired: 11/27/2020 Comorbid Asthma, Sleep Apnea, Peripheral Venous Disease, Type I Weeks Of Treatment: 4 History: Diabetes, Neuropathy Clustered Wound: No Photos Photo Uploaded By: Mikeal Hawthorne on 12/31/2020 14:32:40 Wound Measurements Length: (cm) 0.2 Width: (cm) 0.2 Depth: (cm) 0.1 Area: (cm) 0.031 Volume: (cm) 0.003 % Reduction in Area: 99.2% % Reduction in Volume: 99.6% Epithelialization: None Tunneling: No Undermining: No Wound Description Classification: Grade 2 Wound Margin: Flat and Intact Exudate Amount: Medium Exudate Type: Serosanguineous Exudate Color: red, brown Foul Odor After Cleansing: No Slough/Fibrino Yes Wound Bed Granulation Amount: Large (67-100%) Exposed Structure Granulation Quality: Red, Hyper-granulation Fascia Exposed: No Necrotic Amount: None Present (0%) Fat Layer (Subcutaneous Tissue) Exposed: Yes Tendon Exposed: No Muscle Exposed: No Joint Exposed: No Bone Exposed: Yes Treatment Notes Wound #4 (Toe Fourth) Wound Laterality: Right Cleanser Soap and Water Discharge Instruction: May shower and wash wound with dial antibacterial soap and water prior to dressing change. Peri-Wound Care Topical Primary Dressing Maxorb Extra Calcium Alginate 2x2 in Discharge Instruction: Apply calcium alginate to wound bed  as instructed Secondary Dressing Woven Gauze Sponges 2x2 in Discharge Instruction: Apply over primary dressing as directed. Secured With Conforming Stretch Gauze Bandage, Sterile 2x75 (in/in) Discharge Instruction: Secure with stretch gauze as directed. Paper Tape, 1x10 (in/yd) Discharge Instruction: Secure dressing with tape as directed. Compression Wrap Compression Stockings Add-Ons Electronic Signature(s) Signed: 01/06/2021 1:48:37 PM By: [name] Entered By: [name] on 12/29/2020  08:17:05 -------------------------------------------------------------------------------- Vitals Details Patient Name: Date of Service: Graylon Good, Jerrell Belfast C. 12/29/2020 8:00 A M Medical Record Number: [id number] Patient Account Number: [id number] Date of Birth/Sex: Treating [name]: [birth year] (60 y.o. [name], [name] Primary Care Aubrynn Katona: [name] Other Clinician: Referring Olaoluwa Grieder: Treating Kim Lauver/Extender: [name] in Treatment: 8 Vital Signs Time Taken: 07:59 Temperature (F): 97.7 Height (in): 64 Pulse (bpm): 70 Weight (lbs): 165 Respiratory Rate (breaths/min): 17 Body Mass Index (BMI): 28.3 Blood Pressure (mmHg): 124/83 Reference Range: 80 - 120 mg / dl Electronic Signature(s) Signed: 01/06/2021 1:48:37 PM By: [name] Entered By: [name] on 12/29/2020 08:00:52

## 2021-01-12 ENCOUNTER — Encounter (HOSPITAL_BASED_OUTPATIENT_CLINIC_OR_DEPARTMENT_OTHER): Payer: Managed Care, Other (non HMO) | Attending: Internal Medicine | Admitting: Internal Medicine

## 2021-01-12 ENCOUNTER — Other Ambulatory Visit: Payer: Self-pay

## 2021-01-12 DIAGNOSIS — E10621 Type 1 diabetes mellitus with foot ulcer: Secondary | ICD-10-CM | POA: Diagnosis not present

## 2021-01-12 DIAGNOSIS — L03115 Cellulitis of right lower limb: Secondary | ICD-10-CM | POA: Diagnosis not present

## 2021-01-12 DIAGNOSIS — M14671 Charcot's joint, right ankle and foot: Secondary | ICD-10-CM | POA: Insufficient documentation

## 2021-01-12 DIAGNOSIS — Z794 Long term (current) use of insulin: Secondary | ICD-10-CM | POA: Insufficient documentation

## 2021-01-12 DIAGNOSIS — E1042 Type 1 diabetes mellitus with diabetic polyneuropathy: Secondary | ICD-10-CM | POA: Diagnosis not present

## 2021-01-12 DIAGNOSIS — L97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 severity: Secondary | ICD-10-CM | POA: Insufficient documentation

## 2021-01-12 NOTE — Progress Notes (Signed)
[name], [name] ([id number]) Visit Report for 01/12/2021 HPI Details Patient Name: Date of Service: [place], [name] 01/12/2021 8:00 A M Medical Record Number: [id number] Patient Account Number: [id number] Date of Birth/Sex: Treating RN: Mar 27, 1961 (60 y.o. [name] Primary Care Provider: Juluis Rainier Other Clinician: Referring Provider: Treating Provider/Extender: Arelia Longest in Treatment: 10 History of Present Illness HPI Description: ADMISSION 11/03/2020 this is a 60 year old woman with type 2 diabetes. She has severe peripheral neuropathy and autonomic neuropathy. 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 Her problem started at the beginning of July her husband noted a bleeding area in her sock. Unfortunately this became infected requiring admission to hospital from 7/12 through 06/13/2020. An MRI was negative for osteo she received IV antibiotics and was discharged on doxycycline. She did have standard arterial ABIs and TBI's but I did not see any waveforms. Fortunately the ABIs and TBI's were normal at 1.09 and 0.69 on the right and 1.01 and 1.02 on the left respectively. Her last visit with Dr. Allena Katz of podiatry was on 07/04/2020. At 1 which time the measurements were 1.1 x 0.8 x 0.3 she was using a dry dressing. She went on to [facility] at which time the wound was debrided and they were prescribed Santyl. The wound is actually on the lateral foot over the base of the fifth metatarsal. She had an additional wound on her right heel but this closed over. They are currently soaking the foot in Epson salts and using Santyl and they have been doing this for quite a period of time. Past medical history includes type 1 diabetes managed with an insulin pump, movement disorder, hypothyroidism, obstructive sleep apnea. She has a cam boot and she wears this religiously  spending most of her day currently in bed but she needs the boot on to get up to use her bedside commode ABIs were done during the hospitalization in July that showed the numbers quoted above 12/13; right lateral foot which is in the setting of an inverted ankle and likely to be this patient's full weightbearing surface. There is some eschar and slough around the wound circumference but in general the wound surface does not look too bad. She has new diabetic shoes and a brace for when this heals hopefully this will maintain skin integrity. Other than that I think she would need an attempt at surgery if a procedure is available to try and correct this weightbearing to form 12/20; right lateral foot in the setting of a severely inverted ankle. The patient is minimally ambulatory at this point. The areas over the right fifth metatarsal base. 12/01/2020; her right lateral foot wound looks a lot better however she comes in today with full de-epithelialized over the right second third and fourth toes with intense erythema and the erythema is spreading into the dorsal foot. Her husband is able to show me pictures which seems to start around Christmas day. She is insensate and therefore does not feel pain. She has not had any systemic symptoms including fever chills. Her CBGs have been in the usual range for her. She has an allergy to penicillin based on a childhood issue but they tell me she has had ampicillin or amoxicillin since then and tolerated it well other than the fact that most antibiotics seem to cause her nausea 1/10; right lateral foot continues to improve. The area of  cellulitis on the right dorsal foot and the erythema on the second third and fourth toes caused me to put her on empiric cephalexin last time. There was nothing to culture. T oday she comes in with erythema receded from the dorsal foot but still markedly erythematous second third and fourth toes. She has wounds especially between  the third and fourth but also an area between the second and third. She also has what looks to be a paronychia a developing on the right medial first toe 1/18; her original wound on the right lateral foot looks about the same somewhat dry. While we are taking care of this she developed intense cellulitis involving the second third and fourth toes spreading into the forefoot. She had 10 days of cephalexin and things have generally improved quite a bit and the second and third toes dorsal foot but not the fourth toe. She has a substantial wound on the fourth toe medially with now exposed bone. We have been using silver alginate here. Hydrofera Blue to the lateral foot 1/25; her original wound on the right lateral foot at the fifth metatarsal base continues to contract this looks healthy. The subsequent wounds on her right fourth third and second toes continue to be problematic. The BONE SCRAPING PCR on the right fourth toe medial aspect last week showed MSSA and she is now on Bactrim I may continue this for at least a month. We are using silver alginate on the toes Hydrofera Blue on the right lateral foot 1/31 continues on Bactrim. The area on the right lateral foot at the base of the fifth metatarsal is smaller still with some depth. But as long as this is improving in terms of surface area no debridement. Using [facility] on this area. All of her wounds on her toes look less extensive calcium alginate as the primary dressing here 2/14; the area on the right lateral foot continues to be mostly closed over. Still some dry flaking skin and eschar. The only other wound I can identify is on the medial part of the right second toe there is some erythema here but I do not think there is infection we have been using silver alginate Her husband tells me he took the [facility] off because he thought things were too moist. Electronic Signature(s) Signed: 01/12/2021 5:51:45 PM By: Baltazar Najjar  MD Entered By: Baltazar Najjar on 01/12/2021 08:33:16 -------------------------------------------------------------------------------- Physical Exam Details Patient Name: Date of Service: [name]. 01/12/2021 8:00 A M Medical Record Number: [id number] Patient Account Number: [id number] Date of Birth/Sex: Treating RN: Mar 16, 1961 (60 y.o. [name] Primary Care Provider: Juluis Rainier Other Clinician: Referring Provider: Treating Provider/Extender: Arelia Longest in Treatment: 10 Notes Wound exam; right lateral foot at the base of the fifth metatarsal. This is still closed but looks somewhat vulnerable. I removed flaking skin but there is no open wound here. The only other wound I can see is on the medial aspect of the second toe everything else looks closed. He is using silver alginate on the toes and keeping them separate Electronic Signature(s) Signed: 01/12/2021 5:51:45 PM By: Baltazar Najjar MD Entered By: Baltazar Najjar on 01/12/2021 08:34:14 -------------------------------------------------------------------------------- Physician Orders Details Patient Name: Date of Service: [name] Rudd C. 01/12/2021 8:00 A M Medical Record Number: [id number] Patient Account Number: [id number] Date of Birth/Sex: Treating RN: Apr 03, 1961 (60 y.o. [name] Primary Care Provider: Juluis Rainier Other Clinician: Referring Provider: Treating Provider/Extender: Arelia Longest  in Treatment: 10 Verbal / Phone Orders: No Diagnosis Coding ICD-10 Coding Code Description E10.621 Type 1 diabetes mellitus with foot ulcer L97.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 severity E10.42 Type 1 diabetes mellitus with diabetic polyneuropathy L03.115 Cellulitis of right lower limb M14.671 Charcot's joint, right ankle and foot Follow-up Appointments Return Appointment in 2 weeks. Bathing/  Shower/ Hygiene May shower and wash wound with soap and water. - on days that dressing is changed Off-Loading Removable cast walker boot to: - right foot Other: - Minimal weight bearing on right foot Wound Treatment Wound #1 - Foot Wound Laterality: Right, Lateral Cleanser: Soap and Water Every Other Day/15 Days Discharge Instructions: May shower and wash wound with dial antibacterial soap and water prior to dressing change. Prim Dressing: PolyMem Non-Adhesive Dressing, 4x4 in Every Other Day/15 Days ary Discharge Instructions: Apply to wound bed as instructed Secondary Dressing: Woven Gauze Sponge, Non-Sterile 4x4 in (Generic) Every Other Day/15 Days Discharge Instructions: Apply over primary dressing as directed. Secured With: [employer] Roll Sterile, 4.5x3.1 (in/yd) (Generic) Every Other Day/15 Days Discharge Instructions: Secure with Kerlix as directed. Secured With: Paper Tape, 2x10 (in/yd) (Generic) Every Other Day/15 Days Discharge Instructions: Secure dressing with tape as directed. Wound #2 - T Second oe Wound Laterality: Right Cleanser: Soap and Water Every Other Day/15 Days Discharge Instructions: May shower and wash wound with dial antibacterial soap and water prior to dressing change. Prim Dressing: Maxorb Extra Calcium Alginate 2x2 in Every Other Day/15 Days ary Discharge Instructions: Apply calcium alginate to wound bed as instructed Secondary Dressing: Woven Gauze Sponges 2x2 in Every Other Day/15 Days Discharge Instructions: Apply over primary dressing as directed. Secured With: Insurance underwriterConforming Stretch Gauze Bandage, Sterile 2x75 (in/in) Every Other Day/15 Days Discharge Instructions: Secure with stretch gauze as directed. Secured With: Paper Tape, 1x10 (in/yd) Every Other Day/15 Days Discharge Instructions: Secure dressing with tape as directed. Electronic Signature(s) Signed: 01/12/2021 5:50:50 PM By: Zandra AbtsLynch, [name] RN, BSN Signed: 01/12/2021 5:51:45 PM By: Baltazar Najjarobson, Atzel Mccambridge  MD Entered By: Zandra AbtsLynch, [name] on 01/12/2021 08:48:37 -------------------------------------------------------------------------------- Problem List Details Patient Name: Date of Service: [name] RuddSNYDER, [name] 01/12/2021 8:00 A M Medical Record Number: [id number] Patient Account Number: [id number] Date of Birth/Sex: Treating RN: 08/12/1961 (60 y.o. Wynelle LinkF) [name], [name] Primary Care Provider: Juluis RainierBarnes, Elizabeth Other Clinician: Referring Provider: Treating Provider/Extender: Arelia Longestobson, Kairee Isa Barnes, Elizabeth Weeks in Treatment: 10 Active Problems ICD-10 Encounter Code Description Active Date MDM Diagnosis E10.621 Type 1 diabetes mellitus with foot ulcer 11/03/2020 No Yes L97.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 11/03/2020 No Yes severity E10.42 Type 1 diabetes mellitus with diabetic polyneuropathy 11/03/2020 No Yes L03.115 Cellulitis of right lower limb 12/01/2020 No Yes M14.671 Charcot's joint, right ankle and foot 11/03/2020 No Yes Inactive Problems Resolved Problems Electronic Signature(s) Signed: 01/12/2021 5:51:45 PM By: Baltazar Najjarobson, Langford Carias MD Entered By: Baltazar Najjarobson, Markevious Ehmke on 01/12/2021 08:32:04 -------------------------------------------------------------------------------- Progress Note Details Patient Name: Date of Service: [name] RuddSNYDER, [name] 01/12/2021 8:00 A M Medical Record Number: [id number] Patient Account Number: [id number] Date of Birth/Sex: Treating RN: 06/30/1961 (60 y.o. Wynelle LinkF) [name], [name] Primary Care Provider: Juluis RainierBarnes, Elizabeth Other Clinician: Referring Provider: Treating Provider/Extender: Arelia Longestobson, Talullah Abate Barnes, Elizabeth Weeks in Treatment: 10 Subjective History of Present Illness (HPI) ADMISSION 11/03/2020 this is a 60 year old woman with type 2 diabetes. She has severe peripheral neuropathy and autonomic neuropathy. 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 Her problem  started at the  beginning of July her husband noted a bleeding area in her sock. Unfortunately this became infected requiring admission to hospital from 7/12 through 06/13/2020. An MRI was negative for osteo she received IV antibiotics and was discharged on doxycycline. She did have standard arterial ABIs and TBI's but I did not see any waveforms. Fortunately the ABIs and TBI's were normal at 1.09 and 0.69 on the right and 1.01 and 1.02 on the left respectively. Her last visit with Dr. Allena Katz of podiatry was on 07/04/2020. At 1 which time the measurements were 1.1 x 0.8 x 0.3 she was using a dry dressing. She went on to [facility] at which time the wound was debrided and they were prescribed Santyl. The wound is actually on the lateral foot over the base of the fifth metatarsal. She had an additional wound on her right heel but this closed over. They are currently soaking the foot in Epson salts and using Santyl and they have been doing this for quite a period of time. Past medical history includes type 1 diabetes managed with an insulin pump, movement disorder, hypothyroidism, obstructive sleep apnea. She has a cam boot and she wears this religiously spending most of her day currently in bed but she needs the boot on to get up to use her bedside commode ABIs were done during the hospitalization in July that showed the numbers quoted above 12/13; right lateral foot which is in the setting of an inverted ankle and likely to be this patient's full weightbearing surface. There is some eschar and slough around the wound circumference but in general the wound surface does not look too bad. She has new diabetic shoes and a brace for when this heals hopefully this will maintain skin integrity. Other than that I think she would need an attempt at surgery if a procedure is available to try and correct this weightbearing to form 12/20; right lateral foot in the setting of a severely inverted  ankle. The patient is minimally ambulatory at this point. The areas over the right fifth metatarsal base. 12/01/2020; her right lateral foot wound looks a lot better however she comes in today with full de-epithelialized over the right second third and fourth toes with intense erythema and the erythema is spreading into the dorsal foot. Her husband is able to show me pictures which seems to start around Christmas day. She is insensate and therefore does not feel pain. She has not had any systemic symptoms including fever chills. Her CBGs have been in the usual range for her. She has an allergy to penicillin based on a childhood issue but they tell me she has had ampicillin or amoxicillin since then and tolerated it well other than the fact that most antibiotics seem to cause her nausea 1/10; right lateral foot continues to improve. ooThe area of cellulitis on the right dorsal foot and the erythema on the second third and fourth toes caused me to put her on empiric cephalexin last time. There was nothing to culture. T oday she comes in with erythema receded from the dorsal foot but still markedly erythematous second third and fourth toes. She has wounds especially between the third and fourth but also an area between the second and third. She also has what looks to be a paronychia a developing on the right medial first toe 1/18; her original wound on the right lateral foot looks about the same somewhat dry. While we  are taking care of this she developed intense cellulitis involving the second third and fourth toes spreading into the forefoot. She had 10 days of cephalexin and things have generally improved quite a bit and the second and third toes dorsal foot but not the fourth toe. She has a substantial wound on the fourth toe medially with now exposed bone. We have been using silver alginate here. Hydrofera Blue to the lateral foot 1/25; her original wound on the right lateral foot at the fifth  metatarsal base continues to contract this looks healthy. The subsequent wounds on her right fourth third and second toes continue to be problematic. The BONE SCRAPING PCR on the right fourth toe medial aspect last week showed MSSA and she is now on Bactrim I may continue this for at least a month. We are using silver alginate on the toes Hydrofera Blue on the right lateral foot 1/31 continues on Bactrim. The area on the right lateral foot at the base of the fifth metatarsal is smaller still with some depth. But as long as this is improving in terms of surface area no debridement. Using [facility] on this area. All of her wounds on her toes look less extensive calcium alginate as the primary dressing here 2/14; the area on the right lateral foot continues to be mostly closed over. Still some dry flaking skin and eschar. The only other wound I can identify is on the medial part of the right second toe there is some erythema here but I do not think there is infection we have been using silver alginate Her husband tells me he took the [facility] off because he thought things were too moist. Assessment Active Problems ICD-10 Type 1 diabetes mellitus with foot ulcer Non-pressure chronic ulcer of other part of right foot with other specified severity Type 1 diabetes mellitus with diabetic polyneuropathy Cellulitis of right lower limb Charcot's joint, right ankle and foot Plan 1. I did not change the dressings between the toes which is silver alginate. I have asked him to call me if the erythema on the second toe looks as though it is expanding 2. The area on the right lateral foot which is her original wound is fully epithelialized. I think the tissue however is still vulnerable were going to use polymen on this area I do not see the benefit of Hydrofera Blue at this point Electronic Signature(s) Signed: 01/12/2021 5:51:45 PM By: Baltazar Najjar MD Entered By: Baltazar Najjar on 01/12/2021  08:35:04 -------------------------------------------------------------------------------- SuperBill Details Patient Name: Date of Service: [name] 01/12/2021 Medical Record Number: [id number] Patient Account Number: [id number] Date of Birth/Sex: Treating RN: 04-Mar-1961 (60 y.o. [name] Primary Care Provider: Juluis Rainier Other Clinician: Referring Provider: Treating Provider/Extender: Arelia Longest in Treatment: 10 Diagnosis Coding ICD-10 Codes Code Description [phone] Type 1 diabetes mellitus with foot ulcer L97.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 severity E10.42 Type 1 diabetes mellitus with diabetic polyneuropathy L03.115 Cellulitis of right lower limb M14.671 Charcot's joint, right ankle and foot Facility Procedures CPT4 Code: [id number] Description: 9214 - WOUND CARE VISIT-LEV 4 EST PT Modifier: Quantity: 1 Physician Procedures : CPT4 Code Description Modifier [id number] - WC PHYS LEVEL 3 - EST PT ICD-10 Diagnosis Description E10.621 Type 1 diabetes mellitus with foot ulcer L97.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 severity E10.42  Type 1 diabetes mellitus with diabetic polyneuropathy Quantity: 1  Electronic Signature(s) Signed: 01/12/2021 5:50:50 PM By: Zandra Abts RN, BSN Signed: 01/12/2021 5:51:45 PM By: Baltazar Najjar MD Entered By: Zandra Abts on 01/12/2021 08:50:43

## 2021-01-12 NOTE — Progress Notes (Signed)
[name], [name] ([id number]) Visit Report for 01/12/2021 Arrival Information Details Patient Name: Date of Service: [place], [name] 01/12/2021 8:00 A M Medical Record Number: [id number] Patient Account Number: [id number] Date of Birth/Sex: Treating RN: Oct 29, 1961 (60 y.o. Helene Shoe, Tammi Klippel Primary Care Lavella Myren: [name] Other Clinician: Referring Jabarie Pop: Treating Aljean Horiuchi/Extender: [name] in Treatment: 10 Visit Information History Since Last Visit Added or deleted any medications: No Patient Arrived: Wheel Chair Any new allergies or adverse reactions: No Arrival Time: 07:57 Had a fall or experienced change in No Accompanied By: husband activities of daily living that may affect Transfer Assistance: None risk of falls: Patient Identification Verified: Yes Signs or symptoms of abuse/neglect since last No Secondary Verification Process Completed: Yes visito Patient Requires Transmission-Based Precautions: No Hospitalized since last visit: No Patient Has Alerts: Yes Implantable device outside of the clinic No Patient Alerts: R ABI 1.09 TBI .69 excluding L ABI 1.02 TBI .77 cellular tissue based products placed in the center since last visit: Has Dressing in Place as Prescribed: Yes Has Footwear/Offloading in Place as Yes Prescribed: Right: Removable Cast Walker/Walking Boot Pain Present Now: No Electronic Signature(s) Signed: 01/12/2021 5:25:06 PM By: Deon Pilling Entered By: Deon Pilling on 01/12/2021 08:44:54 -------------------------------------------------------------------------------- Clinic Level of Care Assessment Details Patient Name: Date of Service: [name], [name] 01/12/2021 8:00 A M Medical Record Number: [id number] Patient Account Number: [id number] Date of Birth/Sex: Treating RN: 1961-06-05 (60 y.o. [name] Primary Care Jentry Mcqueary: [name] Other Clinician: Referring Humphrey Guerreiro: Treating  Kynslei Art/Extender: [name] in Treatment: 10 Clinic Level of Care Assessment Items TOOL 4 Quantity Score X- 1 0 Use when only an EandM is performed on FOLLOW-UP visit ASSESSMENTS - Nursing Assessment / Reassessment X- 1 10 Reassessment of Co-morbidities (includes updates in patient status) X- 1 5 Reassessment of Adherence to Treatment Plan ASSESSMENTS - Wound and Skin A ssessment / Reassessment []  - 0 Simple Wound Assessment / Reassessment - one wound X- 4 5 Complex Wound Assessment / Reassessment - multiple wounds []  - 0 Dermatologic / Skin Assessment (not related to wound area) ASSESSMENTS - Focused Assessment []  - 0 Circumferential Edema Measurements - multi extremities []  - 0 Nutritional Assessment / Counseling / Intervention X- 1 5 Lower Extremity Assessment (monofilament, tuning fork, pulses) []  - 0 Peripheral Arterial Disease Assessment (using hand held doppler) ASSESSMENTS - Ostomy and/or Continence Assessment and Care []  - 0 Incontinence Assessment and Management []  - 0 Ostomy Care Assessment and Management (repouching, etc.) PROCESS - Coordination of Care X - Simple Patient / Family Education for ongoing care 1 15 []  - 0 Complex (extensive) Patient / Family Education for ongoing care X- 1 10 Staff obtains Programmer, systems, Records, T Results / Process Orders est []  - 0 Staff telephones HHA, Nursing Homes / Clarify orders / etc []  - 0 Routine Transfer to another Facility (non-emergent condition) []  - 0 Routine Hospital Admission (non-emergent condition) []  - 0 New Admissions / Biomedical engineer / Ordering NPWT Apligraf, etc. , []  - 0 Emergency Hospital Admission (emergent condition) X- 1 10 Simple Discharge Coordination []  - 0 Complex (extensive) Discharge Coordination PROCESS - Special Needs []  - 0 Pediatric / Minor Patient Management []  - 0 Isolation Patient Management []  - 0 Hearing / Language / Visual special  needs []  - 0 Assessment of Community assistance (transportation, D/C planning, etc.) []  - 0 Additional assistance / Altered mentation []  - 0 Support Surface(s) Assessment (bed, cushion, seat, etc.) INTERVENTIONS -  Wound Cleansing / Measurement []  - 0 Simple Wound Cleansing - one wound X- 4 5 Complex Wound Cleansing - multiple wounds X- 1 5 Wound Imaging (photographs - any number of wounds) []  - 0 Wound Tracing (instead of photographs) []  - 0 Simple Wound Measurement - one wound X- 4 5 Complex Wound Measurement - multiple wounds INTERVENTIONS - Wound Dressings []  - 0 Small Wound Dressing one or multiple wounds X- 1 15 Medium Wound Dressing one or multiple wounds []  - 0 Large Wound Dressing one or multiple wounds X- 1 5 Application of Medications - topical []  - 0 Application of Medications - injection INTERVENTIONS - Miscellaneous []  - 0 External ear exam []  - 0 Specimen Collection (cultures, biopsies, blood, body fluids, etc.) []  - 0 Specimen(s) / Culture(s) sent or taken to Lab for analysis []  - 0 Patient Transfer (multiple staff / Civil Service fast streamer / Similar devices) []  - 0 Simple Staple / Suture removal (25 or less) []  - 0 Complex Staple / Suture removal (26 or more) []  - 0 Hypo / Hyperglycemic Management (close monitor of Blood Glucose) []  - 0 Ankle / Brachial Index (ABI) - do not check if billed separately X- 1 5 Vital Signs Has the patient been seen at the hospital within the last three years: Yes Total Score: 145 Level Of Care: New/Established - Level 4 Electronic Signature(s) Signed: 01/12/2021 5:50:50 PM By: Levan Hurst RN, BSN Entered By: Levan Hurst on 01/12/2021 08:50:36 -------------------------------------------------------------------------------- Encounter Discharge Information Details Patient Name: Date of Service: [name]. 01/12/2021 8:00 A M Medical Record Number: [id number] Patient Account Number: [id number] Date of Birth/Sex:  Treating RN: 01-29-1961 (60 y.o. Helene Shoe, Tammi Klippel Primary Care Maygan Koeller: [name] Other Clinician: Referring Marielys Trinidad: Treating Margrett Kalb/Extender: [name] in Treatment: 10 Encounter Discharge Information Items Discharge Condition: Stable Ambulatory Status: Wheelchair Discharge Destination: Home Transportation: Private Auto Accompanied By: husband Schedule Follow-up Appointment: Yes Clinical Summary of Care: Electronic Signature(s) Signed: 01/12/2021 5:25:06 PM By: Deon Pilling Entered By: Deon Pilling on 01/12/2021 08:56:07 -------------------------------------------------------------------------------- Lower Extremity Assessment Details Patient Name: Date of Service: TYREA, FROBERG 01/12/2021 8:00 A M Medical Record Number: [id number] Patient Account Number: [id number] Date of Birth/Sex: Treating RN: 07/04/1961 (60 y.o. [name] Primary Care Selma Mink: [name] Other Clinician: Referring Quashon Jesus: Treating Addilynn Mowrer/Extender: [name] in Treatment: 10 Edema Assessment Assessed: Shirlyn Goltz: No] Patrice Paradise: Yes] Edema: [Left: N] [Right: o] Calf Left: Right: Point of Measurement: 28 cm From Medial Instep 29.5 cm Ankle Left: Right: Point of Measurement: 10 cm From Medial Instep 18.2 cm Electronic Signature(s) Signed: 01/12/2021 5:25:06 PM By: Deon Pilling Entered By: Deon Pilling on 01/12/2021 08:45:39 -------------------------------------------------------------------------------- [place] Details Patient Name: Date of Service: [name]. 01/12/2021 8:00 A M Medical Record Number: [id number] Patient Account Number: [id number] Date of Birth/Sex: Treating RN: [birth year] (60 y.o. [name] Primary Care Billee Balcerzak: [name] Other Clinician: Referring Tramar Brueckner: Treating Georgann Bramble/Extender: [name] in Treatment: 10 Active  Inactive Wound/Skin Impairment Nursing Diagnoses: Impaired tissue integrity Knowledge deficit related to ulceration/compromised skin integrity Goals: Patient/caregiver will verbalize understanding of skin care regimen Date Initiated: 11/03/2020 Target Resolution Date: 01/30/2021 Goal Status: Active Ulcer/skin breakdown will have a volume reduction of 30% by week 4 Date Initiated: 11/03/2020 Date Inactivated: 12/01/2020 Target Resolution Date: 12/05/2020 Goal Status: Met Interventions: Assess patient/caregiver ability to obtain necessary supplies Assess patient/caregiver ability to perform ulcer/skin care regimen upon admission and as needed Assess  ulceration(s) every visit Provide education on ulcer and skin care Notes: Electronic Signature(s) Signed: 01/12/2021 5:50:50 PM By: Levan Hurst RN, BSN Entered By: Levan Hurst on 01/12/2021 08:49:29 -------------------------------------------------------------------------------- Pain Assessment Details Patient Name: Date of Service: Crista Luria C. 01/12/2021 8:00 A M Medical Record Number: [id number] Patient Account Number: [id number] Date of Birth/Sex: Treating RN: 05-Jan-1961 (60 y.o. [name] Primary Care Merly Hinkson: [name] Other Clinician: Referring Ileta Ofarrell: Treating Guthrie Lemme/Extender: [name] in Treatment: 10 Active Problems Location of Pain Severity and Description of Pain Patient Has Paino No Site Locations Rate the pain. Current Pain Level: 0 Pain Management and Medication Current Pain Management: Medication: No Cold Application: No Rest: No Massage: No Activity: No T.E.N.S.: No Heat Application: No Leg drop or elevation: No Is the Current Pain Management Adequate: Adequate How does your wound impact your activities of daily livingo Sleep: No Bathing: No Appetite: No Relationship With Others: No Bladder Continence: No Emotions: No Bowel Continence: No Work:  No Toileting: No Drive: No Dressing: No Hobbies: No Electronic Signature(s) Signed: 01/12/2021 5:25:06 PM By: Deon Pilling Entered By: Deon Pilling on 01/12/2021 08:45:22 -------------------------------------------------------------------------------- Patient/Caregiver Education Details Patient Name: Date of Service: [place] C. 2/14/2022andnbsp8:00 A M Medical Record Number: [id number] Patient Account Number: [id number] Date of Birth/Gender: Treating RN: 1961-09-26 (60 y.o. [name] Primary Care Physician: [name] Other Clinician: Referring Physician: Treating Physician/Extender: [name] in Treatment: 10 Education Assessment Education Provided To: Patient Education Topics Provided Wound/Skin Impairment: Methods: Explain/Verbal Responses: State content correctly [employer]) Signed: 01/12/2021 5:50:50 PM By: Levan Hurst RN, BSN Entered By: Levan Hurst on 01/12/2021 08:50:01 -------------------------------------------------------------------------------- Wound Assessment Details Patient Name: Date of Service: [name] 01/12/2021 8:00 A M Medical Record Number: [id number] Patient Account Number: [id number] Date of Birth/Sex: Treating RN: 12-03-1960 (60 y.o. Helene Shoe, Meta.Reding Primary Care Audric Venn: [name] Other Clinician: Referring Emory Gallentine: Treating Jeany Seville/Extender: [name] in Treatment: 10 Wound Status Wound Number: 1 Primary Diabetic Wound/Ulcer of the Lower Extremity Etiology: Wound Location: Right, Lateral Foot Wound Open Wounding Event: Trauma Status: Date Acquired: 06/03/2020 Comorbid Asthma, Sleep Apnea, Peripheral Venous Disease, Type I Weeks Of Treatment: 10 History: Diabetes, Neuropathy Clustered Wound: No Wound Measurements Length: (cm) 0.1 Width: (cm) 0.1 Depth: (cm) 0.1 Area: (cm) 0.008 Volume: (cm) 0.001 % Reduction in  Area: 99.5% % Reduction in Volume: 99.4% Epithelialization: Large (67-100%) Tunneling: No Undermining: No Wound Description Classification: Grade 2 Wound Margin: Distinct, outline attached Exudate Amount: Small Exudate Type: Serosanguineous Exudate Color: red, brown Foul Odor After Cleansing: No Slough/Fibrino Yes Wound Bed Granulation Amount: Large (67-100%) Exposed Structure Granulation Quality: Pink Fascia Exposed: No Necrotic Amount: None Present (0%) Fat Layer (Subcutaneous Tissue) Exposed: Yes Tendon Exposed: No Muscle Exposed: No Joint Exposed: No Bone Exposed: No Assessment Notes callous present to periwound. Treatment Notes Wound #1 (Foot) Wound Laterality: Right, Lateral Cleanser Soap and Water Discharge Instruction: May shower and wash wound with dial antibacterial soap and water prior to dressing change. Peri-Wound Care Topical Primary Dressing PolyMem Non-Adhesive Dressing, 4x4 in Discharge Instruction: Apply to wound bed as instructed Secondary Dressing Woven Gauze Sponge, Non-Sterile 4x4 in Discharge Instruction: Apply over primary dressing as directed. Secured With [employer], 4.5x3.1 (in/yd) Discharge Instruction: Secure with Kerlix as directed. Paper Tape, 2x10 (in/yd) Discharge Instruction: Secure dressing with tape as directed. Compression Wrap Compression Stockings Add-Ons Electronic Signature(s) Signed: 01/12/2021 5:25:06 PM By: Deon Pilling Entered By: Deon Pilling  on 01/12/2021 08:46:47 -------------------------------------------------------------------------------- Wound Assessment Details Patient Name: Date of Service: [place], [name] 01/12/2021 8:00 A M Medical Record Number: [id number] Patient Account Number: [id number] Date of Birth/Sex: Treating RN: 1961-08-30 (60 y.o. Helene Shoe, Meta.Reding Primary Care Zannah Melucci: [name] Other Clinician: Referring Chelcie Estorga: Treating Beth Spackman/Extender: [name] in Treatment: 10 Wound Status Wound Number: 2 Primary Diabetic Wound/Ulcer of the Lower Extremity Etiology: Wound Location: Right T Second oe Wound Open Wounding Event: Blister Status: Date Acquired: 11/27/2020 Comorbid Asthma, Sleep Apnea, Peripheral Venous Disease, Type I Weeks Of Treatment: 6 History: Diabetes, Neuropathy Clustered Wound: No Wound Measurements Length: (cm) 0.2 Width: (cm) 0.2 Depth: (cm) 0.1 Area: (cm) 0.031 Volume: (cm) 0.003 % Reduction in Area: 84.2% % Reduction in Volume: 85% Epithelialization: Large (67-100%) Tunneling: No Undermining: No Wound Description Classification: Grade 1 Wound Margin: Flat and Intact Exudate Amount: Medium Exudate Type: Serosanguineous Exudate Color: red, brown Foul Odor After Cleansing: No Slough/Fibrino Yes Wound Bed Granulation Amount: Large (67-100%) Exposed Structure Granulation Quality: Pink Fascia Exposed: No Necrotic Amount: Small (1-33%) Fat Layer (Subcutaneous Tissue) Exposed: Yes Necrotic Quality: Adherent Slough Tendon Exposed: No Muscle Exposed: No Joint Exposed: No Bone Exposed: No Treatment Notes Wound #2 (Toe Second) Wound Laterality: Right Cleanser Soap and Water Discharge Instruction: May shower and wash wound with dial antibacterial soap and water prior to dressing change. Peri-Wound Care Topical Primary Dressing Maxorb Extra Calcium Alginate 2x2 in Discharge Instruction: Apply calcium alginate to wound bed as instructed Secondary Dressing Woven Gauze Sponges 2x2 in Discharge Instruction: Apply over primary dressing as directed. Secured With Conforming Stretch Gauze Bandage, Sterile 2x75 (in/in) Discharge Instruction: Secure with stretch gauze as directed. Paper Tape, 1x10 (in/yd) Discharge Instruction: Secure dressing with tape as directed. Compression Wrap Compression Stockings Add-Ons Electronic Signature(s) Signed: 01/12/2021 5:25:06 PM By: Deon Pilling Entered By: Deon Pilling on 01/12/2021 08:46:16 -------------------------------------------------------------------------------- Wound Assessment Details Patient Name: Date of Service: KIMYAH, FREIN 01/12/2021 8:00 A M Medical Record Number: [id number] Patient Account Number: [id number] Date of Birth/Sex: Treating RN: 11-12-1961 (60 y.o. Helene Shoe, Meta.Reding Primary Care Mckayla Mulcahey: [name] Other Clinician: Referring Ronneisha Jett: Treating Casy Brunetto/Extender: [name] in Treatment: 10 Wound Status Wound Number: 3 Primary Etiology: Diabetic Wound/Ulcer of the Lower Extremity Wound Location: Right T Third oe Wound Status: Healed - Epithelialized Wounding Event: Blister Date Acquired: 11/27/2020 Weeks Of Treatment: 6 Clustered Wound: No Wound Measurements Length: (cm) Width: (cm) Depth: (cm) Area: (cm) Volume: (cm) 0 % Reduction in Area: 100% 0 % Reduction in Volume: 100% 0 0 0 Wound Description Classification: Grade 1 Treatment Notes Wound #3 (Toe Third) Wound Laterality: Right Cleanser Peri-Wound Care Topical Primary Dressing Secondary Dressing Secured With Compression Wrap Compression Stockings Add-Ons Electronic Signature(s) Signed: 01/12/2021 5:25:06 PM By: Deon Pilling Entered By: Deon Pilling on 01/12/2021 08:45:57 -------------------------------------------------------------------------------- Wound Assessment Details Patient Name: Date of Service: Crista Luria C. 01/12/2021 8:00 A M Medical Record Number: [id number] Patient Account Number: [id number] Date of Birth/Sex: Treating RN: 1961/08/04 (61 y.o. [name] Primary Care Adlene Adduci: [name] Other Clinician: Referring Alvina Strother: Treating Gerald Kuehl/Extender: [name] in Treatment: 10 Wound Status Wound Number: 4 Primary Etiology: Diabetic Wound/Ulcer of the Lower Extremity Wound Location: Right T Fourth oe  Wound Status: Healed - Epithelialized Wounding Event: Blister Date Acquired: 11/27/2020 Weeks Of Treatment: 6 Clustered Wound: No Wound Measurements Length: (cm) Width: (cm) Depth: (cm) Area: (cm) Volume: (cm) 0 % Reduction in Area: 100% 0 %  Reduction in Volume: 100% 0 0 0 Wound Description Classification: Grade 2 Treatment Notes Wound #4 (Toe Fourth) Wound Laterality: Right Cleanser Peri-Wound Care Topical Primary Dressing Secondary Dressing Secured With Compression Wrap Compression Stockings Add-Ons Electronic Signature(s) Signed: 01/12/2021 5:25:06 PM By: Deon Pilling Entered By: Deon Pilling on 01/12/2021 08:45:57 -------------------------------------------------------------------------------- Vitals Details Patient Name: Date of Service: Graylon Good, [place]. 01/12/2021 8:00 A M Medical Record Number: [id number] Patient Account Number: [id number] Date of Birth/Sex: Treating RN: 1961/10/01 (60 y.o. Helene Shoe, Meta.Reding Primary Care Jeilani Grupe: [name] Other Clinician: Referring Takara Sermons: Treating Liahna Brickner/Extender: [name] in Treatment: 10 Vital Signs Time Taken: 07:58 Temperature (F): 98.2 Height (in): 64 Pulse (bpm): 69 Weight (lbs): 165 Respiratory Rate (breaths/min): 17 Body Mass Index (BMI): 28.3 Blood Pressure (mmHg): 129/76 Reference Range: 80 - 120 mg / dl Electronic Signature(s) Signed: 01/12/2021 5:25:06 PM By: Deon Pilling Entered By: Deon Pilling on 01/12/2021 08:45:11

## 2021-01-20 ENCOUNTER — Encounter (INDEPENDENT_AMBULATORY_CARE_PROVIDER_SITE_OTHER): Payer: Managed Care, Other (non HMO) | Admitting: Ophthalmology

## 2021-01-26 ENCOUNTER — Encounter (HOSPITAL_BASED_OUTPATIENT_CLINIC_OR_DEPARTMENT_OTHER): Payer: Managed Care, Other (non HMO) | Admitting: Internal Medicine

## 2021-01-26 ENCOUNTER — Other Ambulatory Visit: Payer: Self-pay

## 2021-01-26 DIAGNOSIS — E10621 Type 1 diabetes mellitus with foot ulcer: Secondary | ICD-10-CM | POA: Diagnosis not present

## 2021-01-26 NOTE — Progress Notes (Signed)
BASSHEVA, FLURY ([id number]) Visit Report for 01/26/2021 HPI Details Patient Name: Date of Service: [name], [name] 01/26/2021 8:00 A M Medical Record Number: [id number] Patient Account Number: [id number] Date of Birth/Sex: Treating RN: [birth year] (60 y.o. [name] Primary Care Provider: Juluis Rainier Other Clinician: Referring Provider: Treating Provider/Extender: Arelia Longest in Treatment: 12 History of Present Illness HPI Description: ADMISSION 11/03/2020 this is a 60 year old woman with type 2 diabetes. She has severe peripheral neuropathy and autonomic neuropathy. 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 Her problem started at the beginning of July her husband noted a bleeding area in her sock. Unfortunately this became infected requiring admission to hospital from 7/12 through 06/13/2020. An MRI was negative for osteo she received IV antibiotics and was discharged on doxycycline. She did have standard arterial ABIs and TBI's but I did not see any waveforms. Fortunately the ABIs and TBI's were normal at 1.09 and 0.69 on the right and 1.01 and 1.02 on the left respectively. Her last visit with Dr. Allena Katz of podiatry was on 07/04/2020. At 1 which time the measurements were 1.1 x 0.8 x 0.3 she was using a dry dressing. She went on to see [facility] Grandville [facility] at which time the wound was debrided and they were prescribed Santyl. The wound is actually on the lateral foot over the base of the fifth metatarsal. She had an additional wound on her right heel but this closed over. They are currently soaking the foot in Epson salts and using Santyl and they have been doing this for quite a period of time. Past medical history includes type 1 diabetes managed with an insulin pump, movement disorder, hypothyroidism, obstructive sleep apnea. She has a cam boot and she wears this religiously  spending most of her day currently in bed but she needs the boot on to get up to use her bedside commode ABIs were done during the hospitalization in July that showed the numbers quoted above 12/13; right lateral foot which is in the setting of an inverted ankle and likely to be this patient's full weightbearing surface. There is some eschar and slough around the wound circumference but in general the wound surface does not look too bad. She has new diabetic shoes and a brace for when this heals hopefully this will maintain skin integrity. Other than that I think she would need an attempt at surgery if a procedure is available to try and correct this weightbearing to form 12/20; right lateral foot in the setting of a severely inverted ankle. The patient is minimally ambulatory at this point. The areas over the right fifth metatarsal base. 12/01/2020; her right lateral foot wound looks a lot better however she comes in today with full de-epithelialized over the right second third and fourth toes with intense erythema and the erythema is spreading into the dorsal foot. Her husband is able to show me pictures which seems to start around Christmas day. She is insensate and therefore does not feel pain. She has not had any systemic symptoms including fever chills. Her CBGs have been in the usual range for her. She has an allergy to penicillin based on a childhood issue but they tell me she has had ampicillin or amoxicillin since then and tolerated it well other than the fact that most antibiotics seem to cause her nausea 1/10; right lateral foot continues to improve. The area of  cellulitis on the right dorsal foot and the erythema on the second third and fourth toes caused me to put her on empiric cephalexin last time. There was nothing to culture. T oday she comes in with erythema receded from the dorsal foot but still markedly erythematous second third and fourth toes. She has wounds especially between  the third and fourth but also an area between the second and third. She also has what looks to be a paronychia a developing on the right medial first toe 1/18; her original wound on the right lateral foot looks about the same somewhat dry. While we are taking care of this she developed intense cellulitis involving the second third and fourth toes spreading into the forefoot. She had 10 days of cephalexin and things have generally improved quite a bit and the second and third toes dorsal foot but not the fourth toe. She has a substantial wound on the fourth toe medially with now exposed bone. We have been using silver alginate here. Hydrofera Blue to the lateral foot 1/25; her original wound on the right lateral foot at the fifth metatarsal base continues to contract this looks healthy. The subsequent wounds on her right fourth third and second toes continue to be problematic. The BONE SCRAPING PCR on the right fourth toe medial aspect last week showed MSSA and she is now on Bactrim I may continue this for at least a month. We are using silver alginate on the toes Hydrofera Blue on the right lateral foot 1/31 continues on Bactrim. The area on the right lateral foot at the base of the fifth metatarsal is smaller still with some depth. But as long as this is improving in terms of surface area no debridement. Using [facility] on this area. All of her wounds on her toes look less extensive calcium alginate as the primary dressing here 2/14; the area on the right lateral foot continues to be mostly closed over. Still some dry flaking skin and eschar. The only other wound I can identify is on the medial part of the right second toe there is some erythema here but I do not think there is infection we have been using silver alginate Her husband tells me he took the [facility] off because he thought things were too moist. 2/28; the patient's wound is closed on the right lateral foot at roughly the base  of the fifth metatarsal. Also the area on the right second toe is closed. She has a severe ankle inversion history of Charcot foot. They tried to put her AFO brace on her over the weekend and it really did not work looking at her foot I am not surprised Electronic Signature(s) Signed: 01/26/2021 6:21:54 PM By: Baltazar Najjar MD Entered By: Baltazar Najjar on 01/26/2021 09:05:47 -------------------------------------------------------------------------------- Physical Exam Details Patient Name: Date of Service: [name]. 01/26/2021 8:00 A M Medical Record Number: [id number] Patient Account Number: [id number] Date of Birth/Sex: Treating RN: [birth year] (61 y.o. [name] Primary Care Provider: Juluis Rainier Other Clinician: Referring Provider: Treating Provider/Extender: Arelia Longest in Treatment: 12 Cardiovascular Pedal pulses palpable. Notes Wound exam Right lateral foot at the base of the fifth metatarsal. This is closed the area on the right second toe is also epithelialized Electronic Signature(s) Signed: 01/26/2021 6:21:54 PM By: Baltazar Najjar MD Entered By: Baltazar Najjar on 01/26/2021 09:06:25 -------------------------------------------------------------------------------- Physician Orders Details Patient Name: Date of Service: [name]. 01/26/2021 8:00 A M Medical Record Number: [id number]  Patient Account Number: [id number] Date of Birth/Sex: Treating RN: 1961-09-09 (60 y.o. [name] Primary Care Provider: Juluis Rainier Other Clinician: Referring Provider: Treating Provider/Extender: Arelia Longest in Treatment: 12 Verbal / Phone Orders: No Diagnosis Coding ICD-10 Coding Code Description E10.621 Type 1 diabetes mellitus with foot ulcer L97.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 severity E10.42 Type 1 diabetes mellitus with diabetic  polyneuropathy L03.115 Cellulitis of right lower limb M14.671 Charcot's joint, right ankle and foot Discharge From [name] Discharge from [facility] Off-Loading Removable cast walker boot to: - right foot Other: - Minimal weight bearing on right foot - keep side of foot padded with foam for protection Electronic Signature(s) Signed: 01/26/2021 5:56:44 PM By: Zandra Abts RN, BSN Signed: 01/26/2021 6:21:54 PM By: Baltazar Najjar MD Entered By: Zandra Abts on 01/26/2021 08:19:46 -------------------------------------------------------------------------------- Problem List Details Patient Name: Date of Service: [name]. 01/26/2021 8:00 A M Medical Record Number: [id number] Patient Account Number: [id number] Date of Birth/Sex: Treating RN: 11-26-1961 (60 y.o. [name] Primary Care Provider: Juluis Rainier Other Clinician: Referring Provider: Treating Provider/Extender: Arelia Longest in Treatment: 12 Active Problems ICD-10 Encounter Code Description Active Date MDM Diagnosis E10.621 Type 1 diabetes mellitus with foot ulcer 11/03/2020 No Yes L97.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 11/03/2020 No Yes severity E10.42 Type 1 diabetes mellitus with diabetic polyneuropathy 11/03/2020 No Yes L03.115 Cellulitis of right lower limb 12/01/2020 No Yes M14.671 Charcot's joint, right ankle and foot 11/03/2020 No Yes Inactive Problems Resolved Problems Electronic Signature(s) Signed: 01/26/2021 6:21:54 PM By: Baltazar Najjar MD Entered By: Baltazar Najjar on 01/26/2021 09:04:26 -------------------------------------------------------------------------------- Progress Note Details Patient Name: Date of Service: [name]. 01/26/2021 8:00 A M Medical Record Number: [id number] Patient Account Number: [id number] Date of Birth/Sex: Treating RN: November 21, 1961 (60 y.o. [name] Primary Care  Provider: Juluis Rainier Other Clinician: Referring Provider: Treating Provider/Extender: Arelia Longest in Treatment: 12 Subjective History of Present Illness (HPI) ADMISSION 11/03/2020 this is a 60 year old woman with type 2 diabetes. She has severe peripheral neuropathy and autonomic neuropathy. 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 Her problem started at the beginning of July her husband noted a bleeding area in her sock. Unfortunately this became infected requiring admission to hospital from 7/12 through 06/13/2020. An MRI was negative for osteo she received IV antibiotics and was discharged on doxycycline. She did have standard arterial ABIs and TBI's but I did not see any waveforms. Fortunately the ABIs and TBI's were normal at 1.09 and 0.69 on the right and 1.01 and 1.02 on the left respectively. Her last visit with Dr. Allena Katz of podiatry was on 07/04/2020. At 1 which time the measurements were 1.1 x 0.8 x 0.3 she was using a dry dressing. She went on to see [facility] at which time the wound was debrided and they were prescribed Santyl. The wound is actually on the lateral foot over the base of the fifth metatarsal. She had an additional wound on her right heel but this closed over. They are currently soaking the foot in Epson salts and using Santyl and they have been doing this for quite a period of time. Past medical history includes type 1 diabetes managed with an insulin pump, movement disorder, hypothyroidism, obstructive sleep apnea. She has  a cam boot and she wears this religiously spending most of her day currently in bed but she needs the boot on to get up to use her bedside commode ABIs were done during the hospitalization in July that showed the numbers quoted above 12/13; right lateral foot which is in the setting of an inverted ankle and likely to be this  patient's full weightbearing surface. There is some eschar and slough around the wound circumference but in general the wound surface does not look too bad. She has new diabetic shoes and a brace for when this heals hopefully this will maintain skin integrity. Other than that I think she would need an attempt at surgery if a procedure is available to try and correct this weightbearing to form 12/20; right lateral foot in the setting of a severely inverted ankle. The patient is minimally ambulatory at this point. The areas over the right fifth metatarsal base. 12/01/2020; her right lateral foot wound looks a lot better however she comes in today with full de-epithelialized over the right second third and fourth toes with intense erythema and the erythema is spreading into the dorsal foot. Her husband is able to show me pictures which seems to start around Christmas day. She is insensate and therefore does not feel pain. She has not had any systemic symptoms including fever chills. Her CBGs have been in the usual range for her. She has an allergy to penicillin based on a childhood issue but they tell me she has had ampicillin or amoxicillin since then and tolerated it well other than the fact that most antibiotics seem to cause her nausea 1/10; right lateral foot continues to improve. ooThe area of cellulitis on the right dorsal foot and the erythema on the second third and fourth toes caused me to put her on empiric cephalexin last time. There was nothing to culture. T oday she comes in with erythema receded from the dorsal foot but still markedly erythematous second third and fourth toes. She has wounds especially between the third and fourth but also an area between the second and third. She also has what looks to be a paronychia a developing on the right medial first toe 1/18; her original wound on the right lateral foot looks about the same somewhat dry. While we are taking care of this she  developed intense cellulitis involving the second third and fourth toes spreading into the forefoot. She had 10 days of cephalexin and things have generally improved quite a bit and the second and third toes dorsal foot but not the fourth toe. She has a substantial wound on the fourth toe medially with now exposed bone. We have been using silver alginate here. Hydrofera Blue to the lateral foot 1/25; her original wound on the right lateral foot at the fifth metatarsal base continues to contract this looks healthy. The subsequent wounds on her right fourth third and second toes continue to be problematic. The BONE SCRAPING PCR on the right fourth toe medial aspect last week showed MSSA and she is now on Bactrim I may continue this for at least a month. We are using silver alginate on the toes Hydrofera Blue on the right lateral foot 1/31 continues on Bactrim. The area on the right lateral foot at the base of the fifth metatarsal is smaller still with some depth. But as long as this is improving in terms of surface area no debridement. Using [facility] on this area. All of her wounds on her toes  look less extensive calcium alginate as the primary dressing here 2/14; the area on the right lateral foot continues to be mostly closed over. Still some dry flaking skin and eschar. The only other wound I can identify is on the medial part of the right second toe there is some erythema here but I do not think there is infection we have been using silver alginate Her [facility] thought things were too moist. 2/28; the patient's wound is closed on the right lateral foot at roughly the base of the fifth metatarsal. Also the area on the right second toe is closed. She has a severe ankle inversion history of Charcot foot. They tried to put her AFO brace on her over the weekend and it really did not work looking at her foot I am not  surprised Objective Constitutional Vitals Time Taken: 8:00 AM, Height: 64 in, Weight: 165 lbs, BMI: 28.3, Temperature: 97.5 F, Pulse: 66 bpm, Respiratory Rate: 17 breaths/min, Blood Pressure: 126/80 mmHg, Capillary Blood Glucose: 145 mg/dl. Cardiovascular Pedal pulses palpable. General Notes: Wound exam ooRight lateral foot at the base of the fifth metatarsal. This is closed the area on the right second toe is also epithelialized Integumentary (Hair, Skin) Wound #1 status is Healed - Epithelialized. Original cause of wound was Trauma. The date acquired was: 06/03/2020. The wound has been in treatment 12 weeks. The wound is located on the Right,Lateral Foot. The wound measures 0cm length x 0cm width x 0cm depth; 0cm^2 area and 0cm^3 volume. There is no tunneling or undermining noted. There is a none present amount of drainage noted. The wound margin is distinct with the outline attached to the wound base. There is no granulation within the wound bed. There is no necrotic tissue within the wound bed. Wound #2 status is Healed - Epithelialized. Original cause of wound was Blister. The date acquired was: 11/27/2020. The wound has been in treatment 8 weeks. The wound is located on the Right T Second. The wound measures 0cm length x 0cm width x 0cm depth; 0cm^2 area and 0cm^3 volume. There is no oe tunneling or undermining noted. There is a none present amount of drainage noted. The wound margin is flat and intact. There is no granulation within the wound bed. There is no necrotic tissue within the wound bed. Assessment Active Problems ICD-10 Type 1 diabetes mellitus with foot ulcer Non-pressure chronic ulcer of other part of right foot with other specified severity Type 1 diabetes mellitus with diabetic polyneuropathy Cellulitis of right lower limb Charcot's joint, right ankle and foot [facility] Off-Loading: Removable cast walker boot  to: - right foot Other: - Minimal weight bearing on right foot - keep side of foot padded with foam for protection 1. I have advised continuing to pad the area of the right lateral foot 2. Also keeping her toes separated no matter what shoes she has on 3. I am not surprised that the AFO brace does not work. Her ankle is held in tight inversion. I am not exactly sure how long it has been present but is certainly gotten worse in the timeframe that she has been here 4. I went over options for her to think about including ankle corrective surgery if her bone would be strong enough to except plating and pinning/rods etc. also amputation as she might do better with a prosthesis in her overall mobility. Emphasizing that I  am not a Sports administrator) Signed: 01/26/2021 6:21:54 PM By: Baltazar Najjar MD Entered By: Baltazar Najjar on 01/26/2021 09:08:07 -------------------------------------------------------------------------------- SuperBill Details Patient Name: Date of Service: [name] 01/26/2021 Medical Record Number: [id number] Patient Account Number: [id number] Date of Birth/Sex: Treating RN: May 11, 1961 (60 y.o. [name] Primary Care Provider: Juluis Rainier Other Clinician: Referring Provider: Treating Provider/Extender: Arelia Longest in Treatment: 12 Diagnosis Coding ICD-10 Codes Code Description [phone] Type 1 diabetes mellitus with foot ulcer L97.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 severity E10.42 Type 1 diabetes mellitus with diabetic polyneuropathy L03.115 Cellulitis of right lower limb M14.671 Charcot's joint, right ankle and foot Facility Procedures CPT4 Code: [id number] Description: 99213 - WOUND CARE VISIT-LEV 3 EST PT Modifier: Quantity: 1 Physician Procedures : CPT4 Code Description Modifier [id number] - WC PHYS LEVEL 3 - EST PT ICD-10 Diagnosis Description L97.518  Non-pressure chronic ulcer of other part of right foot with other specified severity E10.621 Type 1 diabetes mellitus with foot ulcer E10.42  Type 1 diabetes mellitus with diabetic polyneuropathy M14.671 Charcot's joint, right ankle and foot Quantity: 1 Electronic Signature(s) Signed: 01/26/2021 6:21:54 PM By: Baltazar Najjar MD Entered By: Baltazar Najjar on 01/26/2021 09:08:27

## 2021-01-27 NOTE — Progress Notes (Signed)
[name], [name] C. ([id number]) Visit Report for 01/26/2021 Arrival Information Details [place] Name: [name] 01/26/2021 8:00 A M Medical Record Number: [id number] Patient Account Number: [id number] Date of Birth/Sex: Treating RN: 02/01/1961 ([age] y.o. [name] RowanF) Breedlove, [name] Primary Care Provider: Juluis RainierBarnes, Elizabeth Other Clinician: Referring Provider: Treating Provider/Extender: Arelia Longestobson, Michael Barnes, Elizabeth Weeks in Treatment: 12 Visit Information History Since Last Visit Added or deleted any medications: No Patient Arrived: Wheel Chair Any new allergies or adverse reactions: No Arrival Time: 07:59 Had a fall or experienced change in No Accompanied By: husband activities of daily living that may affect Transfer Assistance: None risk of falls: Patient Identification Verified: Yes Signs or symptoms of abuse/neglect since last visito No Secondary Verification Process Completed: Yes Hospitalized since last visit: No Patient Requires Transmission-Based Precautions: No Implantable device outside of the clinic excluding No Patient Has Alerts: Yes cellular tissue based products placed in the center Patient Alerts: R ABI 1.09 TBI .69 since last visit: L ABI 1.02 TBI .77 Has Dressing in Place as Prescribed: Yes Pain Present Now: No Electronic Signature(s) Signed: 01/27/2021 5:58:27 PM By: Fonnie MuBreedlove, [name] RN Entered By: Fonnie MuBreedlove, [name] on 01/26/2021 08:00:10 -------------------------------------------------------------------------------- Clinic Level of Care Assessment Details Patient Name: Date of Service: [name] 01/26/2021 8:00 A M Medical Record Number: [id number] Patient Account Number: [id number] Date of Birth/Sex: Treating RN: 03/28/1961 (60 y.o. [name] LinkF) [name], [name] Primary Care Provider: Juluis RainierBarnes, Elizabeth Other Clinician: Referring Provider: Treating Provider/Extender: Arelia Longestobson, Michael Barnes, Elizabeth Weeks in Treatment: 12 Clinic Level of Care  Assessment Items TOOL 4 Quantity Score X- 1 0 Use when only an EandM is performed on FOLLOW-UP visit ASSESSMENTS - Nursing Assessment / Reassessment X- 1 10 Reassessment of Co-morbidities (includes updates in patient status) X- 1 5 Reassessment of Adherence to Treatment Plan ASSESSMENTS - Wound and Skin A ssessment / Reassessment []  - 0 Simple Wound Assessment / Reassessment - one wound X- 2 5 Complex Wound Assessment / Reassessment - multiple wounds []  - 0 Dermatologic / Skin Assessment (not related to wound area) ASSESSMENTS - Focused Assessment []  - 0 Circumferential Edema Measurements - multi extremities []  - 0 Nutritional Assessment / Counseling / Intervention X- 1 5 Lower Extremity Assessment (monofilament, tuning fork, pulses) []  - 0 Peripheral Arterial Disease Assessment (using hand held doppler) ASSESSMENTS - Ostomy and/or Continence Assessment and Care []  - 0 Incontinence Assessment and Management []  - 0 Ostomy Care Assessment and Management (repouching, etc.) PROCESS - Coordination of Care X - Simple Patient / Family Education for ongoing care 1 15 []  - 0 Complex (extensive) Patient / Family Education for ongoing care X- 1 10 Staff obtains ChiropractorConsents, Records, T Results / Process Orders est []  - 0 Staff telephones HHA, Nursing Homes / Clarify orders / etc []  - 0 Routine Transfer to another Facility (non-emergent condition) []  - 0 Routine Hospital Admission (non-emergent condition) []  - 0 New Admissions / Manufacturing engineernsurance Authorizations / Ordering NPWT Apligraf, etc. , []  - 0 Emergency Hospital Admission (emergent condition) X- 1 10 Simple Discharge Coordination []  - 0 Complex (extensive) Discharge Coordination PROCESS - Special Needs []  - 0 Pediatric / Minor Patient Management []  - 0 Isolation Patient Management []  - 0 Hearing / Language / Visual special needs []  - 0 Assessment of Community assistance (transportation, D/C planning, etc.) []  -  0 Additional assistance / Altered mentation []  - 0 Support Surface(s) Assessment (bed, cushion, seat, etc.) INTERVENTIONS - Wound Cleansing / Measurement []  - 0 Simple Wound Cleansing -  one wound X- 2 5 Complex Wound Cleansing - multiple wounds X- 1 5 Wound Imaging (photographs - any number of wounds) []  - 0 Wound Tracing (instead of photographs) []  - 0 Simple Wound Measurement - one wound X- 2 5 Complex Wound Measurement - multiple wounds INTERVENTIONS - Wound Dressings []  - 0 Small Wound Dressing one or multiple wounds []  - 0 Medium Wound Dressing one or multiple wounds []  - 0 Large Wound Dressing one or multiple wounds []  - 0 Application of Medications - topical []  - 0 Application of Medications - injection INTERVENTIONS - Miscellaneous []  - 0 External ear exam []  - 0 Specimen Collection (cultures, biopsies, blood, body fluids, etc.) []  - 0 Specimen(s) / Culture(s) sent or taken to Lab for analysis []  - 0 Patient Transfer (multiple staff / / Similar devices) []  - 0 Simple Staple / Suture removal (25 or less) []  - 0 Complex Staple / Suture removal (26 or more) []  - 0 Hypo / Hyperglycemic Management (close monitor of Blood Glucose) []  - 0 Ankle / Brachial Index (ABI) - do not check if billed separately X- 1 5 Vital Signs Has the patient been seen at the hospital within the last three years: Yes Total Score: 95 Level Of Care: New/Established - Level 3 Electronic Signature(s) Signed: 01/26/2021 5:56:44 PM By: RN, BSN Entered By: on 01/26/2021 08:20:35 -------------------------------------------------------------------------------- Encounter Discharge Information Details Patient Name: Date of Service: . 01/26/2021 8:00 A M Medical Record Number: Patient Account Number: Date of Birth/Sex: Treating RN: 10-Jul-1961 (60 y.o. [name], [name] Primary Care Provider: Other  Clinician: Referring Provider: Treating Provider/Extender: in Treatment: 12 Encounter Discharge Information Items Discharge Condition: Stable Ambulatory Status: Wheelchair Discharge Destination: Home Transportation: Private Auto Accompanied By: husband Schedule Follow-up Appointment: Yes Clinical Summary of Care: Patient Declined Electronic Signature(s) Signed: 01/26/2021 5:56:44 PM By: RN, BSN Entered By: 01/28/2021 on 01/26/2021 08:32:46 -------------------------------------------------------------------------------- Lower Extremity Assessment Details Patient Name: Date of Service: [name] Abts. 01/26/2021 8:00 A M Medical Record Number: [name] Patient Account Number: 01/28/2021 Date of Birth/Sex: Treating RN: [birth year] (60 y.o. 06/02/1961, [name] Primary Care Provider: 41 Other Clinician: Referring Provider: Treating Provider/Extender: [name] in Treatment: 12 Edema Assessment Assessed: Juluis Rainier: No] Arelia Longest: Yes] Edema: [Left: N] [Right: o] Calf Left: Right: Point of Measurement: 28 cm From Medial Instep 29.5 cm Ankle Left: Right: Point of Measurement: 10 cm From Medial Instep 18.2 cm Vascular Assessment Pulses: Dorsalis Pedis Palpable: [Right:Yes] Posterior Tibial Palpable: [Right:Yes] Electronic Signature(s) Signed: 01/27/2021 5:58:27 PM By: [name] Abts RN Entered By: [name] Abts on 01/26/2021 08:02:24 -------------------------------------------------------------------------------- Multi Wound Chart Details Patient Name: Date of Service: [name]. 01/26/2021 8:00 A M Medical Record Number: [id number] Patient Account Number: [id number] Date of Birth/Sex: Treating RN: [birth year] (60 y.o. [name] Primary Care Provider: Juluis Rainier Other Clinician: Referring Provider: Treating Provider/Extender: Arelia Longest in Treatment: 12 Vital Signs Height(in): 64 Capillary Blood Glucose(mg/dl): Kyra Searles Weight(lbs): Franne Forts Pulse(bpm): 66 Body Mass Index(BMI): 28 Blood Pressure(mmHg): 126/80 Temperature(F): 97.5 Respiratory Rate(breaths/min): 17 Photos: [1:No Photos Right, Lateral Foot] [2:No Photos Right T Second oe] [N/A:N/A N/A] Wound Location: [1:Trauma] [2:Blister] [N/A:N/A] Wounding Event: [1:Diabetic Wound/Ulcer of the Lower] [2:Diabetic Wound/Ulcer of the Lower] [N/A:N/A] Primary Etiology: [1:Extremity Asthma, Sleep Apnea, Peripheral] [2:Extremity Asthma, Sleep Apnea, Peripheral] [N/A:N/A] Comorbid History: [1:Venous Disease, Type I Diabetes, Neuropathy  06/03/2020] [2:Venous Disease, Type I Diabetes, Neuropathy 11/27/2020] [N/A:N/A] Date Acquired: [1:12] [2:8] [N/A:N/A] Weeks of Treatment: [1:Healed - Epithelialized] [2:Healed - Epithelialized] [N/A:N/A] Wound Status: [1:0x0x0] [2:0x0x0] [N/A:N/A] Measurements L x W x D (cm) [1:0] [2:0] [N/A:N/A] A (cm) : rea [1:0] [2:0] [N/A:N/A] Volume (cm) : [1:100.00%] [2:100.00%] [N/A:N/A] % Reduction in A rea: [1:100.00%] [2:100.00%] [N/A:N/A] % Reduction in Volume: [1:Grade 2] [2:Grade 1] [N/A:N/A] Classification: [1:None Present] [2:None Present] [N/A:N/A] Exudate A mount: [1:Distinct, outline attached] [2:Flat and Intact] [N/A:N/A] Wound Margin: [1:None Present (0%)] [2:None Present (0%)] [N/A:N/A] Granulation A mount: [1:None Present (0%)] [2:None Present (0%)] [N/A:N/A] Necrotic A mount: [1:Fascia: No] [2:Fascia: No] [N/A:N/A] Exposed Structures: [1:Fat Layer (Subcutaneous Tissue): No Tendon: No Muscle: No Joint: No Bone: No Large (67-100%)] [2:Fat Layer (Subcutaneous Tissue): No Tendon: No Muscle: No Joint: No Bone: No Large (67-100%)] [N/A:N/A] Treatment Notes Electronic Signature(s) Signed: 01/26/2021 5:56:44 PM By: [name] Abts RN, BSN Signed: 01/26/2021 6:21:54 PM By: Baltazar Najjar MD Entered By: Baltazar Najjar on 01/26/2021  09:04:34 -------------------------------------------------------------------------------- Multi-Disciplinary Care Plan Details Patient Name: Date of Service: [name]. 01/26/2021 8:00 A M Medical Record Number: [id number] Patient Account Number: [id number] Date of Birth/Sex: Treating RN: [birth year] (60 y.o. [name] Primary Care Provider: Juluis Rainier Other Clinician: Referring Provider: Treating Provider/Extender: Arelia Longest in Treatment: 12 Multidisciplinary Care Plan reviewed with physician Active Inactive Electronic Signature(s) Signed: 01/26/2021 5:56:44 PM By: [name] Abts RN, BSN Entered By: [name] Abts on 01/26/2021 08:20:01 -------------------------------------------------------------------------------- Pain Assessment Details Patient Name: Date of Service: [name] 01/26/2021 8:00 A M Medical Record Number: [id number] Patient Account Number: [id number] Date of Birth/Sex: Treating RN: 1961-02-17 (60 y.o. [name], [name] Primary Care Provider: Juluis Rainier Other Clinician: Referring Provider: Treating Provider/Extender: Arelia Longest in Treatment: 12 Active Problems Location of Pain Severity and Description of Pain Patient Has Paino No Site Locations Pain Management and Medication Current Pain Management: Electronic Signature(s) Signed: 01/27/2021 5:58:27 PM By: Fonnie Mu RN Entered By: Fonnie Mu on 01/26/2021 08:02:12 -------------------------------------------------------------------------------- Patient/Caregiver Education Details Patient Name: Date of Service: Nelms, [name] 2/28/2022andnbsp8:00 A M Medical Record Number: [id number] Patient Account Number: [id number] Date of Birth/Gender: Treating RN: 07-11-1961 (60 y.o. [name] Primary Care Physician: Juluis Rainier Other Clinician: Referring Physician: Treating Physician/Extender:  Arelia Longest in Treatment: 12 Education Assessment Education Provided To: Patient Education Topics Provided Wound/Skin Impairment: Methods: Explain/Verbal Responses: State content correctly [employer]) Signed: 01/26/2021 5:56:44 PM By: [name] Abts RN, BSN Entered By: [name] Abts on 01/26/2021 08:04:57 -------------------------------------------------------------------------------- Wound Assessment Details Patient Name: Date of Service: Lodge, [name] 01/26/2021 8:00 A M Medical Record Number: [id number] Patient Account Number: [id number] Date of Birth/Sex: Treating RN: July 22, 1961 (60 y.o. [name], [name] Primary Care Provider: Juluis Rainier Other Clinician: Referring Provider: Treating Provider/Extender: Arelia Longest in Treatment: 12 Wound Status Wound Number: 1 Primary Diabetic Wound/Ulcer of the Lower Extremity Etiology: Wound Location: Right, Lateral Foot Wound Healed - Epithelialized Wounding Event: Trauma Status: Date Acquired: 06/03/2020 Comorbid Asthma, Sleep Apnea, Peripheral Venous Disease, Type I Weeks Of Treatment: 12 History: Diabetes, Neuropathy Clustered Wound: No Wound Measurements Length: (cm) Width: (cm) Depth: (cm) Area: (cm) Volume: (cm) 0 % Reduction in Area: 100% 0 % Reduction in Volume: 100% 0 Epithelialization: Large (67-100%) 0 Tunneling: No 0 Undermining: No Wound Description Classification: Grade 2 Wound Margin: Distinct, outline attached Exudate Amount: None Present Foul Odor After Cleansing: No Slough/Fibrino No Wound  Bed Granulation Amount: None Present (0%) Exposed Structure Necrotic Amount: None Present (0%) Fascia Exposed: No Fat Layer (Subcutaneous Tissue) Exposed: No Tendon Exposed: No Muscle Exposed: No Joint Exposed: No Bone Exposed: No Electronic Signature(s) Signed: 01/26/2021 5:56:44 PM By: [name] Abts RN, BSN Signed: 01/27/2021  5:58:27 PM By: Fonnie Mu RN Entered By: [name] Abts on 01/26/2021 08:18:18 -------------------------------------------------------------------------------- Wound Assessment Details Patient Name: Date of Service: [name]. 01/26/2021 8:00 A M Medical Record Number: [id number] Patient Account Number: [id number] Date of Birth/Sex: Treating RN: 09-Jun-1961 (60 y.o. [name], [name] Primary Care Provider: Juluis Rainier Other Clinician: Referring Provider: Treating Provider/Extender: Arelia Longest in Treatment: 12 Wound Status Wound Number: 2 Primary Diabetic Wound/Ulcer of the Lower Extremity Etiology: Wound Location: Right T Second oe Wound Healed - Epithelialized Wounding Event: Blister Status: Date Acquired: 11/27/2020 Comorbid Asthma, Sleep Apnea, Peripheral Venous Disease, Type I Weeks Of Treatment: 8 History: Diabetes, Neuropathy Clustered Wound: No Wound Measurements Length: (cm) Width: (cm) Depth: (cm) Area: (cm) Volume: (cm) 0 % Reduction in Area: 100% 0 % Reduction in Volume: 100% 0 Epithelialization: Large (67-100%) 0 Tunneling: No 0 Undermining: No Wound Description Classification: Grade 1 Wound Margin: Flat and Intact Exudate Amount: None Present Foul Odor After Cleansing: No Slough/Fibrino No Wound Bed Granulation Amount: None Present (0%) Exposed Structure Necrotic Amount: None Present (0%) Fascia Exposed: No Fat Layer (Subcutaneous Tissue) Exposed: No Tendon Exposed: No Muscle Exposed: No Joint Exposed: No Bone Exposed: No Electronic Signature(s) Signed: 01/26/2021 5:56:44 PM By: [name] Abts RN, BSN Signed: 01/27/2021 5:58:27 PM By: Fonnie Mu RN Entered By: [name] Abts on 01/26/2021 08:17:25 -------------------------------------------------------------------------------- Vitals Details Patient Name: Date of Service: [name] Rudd C. 01/26/2021 8:00 A M Medical Record Number:  [id number] Patient Account Number: [id number] Date of Birth/Sex: Treating RN: September 06, 1961 (60 y.o. [name], [name] Primary Care Provider: Juluis Rainier Other Clinician: Referring Provider: Treating Provider/Extender: Arelia Longest in Treatment: 12 Vital Signs Time Taken: 08:00 Temperature (F): 97.5 Height (in): 64 Pulse (bpm): 66 Weight (lbs): 165 Respiratory Rate (breaths/min): 17 Body Mass Index (BMI): 28.3 Blood Pressure (mmHg): 126/80 Capillary Blood Glucose (mg/dl): 784 Reference Range: 80 - 120 mg / dl Electronic Signature(s) Signed: 01/27/2021 5:58:27 PM By: Fonnie Mu RN Entered By: Fonnie Mu on 01/26/2021 08:02:04

## 2021-03-17 ENCOUNTER — Encounter: Payer: Self-pay | Admitting: Podiatry

## 2021-03-17 ENCOUNTER — Other Ambulatory Visit: Payer: Self-pay | Admitting: Podiatry

## 2021-03-17 ENCOUNTER — Ambulatory Visit: Payer: Managed Care, Other (non HMO) | Admitting: Podiatry

## 2021-03-17 ENCOUNTER — Ambulatory Visit (INDEPENDENT_AMBULATORY_CARE_PROVIDER_SITE_OTHER): Payer: Managed Care, Other (non HMO)

## 2021-03-17 ENCOUNTER — Other Ambulatory Visit: Payer: Self-pay

## 2021-03-17 DIAGNOSIS — Q666 Other congenital valgus deformities of feet: Secondary | ICD-10-CM

## 2021-03-17 DIAGNOSIS — M216X1 Other acquired deformities of right foot: Secondary | ICD-10-CM | POA: Diagnosis not present

## 2021-03-17 DIAGNOSIS — E1142 Type 2 diabetes mellitus with diabetic polyneuropathy: Secondary | ICD-10-CM | POA: Insufficient documentation

## 2021-03-17 DIAGNOSIS — Z862 Personal history of diseases of the blood and blood-forming organs and certain disorders involving the immune mechanism: Secondary | ICD-10-CM | POA: Insufficient documentation

## 2021-03-17 DIAGNOSIS — F419 Anxiety disorder, unspecified: Secondary | ICD-10-CM | POA: Insufficient documentation

## 2021-03-17 DIAGNOSIS — F339 Major depressive disorder, recurrent, unspecified: Secondary | ICD-10-CM | POA: Insufficient documentation

## 2021-03-17 DIAGNOSIS — J309 Allergic rhinitis, unspecified: Secondary | ICD-10-CM | POA: Insufficient documentation

## 2021-03-17 DIAGNOSIS — Z794 Long term (current) use of insulin: Secondary | ICD-10-CM | POA: Insufficient documentation

## 2021-03-17 DIAGNOSIS — K644 Residual hemorrhoidal skin tags: Secondary | ICD-10-CM | POA: Insufficient documentation

## 2021-03-17 DIAGNOSIS — Q6689 Other  specified congenital deformities of feet: Secondary | ICD-10-CM

## 2021-03-17 DIAGNOSIS — E1065 Type 1 diabetes mellitus with hyperglycemia: Secondary | ICD-10-CM | POA: Insufficient documentation

## 2021-03-17 DIAGNOSIS — Z8709 Personal history of other diseases of the respiratory system: Secondary | ICD-10-CM | POA: Insufficient documentation

## 2021-03-18 NOTE — Progress Notes (Signed)
She presents today for follow-up of her right foot.  From what I can tell from previous notes and from the patient's history she has had type 1 diabetes for many years and has significant lower extremity neuropathy including not only sensory but motor neuropathy and has been in braces for years.  She had to be taken out of her right brace because of the lesion on her lateral fifth met base.  This results in a long-term treatment plan by Dr. Posey Pronto.  As a result of her being out of her brace that her foot has inverted nearly 90 degrees to her leg with severe spasm of the posterior tibial tendon and tibialis anterior tendons  Objective: Vital signs are stable alert and oriented x3 I have reviewed her past medical history medications allergies surgeries social history and review of systems.  Pulses are palpable bilateral.  Neurologic sensorium is completely diminished deep tendon reflexes nonresponsive muscle strength minimal  Orthopedic evaluation demonstrates a rigidly contracted foot with on inversion.  I am only able to reduce it by approximately half of its deformity.  This is nonpainful to her.  Radiographs taken today demonstrate osseously mature adult with osteopenia and a severely contracted foot medially.  It appears that even the ankle joint is being pulled from its mortise.  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  Plan: I discussed with the patient in great detail today her options which may consist of a medial capsular and tendon release then may result in a brace double foot.  I also discussed with her possible fusion of her rear foot and ankle.  I also discussed with her a below-knee amputation.  I feel that at this time for any of these options to be viable for her she should be sent to Dr. Annitta Jersey at [facility].  We will make this referral.

## 2021-03-24 NOTE — Addendum Note (Signed)
Addended by: Lottie Rater E on: 03/24/2021 03:00 PM   Modules accepted: Orders

## 2021-04-15 ENCOUNTER — Telehealth: Payer: Self-pay | Admitting: Podiatry

## 2021-04-15 DIAGNOSIS — M216X1 Other acquired deformities of right foot: Secondary | ICD-10-CM

## 2021-04-15 NOTE — Telephone Encounter (Signed)
Please refer her to dr. Lajoyce Corners.

## 2021-04-15 NOTE — Telephone Encounter (Signed)
Patients husband called and stated they went to [facility] on 04/13/2021 and the provider stated that she would need an amputation and she needs a referral to someone local (in [place]). Please give patient husband a call and leave message if he doesn't answer. He will be avail after 12:00pm noon.

## 2021-04-21 NOTE — Telephone Encounter (Signed)
Referral was sent to Dr. Lajoyce Corners at [facility]

## 2021-04-21 NOTE — Addendum Note (Signed)
Addended by: Lottie Rater E on: 04/21/2021 11:03 AM   Modules accepted: Orders

## 2021-04-28 ENCOUNTER — Encounter (INDEPENDENT_AMBULATORY_CARE_PROVIDER_SITE_OTHER): Payer: Self-pay | Admitting: Ophthalmology

## 2021-04-28 ENCOUNTER — Ambulatory Visit (INDEPENDENT_AMBULATORY_CARE_PROVIDER_SITE_OTHER): Payer: Managed Care, Other (non HMO) | Admitting: Ophthalmology

## 2021-04-28 ENCOUNTER — Other Ambulatory Visit: Payer: Self-pay

## 2021-04-28 DIAGNOSIS — E103591 Type 1 diabetes mellitus with proliferative diabetic retinopathy without macular edema, right eye: Secondary | ICD-10-CM

## 2021-04-28 DIAGNOSIS — E103512 Type 1 diabetes mellitus with proliferative diabetic retinopathy with macular edema, left eye: Secondary | ICD-10-CM | POA: Diagnosis not present

## 2021-04-28 DIAGNOSIS — E103552 Type 1 diabetes mellitus with stable proliferative diabetic retinopathy, left eye: Secondary | ICD-10-CM

## 2021-04-28 NOTE — Assessment & Plan Note (Signed)
Stable OS, almost no risk for progression of PDR due to extensive PRP nearly "wall-to-wall"

## 2021-04-28 NOTE — Progress Notes (Signed)
04/28/2021     CHIEF COMPLAINT Patient presents for Retina Follow Up (4 Mo F/U OU//Pt denies noticeable changes to [place] OU since last visit. Pt denies ocular pain, flashes of light, or floaters OU. /A1c: 6.4, 03/2021/LBS: 142 this AM)   HISTORY OF PRESENT ILLNESS: [name] is a 60 y.o. female who presents to the clinic today for:   HPI    Retina Follow Up    Diagnosis: Diabetic Retinopathy   Laterality: both eyes   Onset: 4 months ago   Severity: mild   Duration: 4 months   Course: stable   Comments: 4 Mo F/U OU  Pt denies noticeable changes to [place] OU since last visit. Pt denies ocular pain, flashes of light, or floaters OU.  A1c: 6.4, 03/2021 LBS: 142 this AM       Last edited by Ileana Roup, COA on 04/28/2021 10:35 AM. (History)      Referring physician: Juluis Rainier, MD [address] [place],  [place] [address]  HISTORICAL INFORMATION:   Selected notes from the MEDICAL RECORD NUMBER    Lab Results  Component Value Date   HGBA1C 5.0 06/10/2020     CURRENT MEDICATIONS: Current Outpatient Medications (Ophthalmic Drugs)  Medication Sig  . Carboxymethylcellul-Glycerin (REFRESH OPTIVE OP) Place 1 drop into both eyes as needed (dry eyes).   . cycloSPORINE (RESTASIS) 0.05 % ophthalmic emulsion Place 1 drop into both eyes 2 (two) times daily.    No current facility-administered medications for this visit. (Ophthalmic Drugs)   Current Outpatient Medications (Other)  Medication Sig  . albuterol (PROVENTIL HFA;VENTOLIN HFA) 108 (90 BASE) MCG/ACT inhaler Inhale 1 puff into the lungs as needed for wheezing or shortness of breath.  Marland Kitchen ammonium lactate (AMLACTIN) 12 % lotion Apply 1 application topically as needed for dry skin.  Marland Kitchen aspirin 81 MG chewable tablet Chew 81 mg by mouth daily.  . Choline Fenofibrate (FENOFIBRIC ACID) 45 MG CPDR Take 45 mg by mouth 3 (three) times daily.  . collagenase (SANTYL) ointment 1 application  . Continuous Blood Gluc Sensor  (DEXCOM G6 SENSOR) MISC SMARTSIG:1 Each Topical Every 10 Days  . Continuous Blood Gluc Transmit (DEXCOM G6 TRANSMITTER) MISC   . DULERA 100-5 MCG/ACT AERO Inhale 2 puffs into the lungs 2 (two) times daily.  Marland Kitchen escitalopram (LEXAPRO) 10 MG tablet Take 10 mg by mouth 2 (two) times daily.   Marland Kitchen estradiol (ESTRACE) 2 MG tablet Take 2 mg by mouth daily.  . famotidine (PEPCID) 10 MG tablet Take 10 mg by mouth 2 (two) times daily.  Marland Kitchen gabapentin (NEURONTIN) 300 MG capsule Take 1 capsule (300 mg total) by mouth 3 (three) times daily.  . hydrocortisone (ANUSOL-HC) 2.5 % rectal cream Place rectally 2 (two) times daily as needed.  . hydrocortisone 2.5 % cream Apply 1 application topically daily as needed (itching).   . Insulin Human (INSULIN PUMP) SOLN Inject 1 each into the skin. Insulin pump MEDTRONIC with humalog (Will be changing to TANDEM).Works with DEXCOM 4 CGM  . insulin lispro (HUMALOG) 100 UNIT/ML injection Has with insulin pump  . lamoTRIgine (LAMICTAL) 150 MG tablet TAKE 1 TABLET BY MOUTH TWICE DAILY (Patient taking differently: Take 150 mg by mouth 2 (two) times daily. )  . lansoprazole (PREVACID) 30 MG capsule Take 30 mg by mouth 2 (two) times daily before a meal.  . levocetirizine (XYZAL) 5 MG tablet Take 5 mg by mouth 2 (two) times daily.   Marland Kitchen levothyroxine (SYNTHROID, LEVOTHROID) 112 MCG  tablet Take 112 mcg by mouth daily.  . mometasone (NASONEX) 50 MCG/ACT nasal spray Place 2 sprays into the nose daily. OTC  . montelukast (SINGULAIR) 10 MG tablet Take 10 mg by mouth at bedtime.  . Multiple Vitamins-Minerals (MULTIVITAMIN GUMMIES ADULT PO) Take 2 each by mouth daily.   . Pancrelipase, Lip-Prot-Amyl, (PANCREAZE PO) Take 3-4 capsules by mouth See admin instructions. Take 4 capsules with a meal and 3 capsules with a snack  . primidone (MYSOLINE) 50 MG tablet TAKE 1 TABLET BY MOUTH IN THE MORNING , 1 TABLET AT NOON AND ONE AND ONE-HALF (1 & 1/2) TABLETS IN THE EVENING (Patient taking differently:  Take 50-75 mg by mouth See admin instructions. Take 50mg  in the morning, 50mg  at noon and 75mg  in the evening.)  . rosuvastatin (CRESTOR) 40 MG tablet Take 40 mg by mouth every evening.   . Simethicone (GAS-X EXTRA STRENGTH PO) Take 1 tablet by mouth as needed (gas relief).    No current facility-administered medications for this visit. (Other)      REVIEW OF SYSTEMS:    ALLERGIES Allergies  Allergen Reactions  . Ceftriaxone     Other reaction(s): diarrhea  . Codeine   . Food     MSG and Lactose (cannot have any dairy products)   . Milk-Related Compounds     Other reaction(s): diarrhea  . Monosodium Glutamate     Other reaction(s): diarrhea  . Other Nausea And Vomiting    MSG and Lactose Intolerant Other reaction(s): diarrhea Other reaction(s): hives Other reaction(s): pass out  . Penicillins     She states she can have penicillin derivatives  . Propranolol Hcl     Other reaction(s): fainting    PAST MEDICAL HISTORY Past Medical History:  Diagnosis Date  . Adhesive capsulitis 2013   Bilateral shoulders  . Adhesive capsulitis of both shoulders 2013  . Anemia   . Cataract   . Chronic kidney disease   . Depression   . Diabetes mellitus without complication (HCC)   . Diabetic retinopathy (HCC) 2010  . Diabetic retinopathy (HCC) 2010   Left eye, mild  . Essential tremor 2011   Bilateral hands  . Foot drop, bilateral 2007  . Foot fracture, left   . Gastroparesis 2009  . GERD (gastroesophageal reflux disease)   . Headache   . Hemorrhage 2011   Laser surgery x2  . High potassium 2012   Normal as of 2014  . Hypercholesterolemia 2010  . Hyperlipidemia   . Neuropathy   . Pneumonia   . Postnasal drip 2010  . Scratched cornea 2013   Left eye  . Sleep apnea    does not wear CPAP  . Stage 3 chronic kidney disease (HCC)   . Thyroid disease   . TMJ (temporomandibular joint disorder)   . Type 1 diabetes [facility])    Past Surgical History:  Procedure Laterality  Date  . ABDOMINAL HYSTERECTOMY  2007  . APPENDECTOMY    . BALLOON DILATION N/A 01/24/2017   Procedure: BALLOON DILATION;  Surgeon: [facility], MD;  Location: [facility];  Service: Endoscopy;  Laterality: N/A;  . CARPAL TUNNEL RELEASE Right 1990  . CATARACT EXTRACTION Bilateral 2008  . COLONOSCOPY  2007  . COLONOSCOPY WITH PROPOFOL N/A 01/24/2017   Procedure: COLONOSCOPY WITH PROPOFOL;  Surgeon: 2009, MD;  Location: [facility];  Service: Endoscopy;  Laterality: N/A;  . ESOPHAGOGASTRODUODENOSCOPY (EGD) WITH PROPOFOL N/A 01/24/2017   Procedure: ESOPHAGOGASTRODUODENOSCOPY (EGD) WITH PROPOFOL;  Surgeon: 01/26/2017  Bosie ClosSchooler, MD;  [facility];  Service: Endoscopy;  Laterality: N/A;  moved oer MD request  . EYE SURGERY Left 2012   Pars plana viterctomy, membrane peel, endolaser  . ROOT CANAL  2007  . ROOT CANAL  2010  . Thorat dilation  2007  . Throat dilation  2007  . Throat dilation  2010, 0ct 2011   Sep 2010, Nov 2010, 2014  . TONSILLECTOMY  1971  . Trigger fingers  2008  . WISDOM TOOTH EXTRACTION  1977    FAMILY HISTORY Family History  Problem Relation Age of Onset  . Hypertension Mother   . Diabetes Father   . COPD Father   . Pneumonia Father   . Stroke Maternal Grandmother   . Hypertension Maternal Grandmother   . Diabetes Maternal Grandfather   . Parkinson's disease Maternal Grandfather   . Heart disease Maternal Grandfather   . Parkinsonism Neg Hx   . Neuropathy Neg Hx   . Breast cancer Neg Hx     SOCIAL HISTORY Social History   Tobacco Use  . Smoking status: Never Smoker  . Smokeless tobacco: Never Used  Vaping Use  . Vaping Use: Never used  Substance Use Topics  . Alcohol use: No    Alcohol/week: 0.0 standard drinks  . Drug use: No         OPHTHALMIC EXAM: Base Eye Exam    Visual Acuity (ETDRS)      Right Left   Dist cc 20/25 -1 20/40 +1   Dist ph cc  20/30 -1   Correction: Glasses       Tonometry (Tonopen, 10:39 AM)       Right Left   Pressure 15 18       Pupils      Pupils Dark Light Shape React APD   Right PERRL 4 3 Round Slow None   Left PERRL 4 3 Round Slow None       Visual Fields (Counting fingers)      Left Right    Full    Restrictions  Partial outer inferior nasal deficiency       Extraocular Movement      Right Left    Full Full       Neuro/Psych    Oriented x3: Yes   Mood/Affect: Normal       Dilation    Both eyes: 1.0% Mydriacyl, 2.5% Phenylephrine @ 10:39 AM        Slit Lamp and Fundus Exam    External Exam      Right Left   External Normal Normal       Slit Lamp Exam      Right Left   Lids/Lashes Normal Normal   Conjunctiva/Sclera White and quiet White and quiet   Cornea Clear Clear   Anterior Chamber Deep and quiet Deep and quiet   Iris Round and reactive Round and reactive   Lens Centered posterior chamber intraocular lens Centered posterior chamber intraocular lens   Anterior Vitreous Normal Normal       Fundus Exam      Right Left   Posterior Vitreous Normal Normal   Disc Normal Normal   C/D Ratio 0.2 0.2   Macula Microaneurysms, no macular thickening, no clinically significant macular edema Microaneurysms, no macular thickening, no clinically significant macular edema   Vessels PDR-quiet PDR-quiet   Periphery Good PRP nearly 360 room inferonasal for more PRP in the future Complete PRP 360 OS  IMAGING AND PROCEDURES  Imaging and Procedures for 04/28/21  Color Fundus Photography Optos - OU - Both Eyes       Right Eye Progression has worsened. Disc findings include normal observations. Vessels : Neovascularization.   Notes Moderate scattered preretinal and vitreous hemorrhage noted particularly nasal to the nerve OD.  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  Room for more laser only inferonasal should it be required in the future  Stable treated proliferative diabetic retinopathy of left eye with macular edema determined by examination associated with type 1 diabetes mellitus (HCC) Stable OS, almost no risk for progression of PDR due to extensive PRP nearly "wall-to-wall"      ICD-10-CM   1. Proliferative diabetic retinopathy of right eye without macular edema associated with type 1 diabetes mellitus (HCC)  E10.3591 Color Fundus Photography Optos - OU - Both Eyes  2. Stable treated proliferative diabetic retinopathy of left eye with macular edema determined by examination associated with type 1 diabetes mellitus (HCC)  H68.3729    M21.1155     1.  Quiescent PDR OU will continue to observe.  2.  No active maculopathy.  3.  Ophthalmic Meds Ordered this visit:  No orders of the defined types were placed in this encounter.      Return in about 6 months (around 10/28/2021) for DILATE OU, COLOR FP, OCT.  There are no Patient Instructions on file for this visit.   Explained the diagnoses, plan, and follow up with the patient and they expressed understanding.  Patient expressed understanding of the importance of proper follow up care.   Alford Highland Reginold Beale M.D. Diseases & Surgery of the Retina and Vitreous [facility] 04/28/21     Abbreviations: M myopia (nearsighted); A astigmatism; H hyperopia (farsighted); P presbyopia; Mrx spectacle prescription;  CTL contact lenses; OD right eye; OS left eye; OU both eyes  XT exotropia; ET esotropia; PEK punctate epithelial keratitis; PEE punctate epithelial erosions; DES dry eye syndrome; MGD meibomian gland dysfunction; ATs artificial tears; PFAT's preservative free artificial tears; NSC nuclear sclerotic  cataract; PSC posterior subcapsular cataract; ERM epi-retinal membrane; PVD posterior vitreous detachment; RD retinal detachment; DM diabetes mellitus; DR diabetic retinopathy; NPDR non-proliferative diabetic retinopathy; PDR proliferative diabetic retinopathy; CSME clinically significant macular edema; DME diabetic macular edema; dbh dot blot hemorrhages; CWS cotton wool spot; POAG primary open angle glaucoma; C/D cup-to-disc ratio; HVF humphrey visual field; GVF goldmann visual field; OCT optical coherence tomography; IOP intraocular pressure; BRVO Branch retinal vein occlusion; CRVO central retinal vein occlusion; CRAO central retinal artery occlusion; BRAO branch retinal artery occlusion; RT retinal tear; SB scleral buckle; PPV pars plana vitrectomy; VH Vitreous hemorrhage; PRP panretinal laser photocoagulation; IVK intravitreal kenalog; VMT vitreomacular traction; MH Macular hole;  NVD neovascularization of the disc; NVE neovascularization elsewhere; AREDS age related eye disease study; ARMD age related macular degeneration; POAG primary open angle glaucoma; EBMD epithelial/anterior basement membrane dystrophy; ACIOL anterior chamber intraocular lens; IOL intraocular lens; PCIOL posterior chamber intraocular lens; Phaco/IOL phacoemulsification with intraocular lens placement; PRK photorefractive keratectomy; LASIK laser assisted in situ keratomileusis; HTN hypertension; DM diabetes mellitus; COPD chronic obstructive  pulmonary disease

## 2021-04-28 NOTE — Assessment & Plan Note (Signed)
Status post PRP OD, no progression of PDR.  Room for more laser only inferonasal should it be required in the future

## 2021-05-04 ENCOUNTER — Other Ambulatory Visit: Payer: Self-pay

## 2021-05-04 ENCOUNTER — Ambulatory Visit (INDEPENDENT_AMBULATORY_CARE_PROVIDER_SITE_OTHER): Payer: Managed Care, Other (non HMO) | Admitting: Orthopedic Surgery

## 2021-05-04 ENCOUNTER — Other Ambulatory Visit: Payer: Self-pay | Admitting: Physician Assistant

## 2021-05-04 DIAGNOSIS — M21961 Unspecified acquired deformity of right lower leg: Secondary | ICD-10-CM

## 2021-05-04 DIAGNOSIS — M25571 Pain in right ankle and joints of right foot: Secondary | ICD-10-CM | POA: Diagnosis not present

## 2021-05-06 ENCOUNTER — Other Ambulatory Visit (HOSPITAL_COMMUNITY)
Admission: RE | Admit: 2021-05-06 | Discharge: 2021-05-06 | Disposition: A | Discharge status: Home / Self Care | Payer: Managed Care, Other (non HMO) | Source: Ambulatory Visit | Attending: Orthopedic Surgery | Admitting: Orthopedic Surgery

## 2021-05-06 DIAGNOSIS — Z01812 Encounter for preprocedural laboratory examination: Secondary | ICD-10-CM | POA: Insufficient documentation

## 2021-05-06 DIAGNOSIS — Z20822 Contact with and (suspected) exposure to covid-19: Secondary | ICD-10-CM | POA: Insufficient documentation

## 2021-05-06 LAB — SARS CORONAVIRUS 2 (TAT 6-24 HRS): SARS Coronavirus 2: NEGATIVE

## 2021-05-07 ENCOUNTER — Encounter (HOSPITAL_COMMUNITY): Payer: Self-pay | Admitting: Orthopedic Surgery

## 2021-05-07 ENCOUNTER — Encounter: Payer: Self-pay | Admitting: Orthopedic Surgery

## 2021-05-07 NOTE — Progress Notes (Signed)
Office Visit Note   Patient: [name]           Date of Birth: 09-04-1961           MRN: [id number] Visit Date: 05/04/2021              Requested by: Elinor Parkinson, DPM [address] [place],  [place] [address] PCP: Soundra Pilon, [name]  Chief Complaint  Patient presents with   Right Foot - Pain      HPI: Patient is a 60 year old woman who was seen for initial evaluation for right foot deformity.  Patient has a contraction with an ankle deformity of 90 degrees.  Patient has attempted conservative management with immobilization fracture boot nonweightbearing in a wheelchair.  Patient states that due to the persistent deformity she is unable to perform her activities of daily living.  Her most recent hemoglobin A1c is 7.8.  Patient is currently wearing a ankle-foot arthrosis on the left with a plantigrade foot on the left.  Patient states that due to the deformity of the right foot she has been in bed since last July essentially 1 year.  She has a dropfoot on the left does not have motor function in the right.  Patient states the deformity started with a infection where she was treated at the wound center clinic.   Assessment & Plan: Visit Diagnoses:  1. Pain in right ankle and joints of right foot   2. Acquired ankle deformity, right     Plan: Due to the progressive fixed deformity of the ankle with history of infection discussed surgical options would be tibial calcaneal fusion versus amputation.  Patient states that she has discussed this at length and would like to proceed with the amputation.  We will set this up at her convenience.  Follow-Up Instructions: Return in about 2 weeks (around 05/18/2021).   Ortho Exam  Patient is alert, oriented, no adenopathy, well-dressed, normal affect, normal respiratory effort. Examination patient does not have a palpable pulse she has a fixed deformity with the ankle varus of 90 degrees with essentially the lateral aspect of her  foot being plantigrade on the floor.  This is a fixed deformity that is not correctable.  Patient's leg is thin and atrophic.  No ascending cellulitis she does have a history of infection.  The left foot is plantigrade wearing a posterior ankle-foot orthosis from [employer].  Most recent hemoglobin A1c 2 weeks ago was 6.4.  Imaging: No results found. No images are attached to the encounter.  Labs: Lab Results  Component Value Date   HGBA1C 5.0 06/10/2020   ESRSEDRATE 65 (H) 06/10/2020   CRP 6.3 (H) 06/10/2020   REPTSTATUS 06/15/2020 FINAL 06/10/2020   CULT  06/10/2020    NO GROWTH 5 DAYS Performed at [facility], [address] [place], [place] [address]      Lab Results  Component Value Date   ALBUMIN 3.4 (L) 06/13/2020   ALBUMIN 3.2 (L) 06/12/2020   ALBUMIN 3.0 (L) 06/11/2020   PREALBUMIN 12.6 (L) 06/10/2020    Lab Results  Component Value Date   MG 2.4 06/13/2020   MG 2.3 06/12/2020   No results found for: [facility]  Lab Results  Component Value Date   PREALBUMIN 12.6 (L) 06/10/2020   CBC EXTENDED Latest Ref Rng & Units 06/13/2020 06/12/2020 06/11/2020  WBC 4.0 - 10.5 K/uL 10.4 9.1 10.6(H)  RBC 3.87 - 5.11 MIL/uL 3.72(L) 3.61(L) 3.33(L)  HGB 12.0 - 15.0 g/dL 11.8(L) 11.3(L) 10.8(L)  HCT 36.0 - 46.0 % 37.3 36.2 33.3(L)  PLT 150 - 400 K/uL 355 346 321  NEUTROABS 1.7 - 7.7 K/uL 6.6 5.4 -  LYMPHSABS 0.7 - 4.0 K/uL 2.6 2.5 -     There is no height or weight on file to calculate BMI.  Orders:  No orders of the defined types were placed in this encounter.  No orders of the defined types were placed in this encounter.    Procedures: No procedures performed  Clinical Data: No additional findings.  ROS:  All other systems negative, except as noted in the HPI. Review of Systems  Objective: Vital Signs: There were no vitals taken for this visit.  Specialty Comments:  No specialty comments available.  PMFS History: Patient Active Problem List    Diagnosis Date Noted   Allergic rhinitis 03/17/2021   Anxiety 03/17/2021   Diabetic polyneuropathy (HCC) 03/17/2021   External hemorrhoids 03/17/2021   History of anemia 03/17/2021   Hyperglycemia due to type 1 diabetes mellitus (HCC) 03/17/2021   Long term (current) use of insulin (HCC) 03/17/2021   Personal history of other diseases of the respiratory system 03/17/2021   Recurrent major depression (HCC) 03/17/2021   Proliferative diabetic retinopathy of right eye without macular edema associated with type 1 diabetes mellitus (HCC) 12/25/2020   Stable treated proliferative diabetic retinopathy of left eye with macular edema determined by examination associated with type 1 diabetes mellitus (HCC) 12/23/2020   Vitreous hemorrhage, right eye (HCC) 12/23/2020   Cellulitis 06/11/2020   Diabetic foot infection (HCC) 06/10/2020   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 03/05/2019   Special screening for malignant neoplasms, colon 01/24/2017   Difficulty in swallowing 01/24/2017   Esophageal reflux 01/24/2017   DM (diabetes mellitus) type 1 with ketoacidosis (HCC) 03/01/2015   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) 03/01/2015   Diabetic neuropathy (HCC) 03/01/2015   Hypothyroidism 03/01/2015   Depression 03/01/2015   Orthostatic hypotension 03/01/2015   Essential tremor 03/01/2015   Diabetic retinopathy (HCC) 03/01/2015   Gastroparesis due to DM (HCC) 03/01/2015   Chronic renal insufficiency 03/01/2015   Foot drop, bilateral 03/01/2015   HLD (hyperlipidemia) 03/01/2015   Obstructive sleep apnea 03/01/2015   Past Medical History:  Diagnosis Date   Adhesive capsulitis 2013   Bilateral shoulders   Adhesive capsulitis of both shoulders 2013   Anemia    Cataract    Chronic kidney disease    Depression    Diabetes mellitus without complication (HCC)    Diabetic retinopathy (HCC) 2010   Diabetic retinopathy (HCC) 2010   Left eye, mild   Essential  tremor 2011   Bilateral hands   Foot drop, bilateral 2007   Foot fracture, left    Gastroparesis 2009   GERD (gastroesophageal reflux disease)    Headache    Hemorrhage 2011   Laser surgery x2   High potassium 2012   Normal as of 2014   Hypercholesterolemia 2010   Hyperlipidemia    Neuropathy    Pneumonia    Postnasal drip 2010   Scratched cornea 2013   Left eye   Sleep apnea    does not wear CPAP   Stage 3 chronic kidney disease (HCC)    Thyroid disease    TMJ (temporomandibular joint disorder)    Type 1 diabetes (HCC)     Family History  Problem Relation Age of Onset   Hypertension Mother    Diabetes  Father    COPD Father    Pneumonia Father    Stroke Maternal Grandmother    Hypertension Maternal Grandmother    Diabetes Maternal Grandfather    Parkinson's disease Maternal Grandfather    Heart disease Maternal Grandfather    Parkinsonism Neg Hx    Neuropathy Neg Hx    Breast cancer Neg Hx     Past Surgical History:  Procedure Laterality Date   ABDOMINAL HYSTERECTOMY  2007   APPENDECTOMY     BALLOON DILATION N/A 01/24/2017   Procedure: BALLOON DILATION;  Surgeon: Charlott Rakes, MD;  [facility]: [facility];  Service: Endoscopy;  Laterality: N/A;   CARPAL TUNNEL RELEASE Right 1990   CATARACT EXTRACTION Bilateral 2008   COLONOSCOPY  2007   COLONOSCOPY WITH PROPOFOL N/A 01/24/2017   Procedure: COLONOSCOPY WITH PROPOFOL;  Surgeon: Charlott Rakes, MD;  Location: [facility];  Service: Endoscopy;  Laterality: N/A;   ESOPHAGOGASTRODUODENOSCOPY (EGD) WITH PROPOFOL N/A 01/24/2017   Procedure: ESOPHAGOGASTRODUODENOSCOPY (EGD) WITH PROPOFOL;  Surgeon: Charlott Rakes, MD;  [facility];  Service: Endoscopy;  Laterality: N/A;  moved oer MD request   EYE SURGERY Left 2012   Pars plana viterctomy, membrane peel, endolaser   ROOT CANAL  2007   ROOT CANAL  2010   Thorat dilation  2007   Throat dilation  2007   Throat dilation  2010, 0ct 2011   Sep 2010, Nov  2010, 2014   TONSILLECTOMY  1971   Trigger fingers  2008   WISDOM TOOTH EXTRACTION  1977   Social History   Occupational History   Not on file  Tobacco Use   Smoking status: Never   Smokeless tobacco: Never  Vaping Use   Vaping Use: Never used  Substance and Sexual Activity   Alcohol use: No    Alcohol/week: 0.0 standard drinks   Drug use: No   Sexual activity: Not on file

## 2021-05-07 NOTE — Progress Notes (Addendum)
Mrs. Shelia Kingsberry denies chest pain or shortness of breath.  Mrs Anne [name] tested negative for Covid On 04/05/21 and had been in the home since being tested.   [name] has type I diabetes, patient has a Dexacom continous blood sugar reader with IQ.  Patient also has an Insulin pump.  [name] reports that her Endocrinologist has not been give instructions on how to manage pump if she's not eating. Endocrinologist is Dr. Sharl Ma with [facility].  I instructed patient to decrease basal rate by 20% at midnight.  Mrs. Synder cannot see the the basal rate, she will have he husband decrease the rate.I instructed patient to pause the automatic adjustment of Insulin - the IQ'.  I instructed Mrs. Synder to check CBG in am if it is less than 70 , she will suspend the basal and will take glucose tabs, then recheck CBD in 15 mins. I requested Office records from Dr Daune Perch office, [name] reported that last A1C was 6.4- approximately  weeks ago.  I call ed [name] McDaaniel , Diabetes Coordinator and went on the instructions I gave Mrs. Launa Flight confirmed that the instructions were correct.

## 2021-05-08 ENCOUNTER — Inpatient Hospital Stay (HOSPITAL_COMMUNITY): Payer: Managed Care, Other (non HMO) | Admitting: Physician Assistant

## 2021-05-08 ENCOUNTER — Encounter (HOSPITAL_COMMUNITY)
Admission: RE | Disposition: A | Discharge status: Skilled Nursing Facility | Payer: Self-pay | Source: Home / Self Care | Attending: Orthopedic Surgery

## 2021-05-08 ENCOUNTER — Inpatient Hospital Stay (HOSPITAL_COMMUNITY)
Admission: RE | Admit: 2021-05-08 | Discharge: 2021-05-15 | DRG: 476 | Disposition: A | Discharge status: Skilled Nursing Facility | Payer: Managed Care, Other (non HMO) | Attending: Orthopedic Surgery | Admitting: Orthopedic Surgery

## 2021-05-08 ENCOUNTER — Encounter (HOSPITAL_COMMUNITY): Payer: Self-pay | Admitting: Orthopedic Surgery

## 2021-05-08 ENCOUNTER — Other Ambulatory Visit: Payer: Self-pay

## 2021-05-08 DIAGNOSIS — E785 Hyperlipidemia, unspecified: Secondary | ICD-10-CM | POA: Diagnosis present

## 2021-05-08 DIAGNOSIS — M14671 Charcot's joint, right ankle and foot: Principal | ICD-10-CM

## 2021-05-08 DIAGNOSIS — Z794 Long term (current) use of insulin: Secondary | ICD-10-CM | POA: Diagnosis not present

## 2021-05-08 DIAGNOSIS — Z885 Allergy status to narcotic agent status: Secondary | ICD-10-CM | POA: Diagnosis not present

## 2021-05-08 DIAGNOSIS — Z79890 Hormone replacement therapy (postmenopausal): Secondary | ICD-10-CM

## 2021-05-08 DIAGNOSIS — E7800 Pure hypercholesterolemia, unspecified: Secondary | ICD-10-CM | POA: Diagnosis present

## 2021-05-08 DIAGNOSIS — Z833 Family history of diabetes mellitus: Secondary | ICD-10-CM | POA: Diagnosis not present

## 2021-05-08 DIAGNOSIS — Z91011 Allergy to milk products: Secondary | ICD-10-CM

## 2021-05-08 DIAGNOSIS — N183 Chronic kidney disease, stage 3 unspecified: Secondary | ICD-10-CM | POA: Diagnosis present

## 2021-05-08 DIAGNOSIS — Z90710 Acquired absence of both cervix and uterus: Secondary | ICD-10-CM

## 2021-05-08 DIAGNOSIS — G4730 Sleep apnea, unspecified: Secondary | ICD-10-CM | POA: Diagnosis present

## 2021-05-08 DIAGNOSIS — K219 Gastro-esophageal reflux disease without esophagitis: Secondary | ICD-10-CM | POA: Diagnosis present

## 2021-05-08 DIAGNOSIS — G250 Essential tremor: Secondary | ICD-10-CM | POA: Diagnosis present

## 2021-05-08 DIAGNOSIS — Z7982 Long term (current) use of aspirin: Secondary | ICD-10-CM

## 2021-05-08 DIAGNOSIS — F32A Depression, unspecified: Secondary | ICD-10-CM | POA: Diagnosis present

## 2021-05-08 DIAGNOSIS — Z888 Allergy status to other drugs, medicaments and biological substances status: Secondary | ICD-10-CM | POA: Diagnosis not present

## 2021-05-08 DIAGNOSIS — M21961 Unspecified acquired deformity of right lower leg: Secondary | ICD-10-CM | POA: Diagnosis present

## 2021-05-08 DIAGNOSIS — Z9641 Presence of insulin pump (external) (internal): Secondary | ICD-10-CM | POA: Diagnosis present

## 2021-05-08 DIAGNOSIS — Z91018 Allergy to other foods: Secondary | ICD-10-CM

## 2021-05-08 DIAGNOSIS — E1022 Type 1 diabetes mellitus with diabetic chronic kidney disease: Secondary | ICD-10-CM | POA: Diagnosis present

## 2021-05-08 DIAGNOSIS — Z9102 Food additives allergy status: Secondary | ICD-10-CM

## 2021-05-08 DIAGNOSIS — Z20822 Contact with and (suspected) exposure to covid-19: Secondary | ICD-10-CM | POA: Diagnosis present

## 2021-05-08 DIAGNOSIS — E079 Disorder of thyroid, unspecified: Secondary | ICD-10-CM | POA: Diagnosis present

## 2021-05-08 DIAGNOSIS — Z9049 Acquired absence of other specified parts of digestive tract: Secondary | ICD-10-CM | POA: Diagnosis not present

## 2021-05-08 DIAGNOSIS — Z79899 Other long term (current) drug therapy: Secondary | ICD-10-CM

## 2021-05-08 DIAGNOSIS — M79671 Pain in right foot: Secondary | ICD-10-CM | POA: Diagnosis present

## 2021-05-08 HISTORY — PX: AMPUTATION: SHX166

## 2021-05-08 HISTORY — DX: Myoneural disorder, unspecified: G70.9

## 2021-05-08 LAB — ABO/RH: ABO/RH(D): A POS

## 2021-05-08 LAB — CBC
HCT: 40 % (ref 36.0–46.0)
Hemoglobin: 12.4 g/dL (ref 12.0–15.0)
MCH: 30.4 pg (ref 26.0–34.0)
MCHC: 31 g/dL (ref 30.0–36.0)
MCV: 98 fL (ref 80.0–100.0)
Platelets: 255 10*3/uL (ref 150–400)
RBC: 4.08 MIL/uL (ref 3.87–5.11)
RDW: 13.3 % (ref 11.5–15.5)
WBC: 11.2 10*3/uL — ABNORMAL HIGH (ref 4.0–10.5)
nRBC: 0 % (ref 0.0–0.2)

## 2021-05-08 LAB — BASIC METABOLIC PANEL
Anion gap: 10 (ref 5–15)
BUN: 13 mg/dL (ref 6–20)
CO2: 24 mmol/L (ref 22–32)
Calcium: 9.1 mg/dL (ref 8.9–10.3)
Chloride: 104 mmol/L (ref 98–111)
Creatinine, Ser: 1.12 mg/dL — ABNORMAL HIGH (ref 0.44–1.00)
GFR, Estimated: 57 mL/min — ABNORMAL LOW (ref >60)
Glucose, Bld: 108 mg/dL — ABNORMAL HIGH (ref 70–99)
Potassium: 4.1 mmol/L (ref 3.5–5.1)
Sodium: 138 mmol/L (ref 135–145)

## 2021-05-08 LAB — GLUCOSE, CAPILLARY
Glucose-Capillary: 93 mg/dL (ref 70–99)
Glucose-Capillary: 152 mg/dL — ABNORMAL HIGH (ref 70–99)
Glucose-Capillary: 154 mg/dL — ABNORMAL HIGH (ref 70–99)
Glucose-Capillary: 212 mg/dL — ABNORMAL HIGH (ref 70–99)
Glucose-Capillary: 136 mg/dL — ABNORMAL HIGH (ref 70–99)

## 2021-05-08 LAB — TYPE AND SCREEN
ABO/RH(D): A POS
Antibody Screen: NEGATIVE

## 2021-05-08 LAB — HEMOGLOBIN A1C
Hgb A1c MFr Bld: 7 % — ABNORMAL HIGH (ref 4.8–5.6)
Mean Plasma Glucose: 154 mg/dL

## 2021-05-08 SURGERY — AMPUTATION BELOW KNEE
Anesthesia: General | Site: Knee | Laterality: Right

## 2021-05-08 MED ORDER — MAGNESIUM SULFATE 2 GM/50ML IV SOLN: 2.0000 g | Freq: Every day | INTRAVENOUS | Status: DC | PRN

## 2021-05-08 MED ORDER — 0.9 % SODIUM CHLORIDE (POUR BTL) OPTIME
TOPICAL | Status: DC | PRN
  Administered 2021-05-08: 1000 mL

## 2021-05-08 MED ORDER — POTASSIUM CHLORIDE CRYS ER 20 MEQ PO TBCR: 20.0000 meq | EXTENDED_RELEASE_TABLET | Freq: Every day | ORAL | Status: DC | PRN

## 2021-05-08 MED ORDER — DEXAMETHASONE SODIUM PHOSPHATE 10 MG/ML IJ SOLN
INTRAMUSCULAR | Status: AC
  Filled 2021-05-08: qty 1

## 2021-05-08 MED ORDER — CEFAZOLIN SODIUM-DEXTROSE 2-4 GM/100ML-% IV SOLN
2.0000 g | Freq: Three times a day (TID) | INTRAVENOUS | Status: AC
  Administered 2021-05-08 – 2021-05-09 (×2): 2 g via INTRAVENOUS
  Filled 2021-05-08 (×2): qty 100

## 2021-05-08 MED ORDER — ONDANSETRON HCL 4 MG/2ML IJ SOLN
INTRAMUSCULAR | Status: DC | PRN
  Administered 2021-05-08: 4 mg via INTRAVENOUS

## 2021-05-08 MED ORDER — TRANEXAMIC ACID-NACL 1000-0.7 MG/100ML-% IV SOLN
1000.0000 mg | Freq: Once | INTRAVENOUS | Status: DC
  Filled 2021-05-08: qty 100

## 2021-05-08 MED ORDER — PROMETHAZINE HCL 25 MG/ML IJ SOLN: 6.2500 mg | INTRAMUSCULAR | Status: DC | PRN

## 2021-05-08 MED ORDER — EPHEDRINE SULFATE-NACL 50-0.9 MG/10ML-% IV SOSY
PREFILLED_SYRINGE | INTRAVENOUS | Status: DC | PRN
  Administered 2021-05-08 (×2): 10 mg via INTRAVENOUS

## 2021-05-08 MED ORDER — ASCORBIC ACID 500 MG PO TABS
1000.0000 mg | ORAL_TABLET | Freq: Every day | ORAL | Status: DC
  Administered 2021-05-08 – 2021-05-15 (×8): 1000 mg via ORAL
  Filled 2021-05-08 (×8): qty 2

## 2021-05-08 MED ORDER — OXYCODONE HCL 5 MG PO TABS
5.0000 mg | ORAL_TABLET | ORAL | Status: DC | PRN
Start: 2021-05-08 — End: 2021-05-15
  Administered 2021-05-08 – 2021-05-11 (×10): 10 mg via ORAL
  Administered 2021-05-12 (×2): 5 mg via ORAL
  Administered 2021-05-13 – 2021-05-14 (×3): 10 mg via ORAL
  Administered 2021-05-14: 5 mg via ORAL
  Administered 2021-05-15 (×2): 10 mg via ORAL
  Filled 2021-05-08 (×2): qty 2
  Filled 2021-05-08: qty 1
  Filled 2021-05-08 (×6): qty 2
  Filled 2021-05-08: qty 1
  Filled 2021-05-08 (×3): qty 2
  Filled 2021-05-08: qty 1
  Filled 2021-05-08 (×4): qty 2

## 2021-05-08 MED ORDER — OXYCODONE HCL 5 MG PO TABS: 5.0000 mg | ORAL_TABLET | Freq: Once | ORAL | Status: DC | PRN

## 2021-05-08 MED ORDER — MIDAZOLAM HCL 2 MG/2ML IJ SOLN
INTRAMUSCULAR | Status: AC
  Filled 2021-05-08: qty 2

## 2021-05-08 MED ORDER — GUAIFENESIN-DM 100-10 MG/5ML PO SYRP: 15.0000 mL | ORAL_SOLUTION | ORAL | Status: DC | PRN

## 2021-05-08 MED ORDER — CHLORHEXIDINE GLUCONATE 0.12 % MT SOLN
15.0000 mL | Freq: Once | OROMUCOSAL | Status: AC
  Administered 2021-05-08: 15 mL via OROMUCOSAL
  Filled 2021-05-08: qty 15

## 2021-05-08 MED ORDER — MAGNESIUM CITRATE PO SOLN: 1.0000 | Freq: Once | ORAL | Status: DC | PRN

## 2021-05-08 MED ORDER — ALUM & MAG HYDROXIDE-SIMETH 200-200-20 MG/5ML PO SUSP
15.0000 mL | ORAL | Status: DC | PRN
  Filled 2021-05-08: qty 30

## 2021-05-08 MED ORDER — INSULIN PUMP
Freq: Three times a day (TID) | SUBCUTANEOUS | Status: DC
  Administered 2021-05-14: 2 via SUBCUTANEOUS
  Administered 2021-05-14: 0.5 via SUBCUTANEOUS
  Filled 2021-05-08: qty 1

## 2021-05-08 MED ORDER — PHENOL 1.4 % MT LIQD: 1.0000 | OROMUCOSAL | Status: DC | PRN

## 2021-05-08 MED ORDER — HYDROMORPHONE HCL 1 MG/ML IJ SOLN
0.5000 mg | INTRAMUSCULAR | Status: DC | PRN
  Administered 2021-05-08 – 2021-05-15 (×3): 0.5 mg via INTRAVENOUS
  Filled 2021-05-08 (×3): qty 0.5

## 2021-05-08 MED ORDER — FENTANYL CITRATE (PF) 250 MCG/5ML IJ SOLN
INTRAMUSCULAR | Status: AC
  Filled 2021-05-08: qty 5

## 2021-05-08 MED ORDER — ONDANSETRON HCL 4 MG/2ML IJ SOLN
INTRAMUSCULAR | Status: AC
  Filled 2021-05-08: qty 2

## 2021-05-08 MED ORDER — HYDROMORPHONE HCL 1 MG/ML IJ SOLN
0.2500 mg | INTRAMUSCULAR | Status: DC | PRN
  Administered 2021-05-08 (×2): 0.5 mg via INTRAVENOUS

## 2021-05-08 MED ORDER — OXYCODONE HCL 5 MG/5ML PO SOLN: 5.0000 mg | Freq: Once | ORAL | Status: DC | PRN

## 2021-05-08 MED ORDER — HYDROMORPHONE HCL 1 MG/ML IJ SOLN
INTRAMUSCULAR | Status: AC
  Filled 2021-05-08: qty 1

## 2021-05-08 MED ORDER — LIDOCAINE 2% (20 MG/ML) 5 ML SYRINGE
INTRAMUSCULAR | Status: DC | PRN
  Administered 2021-05-08: 60 mg via INTRAVENOUS

## 2021-05-08 MED ORDER — AMISULPRIDE (ANTIEMETIC) 5 MG/2ML IV SOLN: 10.0000 mg | Freq: Once | INTRAVENOUS | Status: DC | PRN

## 2021-05-08 MED ORDER — FENTANYL CITRATE (PF) 100 MCG/2ML IJ SOLN
INTRAMUSCULAR | Status: DC | PRN
  Administered 2021-05-08 (×2): 50 ug via INTRAVENOUS

## 2021-05-08 MED ORDER — LACTATED RINGERS IV SOLN: INTRAVENOUS | Status: DC

## 2021-05-08 MED ORDER — JUVEN PO PACK
1.0000 | PACK | Freq: Two times a day (BID) | ORAL | Status: DC
  Administered 2021-05-08 – 2021-05-15 (×15): 1 via ORAL
  Filled 2021-05-08 (×14): qty 1

## 2021-05-08 MED ORDER — DOCUSATE SODIUM 100 MG PO CAPS
100.0000 mg | ORAL_CAPSULE | Freq: Every day | ORAL | Status: DC
  Administered 2021-05-09 – 2021-05-15 (×7): 100 mg via ORAL
  Filled 2021-05-08 (×7): qty 1

## 2021-05-08 MED ORDER — SODIUM CHLORIDE 0.9 % IV SOLN: INTRAVENOUS | Status: DC

## 2021-05-08 MED ORDER — ONDANSETRON HCL 4 MG/2ML IJ SOLN
4.0000 mg | Freq: Four times a day (QID) | INTRAMUSCULAR | Status: DC | PRN
  Administered 2021-05-09 – 2021-05-15 (×4): 4 mg via INTRAVENOUS
  Filled 2021-05-08 (×4): qty 2

## 2021-05-08 MED ORDER — MEPERIDINE HCL 25 MG/ML IJ SOLN: 6.2500 mg | INTRAMUSCULAR | Status: DC | PRN

## 2021-05-08 MED ORDER — PANTOPRAZOLE SODIUM 40 MG PO TBEC
40.0000 mg | DELAYED_RELEASE_TABLET | Freq: Every day | ORAL | Status: DC
  Administered 2021-05-08 – 2021-05-15 (×8): 40 mg via ORAL
  Filled 2021-05-08 (×8): qty 1

## 2021-05-08 MED ORDER — PROPOFOL 10 MG/ML IV BOLUS
INTRAVENOUS | Status: DC | PRN
  Administered 2021-05-08: 50 mg via INTRAVENOUS
  Administered 2021-05-08: 150 mg via INTRAVENOUS

## 2021-05-08 MED ORDER — POLYETHYLENE GLYCOL 3350 17 G PO PACK: 17.0000 g | PACK | Freq: Every day | ORAL | Status: DC | PRN

## 2021-05-08 MED ORDER — ACETAMINOPHEN 325 MG PO TABS
325.0000 mg | ORAL_TABLET | Freq: Four times a day (QID) | ORAL | Status: DC | PRN
  Administered 2021-05-08 – 2021-05-14 (×5): 650 mg via ORAL
  Filled 2021-05-08 (×5): qty 2

## 2021-05-08 MED ORDER — LIDOCAINE HCL (PF) 2 % IJ SOLN
INTRAMUSCULAR | Status: AC
  Filled 2021-05-08: qty 5

## 2021-05-08 MED ORDER — ZINC SULFATE 220 (50 ZN) MG PO CAPS
220.0000 mg | ORAL_CAPSULE | Freq: Every day | ORAL | Status: DC
  Administered 2021-05-08 – 2021-05-15 (×8): 220 mg via ORAL
  Filled 2021-05-08 (×8): qty 1

## 2021-05-08 MED ORDER — BISACODYL 5 MG PO TBEC: 5.0000 mg | DELAYED_RELEASE_TABLET | Freq: Every day | ORAL | Status: DC | PRN

## 2021-05-08 MED ORDER — INSULIN ASPART 100 UNIT/ML IJ SOLN
0.0000 [IU] | Freq: Three times a day (TID) | INTRAMUSCULAR | Status: DC
Start: 2021-05-08 — End: 2021-05-08

## 2021-05-08 MED ORDER — CEFAZOLIN SODIUM-DEXTROSE 2-4 GM/100ML-% IV SOLN
2.0000 g | INTRAVENOUS | Status: AC
  Administered 2021-05-08: 2 g via INTRAVENOUS
  Filled 2021-05-08: qty 100

## 2021-05-08 MED ORDER — MIDAZOLAM HCL 5 MG/5ML IJ SOLN
INTRAMUSCULAR | Status: DC | PRN
  Administered 2021-05-08: 2 mg via INTRAVENOUS

## 2021-05-08 MED ORDER — ORAL CARE MOUTH RINSE: 15.0000 mL | Freq: Once | OROMUCOSAL | Status: AC

## 2021-05-08 MED ORDER — DEXAMETHASONE SODIUM PHOSPHATE 10 MG/ML IJ SOLN
INTRAMUSCULAR | Status: DC | PRN
  Administered 2021-05-08: 4 mg via INTRAVENOUS

## 2021-05-08 MED ORDER — EPHEDRINE 5 MG/ML INJ
INTRAVENOUS | Status: AC
  Filled 2021-05-08: qty 10

## 2021-05-08 SURGICAL SUPPLY — 39 items
BLADE SAW RECIP 87.9 MT (BLADE) ×3 IMPLANT
BLADE SURG 21 STRL SS (BLADE) ×3 IMPLANT
BNDG COHESIVE 6X5 TAN STRL LF (GAUZE/BANDAGES/DRESSINGS) IMPLANT
CANISTER WOUND CARE 500ML ATS (WOUND CARE) ×3 IMPLANT
COVER SURGICAL LIGHT HANDLE (MISCELLANEOUS) ×3 IMPLANT
COVER WAND RF STERILE (DRAPES) IMPLANT
CUFF TOURN SGL QUICK 34 (TOURNIQUET CUFF) ×2
CUFF TRNQT CYL 34X4.125X (TOURNIQUET CUFF) ×1 IMPLANT
DRAPE INCISE IOBAN 66X45 STRL (DRAPES) ×3 IMPLANT
DRAPE U-SHAPE 47X51 STRL (DRAPES) ×3 IMPLANT
DRESSING PREVENA PLUS CUSTOM (GAUZE/BANDAGES/DRESSINGS) ×1 IMPLANT
DRSG PREVENA PLUS CUSTOM (GAUZE/BANDAGES/DRESSINGS) ×3
DURAPREP 26ML APPLICATOR (WOUND CARE) ×3 IMPLANT
ELECT REM PT RETURN 9FT ADLT (ELECTROSURGICAL) ×3
ELECTRODE REM PT RTRN 9FT ADLT (ELECTROSURGICAL) ×1 IMPLANT
GLOVE BIOGEL PI IND STRL 9 (GLOVE) ×1 IMPLANT
GLOVE BIOGEL PI INDICATOR 9 (GLOVE) ×2
GLOVE SURG ORTHO 9.0 STRL STRW (GLOVE) ×3 IMPLANT
GOWN STRL REUS W/ TWL XL LVL3 (GOWN DISPOSABLE) ×2 IMPLANT
GOWN STRL REUS W/TWL XL LVL3 (GOWN DISPOSABLE) ×4
KIT BASIN OR (CUSTOM PROCEDURE TRAY) ×3 IMPLANT
KIT TURNOVER KIT B (KITS) ×3 IMPLANT
MANIFOLD NEPTUNE II (INSTRUMENTS) ×3 IMPLANT
NS IRRIG 1000ML POUR BTL (IV SOLUTION) ×3 IMPLANT
PACK ORTHO EXTREMITY (CUSTOM PROCEDURE TRAY) ×3 IMPLANT
PAD ARMBOARD 7.5X6 YLW CONV (MISCELLANEOUS) ×3 IMPLANT
PREVENA RESTOR ARTHOFORM 46X30 (CANNISTER) ×3 IMPLANT
PREVENA RESTOR AXIOFORM 29X28 (GAUZE/BANDAGES/DRESSINGS) ×3 IMPLANT
SPONGE LAP 18X18 RF (DISPOSABLE) ×3 IMPLANT
STAPLER VISISTAT 35W (STAPLE) ×3 IMPLANT
STOCKINETTE IMPERVIOUS LG (DRAPES) ×3 IMPLANT
SUT ETHILON 2 0 PSLX (SUTURE) ×6 IMPLANT
SUT SILK 2 0 (SUTURE) ×2
SUT SILK 2-0 18XBRD TIE 12 (SUTURE) ×1 IMPLANT
SUT VIC AB 1 CTX 27 (SUTURE) ×6 IMPLANT
TOWEL GREEN STERILE (TOWEL DISPOSABLE) ×3 IMPLANT
TUBE CONNECTING 12'X1/4 (SUCTIONS) ×1
TUBE CONNECTING 12X1/4 (SUCTIONS) ×2 IMPLANT
YANKAUER SUCT BULB TIP NO VENT (SUCTIONS) ×3 IMPLANT

## 2021-05-08 NOTE — Interval H&P Note (Signed)
History and Physical Interval Note:  05/08/2021 6:49 AM  [name]  has presented today for surgery, with the diagnosis of Collapse Right Foot.  The various methods of treatment have been discussed with the patient and family. After consideration of risks, benefits and other options for treatment, the patient has consented to  Procedure(s): RIGHT BELOW KNEE AMPUTATION (Right) as a surgical intervention.  The patient's history has been reviewed, patient examined, no change in status, stable for surgery.  I have reviewed the patient's chart and labs.  Questions were answered to the patient's satisfaction.     [name]

## 2021-05-08 NOTE — Transfer of Care (Signed)
Immediate Anesthesia Transfer of Care Note  Patient: [name]  Procedure(s) Performed: RIGHT BELOW KNEE AMPUTATION (Right: Knee)  Patient Location: PACU  Anesthesia Type:General  Level of Consciousness: awake, alert , oriented and patient cooperative  Airway & Oxygen Therapy: Patient Spontanous Breathing  Post-op Assessment: Report given to RN, Post -op Vital signs reviewed and stable and Patient moving all extremities X 4  Post vital signs: Reviewed and stable  Last Vitals:  Vitals Value Taken Time  BP    Temp    Pulse    Resp    SpO2      Last Pain:  Vitals:   05/08/21 0638  TempSrc:   PainSc: 0-No pain      Patients Stated Pain Goal: 5 (05/08/21 2992)  Complications: No notable events documented.

## 2021-05-08 NOTE — Progress Notes (Addendum)
Inpatient Diabetes Program Recommendations  AACE/ADA: New Consensus Statement on Inpatient Glycemic Control   Target Ranges:  Prepandial:   less than 140 mg/dL      Peak postprandial:   less than 180 mg/dL (1-2 hours)      Critically ill patients:  140 - 180 mg/dL  Results for [name], [name] (MRN [id number]) as of 05/08/2021 11:06  Ref. Range 05/08/2021 05:47 05/08/2021 08:24  Glucose-Capillary Latest Ref Range: 70 - 99 mg/dL 93 355 (H)    Review of Glycemic Control  Diabetes history: DM1 Outpatient Diabetes medications: Insulin Pump with Humalog Current orders for Inpatient glycemic control: Novolog 0-9 units TID with meals  Inpatient Diabetes Program Recommendations:    Insulin: Please discontinue Novolog correction scale and order Insulin Pump order set with CBGs ACHS&2am and insulin pump frequency ACHS&2am.  NOTE: Noted consult for diabetes coordinator. Chart reviewed. Per chart, patient has Type 1 DM and uses an insulin pump for DM control. Patient sees Dr. Sharl [name] (Endocrinologist). Will plan to speak with patient today to obtain information regarding insulin pump settings and DM control.  Addendum 05/08/21@13 :55-Spoke with patient and her husband at bedside regarding diabetes and home regimen for diabetes management.  Patient states that she has DM1 and is followed by Dr. Sharl [name] for diabetes management. Patient uses a T-Slim insulin pump as an outpatient. Patient has insulin pump on and attached at this time.    Current insulin pump settings are as follows:  Basal insulin  12A 1.38 units/hour 4P 1.9 units/hour Total daily basal insulin: 37.28 units/24 hours  Carb Coverage 1:4 1 unit for every 4 grams of carbohydrates  Insulin Sensitivity 1:40 1 unit drops blood glucose 40 mg/dl  Target Glucose Goals 73U 110 mg/dl  In talking with the patient she states that her last A1C was 6.4%.  Patient's husband also has ability to see patient's Dexcom CGM on his personal cell phone  and actually has picture of her pump settings. In talking with them both, patient's husband is a great support for her and is able to assist patient with insulin pump infusion site changes as well as put on new Dexcom sensors if needed.  Informed patient that she received Decadron 4 mg at 7:42 am today which is contributing to hyperglycemia. Patient notes that she is using the Control IQ mode (so it automatically makes adjustments to insulin pump settings).  Patient verbalized understanding of information discussed and states that she does not have any further questions related to diabetes at this time.  NURSING: If not already completed,  please print off the Patient insulin pump contract and flow sheet. The insulin pump contract should be signed by the patient and then placed in the chart. The patient insulin pump flow sheet will be completed by the patient at the bedside and the RN caring for the patient will use the patient's flow sheet to document in the [facility]. RN will need to complete the Nursing Insulin Pump Flowsheet at least once a shift. Patient will need to keep extra insulin pump supplies at the bedside at all times.    Thanks, Orlando Penner, RN, MSN, CDE Diabetes Coordinator Inpatient Diabetes Program [phone] (Team Pager from 8am to 5pm)

## 2021-05-08 NOTE — Anesthesia Postprocedure Evaluation (Signed)
Anesthesia Post Note  Patient: [name]  Procedure(s) Performed: RIGHT BELOW KNEE AMPUTATION (Right: Knee)     Patient location during evaluation: PACU Anesthesia Type: General Level of consciousness: awake and alert Pain management: pain level controlled Vital Signs Assessment: post-procedure vital signs reviewed and stable Respiratory status: spontaneous breathing, nonlabored ventilation and respiratory function stable Cardiovascular status: blood pressure returned to baseline and stable Postop Assessment: no apparent nausea or vomiting Anesthetic complications: no   No notable events documented.  Last Vitals:  Vitals:   05/08/21 0908 05/08/21 0923  BP: (!) 144/77 (!) 145/59  Pulse: 76 76  Resp: 17 13  Temp:    SpO2: 100% 98%    Last Pain:  Vitals:   05/08/21 0853  TempSrc:   PainSc: 8                  Lowella Curb

## 2021-05-08 NOTE — Anesthesia Preprocedure Evaluation (Signed)
Anesthesia Evaluation  Patient identified by MRN, date of birth, ID band Patient awake    Reviewed: Allergy & Precautions, NPO status , Patient's Chart, lab work & pertinent test results  Airway Mallampati: II  TM Distance: >3 FB Neck ROM: Full    Dental no notable dental hx.    Pulmonary sleep apnea ,    Pulmonary exam normal breath sounds clear to auscultation       Cardiovascular negative cardio ROS Normal cardiovascular exam Rhythm:Regular Rate:Normal     Neuro/Psych  Headaches, Anxiety Depression negative psych ROS   GI/Hepatic Neg liver ROS, GERD  ,  Endo/Other  diabetes  Renal/GU negative Renal ROS  negative genitourinary   Musculoskeletal  (+) Arthritis , Osteoarthritis,    Abdominal   Peds negative pediatric ROS (+)  Hematology negative hematology ROS (+)   Anesthesia Other Findings   Reproductive/Obstetrics negative OB ROS                             Anesthesia Physical  Anesthesia Plan  ASA: III  Anesthesia Plan: General   Post-op Pain Management:  Regional for Post-op pain   Induction: Intravenous  PONV Risk Score and Plan: 3 and Ondansetron, Dexamethasone, Midazolam and Treatment may vary due to age or medical condition  Airway Management Planned: LMA  Additional Equipment:   Intra-op Plan:   Post-operative Plan: Extubation in OR  Informed Consent: I have reviewed the patients History and Physical, chart, labs and discussed the procedure including the risks, benefits and alternatives for the proposed anesthesia with the patient or authorized representative who has indicated his/her understanding and acceptance.     Dental advisory given  Plan Discussed with: CRNA and Surgeon  Anesthesia Plan Comments:         Anesthesia Quick Evaluation

## 2021-05-08 NOTE — H&P (Signed)
[name] is an 60 y.o. female.   Chief Complaint: Right foot Deformity HPI:   HPI: Patient is a 60 year old woman who was seen for initial evaluation for right foot deformity.  Patient has a contraction with an ankle deformity of 90 degrees.  Patient has attempted conservative management with immobilization fracture boot nonweightbearing in a wheelchair.  Patient states that due to the persistent deformity she is unable to perform her activities of daily living.  Her most recent hemoglobin A1c is 7.8.  Patient is currently wearing a ankle-foot arthrosis on the left with a plantigrade foot on the left.   Patient states that due to the deformity of the right foot she has been in bed since last July essentially 1 year.  She has a dropfoot on the left does not have motor function in the right.  Patient states the deformity started with a infection where she was treated at the wound center clinic.  Past Medical History:  Diagnosis Date   Adhesive capsulitis 2013   Bilateral shoulders   Adhesive capsulitis of both shoulders 2013   Anemia    Cataract    Chronic kidney disease    Depression    Diabetes mellitus without complication (HCC)    Diabetic retinopathy (HCC) 2010   Diabetic retinopathy (HCC) 2010   Left eye, mild   Essential tremor 2011   Bilateral hands   Foot drop, bilateral 2007   Foot fracture, left    Gastroparesis 2009   GERD (gastroesophageal reflux disease)    Headache    Hemorrhage 2011   Laser surgery x2   High potassium 2012   Normal as of 2014   Hypercholesterolemia 2010   Hyperlipidemia    Neuromuscular disorder (HCC)    patient states that she has a body twitch.   Pneumonia    Postnasal drip 2010   Scratched cornea 2013   Left eye   Sleep apnea    does not wear CPAP   Stage 3 chronic kidney disease (HCC)    Thyroid disease    TMJ (temporomandibular joint disorder)    Type 1 diabetes (HCC)     Past Surgical History:  Procedure Laterality Date    ABDOMINAL HYSTERECTOMY  2007   APPENDECTOMY     BALLOON DILATION N/A 01/24/2017   Procedure: BALLOON DILATION;  Surgeon: Charlott Rakes, MD;  Location: [facility];  Service: Endoscopy;  Laterality: N/A;   CARPAL TUNNEL RELEASE Right 1990   CATARACT EXTRACTION Bilateral 2008   COLONOSCOPY  2007   COLONOSCOPY WITH PROPOFOL N/A 01/24/2017   Procedure: COLONOSCOPY WITH PROPOFOL;  Surgeon: Charlott Rakes, MD;  Location: [facility];  Service: Endoscopy;  Laterality: N/A;   ESOPHAGOGASTRODUODENOSCOPY (EGD) WITH PROPOFOL N/A 01/24/2017   Procedure: ESOPHAGOGASTRODUODENOSCOPY (EGD) WITH PROPOFOL;  Surgeon: Charlott Rakes, MD;  Location: [facility];  Service: Endoscopy;  Laterality: N/A;  moved oer MD request   EYE SURGERY Left 2012   Pars plana viterctomy, membrane peel, endolaser   ROOT CANAL  2007   ROOT CANAL  2010   Thorat dilation  2007   Throat dilation  2007   Throat dilation  2010, 0ct 2011   Sep 2010, Nov 2010, 2014   TONSILLECTOMY  1971   Trigger fingers  2008   WISDOM TOOTH EXTRACTION  1977    Family History  Problem Relation Age of Onset   Hypertension Mother    Diabetes Father    COPD Father    Pneumonia Father  Stroke Maternal Grandmother    Hypertension Maternal Grandmother    Diabetes Maternal Grandfather    Parkinson's disease Maternal Grandfather    Heart disease Maternal Grandfather    Parkinsonism Neg Hx    Neuropathy Neg Hx    Breast cancer Neg Hx    Social History:  reports that she has never smoked. She has never used smokeless tobacco. She reports current drug use. She reports that she does not drink alcohol.  Allergies:  Allergies  Allergen Reactions   Ceftriaxone     Other reaction(s): diarrhea   Codeine Other (See Comments)    Told to keep on the list    Food     MSG and Lactose (cannot have any dairy products)    Milk-Related Compounds     Other reaction(s): diarrhea   Monosodium Glutamate     Other reaction(s): diarrhea   Other  Nausea And Vomiting    MSG and Lactose Intolerant Other reaction(s): diarrhea Other reaction(s): hives Other reaction(s): pass out   Propranolol Hcl     Other reaction(s): fainting   Soybean-Containing Drug Products Diarrhea    Medications Prior to Admission  Medication Sig Dispense Refill   acetaminophen (TYLENOL) 500 MG tablet Take 500-1,000 mg by mouth every 6 (six) hours as needed for headache, moderate pain or mild pain.     albuterol (PROVENTIL HFA;VENTOLIN HFA) 108 (90 BASE) MCG/ACT inhaler Inhale 1 puff into the lungs every 4 (four) hours as needed for wheezing or shortness of breath.     ammonium lactate (AMLACTIN) 12 % lotion Apply 1 application topically as needed for dry skin. (Patient taking differently: Apply 1 application topically daily as needed for dry skin.) 400 g 0   aspirin 81 MG chewable tablet Chew 81 mg by mouth daily.     Carboxymethylcellul-Glycerin (REFRESH OPTIVE OP) Place 1 drop into both eyes daily as needed (dry eyes).     cycloSPORINE (RESTASIS) 0.05 % ophthalmic emulsion Place 1 drop into both eyes 2 (two) times daily.      diphenhydrAMINE (BENADRYL) 25 MG tablet Take 25 mg by mouth 2 (two) times daily.     DULERA 100-5 MCG/ACT AERO Inhale 2 puffs into the lungs 2 (two) times daily.     escitalopram (LEXAPRO) 10 MG tablet Take 10 mg by mouth at bedtime.     estradiol (ESTRACE) 2 MG tablet Take 2 mg by mouth daily.     famotidine (PEPCID) 10 MG tablet Take 10 mg by mouth 2 (two) times daily.     gabapentin (NEURONTIN) 300 MG capsule Take 1 capsule (300 mg total) by mouth 3 (three) times daily. (Patient taking differently: Take 900 mg by mouth at bedtime.) 180 capsule 0   hydrocortisone (ANUSOL-HC) 2.5 % rectal cream Place 1 application rectally 2 (two) times daily as needed for hemorrhoids or anal itching.     hydrocortisone 2.5 % cream Apply 1 application topically daily as needed (itching).      Insulin Human (INSULIN PUMP) SOLN Inject 1 each into the skin  See admin instructions. Insulin pump MEDTRONIC with humalog (Will be changing to TANDEM).Works with DEXCOM 4 CGM     insulin lispro (HUMALOG) 100 UNIT/ML injection Has with insulin pump     lamoTRIgine (LAMICTAL) 150 MG tablet TAKE 1 TABLET BY MOUTH TWICE DAILY (Patient taking differently: Take 150 mg by mouth 2 (two) times daily.) 180 tablet 3   lansoprazole (PREVACID) 30 MG capsule Take 60 mg by mouth 2 (two) times daily  before a meal.     levocetirizine (XYZAL) 5 MG tablet Take 5 mg by mouth 2 (two) times daily.      levothyroxine (SYNTHROID, LEVOTHROID) 112 MCG tablet Take 112 mcg by mouth daily.     loperamide (IMODIUM A-D) 2 MG tablet Take 2 mg by mouth 4 (four) times daily as needed for diarrhea or loose stools.     mometasone (NASONEX) 50 MCG/ACT nasal spray Place 2 sprays into the nose daily. OTC     montelukast (SINGULAIR) 10 MG tablet Take 10 mg by mouth at bedtime.     Multiple Vitamins-Minerals (MULTIVITAMIN GUMMIES ADULT PO) Take 2 each by mouth daily.      Pancrelipase, Lip-Prot-Amyl, (PANCREAZE PO) Take 3-4 capsules by mouth daily as needed (diarrhea). Take 4 capsules with a meal and 3 capsules with a snack     primidone (MYSOLINE) 50 MG tablet TAKE 1 TABLET BY MOUTH IN THE MORNING , 1 TABLET AT NOON AND ONE AND ONE-HALF (1 & 1/2) TABLETS IN THE EVENING (Patient taking differently: Take 50 mg by mouth 3 (three) times daily.) 315 tablet 3   rosuvastatin (CRESTOR) 40 MG tablet Take 40 mg by mouth every evening.      Simethicone (GAS-X EXTRA STRENGTH PO) Take 1 tablet by mouth daily as needed (gas relief).     Continuous Blood Gluc Sensor (DEXCOM G6 SENSOR) MISC SMARTSIG:1 Each Topical Every 10 Days     Continuous Blood Gluc Transmit (DEXCOM G6 TRANSMITTER) MISC       Results for orders placed or performed during the hospital encounter of 05/08/21 (from the past 48 hour(s))  Glucose, capillary     Status: None   Collection Time: 05/08/21  5:47 AM  Result Value Ref Range    Glucose-Capillary 93 70 - 99 mg/dL    Comment: Glucose reference range applies only to samples taken after fasting for at least 8 hours.   No results found.  Review of Systems  All other systems reviewed and are negative.  Blood pressure (!) 149/52, pulse 67, temperature 98.5 F (36.9 C), temperature source Oral, resp. rate 17, height 5\' 5"  (1.651 m), weight 79.4 kg, SpO2 95 %. Physical Exam  Patient is alert, oriented, no adenopathy, well-dressed, normal affect, normal respiratory effort. Examination patient does not have a palpable pulse she has a fixed deformity with the ankle varus of 90 degrees with essentially the lateral aspect of her foot being plantigrade on the floor.  This is a fixed deformity that is not correctable.  Patient's leg is thin and atrophic.  No ascending cellulitis she does have a history of infection.  The left foot is plantigrade wearing a posterior ankle-foot orthosis from [employer].  Most recent hemoglobin A1c 2 weeks ago was 6.4.Heart RRR Lungs Clear Assessment/Plan 1. Pain in right ankle and joints of right foot  2. Acquired ankle deformity, right       Plan: Due to the progressive fixed deformity of the ankle with history of infection discussed surgical options would be tibial calcaneal fusion versus amputation.  Patient states that she has discussed this at length and would like to proceed with the amputation.  We will set this up at her convenience.    Alayna Mabe Kilo Eshelman, PA 05/08/2021, 6:29 AM

## 2021-05-08 NOTE — Plan of Care (Signed)
  Problem: Education: Goal: Knowledge of General Education information will improve Description Including pain rating scale, medication(s)/side effects and non-pharmacologic comfort measures Outcome: Progressing   

## 2021-05-08 NOTE — Op Note (Signed)
   Date of Surgery: 05/08/2021  INDICATIONS: [name] is a 60 y.o.-year-old female who has unstable charcot collapse right ankle.  PREOPERATIVE DIAGNOSIS: charcot collapse right ankle  POSTOPERATIVE DIAGNOSIS: Same.  PROCEDURE: Transtibial amputation right Application of Prevena wound VAC  SURGEON: Lajoyce Corners, M.D.  ANESTHESIA:  general  IV FLUIDS AND URINE: See anesthesia records.  ESTIMATED BLOOD LOSS: See anesthesia records.  COMPLICATIONS: None.  DESCRIPTION OF PROCEDURE: The patient was brought to the operating room after undergoing regional anesthetic. After adequate levels of anesthesia were obtained patient's lower extremity was prepped using DuraPrep draped into a sterile field. A timeout was called. The foot was draped out of the sterile field with impervious stockinette. A transverse incision was made 11 cm distal to the tibial tubercle. This curved proximally and a large posterior flap was created. The tibia was transected 1 cm proximal to the skin incision. The fibula was transected just proximal to the tibial incision. The tibia was beveled anteriorly. A large posterior flap was created. The sciatic nerve was pulled cut and allowed to retract. The vascular bundles were suture ligated with 2-0 silk. The deep and superficial fascial layers were closed using #1 Vicryl. The skin was closed using staples and 2-0 nylon. The wound was covered with a Prevena customizable and arthroform wound VAC.  The dressing was sealed with dermatac there was a good suction fit. A prosthetic shrinker will be applied in patient's room. Patient was taken to the PACU in stable condition.   DISCHARGE PLANNING:  Antibiotic duration: 24 hours  Weightbearing: Nonweightbearing on the operative extremity  Pain medication: Opioid pathway  Dressing care/ Wound VAC: Continue wound VAC for 1 week after discharge  Discharge to: Discharge planning based on therapy's recommendations for possible inpatient  rehabilitation, outpatient rehabilitation, or discharge to home with therapy  Follow-up: In the office 1 week post operative.  Aldean Baker, MD [facility] 8:42 AM

## 2021-05-08 NOTE — Anesthesia Procedure Notes (Signed)
Procedure Name: LMA Insertion Date/Time: 05/08/2021 7:35 AM Performed by: Pearson Grippe, CRNA Pre-anesthesia Checklist: Patient identified, Emergency Drugs available, Suction available and Patient being monitored Patient Re-evaluated:Patient Re-evaluated prior to induction Oxygen Delivery Method: Circle system utilized Preoxygenation: Pre-oxygenation with 100% oxygen Induction Type: IV induction Ventilation: Mask ventilation without difficulty LMA: LMA inserted LMA Size: 4.0 Number of attempts: 1 Airway Equipment and Method: Bite block Placement Confirmation: positive ETCO2 Tube secured with: Tape Dental Injury: Teeth and Oropharynx as per pre-operative assessment

## 2021-05-08 NOTE — Progress Notes (Signed)
Orthopedic Tech Progress Note Patient Details:  [name] July 15, 1961 [id number] Called in order to [facility] Patient ID: [name], female   DOB: November 24, 1961, 59 y.o.   MRN: [id number]  [name] 05/08/2021, 11:06 AM

## 2021-05-09 ENCOUNTER — Encounter (HOSPITAL_COMMUNITY): Payer: Self-pay | Admitting: Orthopedic Surgery

## 2021-05-09 LAB — GLUCOSE, CAPILLARY
Glucose-Capillary: 171 mg/dL — ABNORMAL HIGH (ref 70–99)
Glucose-Capillary: 152 mg/dL — ABNORMAL HIGH (ref 70–99)
Glucose-Capillary: 212 mg/dL — ABNORMAL HIGH (ref 70–99)
Glucose-Capillary: 137 mg/dL — ABNORMAL HIGH (ref 70–99)

## 2021-05-09 LAB — CBC
HCT: 37 % (ref 36.0–46.0)
Hemoglobin: 11.5 g/dL — ABNORMAL LOW (ref 12.0–15.0)
MCH: 30.6 pg (ref 26.0–34.0)
MCHC: 31.1 g/dL (ref 30.0–36.0)
MCV: 98.4 fL (ref 80.0–100.0)
Platelets: 243 10*3/uL (ref 150–400)
RBC: 3.76 MIL/uL — ABNORMAL LOW (ref 3.87–5.11)
RDW: 13.2 % (ref 11.5–15.5)
WBC: 13.3 10*3/uL — ABNORMAL HIGH (ref 4.0–10.5)
nRBC: 0 % (ref 0.0–0.2)

## 2021-05-09 LAB — BASIC METABOLIC PANEL
Anion gap: 9 (ref 5–15)
BUN: 16 mg/dL (ref 6–20)
CO2: 31 mmol/L (ref 22–32)
Calcium: 8.8 mg/dL — ABNORMAL LOW (ref 8.9–10.3)
Chloride: 99 mmol/L (ref 98–111)
Creatinine, Ser: 1.03 mg/dL — ABNORMAL HIGH (ref 0.44–1.00)
GFR, Estimated: >60 mL/min (ref >60)
Glucose, Bld: 151 mg/dL — ABNORMAL HIGH (ref 70–99)
Potassium: 4.2 mmol/L (ref 3.5–5.1)
Sodium: 139 mmol/L (ref 135–145)

## 2021-05-09 MED ORDER — GABAPENTIN 300 MG PO CAPS
900.0000 mg | ORAL_CAPSULE | Freq: Every day | ORAL | Status: DC
  Administered 2021-05-09 – 2021-05-14 (×6): 900 mg via ORAL
  Filled 2021-05-09 (×6): qty 3

## 2021-05-09 MED ORDER — PRIMIDONE 50 MG PO TABS
50.0000 mg | ORAL_TABLET | Freq: Three times a day (TID) | ORAL | Status: DC
  Administered 2021-05-09 – 2021-05-15 (×20): 50 mg via ORAL
  Filled 2021-05-09 (×21): qty 1

## 2021-05-09 MED ORDER — ESCITALOPRAM OXALATE 10 MG PO TABS
10.0000 mg | ORAL_TABLET | Freq: Every day | ORAL | Status: DC
  Administered 2021-05-09 – 2021-05-14 (×6): 10 mg via ORAL
  Filled 2021-05-09 (×6): qty 1

## 2021-05-09 NOTE — Progress Notes (Signed)
Inpatient Rehab Admissions Coordinator:    CIR consult received. Pt. Post op AKA, has not been seen by PT/OT yet. I will wait for  notes with recommendations from therapy before determining candidacy for CIR.  Megan Salon, MS, CCC-SLP Rehab Admissions Coordinator  [phone] (celll) [phone] (office)

## 2021-05-09 NOTE — Progress Notes (Addendum)
OT Cancellation Note  Patient Details Name: ROCQUEL ASKREN MRN: [id number] DOB: [birth year]   Cancelled Treatment:    Reason Eval/Treat Not Completed: Other (comment) (Just finished with PT. Nauseous, vomiting, and low BP during PT session.) Will return as schedule allows.  Ricard Faulkner M Farhan Jean Zimir Kittleson MSOT, OTR/L Acute Rehab Pager: [phone] Office: [phone] 05/09/2021, 9:09 AM

## 2021-05-09 NOTE — Evaluation (Signed)
Physical Therapy Evaluation Patient Details Name: [name] MRN: [id number] DOB: [birth year] Today's Date: 05/09/2021   History of Present Illness  Pt is a 60 y.o. 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 PMH consists of DM1, peripheral neuropathy, gastroparesis, and retinopathy.   Clinical Impression  Pt admitted with above diagnosis. PTA pt lived at home with family. She was primarily bed and wheelchair bound, mod I transfers. On eval, she required min assist bed mobility. Unable to progress beyond EOB due to N&V. Pt currently with functional limitations due to the deficits listed below (see PT Problem List). Pt will benefit from skilled PT to increase their independence and safety with mobility to allow discharge to the venue listed below.  Suspect pt will progress quickly with mobility after N&V and orthostatic hypotension are managed.     Follow Up Recommendations Home health PT;Supervision/Assistance - 24 hour    Equipment Recommendations  None recommended by PT    Recommendations for Other Services       Precautions / Restrictions Precautions Precautions: Fall;Other (comment) Precaution Comments: wound vac Required Braces or Orthoses: Other Brace Other Brace: R shrinker and limb protector Restrictions Weight Bearing Restrictions: Yes RLE Weight Bearing: Non weight bearing      Mobility  Bed Mobility Overal bed mobility: Needs Assistance Bed Mobility: Supine to Sit;Sit to Supine     Supine to sit: Min assist;HOB elevated Sit to supine: Min assist;HOB elevated   General bed mobility comments: +rail, cues for sequencing, assist to scoot to EOB    Transfers                 General transfer comment: Unable to progress beyond EOB due to dizziness, nausea, and vommiting.  Ambulation/Gait                Stairs            Wheelchair Mobility    Modified Rankin (Stroke Patients Only)       Balance Overall  balance assessment: Needs assistance Sitting-balance support: Feet supported;No upper extremity supported Sitting balance-Leahy Scale: Good                                       Pertinent Vitals/Pain Pain Assessment: Faces Faces Pain Scale: Hurts little more Pain Location: R residual limb Pain Descriptors / Indicators: Grimacing;Guarding;Sore Pain Intervention(s): Monitored during session;Repositioned;Limited activity within patient's tolerance    Home Living Family/patient expects to be discharged to:: Private residence Living Arrangements: Spouse/significant other;Children Available Help at Discharge: Family;Available PRN/intermittently Type of Home: House Home Access: Other (comment);Level entry (stair lift to basement)     Home Layout: One level;Laundry or work area in [employer] Equipment: Environmental consultant - 2 wheels;Walker - 4 wheels;Bedside commode;Electric scooter;Wheelchair - manual Additional Comments: husband and dtr work 5am to [employer]. husband wakes pt at 4a to take meds. she goes back to sleep and gets up around 11a, pt only transfers to [facility] until her family returns. pt reports more limited mobility x 1 year.    Prior Function Level of Independence: Needs assistance   Gait / Transfers Assistance Needed: transfers mod I at baseline, wheelchair or electric scooter for increased distances           Hand Dominance        Extremity/Trunk Assessment   Upper Extremity Assessment Upper Extremity Assessment: Defer to OT evaluation  Lower Extremity Assessment Lower Extremity Assessment: RLE deficits/detail;LLE deficits/detail RLE Deficits / Details: s/p BKA RLE Sensation: history of peripheral neuropathy LLE Sensation: history of peripheral neuropathy       Communication   Communication: No difficulties  Cognition Arousal/Alertness: Awake/alert Behavior During Therapy: WFL for tasks assessed/performed Overall Cognitive Status: Within Functional  Limits for tasks assessed                                        General Comments General comments (skin integrity, edema, etc.): Pt became lightheaded with c/o dizziness and nausea upon sitting EOB. Pt then began vommiting. Transitioned back to supine in bed. BP supine 91/62. Pt reports h/o orthostatic hypotension.    Exercises Amputee Exercises Quad Sets: AROM;Right;5 reps Knee Flexion: AROM;Right;5 reps   Assessment/Plan    PT Assessment Patient needs continued PT services  PT Problem List Decreased strength;Decreased range of motion;Decreased mobility;Decreased activity tolerance;Decreased balance;Pain;Decreased knowledge of precautions       PT Treatment Interventions Therapeutic activities;Therapeutic exercise;Gait training;Patient/family education;Balance training;Functional mobility training    PT Goals (Current goals can be found in the Care Plan section)  Acute Rehab PT Goals Patient Stated Goal: home PT Goal Formulation: With patient Time For Goal Achievement: 05/23/21 Potential to Achieve Goals: Good    Frequency Min 3X/week   Barriers to discharge        Co-evaluation               AM-PAC PT "6 Clicks" Mobility  Outcome Measure Help needed turning from your back to your side while in a flat bed without using bedrails?: None Help needed moving from lying on your back to sitting on the side of a flat bed without using bedrails?: A Little Help needed moving to and from a bed to a chair (including a wheelchair)?: A Little Help needed standing up from a chair using your arms (e.g., wheelchair or bedside chair)?: A Little Help needed to walk in hospital room?: A Lot Help needed climbing 3-5 steps with a railing? : Total 6 Click Score: 16    End of Session Equipment Utilized During Treatment: Gait belt Activity Tolerance: Treatment limited secondary to medical complications (Comment) (N&V) Patient left: in bed;with call bell/phone within  reach Nurse Communication: Mobility status PT Visit Diagnosis: Other abnormalities of gait and mobility (R26.89);Pain Pain - Right/Left: Right Pain - part of body: Leg    Time: 0829-0908 PT Time Calculation (min) (ACUTE ONLY): 39 min   Charges:   PT Evaluation $PT Eval Moderate Complexity: 1 Mod PT Treatments $Therapeutic Activity: 23-37 mins        Aida Raider, PT  Office # [phone] Pager [phone]   Ilda Foil 05/09/2021, 11:17 AM

## 2021-05-09 NOTE — Evaluation (Signed)
Occupational Therapy Evaluation Patient Details Name: [name] MRN: [id number] DOB: [birth year] Today's Date: 05/09/2021    History of Present Illness 60 y.o. 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 PMH consists of DM1, peripheral neuropathy, gastroparesis, and retinopathy.   Clinical Impression   PTA, pt was living with her husband and daughter and was performing BADLs and using w/c; husband assisting as needed. Pt currently requiring Mod-Max A for LB ADLs and functional transfers. Pt presenting with decreased balance, strength, awareness, and activity tolerance. During pivot to recliner, pt with dizziness. BP 80/60s. Retaking BP after elevating BLEs and performing AROM of BUE; BP maintaining 80/60 - pt denies further dizziness. Notified RN. Pt would benefit from further acute OT to facilitate safe dc. Recommend dc to home with HHOT for further OT to optimize safety, independence with ADLs, and return to PLOF.     Follow Up Recommendations  Home health OT;Supervision/Assistance - 24 hour    Equipment Recommendations  None recommended by OT    Recommendations for Other Services PT consult     Precautions / Restrictions Precautions Precautions: Fall Precaution Comments: wound vac Required Braces or Orthoses: Other Brace Other Brace: R shrinker and limb protector Restrictions Weight Bearing Restrictions: Yes RLE Weight Bearing: Non weight bearing      Mobility Bed Mobility Overal bed mobility: Needs Assistance Bed Mobility: Supine to Sit     Supine to sit: Min guard;HOB elevated Sit to supine: Min assist;HOB elevated   General bed mobility comments: Min Guard A for safety    Transfers Overall transfer level: Needs assistance Equipment used: Rolling walker (2 wheeled) Transfers: Sit to/from [employer] Sit to Stand: Mod assist;+2 safety/equipment Stand pivot transfers: Mod assist;Max assist;+2 safety/equipment        General transfer comment: Mod A for power up and then initate pivot to recliner. Pt with fatigue and requiring Max A for bringing hips towards recliner    Balance Overall balance assessment: Needs assistance Sitting-balance support: Feet supported;No upper extremity supported Sitting balance-Leahy Scale: Good     Standing balance support: Bilateral upper extremity supported;During functional activity Standing balance-Leahy Scale: Poor Standing balance comment: reliant on UE support                           ADL either performed or assessed with clinical judgement   ADL Overall ADL's : Needs assistance/impaired Eating/Feeding: Set up;Sitting   Grooming: Set up;Sitting   Upper Body Bathing: Supervision/ safety;Set up;Sitting   Lower Body Bathing: Moderate assistance;Sit to/from stand;Sitting/lateral leans   Upper Body Dressing : Supervision/safety;Set up;Sitting   Lower Body Dressing: Maximal assistance;Sit to/from stand;Sitting/lateral leans   Toilet Transfer: Moderate assistance;Maximal assistance;+2 for safety/equipment;Stand-pivot;RW Toilet Transfer Details (indicate cue type and reason): Simulated to recliner. Mod A for power up and then initate pivot to recliner. Pt with fatigue and requiring Max A for bringing hips towards recliner         Functional mobility during ADLs: Moderate assistance;Maximal assistance;+2 for safety/equipment;Rolling walker (stand pivot to recliner) General ADL Comments: Pt presenting with decreased balance, strength, and activity tolerance. Hypotensive with trasnfer; see general comments for BPs     Vision Baseline Vision/History: Wears glasses Wears Glasses: At all times Patient Visual Report: No change from baseline       Perception     Praxis      Pertinent Vitals/Pain Pain Assessment: Faces Faces Pain Scale: Hurts little more Pain  Location: R residual limb Pain Descriptors / Indicators: Grimacing;Guarding;Sore Pain  Intervention(s): Monitored during session;Limited activity within patient's tolerance;Repositioned     Hand Dominance     Extremity/Trunk Assessment Upper Extremity Assessment Upper Extremity Assessment: Generalized weakness   Lower Extremity Assessment Lower Extremity Assessment: Defer to PT evaluation RLE Deficits / Details: s/p BKA RLE Sensation: history of peripheral neuropathy LLE Deficits / Details: Drop foot LLE Sensation: history of peripheral neuropathy       Communication Communication Communication: No difficulties   Cognition Arousal/Alertness: Awake/alert Behavior During Therapy: WFL for tasks assessed/performed Overall Cognitive Status: Within Functional Limits for tasks assessed                                 General Comments: Eager to return home and participate in therapy. Pt reporting she feels slightly "foggy" and noting she presents with decreased attention and awareness. Easily internally distracted. Feel possibly due to medications   General Comments  Pt reporting dizziness during pivot to recliner. Sitting in recliner (legs down), BP 80/60s (60s). Recliner with BLE elevated, BP 85/54 (64). After 5 mins with AROM of BUE/BLE, BP 83/84 (64)    Exercises Exercises: General Upper Extremity General Exercises - Upper Extremity Shoulder Flexion: AROM;Both;20 reps;Seated Elbow Flexion: AROM;Both;20 reps;Seated Amputee Exercises Quad Sets: AROM;Right;5 reps Knee Flexion: AROM;Right;5 reps   Shoulder Instructions      Home Living Family/patient expects to be discharged to:: Private residence Living Arrangements: Spouse/significant other;Children Available Help at Discharge: Family;Available PRN/intermittently Type of Home: House Home Access: Other (comment);Level entry (Stair lift from baseline ("drive in baseline")     Home Layout: One level;Laundry or work area in basement     [employer] Shower/Tub: [employer];Tub/shower unit    Bathroom Toilet: Standard (BSC over)     Home Equipment: Walker - 2 wheels;Walker - 4 wheels;Bedside commode;Electric scooter;Wheelchair - manual;Tub bench   Additional Comments: husband and dtr work 5am to [employer]. husband wakes pt at 4a to take meds. she goes back to sleep and gets up around 11a, pt only transfers to [facility] until her family returns. pt reports more limited mobility x 1 year.      Prior Functioning/Environment Level of Independence: Needs assistance  Gait / Transfers Assistance Needed: transfers mod I at baseline, wheelchair or electric scooter for increased distances ADL's / Homemaking Assistance Needed: Husband assist with in/out shower. She performs BADLs            OT Problem List: Decreased strength;Decreased activity tolerance;Impaired balance (sitting and/or standing);Decreased safety awareness;Decreased knowledge of use of DME or AE;Decreased knowledge of precautions;Pain      OT Treatment/Interventions: Self-care/ADL training;Therapeutic exercise;Energy conservation;DME and/or AE instruction;Therapeutic activities;Patient/family education    OT Goals(Current goals can be found in the care plan section) Acute Rehab OT Goals Patient Stated Goal: home OT Goal Formulation: With patient Time For Goal Achievement: 05/23/21 Potential to Achieve Goals: Good  OT Frequency: Min 2X/week   Barriers to D/C:            Co-evaluation              AM-PAC OT "6 Clicks" Daily Activity     Outcome Measure Help from another person eating meals?: A Little Help from another person taking care of personal grooming?: A Little Help from another person toileting, which includes using toliet, bedpan, or urinal?: A Lot Help from another person bathing (including washing, rinsing, drying)?: A Lot  Help from another person to put on and taking off regular upper body clothing?: A Little Help from another person to put on and taking off regular lower body clothing?: A Lot 6  Click Score: 15   End of Session Equipment Utilized During Treatment: Gait belt;Rolling walker Nurse Communication: Mobility status  Activity Tolerance: Patient tolerated treatment well Patient left: in chair;with call bell/phone within reach  OT Visit Diagnosis: Unsteadiness on feet (R26.81);Other abnormalities of gait and mobility (R26.89);Muscle weakness (generalized) (M62.81);History of falling (Z91.81);Pain Pain - Right/Left: Right Pain - part of body: Leg                Time: 1127-1205 OT Time Calculation (min): 38 min Charges:  OT General Charges $OT Visit: 1 Visit OT Evaluation $OT Eval Moderate Complexity: 1 Mod OT Treatments $Self Care/Home Management : 23-37 mins  Amear Strojny MSOT, OTR/L Acute Rehab Pager: [phone] Office: [phone]  Theodoro Grist Jahna Liebert 05/09/2021, 1:24 PM

## 2021-05-09 NOTE — Progress Notes (Signed)
Inpatient Rehab Admissions Coordinator:    Pt. Seen by both PT and OT with recommendations for [facility]. I will not pursue CIR for this Pt. CIR to s/o.  Megan Salon, MS, CCC-SLP Rehab Admissions Coordinator  [phone] (celll) [phone] (office)

## 2021-05-09 NOTE — Progress Notes (Signed)
Patient ID: [name], female   DOB: [birth year], 60 y.o.   MRN: [id number] Patient is postoperative day 1 right above-the-knee amputation she has no complaints there is no drainage in the wound VAC canister there are 2 checks.  I will reorder her home medications.  Anticipate discharge to home with home health therapy.

## 2021-05-10 ENCOUNTER — Encounter (HOSPITAL_COMMUNITY): Payer: Self-pay | Admitting: Orthopedic Surgery

## 2021-05-10 LAB — BASIC METABOLIC PANEL
Anion gap: 10 (ref 5–15)
BUN: 18 mg/dL (ref 6–20)
CO2: 30 mmol/L (ref 22–32)
Calcium: 8.7 mg/dL — ABNORMAL LOW (ref 8.9–10.3)
Chloride: 99 mmol/L (ref 98–111)
Creatinine, Ser: 1.39 mg/dL — ABNORMAL HIGH (ref 0.44–1.00)
GFR, Estimated: 44 mL/min — ABNORMAL LOW (ref >60)
Glucose, Bld: 102 mg/dL — ABNORMAL HIGH (ref 70–99)
Potassium: 4.1 mmol/L (ref 3.5–5.1)
Sodium: 139 mmol/L (ref 135–145)

## 2021-05-10 LAB — GLUCOSE, CAPILLARY
Glucose-Capillary: 116 mg/dL — ABNORMAL HIGH (ref 70–99)
Glucose-Capillary: 154 mg/dL — ABNORMAL HIGH (ref 70–99)
Glucose-Capillary: 134 mg/dL — ABNORMAL HIGH (ref 70–99)
Glucose-Capillary: 170 mg/dL — ABNORMAL HIGH (ref 70–99)
Glucose-Capillary: 161 mg/dL — ABNORMAL HIGH (ref 70–99)

## 2021-05-10 LAB — CBC
HCT: 36.7 % (ref 36.0–46.0)
Hemoglobin: 11.3 g/dL — ABNORMAL LOW (ref 12.0–15.0)
MCH: 30.2 pg (ref 26.0–34.0)
MCHC: 30.8 g/dL (ref 30.0–36.0)
MCV: 98.1 fL (ref 80.0–100.0)
Platelets: 244 10*3/uL (ref 150–400)
RBC: 3.74 MIL/uL — ABNORMAL LOW (ref 3.87–5.11)
RDW: 13.2 % (ref 11.5–15.5)
WBC: 11.1 10*3/uL — ABNORMAL HIGH (ref 4.0–10.5)
nRBC: 0 % (ref 0.0–0.2)

## 2021-05-10 MED ORDER — SODIUM CHLORIDE 0.9 % IV SOLN: INTRAVENOUS | Status: DC

## 2021-05-10 NOTE — Progress Notes (Signed)
Physical Therapy Treatment Patient Details Name: [name] MRN: [id number] DOB: 1961/04/29 Today's Date: 05/10/2021    History of Present Illness 60 y.o. 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 PMH consists of DM1, peripheral neuropathy, gastroparesis, and retinopathy.    PT Comments    Mobility continues to be limited by symptomatic orthostatic hypotension (see chart below). Pt required min assist bed mobility, and demonstrated good sitting balance EOB. Upon sitting, pt with N&V and c/o increased dizziness requiring return to supine. Pt provided with HEP handouts but unable to actively participate in exercises due to feeling ill from mobility attempt. RN notified.                                                       BP Supine                                    123/65 Sitting                                      96/63 Initial return to supine            86/53 After 3 minutes in sup            86/53   Follow Up Recommendations  Home health PT;Supervision/Assistance - 24 hour     Equipment Recommendations  None recommended by PT    Recommendations for Other Services       Precautions / Restrictions Precautions Precautions: Fall;Other (comment) Precaution Comments: wound vac, orthostatic Required Braces or Orthoses: Other Brace Other Brace: R shrinker and limb protector Restrictions RLE Weight Bearing: Non weight bearing    Mobility  Bed Mobility Overal bed mobility: Needs Assistance Bed Mobility: Supine to Sit     Supine to sit: Min assist;HOB elevated Sit to supine: Min assist;HOB elevated   General bed mobility comments: +rail, cues for sequencing    Transfers                 General transfer comment: unable due to orthostatic hypotension  Ambulation/Gait                 Stairs             Wheelchair Mobility    Modified Rankin (Stroke Patients Only)       Balance Overall balance assessment:  Needs assistance Sitting-balance support: Feet supported;No upper extremity supported Sitting balance-Leahy Scale: Good                                      Cognition Arousal/Alertness: Awake/alert Behavior During Therapy: WFL for tasks assessed/performed Overall Cognitive Status: Impaired/Different from baseline Area of Impairment: Awareness;Attention;Memory;Problem solving                   Current Attention Level: Selective Memory: Decreased short-term memory     Awareness: Emergent Problem Solving: Difficulty sequencing;Requires verbal cues        Exercises Amputee Exercises Quad Sets: AROM;Right;5 reps Other Exercises Other Exercises: HEP handouts provided. Unable to participate  in exercises due to feeling ill after mobility attempt.    General Comments General comments (skin integrity, edema, etc.): mobility limited by symptomatic orthostatic hypotension. Pt on 2L O2.      Pertinent Vitals/Pain Pain Assessment: Faces Faces Pain Scale: Hurts little more Pain Location: R residual limb Pain Descriptors / Indicators: Grimacing;Guarding;Sore Pain Intervention(s): Monitored during session;Repositioned;Limited activity within patient's tolerance    Home Living                      Prior Function            PT Goals (current goals can now be found in the care plan section) Acute Rehab PT Goals Patient Stated Goal: home Progress towards PT goals: Not progressing toward goals - comment (orthostatic hypostension)    Frequency    Min 3X/week      PT Plan Current plan remains appropriate    Co-evaluation              AM-PAC PT "6 Clicks" Mobility   Outcome Measure  Help needed turning from your back to your side while in a flat bed without using bedrails?: None Help needed moving from lying on your back to sitting on the side of a flat bed without using bedrails?: A Little Help needed moving to and from a bed to a chair  (including a wheelchair)?: A Lot Help needed standing up from a chair using your arms (e.g., wheelchair or bedside chair)?: A Lot Help needed to walk in hospital room?: Total Help needed climbing 3-5 steps with a railing? : Total 6 Click Score: 13    End of Session Equipment Utilized During Treatment: Gait belt;Oxygen Activity Tolerance: Treatment limited secondary to medical complications (Comment) (orthostatic hypotension) Patient left: in bed;with call bell/phone within reach;with bed alarm set Nurse Communication: Mobility status;Other (comment) (BP) PT Visit Diagnosis: Other abnormalities of gait and mobility (R26.89);Pain Pain - Right/Left: Right Pain - part of body: Leg     Time: 5400-8676 PT Time Calculation (min) (ACUTE ONLY): 26 min  Charges:  $Therapeutic Exercise: 8-22 mins $Therapeutic Activity: 8-22 mins                     Aida Raider, PT  Office # [phone] Pager [phone]    Ilda Foil 05/10/2021, 1:57 PM

## 2021-05-10 NOTE — Progress Notes (Addendum)
Subjective: 2 Days Post-Op Procedure(s) (LRB): RIGHT BELOW KNEE AMPUTATION (Right) Patient reports pain as mild.  Just woke up and feeling a bit woozy.  No chest pain/sob.  No nausea/vomiting. Says this is not uncommon to feel upon awakening.  Minimal pain to bka stump  Objective: Vital signs in last 24 hours: Temp:  [98.9 F (37.2 C)-100.6 F (38.1 C)] 100.6 F (38.1 C) (06/12 0404) Pulse Rate:  [86-100] 100 (06/12 0404) Resp:  [15-18] 15 (06/12 0404) BP: (112-128)/(65-74) 128/65 (06/12 0404) SpO2:  [92 %-98 %] 94 % (06/12 0404)  Intake/Output from previous day: 06/11 0701 - 06/12 0700 In: 240 [P.O.:240] Out: 1100 [Urine:500; Stool:600] Intake/Output this shift: No intake/output data recorded.  Recent Labs    05/08/21 0600 05/09/21 0438 05/10/21 0349  HGB 12.4 11.5* 11.3*   Recent Labs    05/09/21 0438 05/10/21 0349  WBC 13.3* 11.1*  RBC 3.76* 3.74*  HCT 37.0 36.7  PLT 243 244   Recent Labs    05/09/21 0438 05/10/21 0349  NA 139 139  K 4.2 4.1  CL 99 99  CO2 31 30  BUN 16 18  CREATININE 1.03* 1.39*  GLUCOSE 151* 102*  CALCIUM 8.8* 8.7*   No results for input(s): LABPT, INR in the last 72 hours.  Neurologically intact Stump shrinker in place Wound vac in place and functioning without fluid in canister   Assessment/Plan: 2 Days Post-Op Procedure(s) (LRB): RIGHT BELOW KNEE AMPUTATION (Right) Up with therapy Plan for discharge tomorrow home with hhpt Continue stump shrinker Continue wound vac NWB RLE Decreased renal function- Continue fluids at 48ml/hr.  Encouraged po intake      SANELA EVOLA 05/10/2021, 7:51 AM

## 2021-05-10 NOTE — Plan of Care (Signed)

## 2021-05-11 LAB — GLUCOSE, CAPILLARY
Glucose-Capillary: 109 mg/dL — ABNORMAL HIGH (ref 70–99)
Glucose-Capillary: 108 mg/dL — ABNORMAL HIGH (ref 70–99)
Glucose-Capillary: 177 mg/dL — ABNORMAL HIGH (ref 70–99)
Glucose-Capillary: 177 mg/dL — ABNORMAL HIGH (ref 70–99)
Glucose-Capillary: 168 mg/dL — ABNORMAL HIGH (ref 70–99)

## 2021-05-11 LAB — SURGICAL PATHOLOGY

## 2021-05-11 NOTE — Progress Notes (Signed)
Occupational Therapy Treatment Patient Details Name: [name] MRN: [id number] DOB: [birth year] Today's Date: 05/11/2021    History of present illness 60 y.o. 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 PMH consists of DM1, peripheral neuropathy, gastroparesis, and retinopathy.   OT comments  Pt is not making progress towards therapy goals due to orthostatic BP with change in positions. Upon arrival pt resting with foam under knee in flexion; spent increased time educating pt of residual limb care (see below); pt and husband verbalized understanding of all. Pt reported feeling nauseous upon arrival and maintained feeling throughout without associated dizziness or vomiting. Pt completed bed mobility with min A and vc for sequencing and problem solving. Upon sitting EOB pt became orthostatic. BP & position listed below. Pt benefits from skill OT to progress ADLs and mobility. Pt reports wanting to explore SNF options at discharge - pt and husband verbalized that pt needs to be able to complete surface transfers with supervision only in order to go home safely, currently requiring mod A +2. Pending pt progress, pt may ned short SNF stay.   Supine:  136/67    After initially sitting EOB:  110/56 After sitting EOB for 2 minutes:   84/64     Follow Up Recommendations  Home health OT;Supervision/Assistance - 24 hour;Other (comment);SNF (Pt discussed rehab options with PA; she is not comfortable with transitioning home at the level she is currently at. Pt and husband verbalized that pt would need to complete surfaces transfers at a supervision level to safety d/c home. currently mod A +2)    Equipment Recommendations  None recommended by OT       Precautions / Restrictions Precautions Precautions: Fall;Other (comment) Precaution Comments: wound vac, orthostatic Required Braces or Orthoses: Other Brace Other Brace: R shrinker and limb  protector Restrictions Weight Bearing Restrictions: Yes RLE Weight Bearing: Non weight bearing       Mobility Bed Mobility Overal bed mobility: Needs Assistance Bed Mobility: Rolling;Sidelying to Sit;Sit to Supine Rolling: Min guard Sidelying to sit: Min assist   Sit to supine: Min assist;HOB elevated   General bed mobility comments: +rails, cues fro sequencing, assistance for trunk management    Transfers Overall transfer level: Needs assistance               General transfer comment: deferred OOB transfer this session due to orthostatic BP drop    Balance Overall balance assessment: Needs assistance Sitting-balance support: Feet supported;No upper extremity supported Sitting balance-Leahy Scale: Fair            ADL either performed or assessed with clinical judgement   ADL Overall ADL's : Needs assistance/impaired            Functional mobility during ADLs:  (limited to bed mobility this session, Min A to sit EOB with verbal cues for sequencing and problem solving) General ADL Comments: Verbally reviewed tub bend trasnfer, pt and husband confirmed that they have been using a tub bench at home for several months. Educated on limb care.       Cognition Arousal/Alertness: Awake/alert Behavior During Therapy: WFL for tasks assessed/performed Overall Cognitive Status: Impaired/Different from baseline Area of Impairment: Awareness;Attention;Memory;Problem solving                   Current Attention Level: Selective Memory: Decreased short-term memory     Awareness: Emergent Problem Solving: Difficulty sequencing;Requires verbal cues General Comments: Pt easily distracted throughout session, easy to rediarect,  requried verbal cues to rpoblem solve bed mobility; asking appropriate questions in regards to limb care and ADLs               General Comments Spend incrased time educating pt and husband about residual limb care: rest with knee  extended, tactile input for phantom pain management, wound vac management with dresssing, process of shrinking limb and importance of care to lead to prothsetic - pt and husband were grateful for the education adn ask appropriate questions throughout    Pertinent Vitals/ Pain       Pain Assessment: No/denies pain Pain Intervention(s): Limited activity within patient's tolerance (pt reports some nausea at the start of the session)    Frequency  Min 2X/week        Progress Toward Goals  OT Goals(current goals can now be found in the care plan section)  Progress towards OT goals: Not progressing toward goals - comment  Acute Rehab OT Goals Patient Stated Goal: determine need for rehab OT Goal Formulation: With patient Time For Goal Achievement: 05/23/21 Potential to Achieve Goals: Good ADL Goals Pt Will Perform Lower Body Bathing: with modified independence;sitting/lateral leans Pt Will Perform Lower Body Dressing: with modified independence;sit to/from stand Pt Will Transfer to Toilet: with modified independence;ambulating;bedside commode Pt Will Perform Tub/Shower Transfer: Tub transfer;with transfer board;with modified independence;ambulating  Plan Discharge plan needs to be updated        AM-PAC OT "6 Clicks" Daily Activity     Outcome Measure   Help from another person eating meals?: A Little Help from another person taking care of personal grooming?: A Little Help from another person toileting, which includes using toliet, bedpan, or urinal?: A Lot Help from another person bathing (including washing, rinsing, drying)?: A Lot Help from another person to put on and taking off regular upper body clothing?: A Little Help from another person to put on and taking off regular lower body clothing?: A Lot 6 Click Score: 15    End of Session Equipment Utilized During Treatment: Other (comment) (wound vac)  OT Visit Diagnosis: Unsteadiness on feet (R26.81);Other abnormalities  of gait and mobility (R26.89);Muscle weakness (generalized) (M62.81);History of falling (Z91.81);Pain   Activity Tolerance Patient tolerated treatment well;Other (comment) (Pt limited by orthostatic BP)   Patient Left in bed;with call bell/phone within reach;with family/visitor present   Nurse Communication Mobility status;Other (comment) (vitals)        Time: 8676-7209 OT Time Calculation (min): 24 min  Charges: OT General Charges $OT Visit: 1 Visit OT Treatments $Self Care/Home Management : 8-22 mins $Therapeutic Activity: 23-37 mins      Damek Ende A Rosaleah Person 05/11/2021, 3:22 PM

## 2021-05-11 NOTE — Progress Notes (Signed)
Is postop day 3 status post right below-knee amputation.  She is overall doing well.  She has some trouble with orthostatic hypotension.  She is sitting in bed with breakfast pain is well controlled  She has 2 green checks on her wound VAC.  Recommendation of PT OT is discharged to home with home health.  Patient states she is not quite ready to go home yet would like at least another day.  We will plan discharge tomorrow

## 2021-05-11 NOTE — Progress Notes (Signed)
Pt orthostatic with therapy. I messaged ortho PA about their findings. Pt is laying back in bed but did become dizzy and symptomatic when changing positions with therapy.

## 2021-05-12 LAB — GLUCOSE, CAPILLARY
Glucose-Capillary: 153 mg/dL — ABNORMAL HIGH (ref 70–99)
Glucose-Capillary: 199 mg/dL — ABNORMAL HIGH (ref 70–99)
Glucose-Capillary: 223 mg/dL — ABNORMAL HIGH (ref 70–99)
Glucose-Capillary: 200 mg/dL — ABNORMAL HIGH (ref 70–99)

## 2021-05-12 NOTE — Progress Notes (Signed)
Subjective: 4 Days Post-Op Procedure(s) (LRB): RIGHT BELOW KNEE AMPUTATION (Right) Patient reports pain as mild.    Objective: Vital signs in last 24 hours: Temp:  [98.3 F (36.8 C)] 98.3 F (36.8 C) (06/14 0215) Pulse Rate:  [70-79] 79 (06/14 0215) Resp:  [16-18] 16 (06/14 0215) BP: (131-136)/(63-67) 134/63 (06/14 0215) SpO2:  [97 %] 97 % (06/14 0215)  Intake/Output from previous day: 06/13 0701 - 06/14 0700 In: 480 [P.O.:480] Out: 3100 [Urine:3100] Intake/Output this shift: No intake/output data recorded.  Recent Labs    05/10/21 0349  HGB 11.3*   Recent Labs    05/10/21 0349  WBC 11.1*  RBC 3.74*  HCT 36.7  PLT 244   Recent Labs    05/10/21 0349  NA 139  K 4.1  CL 99  CO2 30  BUN 18  CREATININE 1.39*  GLUCOSE 102*  CALCIUM 8.7*   No results for input(s): LABPT, INR in the last 72 hours.  Patient has improved range of motion of her right knee   Assessment/Plan: 4 Days Post-Op Procedure(s) (LRB): RIGHT BELOW KNEE AMPUTATION (Right) Discharge to SNF   Patient has had difficulty progressing with physical therapy she agrees that she will need more aggressive therapy with skilled nursing placement orders are placed for social worker work with the patient for skilled nursing placement.   [name] 05/12/2021, 8:09 AM

## 2021-05-12 NOTE — Plan of Care (Signed)

## 2021-05-12 NOTE — Plan of Care (Signed)

## 2021-05-12 NOTE — Progress Notes (Signed)
Physical Therapy Treatment Patient Details Name: [name] MRN: [id number] DOB: [birth year] Today's Date: 05/12/2021    History of Present Illness 60 y.o. 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 PMH consists of DM1, peripheral neuropathy, gastroparesis, and retinopathy.    PT Comments    Pt able to tolerate much more activity and get OOB today with stable BP. Is requiring 2 person assistance and feel she needs ST-SNF prior to return home.   Orthostatic BPs  Supine 123/71  Sitting 132/69  Sitting after 3 min 133/80  Sitting in chair 120/58     Follow Up Recommendations  SNF     Equipment Recommendations       Recommendations for Other Services       Precautions / Restrictions Precautions Precautions: Fall;Other (comment) Precaution Comments: wound vac, orthostatic Required Braces or Orthoses: Other Brace Other Brace: R shrinker and limb protector, lt AFO    Mobility  Bed Mobility Overal bed mobility: Needs Assistance Bed Mobility: Rolling;Sidelying to Sit;Sit to Supine Rolling: Min guard Sidelying to sit: Min assist       General bed mobility comments: Assist to bring hips to EOB. Verbal cues for sequencing    Transfers Overall transfer level: Needs assistance Equipment used: Rolling walker (2 wheeled);Ambulation equipment used Transfers: Sit to/from Stand Sit to Stand: +2 physical assistance;Mod assist         General transfer comment: Stood from bed with walker x 3. Assist to bring hips up and for balance. Unable to pivot with walker. Stood with Antony Salmon and used Stedy to take to chair  Ambulation/Gait                 Social research officer, [employer] (Stroke Patients Only)       Balance Overall balance assessment: Needs assistance Sitting-balance support: Feet supported;No upper extremity supported Sitting balance-Leahy Scale: Fair     Standing balance support:  Bilateral upper extremity supported;During functional activity Standing balance-Leahy Scale: Poor Standing balance comment: walker or Stedy. Mod assist of 1 or 2 to maintain.                            Cognition Arousal/Alertness: Awake/alert Behavior During Therapy: WFL for tasks assessed/performed Overall Cognitive Status: Impaired/Different from baseline Area of Impairment: Awareness;Attention;Memory;Problem solving                   Current Attention Level: Selective Memory: Decreased short-term memory     Awareness: Emergent Problem Solving: Difficulty sequencing;Requires verbal cues General Comments: Pt easily distracted      Exercises Amputee Exercises Quad Sets: AROM;Right;10 reps;Supine Hip Flexion/Marching: AROM;Right;10 reps;Supine Straight Leg Raises: AROM;Right;10 reps;Supine    General Comments General comments (skin integrity, edema, etc.): See assessment for BP's. Pt not orthostatic. spO2 98% on RA. Left O2 off      Pertinent Vitals/Pain Pain Assessment: Faces Faces Pain Scale: No hurt    Home Living                      Prior Function            PT Goals (current goals can now be found in the care plan section) Progress towards PT goals: Progressing toward goals    Frequency    Min 3X/week      PT Plan  Discharge plan needs to be updated    Co-evaluation              AM-PAC PT "6 Clicks" Mobility   Outcome Measure  Help needed turning from your back to your side while in a flat bed without using bedrails?: A Little Help needed moving from lying on your back to sitting on the side of a flat bed without using bedrails?: A Little Help needed moving to and from a bed to a chair (including a wheelchair)?: Total Help needed standing up from a chair using your arms (e.g., wheelchair or bedside chair)?: Total Help needed to walk in hospital room?: Total Help needed climbing 3-5 steps with a railing? : Total 6  Click Score: 10    End of Session Equipment Utilized During Treatment: Gait belt Activity Tolerance: Patient tolerated treatment well Patient left: with call bell/phone within reach;in chair;with chair alarm set;with family/visitor present Nurse Communication: Mobility status PT Visit Diagnosis: Other abnormalities of gait and mobility (R26.89)     Time: 6333-5456 PT Time Calculation (min) (ACUTE ONLY): 29 min  Charges:  $Therapeutic Activity: 23-37 mins                     [facility] PT Acute Rehabilitation Services Pager [phone] Office [phone]    Angelina Ok [facility] 05/12/2021, 3:26 PM

## 2021-05-13 LAB — GLUCOSE, CAPILLARY
Glucose-Capillary: 86 mg/dL (ref 70–99)
Glucose-Capillary: 152 mg/dL — ABNORMAL HIGH (ref 70–99)
Glucose-Capillary: 103 mg/dL — ABNORMAL HIGH (ref 70–99)
Glucose-Capillary: 187 mg/dL — ABNORMAL HIGH (ref 70–99)
Glucose-Capillary: 133 mg/dL — ABNORMAL HIGH (ref 70–99)

## 2021-05-13 LAB — SARS CORONAVIRUS 2 (TAT 6-24 HRS): SARS Coronavirus 2: NEGATIVE

## 2021-05-13 MED ORDER — OXYCODONE HCL 5 MG PO TABS: 5.0000 mg | ORAL_TABLET | ORAL | 0 refills | Status: DC | PRN

## 2021-05-13 NOTE — Discharge Summary (Signed)
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 female who was admitted 05/08/2021 with a chief complaint of Painful right foot, with a final diagnosis of Collapse Right Foot.  Patient was brought to the operating room on 05/08/2021 and underwent Procedure(s): RIGHT BELOW KNEE AMPUTATION.    Patient was given perioperative antibiotics:  Anti-infectives (From admission, onward)    Start     Dose/Rate Route Frequency Ordered Stop   05/08/21 1530  ceFAZolin (ANCEF) IVPB 2g/100 mL premix        2 g 200 mL/hr over 30 Minutes Intravenous Every 8 hours 05/08/21 1052 05/09/21 0215   05/08/21 0600  ceFAZolin (ANCEF) IVPB 2g/100 mL premix        2 g 200 mL/hr over 30 Minutes Intravenous On call to O.R. 05/08/21 1443 05/08/21 1540     .  Patient was given sequential compression devices, early ambulation, and aspirin for DVT prophylaxis.  Recent vital signs: Patient Vitals for the past 24 hrs:  BP Temp Temp src Pulse Resp SpO2  05/12/21 2117 129/72 98.6 F (37 C) -- 74 16 (!) 88 %  05/12/21 1357 (!) 120/58 98.5 F (36.9 C) Oral 74 19 98 %  05/12/21 0828 117/66 98.6 F (37 C) Oral 72 17 97 %  .  Recent laboratory studies: No results found.  Discharge Medications:   Allergies as of 05/13/2021       Reactions   Ceftriaxone    Other reaction(s): diarrhea   Codeine Other (See Comments)   Told to keep on the list    Food    MSG and Lactose (cannot have any dairy products)   Milk-related Compounds    Other reaction(s): diarrhea   Monosodium Glutamate    Other reaction(s): diarrhea   Other Nausea And Vomiting   MSG and Lactose Intolerant Other reaction(s): diarrhea Other reaction(s): hives Other reaction(s): pass out   Propranolol Hcl    Other reaction(s): fainting    Soybean-containing Drug Products Diarrhea        Medication List     STOP taking these medications    acetaminophen 500 MG tablet Commonly known as: TYLENOL       TAKE these medications    albuterol 108 (90 Base) MCG/ACT inhaler Commonly known as: VENTOLIN HFA Inhale 1 puff into the lungs every 4 (four) hours as needed for wheezing or shortness of breath.   aspirin 81 MG chewable tablet Chew 81 mg by mouth daily.   cycloSPORINE 0.05 % ophthalmic emulsion Commonly known as: RESTASIS Place 1 drop into both eyes 2 (two) times daily.   Dexcom G6 Sensor Misc SMARTSIG:1 Each Topical Every 10 Days   Dexcom G6 Transmitter Misc   diphenhydrAMINE 25 MG tablet Commonly known as: BENADRYL Take 25 mg by mouth 2 (two) times daily.   Dulera 100-5 MCG/ACT Aero Generic drug: mometasone-formoterol Inhale 2 puffs into the lungs 2 (two) times daily.   escitalopram 10 MG tablet Commonly known as: LEXAPRO Take 10 mg by mouth at bedtime.   estradiol 2 MG tablet Commonly known as: ESTRACE Take 2 mg by mouth daily.   famotidine 10 MG tablet Commonly known as: PEPCID Take 10 mg by mouth 2 (two) times daily.   GAS-X EXTRA STRENGTH PO Take 1 tablet by  mouth daily as needed (gas relief).   hydrocortisone 2.5 % cream Apply 1 application topically daily as needed (itching).   hydrocortisone 2.5 % rectal cream Commonly known as: ANUSOL-HC Place 1 application rectally 2 (two) times daily as needed for hemorrhoids or anal itching.   insulin lispro 100 UNIT/ML injection Commonly known as: HUMALOG Has with insulin pump   insulin pump Soln Inject 1 each into the skin See admin instructions. Insulin pump MEDTRONIC with humalog (Will be changing to TANDEM).Works with DEXCOM 4 CGM   lansoprazole 30 MG capsule Commonly known as: PREVACID Take 60 mg by mouth 2 (two) times daily before a meal.   levocetirizine 5 MG tablet Commonly known as: XYZAL Take 5 mg by mouth 2 (two) times  daily.   levothyroxine 112 MCG tablet Commonly known as: SYNTHROID Take 112 mcg by mouth daily.   loperamide 2 MG tablet Commonly known as: IMODIUM A-D Take 2 mg by mouth 4 (four) times daily as needed for diarrhea or loose stools.   montelukast 10 MG tablet Commonly known as: SINGULAIR Take 10 mg by mouth at bedtime.   MULTIVITAMIN GUMMIES ADULT PO Take 2 each by mouth daily.   Nasonex 50 MCG/ACT nasal spray Generic drug: mometasone Place 2 sprays into the nose daily. OTC   oxyCODONE 5 MG immediate release tablet Commonly known as: Oxy IR/ROXICODONE Take 1-2 tablets (5-10 mg total) by mouth every 4 (four) hours as needed for moderate pain (pain score 4-6).   PANCREAZE PO Take 3-4 capsules by mouth daily as needed (diarrhea). Take 4 capsules with a meal and 3 capsules with a snack   REFRESH OPTIVE OP Place 1 drop into both eyes daily as needed (dry eyes).   rosuvastatin 40 MG tablet Commonly known as: CRESTOR Take 40 mg by mouth every evening.       ASK your doctor about these medications    ammonium lactate 12 % lotion Commonly known as: AmLactin Apply 1 application topically as needed for dry skin.   gabapentin 300 MG capsule Commonly known as: NEURONTIN Take 1 capsule (300 mg total) by mouth 3 (three) times daily.   lamoTRIgine 150 MG tablet Commonly known as: LAMICTAL TAKE 1 TABLET BY MOUTH TWICE DAILY   primidone 50 MG tablet Commonly known as: MYSOLINE TAKE 1 TABLET BY MOUTH IN THE MORNING , 1 TABLET AT NOON AND ONE AND ONE-HALF (1 & 1/2) TABLETS IN THE EVENING        Diagnostic Studies: Color Fundus Photography Optos - OU - Both Eyes  Result Date: 04/28/2021 Right Eye Progression has worsened. Disc findings include normal observations. Vessels : Neovascularization. Notes Moderate scattered preretinal and vitreous hemorrhage noted particularly nasal to the nerve OD.  Room for more laser PRP posterior to the equator inferonasal OS with quiescent  proliferative diabetic retinopathy in excellent PRP anterior and posterior to the equator 360   Patient benefited maximally from their hospital stay and there were no complications.     Disposition: Discharge disposition: 03-Skilled Nursing Facility      Discharge Instructions     Call MD / Call 911   Complete by: As directed    If you experience chest pain or shortness of breath, CALL 911 and be transported to the hospital emergency room.  If you develope a fever above 101 F, pus (white drainage) or increased drainage or redness at the wound, or calf pain, call your surgeon's office.   Constipation Prevention   Complete by: As directed  Drink plenty of fluids.  Prune juice may be helpful.  You may use a stool softener, such as Colace (over the counter) 100 mg twice a day.  Use MiraLax (over the counter) for constipation as needed.   Diet - low sodium heart healthy   Complete by: As directed    Discharge instructions   Complete by: As directed    Call office if preveena pump alarms   Increase activity slowly as tolerated   Complete by: As directed    Negative Pressure Wound Therapy - Incisional   Complete by: As directed    Show patient how to attach preveena pump   Post-operative opioid taper instructions:   Complete by: As directed    POST-OPERATIVE OPIOID TAPER INSTRUCTIONS: It is important to wean off of your opioid medication as soon as possible. If you do not need pain medication after your surgery it is ok to stop day one. Opioids include: Codeine, Hydrocodone(Norco, Vicodin), Oxycodone(Percocet, oxycontin) and hydromorphone amongst others.  Long term and even short term use of opiods can cause: Increased pain response Dependence Constipation Depression Respiratory depression And more.  Withdrawal symptoms can include Flu like symptoms Nausea, vomiting And more Techniques to manage these symptoms Hydrate well Eat regular healthy meals Stay active Use  relaxation techniques(deep breathing, meditating, yoga) Do Not substitute Alcohol to help with tapering If you have been on opioids for less than two weeks and do not have pain than it is ok to stop all together.  Plan to wean off of opioids This plan should start within one week post op of your joint replacement. Maintain the same interval or time between taking each dose and first decrease the dose.  Cut the total daily intake of opioids by one tablet each day Next start to increase the time between doses. The last dose that should be eliminated is the evening dose.          Follow-up Information     Trinette Vera, Stephanne [place], [place] Follow up in 1 week(s).   Specialty: Orthopedic Surgery Contact information: [address] [place] [address] [phone]         Care, [facility] Follow up.   Specialty: Home Health Services Why: home health services will be provided by [facility], PT,OT. Start of care within 48 hours post discharge Contact information: [address] [place] [address] [phone]                  Signed: Vasilia Dise Marquite Attwood 05/13/2021, 7:16 AM

## 2021-05-13 NOTE — Progress Notes (Signed)
Patient is 5 days status post right below-knee amputation   She has 2 checks on her wound VAC she reports pain is minimal.  She feels she is slow to progress with physical therapy   Vital signs stable alert pleasant to exam appropriate to questioning afebrile.  Plan will be discharged to skilled nursing as soon as bed available.  Will write orders and leave prescription for pain medication on her chart

## 2021-05-13 NOTE — TOC Initial Note (Addendum)
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 RNCM spoke with patient regarding PT recommendation of SNF placement at time of discharge. Patient reported that patient's spouse Theron Arista is currently unable to care for patient at their home given patient's current physical needs and fall risk. Patient expressed understanding of PT recommendation and is agreeable to SNF placement at time of discharge. Patient without preference for SNF . RNCM discussed insurance authorization process and provided Medicare SNF ratings list. Patient expressed being hopeful for rehab and to feel better soon. No further questions reported at this time. RNCM to continue to follow and assist with discharge planning needs.  Referrals for SNF sent via HUB. SNF BED OFFERS PENDING, once identified SNF will initiate insurance authorization.  Pt not COVID vaccinated   6/15  @1400  [facility] / rehab reviewing for acceptance ....  Expected Discharge Plan: Skilled Nursing Facility Barriers to Discharge: No SNF bed, Insurance Authorization   Patient Goals and CMS Choice     Choice offered to / list presented to : Patient  Expected Discharge Plan and Services Expected Discharge Plan: Skilled Nursing Facility     Post Acute Care Choice: Home Health Living arrangements for the past 2 months: Single Family Home Expected Discharge Date: 05/13/21                         HH Arranged: PT, OT HH Agency: [facility] Date [facility] Agency Contacted: 05/11/21 Time HH Agency Contacted: [phone] Representative spoke with at [facility] Agency: [facility]  Prior Living Arrangements/Services Living arrangements for the  past 2 months: Single Family Home     Do you feel safe going back to the place where you live?: Yes               Activities of Daily Living Home Assistive Devices/Equipment: Eyeglasses, Wheelchair, Kandee Keen (specify type), Other (Comment), CBG Meter, Blood pressure cuff, Hand-held shower hose, Grab bars in shower, Brace (specify type), Raised toilet seat with rails (scooter; bilateral leg braces) ADL Screening (condition at time of admission) Patient's cognitive ability adequate to safely complete daily activities?: Yes Is the patient deaf or have difficulty hearing?: No Does the patient have difficulty seeing, even when wearing glasses/contacts?: No Does the patient have difficulty concentrating, remembering, or making decisions?: Yes (remembering) Patient able to express need for assistance with ADLs?: Yes Does the patient have difficulty dressing or bathing?: Yes Independently performs ADLs?: No Communication: Independent Dressing (OT): Needs assistance Is this a change from baseline?: Pre-admission baseline Grooming: Independent Feeding: Independent Bathing: Needs assistance Is this a change from baseline?: Pre-admission baseline Toileting: Needs assistance, Independent with device (comment) Is this a change from baseline?: Pre-admission baseline In/Out Bed: Needs assistance Is this a change from baseline?: Pre-admission baseline Walks in Home: Needs assistance Is this a change from baseline?: Pre-admission baseline Does the patient have difficulty walking or climbing stairs?: Yes Weakness of Legs: Both Weakness of Arms/Hands: None  Permission Sought/Granted                  Emotional Assessment  Orientation: : Oriented to Self, Oriented to Place, Oriented to  Time, Oriented to Situation Alcohol / Substance Use: Not Applicable Psych Involvement: No (comment)  Admission diagnosis:  Foot pain, right [M79.671] Patient Active Problem List   Diagnosis Date  Noted   Foot pain, right 05/08/2021   Charcot's joint, right ankle and foot    Allergic rhinitis 03/17/2021   Anxiety 03/17/2021   Diabetic polyneuropathy (HCC) 03/17/2021   External hemorrhoids 03/17/2021   History of anemia 03/17/2021   Hyperglycemia due to type 1 diabetes mellitus (HCC) 03/17/2021   Long term (current) use of insulin (HCC) 03/17/2021   Personal history of other diseases of the respiratory system 03/17/2021   Recurrent major depression (HCC) 03/17/2021   Proliferative diabetic retinopathy of right eye without macular edema associated with type 1 diabetes mellitus (HCC) 12/25/2020   Stable treated proliferative diabetic retinopathy of left eye with macular edema determined by examination associated with type 1 diabetes mellitus (HCC) 12/23/2020   Vitreous hemorrhage, right eye (HCC) 12/23/2020   Cellulitis 06/11/2020   Diabetic foot infection (HCC) 06/10/2020   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 03/05/2019   Special screening for malignant neoplasms, colon 01/24/2017   Difficulty in swallowing 01/24/2017   Esophageal reflux 01/24/2017   DM (diabetes mellitus) type 1 with ketoacidosis (HCC) 03/01/2015   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) 03/01/2015   Diabetic neuropathy (HCC) 03/01/2015   Hypothyroidism 03/01/2015   Depression 03/01/2015   Orthostatic hypotension 03/01/2015   Essential tremor 03/01/2015   Diabetic retinopathy (HCC) 03/01/2015   Gastroparesis due to DM (HCC) 03/01/2015   Chronic renal insufficiency 03/01/2015   Foot drop, bilateral 03/01/2015   HLD (hyperlipidemia) 03/01/2015   Obstructive sleep apnea 03/01/2015   PCP:  Soundra Pilon, [name] Pharmacy:   [facility] - [place], [place] - [address] [place] [address] Phone: [phone] Fax: [phone]  [facility] - [place], [place] - [address] [place] [address] Phone:  [phone] Fax: [phone]     Social Determinants of Health (SDOH) Interventions    Readmission Risk Interventions No flowsheet data found.

## 2021-05-13 NOTE — NC FL2 (Addendum)
[place] MEDICAID FL2 LEVEL OF CARE SCREENING TOOL     IDENTIFICATION  Patient Name: [name] Birthdate: 27-Jun-1961 Sex: female Admission Date (Current Location): 05/08/2021  [facility] and [place] Number:  Producer, television/film/video and Address:  [facility] [place], [address] [place], [place] [address]      Provider Number: [id number]  Attending Physician Name and Address:  [name], MD  Relative Name and Phone Number:       Current Level of Care: SNF Recommended Level of Care: Skilled Nursing Facility Prior Approval Number:    Date Approved/Denied:   PASRR Number:  [id number]  Discharge Plan: SNF    Current Diagnoses: Patient Active Problem List   Diagnosis Date Noted   Foot pain, right 05/08/2021   Charcot's joint, right ankle and foot    Allergic rhinitis 03/17/2021   Anxiety 03/17/2021   Diabetic polyneuropathy (HCC) 03/17/2021   External hemorrhoids 03/17/2021   History of anemia 03/17/2021   Hyperglycemia due to type 1 diabetes mellitus (HCC) 03/17/2021   Long term (current) use of insulin (HCC) 03/17/2021   Personal history of other diseases of the respiratory system 03/17/2021   Recurrent major depression (HCC) 03/17/2021   Proliferative diabetic retinopathy of right eye without macular edema associated with type 1 diabetes mellitus (HCC) 12/25/2020   Stable treated proliferative diabetic retinopathy of left eye with macular edema determined by examination associated with type 1 diabetes mellitus (HCC) 12/23/2020   Vitreous hemorrhage, right eye (HCC) 12/23/2020   Cellulitis 06/11/2020   Diabetic foot infection (HCC) 06/10/2020   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 03/05/2019   Special screening for malignant neoplasms, colon 01/24/2017   Difficulty in swallowing 01/24/2017   Esophageal reflux 01/24/2017   DM (diabetes mellitus) type 1 with ketoacidosis (HCC) 03/01/2015   Diabetes mellitus type 1 with  complications (HCC) 03/01/2015   Diabetic neuropathy (HCC) 03/01/2015   Hypothyroidism 03/01/2015   Depression 03/01/2015   Orthostatic hypotension 03/01/2015   Essential tremor 03/01/2015   Diabetic retinopathy (HCC) 03/01/2015   Gastroparesis due to DM (HCC) 03/01/2015   Chronic renal insufficiency 03/01/2015   Foot drop, bilateral 03/01/2015   HLD (hyperlipidemia) 03/01/2015   Obstructive sleep apnea 03/01/2015    Orientation RESPIRATION BLADDER Height & Weight     Self, Time, Situation, Place  Normal External catheter Weight: 79.4 kg Height:  5\' 5"  (165.1 cm)  BEHAVIORAL SYMPTOMS/MOOD NEUROLOGICAL BOWEL NUTRITION STATUS      Continent Diet (refer to d/c summary)  AMBULATORY STATUS COMMUNICATION OF NEEDS Skin   Extensive Assist Verbally Surgical wounds (Transtibial amputation right  Application of Prevena wound VAC, 6/10)                       Personal Care Assistance Level of Assistance  Bathing, Feeding, Dressing Bathing Assistance: Maximum assistance Feeding assistance: Independent Dressing Assistance: Maximum assistance     Functional Limitations Info  Sight, Hearing, Speech Sight Info: Adequate Hearing Info: Adequate Speech Info: Adequate    SPECIAL CARE FACTORS FREQUENCY  PT (By licensed PT), OT (By licensed OT)     PT Frequency: 5x/week, evaluate and treat OT Frequency: 5x/week, evaluate and treat            Contractures Contractures Info: Not present    Additional Factors Info  Code Status, Allergies Code Status Info: Full Code Allergies Info: Ceftriaxone, Codeine, Food, Milk-related Compounds, Monosodium Glutamate, Other, Propranolol Hcl, Soybean-containing Drug Products  Current Medications (05/13/2021):  This is the current hospital active medication list Current Facility-Administered Medications  Medication Dose Route Frequency Provider Last Rate Last Admin   0.9 %  sodium chloride infusion   Intravenous Continuous Aashvi, [place], PA-C 50 mL/hr at 05/12/21 1858 New Bag at 05/12/21 1858   acetaminophen (TYLENOL) tablet 325-650 mg  325-650 mg Oral Q6H PRN Persons, [place], PA   650 mg at 05/12/21 1042   alum & mag hydroxide-simeth (MAALOX/MYLANTA) 200-200-20 MG/5ML suspension 15-30 mL  15-30 mL Oral Q2H PRN Persons, [place], PA       ascorbic acid (VITAMIN C) tablet 1,000 mg  1,000 mg Oral Daily Persons, [place], PA   1,000 mg at 05/12/21 1033   bisacodyl (DULCOLAX) EC tablet 5 mg  5 mg Oral Daily PRN Persons, [place], PA       docusate sodium (COLACE) capsule 100 mg  100 mg Oral Daily Persons, [place], PA   100 mg at 05/12/21 1033   escitalopram (LEXAPRO) tablet 10 mg  10 mg Oral QHS [name], MD   10 mg at 05/12/21 2118   gabapentin (NEURONTIN) capsule 900 mg  900 mg Oral QHS [name], MD   900 mg at 05/12/21 2118   guaiFENesin-dextromethorphan (ROBITUSSIN DM) 100-10 MG/5ML syrup 15 mL  15 mL Oral Q4H PRN Persons, [place], PA       HYDROmorphone (DILAUDID) injection 0.5 mg  0.5 mg Intravenous Q4H PRN Persons, [place], PA   0.5 mg at 05/09/21 2352   insulin pump   Subcutaneous TID WC, HS, 0200 Persons, [place], PA   Given at 05/12/21 1854   magnesium citrate solution 1 Bottle  1 Bottle Oral Once PRN Persons, [place], PA       magnesium sulfate IVPB 2 g 50 mL  2 g Intravenous Daily PRN Persons, [place], PA       nutrition supplement (JUVEN) (JUVEN) powder packet 1 packet  1 packet Oral BID BM Persons, [place], [place]   1 packet at 05/12/21 1639   ondansetron (ZOFRAN) injection 4 mg  4 mg Intravenous Q6H PRN Persons, [place], PA   4 mg at 05/12/21 2202   oxyCODONE (Oxy IR/ROXICODONE) immediate release tablet 5-10 mg  5-10 mg Oral Q4H PRN Persons, [place], PA   5 mg at 05/12/21 1639   pantoprazole (PROTONIX) EC tablet 40 mg  40 mg Oral Daily Persons, [place], PA   40 mg at 05/12/21 1033   phenol (CHLORASEPTIC) mouth spray 1 spray  1 spray Mouth/Throat PRN Persons, [place], PA        polyethylene glycol (MIRALAX / GLYCOLAX) packet 17 g  17 g Oral Daily PRN Persons, [place], PA       potassium chloride SA (KLOR-CON) CR tablet 20-40 mEq  20-40 mEq Oral Daily PRN Persons, [place], PA       primidone (MYSOLINE) tablet 50 mg  50 mg Oral TID [name], MD   50 mg at 05/12/21 2118   tranexamic acid (CYKLOKAPRON) IVPB 1,000 mg  1,000 mg Intravenous Once Persons, [place], PA       zinc sulfate capsule 220 mg  220 mg Oral Daily Persons, [place], PA   220 mg at 05/12/21 1033     Discharge Medications: Please see discharge summary for a list of discharge medications.  Relevant Imaging Results:  Relevant Lab Results:   Additional Information SS #  [id number]  Epifanio Lesches, RN

## 2021-05-13 NOTE — Progress Notes (Signed)
Occupational Therapy Treatment Patient Details Name: [name] MRN: [id number] DOB: 09-30-1961 Today's Date: 05/13/2021    History of present illness 60 y.o. 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 PMH consists of DM1, peripheral neuropathy, gastroparesis, and retinopathy.   OT comments  [name] is progressing well. Pt completed bed mobility at supervision level. To target progression of functional tasks completed OOB, pt compelled sit<>stand with min guard +2 for safety with use of BUE pulling to boost up in steady. Pt total A for bed>chair transfer. Pt on .5Lnc upon arrival at 91% SpO2 supine, O2 left off for the remainder of the session, SpO2 at 92%. Pt continues to benefit from continued OT acutely. Recommend d/c to SNF.  Supine: 134/66 Sitting EOB: 121/67 Sitting in chair after transfer: 118/63   Follow Up Recommendations  Supervision/Assistance - 24 hour;SNF    Equipment Recommendations  None recommended by OT       Precautions / Restrictions Precautions Precautions: Fall;Other (comment) Precaution Comments: wound vac, orthostatic Required Braces or Orthoses: Other Brace Other Brace: R shrinker and limb protector, lt AFO Restrictions Weight Bearing Restrictions: Yes RLE Weight Bearing: Non weight bearing       Mobility Bed Mobility Overal bed mobility: Needs Assistance Bed Mobility: Rolling;Sidelying to Sit;Sit to Supine     Supine to sit: Min guard;HOB elevated     General bed mobility comments: Assist to bring hips to EOB. Verbal cues for sequencing    Transfers Overall transfer level: Needs assistance Equipment used: Rolling walker (2 wheeled);Ambulation equipment used Transfers: Sit to/from Stand Sit to Stand: Min guard;+2 safety/equipment         General transfer comment: +2 this session for safety, pt min guard for sit<>stand with use of BUE pulling on steady    Balance Overall balance assessment: Needs  assistance Sitting-balance support: Feet supported;No upper extremity supported Sitting balance-Leahy Scale: Fair     Standing balance support: Bilateral upper extremity supported;During functional activity Standing balance-Leahy Scale: Poor Standing balance comment: BUE reliant on steady           ADL either performed or assessed with clinical judgement   ADL Overall ADL's : Needs assistance/impaired     Functional mobility during ADLs: Min guard;Total assistance;+2 for physical assistance (min guard for sit<>stand with use of steady - total A to bed>chair transfer) General ADL Comments: Session focused on progressing OOB mobility               Cognition Arousal/Alertness: Awake/alert Behavior During Therapy: WFL for tasks assessed/performed Overall Cognitive Status: Impaired/Different from baseline Area of Impairment: Awareness;Attention;Memory;Problem solving   Current Attention Level: Selective Memory: Decreased short-term memory     Awareness: Emergent Problem Solving: Difficulty sequencing;Requires verbal cues General Comments: Pt easily distracted              General Comments BP not orthostatic this session; O2 left off    Pertinent Vitals/ Pain       Pain Assessment: Faces Faces Pain Scale: No hurt Pain Intervention(s): Monitored during session   Frequency  Min 2X/week        Progress Toward Goals  OT Goals(current goals can now be found in the care plan section)  Progress towards OT goals: Progressing toward goals  Acute Rehab OT Goals Patient Stated Goal: determine need for rehab OT Goal Formulation: With patient Time For Goal Achievement: 05/23/21 Potential to Achieve Goals: Good ADL Goals Pt Will Perform Lower Body Bathing: with modified  independence;sitting/lateral leans Pt Will Perform Lower Body Dressing: with modified independence;sit to/from stand Pt Will Transfer to Toilet: with modified independence;ambulating;bedside  commode Pt Will Perform Tub/Shower Transfer: Tub transfer;with transfer board;with modified independence;ambulating  Plan Discharge plan needs to be updated       AM-PAC OT "6 Clicks" Daily Activity     Outcome Measure   Help from another person eating meals?: A Little Help from another person taking care of personal grooming?: A Little Help from another person toileting, which includes using toliet, bedpan, or urinal?: A Lot Help from another person bathing (including washing, rinsing, drying)?: A Lot Help from another person to put on and taking off regular upper body clothing?: A Little Help from another person to put on and taking off regular lower body clothing?: A Lot 6 Click Score: 15    End of Session Equipment Utilized During Treatment: Other (comment) (steady, wound vac)  OT Visit Diagnosis: Unsteadiness on feet (R26.81);Other abnormalities of gait and mobility (R26.89);Muscle weakness (generalized) (M62.81);History of falling (Z91.81);Pain Pain - Right/Left: Right Pain - part of body: Leg   Activity Tolerance Patient tolerated treatment well   Patient Left in chair;with call bell/phone within reach   Nurse Communication Mobility status        Time: 4496-7591 OT Time Calculation (min): 22 min  Charges: OT General Charges $OT Visit: 1 Visit OT Treatments $Therapeutic Activity: 8-22 mins     Palmina Clodfelter A Lloyd Cullinan 05/13/2021, 4:18 PM

## 2021-05-14 LAB — GLUCOSE, CAPILLARY
Glucose-Capillary: 135 mg/dL — ABNORMAL HIGH (ref 70–99)
Glucose-Capillary: 131 mg/dL — ABNORMAL HIGH (ref 70–99)
Glucose-Capillary: 130 mg/dL — ABNORMAL HIGH (ref 70–99)
Glucose-Capillary: 158 mg/dL — ABNORMAL HIGH (ref 70–99)
Glucose-Capillary: 135 mg/dL — ABNORMAL HIGH (ref 70–99)

## 2021-05-14 NOTE — Plan of Care (Signed)

## 2021-05-14 NOTE — Discharge Summary (Signed)
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 female who was admitted 05/08/2021 with a chief complaint of right foot pain, with a final diagnosis of Collapse Right Foot.  Patient was brought to the operating room on 05/08/2021 and underwent Procedure(s): RIGHT BELOW KNEE AMPUTATION.    Patient was given perioperative antibiotics:  Anti-infectives (From admission, onward)    Start     Dose/Rate Route Frequency Ordered Stop   05/08/21 1530  ceFAZolin (ANCEF) IVPB 2g/100 mL premix        2 g 200 mL/hr over 30 Minutes Intravenous Every 8 hours 05/08/21 1052 05/09/21 0215   05/08/21 0600  ceFAZolin (ANCEF) IVPB 2g/100 mL premix        2 g 200 mL/hr over 30 Minutes Intravenous On call to O.R. 05/08/21 1610 05/08/21 9604     .  Patient was given sequential compression devices, early ambulation, and aspirin for DVT prophylaxis.  Recent vital signs: Patient Vitals for the past 24 hrs:  BP Temp Pulse Resp SpO2  05/13/21 1628 118/69 98.1 F (36.7 C) 68 18 91 %  05/13/21 0837 125/62 98.5 F (36.9 C) 75 18 91 %  .  Recent laboratory studies: No results found.  Discharge Medications:   Allergies as of 05/14/2021       Reactions   Ceftriaxone    Other reaction(s): diarrhea   Codeine Other (See Comments)   Told to keep on the list    Food    MSG and Lactose (cannot have any dairy products)   Milk-related Compounds    Other reaction(s): diarrhea   Monosodium Glutamate    Other reaction(s): diarrhea   Other Nausea And Vomiting   MSG and Lactose Intolerant Other reaction(s): diarrhea Other reaction(s): hives Other reaction(s): pass out   Propranolol Hcl    Other reaction(s): fainting   Soybean-containing Drug Products Diarrhea        Medication List      STOP taking these medications    acetaminophen 500 MG tablet Commonly known as: TYLENOL       TAKE these medications    albuterol 108 (90 Base) MCG/ACT inhaler Commonly known as: VENTOLIN HFA Inhale 1 puff into the lungs every 4 (four) hours as needed for wheezing or shortness of breath.   aspirin 81 MG chewable tablet Chew 81 mg by mouth daily.   cycloSPORINE 0.05 % ophthalmic emulsion Commonly known as: RESTASIS Place 1 drop into both eyes 2 (two) times daily.   Dexcom G6 Sensor Misc SMARTSIG:1 Each Topical Every 10 Days   Dexcom G6 Transmitter Misc   diphenhydrAMINE 25 MG tablet Commonly known as: BENADRYL Take 25 mg by mouth 2 (two) times daily.   Dulera 100-5 MCG/ACT Aero Generic drug: mometasone-formoterol Inhale 2 puffs into the lungs 2 (two) times daily.   escitalopram 10 MG tablet Commonly known as: LEXAPRO Take 10 mg by mouth at bedtime.   estradiol 2 MG tablet Commonly known as: ESTRACE Take 2 mg by mouth daily.   famotidine 10 MG tablet Commonly known as: PEPCID Take 10 mg by mouth 2 (two) times daily.   GAS-X EXTRA STRENGTH PO Take 1 tablet by mouth daily as needed (gas relief).   hydrocortisone 2.5 % cream Apply 1 application topically daily as needed (  itching).   hydrocortisone 2.5 % rectal cream Commonly known as: ANUSOL-HC Place 1 application rectally 2 (two) times daily as needed for hemorrhoids or anal itching.   insulin lispro 100 UNIT/ML injection Commonly known as: HUMALOG Has with insulin pump   insulin pump Soln Inject 1 each into the skin See admin instructions. Insulin pump MEDTRONIC with humalog (Will be changing to TANDEM).Works with DEXCOM 4 CGM   lansoprazole 30 MG capsule Commonly known as: PREVACID Take 60 mg by mouth 2 (two) times daily before a meal.   levocetirizine 5 MG tablet Commonly known as: XYZAL Take 5 mg by mouth 2 (two) times daily.   levothyroxine 112 MCG tablet Commonly known as: SYNTHROID Take  112 mcg by mouth daily.   loperamide 2 MG tablet Commonly known as: IMODIUM A-D Take 2 mg by mouth 4 (four) times daily as needed for diarrhea or loose stools.   montelukast 10 MG tablet Commonly known as: SINGULAIR Take 10 mg by mouth at bedtime.   MULTIVITAMIN GUMMIES ADULT PO Take 2 each by mouth daily.   Nasonex 50 MCG/ACT nasal spray Generic drug: mometasone Place 2 sprays into the nose daily. OTC   oxyCODONE 5 MG immediate release tablet Commonly known as: Oxy IR/ROXICODONE Take 1-2 tablets (5-10 mg total) by mouth every 4 (four) hours as needed for moderate pain (pain score 4-6).   PANCREAZE PO Take 3-4 capsules by mouth daily as needed (diarrhea). Take 4 capsules with a meal and 3 capsules with a snack   REFRESH OPTIVE OP Place 1 drop into both eyes daily as needed (dry eyes).   rosuvastatin 40 MG tablet Commonly known as: CRESTOR Take 40 mg by mouth every evening.       ASK your doctor about these medications    ammonium lactate 12 % lotion Commonly known as: AmLactin Apply 1 application topically as needed for dry skin.   gabapentin 300 MG capsule Commonly known as: NEURONTIN Take 1 capsule (300 mg total) by mouth 3 (three) times daily.   lamoTRIgine 150 MG tablet Commonly known as: LAMICTAL TAKE 1 TABLET BY MOUTH TWICE DAILY   primidone 50 MG tablet Commonly known as: MYSOLINE TAKE 1 TABLET BY MOUTH IN THE MORNING , 1 TABLET AT NOON AND ONE AND ONE-HALF (1 & 1/2) TABLETS IN THE EVENING        Diagnostic Studies: Color Fundus Photography Optos - OU - Both Eyes  Result Date: 04/28/2021 Right Eye Progression has worsened. Disc findings include normal observations. Vessels : Neovascularization. Notes Moderate scattered preretinal and vitreous hemorrhage noted particularly nasal to the nerve OD.  Room for more laser PRP posterior to the equator inferonasal OS with quiescent proliferative diabetic retinopathy in excellent PRP anterior and posterior to  the equator 360   Patient benefited maximally from their hospital stay and there were no complications.     Disposition: Discharge disposition: 03-Skilled Nursing Facility      Discharge Instructions     Apply dressing   Complete by: As directed    Remove Ace wrap 6/17. Cleanse Stump daily with antibacterial soap and water. Apply black shrinker directly to skin do not put any dressings beneath as shrinker is medicated. Change out to clean shrinker daily and wash dirty shrinker   Call MD / Call 911   Complete by: As directed    If you experience chest pain or shortness of breath, CALL 911 and be transported to the hospital emergency room.  If you develope a  fever above 101 F, pus (white drainage) or increased drainage or redness at the wound, or calf pain, call your surgeon's office.   Call MD / Call 911   Complete by: As directed    If you experience chest pain or shortness of breath, CALL 911 and be transported to the hospital emergency room.  If you develope a fever above 101 F, pus (white drainage) or increased drainage or redness at the wound, or calf pain, call your surgeon's office.   Constipation Prevention   Complete by: As directed    Drink plenty of fluids.  Prune juice may be helpful.  You may use a stool softener, such as Colace (over the counter) 100 mg twice a day.  Use MiraLax (over the counter) for constipation as needed.   Constipation Prevention   Complete by: As directed    Drink plenty of fluids.  Prune juice may be helpful.  You may use a stool softener, such as Colace (over the counter) 100 mg twice a day.  Use MiraLax (over the counter) for constipation as needed.   Diet - low sodium heart healthy   Complete by: As directed    Diet - low sodium heart healthy   Complete by: As directed    Discharge instructions   Complete by: As directed    Call office if preveena pump alarms   Increase activity slowly as tolerated   Complete by: As directed    Increase  activity slowly as tolerated   Complete by: As directed    Post-operative opioid taper instructions:   Complete by: As directed    POST-OPERATIVE OPIOID TAPER INSTRUCTIONS: It is important to wean off of your opioid medication as soon as possible. If you do not need pain medication after your surgery it is ok to stop day one. Opioids include: Codeine, Hydrocodone(Norco, Vicodin), Oxycodone(Percocet, oxycontin) and hydromorphone amongst others.  Long term and even short term use of opiods can cause: Increased pain response Dependence Constipation Depression Respiratory depression And more.  Withdrawal symptoms can include Flu like symptoms Nausea, vomiting And more Techniques to manage these symptoms Hydrate well Eat regular healthy meals Stay active Use relaxation techniques(deep breathing, meditating, yoga) Do Not substitute Alcohol to help with tapering If you have been on opioids for less than two weeks and do not have pain than it is ok to stop all together.  Plan to wean off of opioids This plan should start within one week post op of your joint replacement. Maintain the same interval or time between taking each dose and first decrease the dose.  Cut the total daily intake of opioids by one tablet each day Next start to increase the time between doses. The last dose that should be eliminated is the evening dose.      Post-operative opioid taper instructions:   Complete by: As directed    POST-OPERATIVE OPIOID TAPER INSTRUCTIONS: It is important to wean off of your opioid medication as soon as possible. If you do not need pain medication after your surgery it is ok to stop day one. Opioids include: Codeine, Hydrocodone(Norco, Vicodin), Oxycodone(Percocet, oxycontin) and hydromorphone amongst others.  Long term and even short term use of opiods can cause: Increased pain response Dependence Constipation Depression Respiratory depression And more.  Withdrawal symptoms  can include Flu like symptoms Nausea, vomiting And more Techniques to manage these symptoms Hydrate well Eat regular healthy meals Stay active Use relaxation techniques(deep breathing, meditating, yoga) Do Not substitute Alcohol to  help with tapering If you have been on opioids for less than two weeks and do not have pain than it is ok to stop all together.  Plan to wean off of opioids This plan should start within one week post op of your joint replacement. Maintain the same interval or time between taking each dose and first decrease the dose.  Cut the total daily intake of opioids by one tablet each day Next start to increase the time between doses. The last dose that should be eliminated is the evening dose.          Follow-up Information     Tiaunna Buford, [place], [place] Follow up in 1 week(s).   Specialty: Orthopedic Surgery Contact information: [address] [place] [address] [phone]         Care, [facility] Follow up.   Specialty: Home Health Services Why: home health services will be provided by [facility], PT,OT. Start of care within 48 hours post discharge Contact information: [address] [place] [address] [phone]                  Signed: Haizel Gatchell Taegan Standage 05/14/2021, 7:22 AM

## 2021-05-14 NOTE — TOC Progression Note (Addendum)
Transition of Care [facility]) - Progression Note    Patient Details  Name: [name] MRN: [id number] Date of Birth: 07-12-1961  Transition of [facility]) CM/SW Contact  Epifanio Lesches, RN Phone Number: 05/14/2021, 12:52 PM  Clinical Narrative:    Pt/ husband accepted bed offer from [facility]. Facility has initiated English as a second language teacher...SNF authorization pending  TOC  team will continue to monitor and assist with need.....   6/17 @ 1434 NCM f/u with [facility]/Teresa regarding status of SNF authorization.  Rosey Bath stated authorization is still under review.... authorization pending  Expected Discharge Plan: Skilled Nursing Facility Barriers to Discharge: Insurance Authorization  Expected Discharge Plan and Services Expected Discharge Plan: Skilled Nursing Facility     Post Acute Care Choice: Home Health Living arrangements for the past 2 months: Single Family Home Expected Discharge Date: 05/14/21                         HH Arranged: PT, OT HH Agency: [facility] Date [facility] Agency Contacted: 05/11/21 Time HH Agency Contacted: [phone] Representative spoke with at [facility] Agency: Kandee Keen   Social Determinants of Health (SDOH) Interventions    Readmission Risk Interventions No flowsheet data found.

## 2021-05-14 NOTE — Progress Notes (Signed)
Physical Therapy Treatment Patient Details Name: [name] MRN: [id number] DOB: [birth year] Today's Date: 05/14/2021    History of Present Illness 60 y.o. 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 PMH consists of DM1, peripheral neuropathy, gastroparesis, and retinopathy.    PT Comments    The pt was seen for continued progression of exercise and OOB transfer training. The pt remains highly motivated this session, and was able to complete x3 sit-stand transfers with minA and RW. The pt requires min-modA to steady and fatigues quickly. The pt completed multiple exercises for strength and stability in LLE, as well as AROM and strength in R hip. After 3rd sit-stand the pt reports significant dizziness, BP 114/53 with return to supine. Pt given handout on counter-pressure maneuvers. Will continue to benefit from skilled PT acutely and following d/c to maximize return to independence.     Follow Up Recommendations  SNF     Equipment Recommendations  None recommended by PT (will need drop arm BSC)    Recommendations for Other Services       Precautions / Restrictions Precautions Precautions: Fall;Other (comment) Precaution Comments: orthostatic Required Braces or Orthoses: Other Brace Other Brace: R shrinker and limb protector, L AFO Restrictions Weight Bearing Restrictions: Yes RLE Weight Bearing: Non weight bearing    Mobility  Bed Mobility Overal bed mobility: Needs Assistance Bed Mobility: Rolling;Sidelying to Sit;Sit to Supine Rolling: Supervision Sidelying to sit: Supervision Supine to sit: Supervision     General bed mobility comments: no physical assist to come to sitting EOB, slight use of bed rails. assist given at one time due to pt with significant dizziness and presyncope    Transfers Overall transfer level: Needs assistance Equipment used: Rolling walker (2 wheeled) Transfers: Sit to/from Stand Sit to Stand: Min assist          General transfer comment: minA to power up from EOB with RW. completed x3  Ambulation/Gait             General Gait Details: pt unable to generate steps with LLE at this time       Balance Overall balance assessment: Needs assistance Sitting-balance support: Feet supported;No upper extremity supported Sitting balance-Leahy Scale: Fair     Standing balance support: Bilateral upper extremity supported;During functional activity Standing balance-Leahy Scale: Poor Standing balance comment: BUE reliant on RW                            Cognition Arousal/Alertness: Awake/alert Behavior During Therapy: WFL for tasks assessed/performed Overall Cognitive Status: Impaired/Different from baseline Area of Impairment: Safety/judgement;Awareness;Problem solving                         Safety/Judgement: Decreased awareness of safety Awareness: Emergent Problem Solving: Difficulty sequencing;Requires verbal cues General Comments: pt very motivated      Exercises Amputee Exercises Hip Extension: AROM;Right;10 reps;Sidelying Hip ABduction/ADduction: AROM;Right;10 reps;Sidelying Other Exercises Other Exercises: standing "pushup" on RW x10 Other Exercises: counter pressure arm tensing technique (no resolution of sx)    General Comments General comments (skin integrity, edema, etc.): pt dizzy with repeated sit-stand attempts and exercise, returned to supine x2 through session due to sx. BP 114/54 (73) in supine at end of session. pt educated in counter pressure measures for syncope      Pertinent Vitals/Pain Pain Assessment: Faces Pain Score: 0-No pain Faces Pain Scale: Hurts a little bit  Pain Location: R residual limb Pain Descriptors / Indicators:  (claws into the limb) Pain Intervention(s): Monitored during session     PT Goals (current goals can now be found in the care plan section) Acute Rehab PT Goals Patient Stated Goal: determine need for  rehab PT Goal Formulation: With patient Time For Goal Achievement: 05/23/21 Potential to Achieve Goals: Good Progress towards PT goals: Progressing toward goals    Frequency    Min 3X/week      PT Plan Current plan remains appropriate       AM-PAC PT "6 Clicks" Mobility   Outcome Measure  Help needed turning from your back to your side while in a flat bed without using bedrails?: A Little Help needed moving from lying on your back to sitting on the side of a flat bed without using bedrails?: A Little Help needed moving to and from a bed to a chair (including a wheelchair)?: Total Help needed standing up from a chair using your arms (e.g., wheelchair or bedside chair)?: A Lot Help needed to walk in hospital room?: Total Help needed climbing 3-5 steps with a railing? : Total 6 Click Score: 11    End of Session Equipment Utilized During Treatment: Gait belt Activity Tolerance: Other (comment);Treatment limited secondary to medical complications (Comment);Patient tolerated treatment well (dizziness) Patient left: in bed;with call bell/phone within reach;with bed alarm set (HOB at 60 deg) Nurse Communication: Mobility status PT Visit Diagnosis: Other abnormalities of gait and mobility (R26.89) Pain - Right/Left: Right Pain - part of body: Leg     Time: 0165-5374 PT Time Calculation (min) (ACUTE ONLY): 47 min  Charges:  $Therapeutic Exercise: 23-37 mins $Therapeutic Activity: 8-22 mins                     Rolm Baptise, PT, DPT   Acute Rehabilitation Department Pager #: [phone]   Gaetana Michaelis 05/14/2021, 9:53 AM

## 2021-05-14 NOTE — Progress Notes (Signed)
Patient is doing well scheduled to discharge to nursing facility today  Vital signs stable afebrile her wound VAC has now been on for 7 days.  There is been no drainage.  I elected to remove the VAC and apply clean dry dressing incision has well apposed wound edges no erythema minimal bloody drainage swelling is well controlled have written orders for stump care for nursing facility needs follow-up in our office in 1 week

## 2021-05-14 NOTE — Plan of Care (Signed)
  Problem: Health Behavior/Discharge Planning: Goal: Ability to manage health-related needs will improve Outcome: Adequate for Discharge   Problem: Clinical Measurements: Goal: Ability to maintain clinical measurements within normal limits will improve Outcome: Adequate for Discharge Goal: Will remain free from infection Outcome: Adequate for Discharge   

## 2021-05-15 LAB — GLUCOSE, CAPILLARY
Glucose-Capillary: 119 mg/dL — ABNORMAL HIGH (ref 70–99)
Glucose-Capillary: 116 mg/dL — ABNORMAL HIGH (ref 70–99)
Glucose-Capillary: 135 mg/dL — ABNORMAL HIGH (ref 70–99)
Glucose-Capillary: 172 mg/dL — ABNORMAL HIGH (ref 70–99)

## 2021-05-15 NOTE — Progress Notes (Signed)
Patient is 1 week status post below-knee amputation.  She says she is doing better with therapy.  Vital signs stable afebrile she is alert and comfortable in bed.  She is wondering if she has to wear her protector in bed and I told her no only when she is up and out of bed.  Awaiting nursing home placement.  Apparently offers were made and somewhat withdrawn.  This may be secondary to her vaccine status but will follow-up with social worker hopefully discharge today she has tested twice negative for COVID

## 2021-05-15 NOTE — Progress Notes (Signed)
Report called to Marylene Land, RN at [facility].

## 2021-05-15 NOTE — TOC Transition Note (Signed)
Transition of Care [facility]) - CM/SW Discharge Note   Patient Details  Name: [name] MRN: [id number] Date of Birth: 04/20/1961  Transition of Care [facility]) CM/SW Contact:  Epifanio Lesches, RN Phone Number: 05/15/2021, 3:14 PM   Clinical Narrative:    Patient will DC to: [facility] Anticipated DC date: 05/15/2021 Family notified: yes, husband Transport by: Sharin Mons  Authorization for SNF placement received. Per MD patient ready for DC today. RN, patient, patient's family, and facility notified of DC. Discharge Summary and FL2 sent to facility. RN to call report prior to discharge [phone]). DC packet on chart. Ambulance transport requested for patient.   Berdie Ogren (Spouse)        [phone]      RNCM will sign off for now as intervention is no longer needed. Please consult [place] again if new needs arise.    Final next level of care: Skilled Nursing Facility Barriers to Discharge: No Barriers Identified   Patient Goals and CMS Choice     Choice offered to / list presented to : Patient  Discharge Placement                       Discharge Plan and Services     Post Acute Care Choice: Home Health                    HH Arranged: PT, OT [facility] Agency: [facility] Date [facility] Agency Contacted: 05/11/21 Time HH Agency Contacted: [phone] Representative spoke with at [facility] Agency: Kandee Keen  Social Determinants of Health (SDOH) Interventions     Readmission Risk Interventions No flowsheet data found.

## 2021-05-22 ENCOUNTER — Encounter: Payer: Self-pay | Admitting: Physician Assistant

## 2021-05-22 ENCOUNTER — Ambulatory Visit (INDEPENDENT_AMBULATORY_CARE_PROVIDER_SITE_OTHER): Payer: Managed Care, Other (non HMO) | Admitting: Physician Assistant

## 2021-05-22 ENCOUNTER — Other Ambulatory Visit: Payer: Self-pay

## 2021-05-22 DIAGNOSIS — M14671 Charcot's joint, right ankle and foot: Secondary | ICD-10-CM

## 2021-05-22 NOTE — Progress Notes (Signed)
Office Visit Note   Patient: [name]           Date of Birth: [birth year]           MRN: [id number] Visit Date: 05/22/2021              Requested by: Soundra Pilon, [name] [address] [place],  [place] [address] PCP: Soundra Pilon, [name]  Chief Complaint  Patient presents with   Right Ankle - Pain      HPI: Patient presents today she is 2 weeks status post below-knee amputation.  She just was released from skilled nursing.  Assessment & Plan: Visit Diagnoses: No diagnosis found.  Plan: Continue daily dry dressing changes and cleansing.  Have given him prescription for smaller shrinkers will follow-up in 1 week.  Follow-Up Instructions: No follow-ups on file.   Ortho Exam  Patient is alert, oriented, no adenopathy, well-dressed, normal affect, normal respiratory effort. Well-healing incision.  Well apposed wound edges minimal bloody drainage no ascending cellulitis or signs of infection she does come out to full extension  Imaging: No results found.   Labs: Lab Results  Component Value Date   HGBA1C 7.0 (H) 05/08/2021   HGBA1C 5.0 06/10/2020   ESRSEDRATE 65 (H) 06/10/2020   CRP 6.3 (H) 06/10/2020   REPTSTATUS 06/15/2020 FINAL 06/10/2020   CULT  06/10/2020    NO GROWTH 5 DAYS Performed at [facility], [address] [place], [place] [address]      Lab Results  Component Value Date   ALBUMIN 3.4 (L) 06/13/2020   ALBUMIN 3.2 (L) 06/12/2020   ALBUMIN 3.0 (L) 06/11/2020   PREALBUMIN 12.6 (L) 06/10/2020    Lab Results  Component Value Date   MG 2.4 06/13/2020   MG 2.3 06/12/2020   No results found for: [facility]  Lab Results  Component Value Date   PREALBUMIN 12.6 (L) 06/10/2020   CBC EXTENDED Latest Ref Rng & Units 05/10/2021 05/09/2021 05/08/2021  WBC 4.0 - 10.5 K/uL 11.1(H) 13.3(H) 11.2(H)  RBC 3.87 - 5.11 MIL/uL 3.74(L) 3.76(L) 4.08  HGB 12.0 - 15.0 g/dL 11.3(L) 11.5(L) 12.4  HCT 36.0 - 46.0 % 36.7 37.0 40.0  PLT 150 - 400 K/uL 244 243  255  NEUTROABS 1.7 - 7.7 K/uL - - -  LYMPHSABS 0.7 - 4.0 K/uL - - -     There is no height or weight on file to calculate BMI.  Orders:  No orders of the defined types were placed in this encounter.  No orders of the defined types were placed in this encounter.    Procedures: No procedures performed  Clinical Data: No additional findings.  ROS:  All other systems negative, except as noted in the HPI. Review of Systems  Objective: Vital Signs: There were no vitals taken for this visit.  Specialty Comments:  No specialty comments available.  PMFS History: Patient Active Problem List   Diagnosis Date Noted   Foot pain, right 05/08/2021   Charcot's joint, right ankle and foot    Allergic rhinitis 03/17/2021   Anxiety 03/17/2021   Diabetic polyneuropathy (HCC) 03/17/2021   External hemorrhoids 03/17/2021   History of anemia 03/17/2021   Hyperglycemia due to type 1 diabetes mellitus (HCC) 03/17/2021   Long term (current) use of insulin (HCC) 03/17/2021   Personal history of other diseases of the respiratory system 03/17/2021   Recurrent major depression (HCC) 03/17/2021   Proliferative diabetic retinopathy of right eye without macular edema  associated with type 1 diabetes mellitus (HCC) 12/25/2020   Stable treated proliferative diabetic retinopathy of left eye with macular edema determined by examination associated with type 1 diabetes mellitus (HCC) 12/23/2020   Vitreous hemorrhage, right eye (HCC) 12/23/2020   Cellulitis 06/11/2020   Diabetic foot infection (HCC) 06/10/2020   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 03/05/2019   Special screening for malignant neoplasms, colon 01/24/2017   Difficulty in swallowing 01/24/2017   Esophageal reflux 01/24/2017   DM (diabetes mellitus) type 1 with ketoacidosis (HCC) 03/01/2015   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) 03/01/2015   Diabetic neuropathy (HCC) 03/01/2015   Hypothyroidism  03/01/2015   Depression 03/01/2015   Orthostatic hypotension 03/01/2015   Essential tremor 03/01/2015   Diabetic retinopathy (HCC) 03/01/2015   Gastroparesis due to DM (HCC) 03/01/2015   Chronic renal insufficiency 03/01/2015   Foot drop, bilateral 03/01/2015   HLD (hyperlipidemia) 03/01/2015   Obstructive sleep apnea 03/01/2015   Past Medical History:  Diagnosis Date   Adhesive capsulitis 2013   Bilateral shoulders   Adhesive capsulitis of both shoulders 2013   Anemia    Cataract    Chronic kidney disease    Depression    Diabetes mellitus without complication (HCC)    Diabetic retinopathy (HCC) 2010   Diabetic retinopathy (HCC) 2010   Left eye, mild   Essential tremor 2011   Bilateral hands   Foot drop, bilateral 2007   Foot fracture, left    Gastroparesis 2009   GERD (gastroesophageal reflux disease)    Headache    Hemorrhage 2011   Laser surgery x2   High potassium 2012   Normal as of 2014   Hypercholesterolemia 2010   Hyperlipidemia    Neuromuscular disorder (HCC)    patient states that she has a body twitch.   Pneumonia    Postnasal drip 2010   Scratched cornea 2013   Left eye   Sleep apnea    does not wear CPAP   Stage 3 chronic kidney disease (HCC)    Thyroid disease    TMJ (temporomandibular joint disorder)    Type 1 diabetes (HCC)     Family History  Problem Relation Age of Onset   Hypertension Mother    Diabetes Father    COPD Father    Pneumonia Father    Stroke Maternal Grandmother    Hypertension Maternal Grandmother    Diabetes Maternal Grandfather    Parkinson's disease Maternal Grandfather    Heart disease Maternal Grandfather    Parkinsonism Neg Hx    Neuropathy Neg Hx    Breast cancer Neg Hx     Past Surgical History:  Procedure Laterality Date   ABDOMINAL HYSTERECTOMY  2007   AMPUTATION Right 05/08/2021   Procedure: RIGHT BELOW KNEE AMPUTATION;  Surgeon: [name], MD;  Location: [facility] OR;  Service: Orthopedics;  Laterality:  Right;   APPENDECTOMY     BALLOON DILATION N/A 01/24/2017   Procedure: BALLOON DILATION;  Surgeon: Charlott Rakes, MD;  Location: [facility];  Service: Endoscopy;  Laterality: N/A;   CARPAL TUNNEL RELEASE Right 1990   CATARACT EXTRACTION Bilateral 2008   COLONOSCOPY  2007   COLONOSCOPY WITH PROPOFOL N/A 01/24/2017   Procedure: COLONOSCOPY WITH PROPOFOL;  Surgeon: Charlott Rakes, MD;  Location: [facility];  Service: Endoscopy;  Laterality: N/A;   ESOPHAGOGASTRODUODENOSCOPY (EGD) WITH PROPOFOL N/A 01/24/2017   Procedure: ESOPHAGOGASTRODUODENOSCOPY (EGD) WITH PROPOFOL;  Surgeon: Charlott Rakes, MD;  Location: [facility];  Service: Endoscopy;  Laterality: N/A;  moved oer MD request   EYE SURGERY Left 2012   Pars plana viterctomy, membrane peel, endolaser   ROOT CANAL  2007   ROOT CANAL  2010   Thorat dilation  2007   Throat dilation  2007   Throat dilation  2010, 0ct 2011   Sep 2010, Nov 2010, 2014   TONSILLECTOMY  1971   Trigger fingers  2008   WISDOM TOOTH EXTRACTION  1977   Social History   Occupational History   Not on file  Tobacco Use   Smoking status: Never   Smokeless tobacco: Never  Vaping Use   Vaping Use: Never used  Substance and Sexual Activity   Alcohol use: No    Alcohol/week: 0.0 standard drinks   Drug use: Yes   Sexual activity: Not on file

## 2021-05-25 ENCOUNTER — Other Ambulatory Visit: Payer: Self-pay | Admitting: Physician Assistant

## 2021-05-25 ENCOUNTER — Telehealth: Payer: Self-pay | Admitting: Physician Assistant

## 2021-05-25 MED ORDER — OXYCODONE HCL 5 MG PO TABS: 5.0000 mg | ORAL_TABLET | ORAL | 0 refills | Status: AC | PRN

## 2021-05-25 NOTE — Telephone Encounter (Signed)
Pt Case manager Vincenza Hews called and pt only got 6 oxycodone pills when she went to the pharmacy and she is waiting until she is almost in tears before she takes another one. The pt would like to know if she could get a refill. She would like a call when it is done  please.   CB [phone]  Jimmy Footman [phone] ext [phone]

## 2021-05-25 NOTE — Telephone Encounter (Signed)
I called and sw pt's husband to advise that this has been done.

## 2021-05-25 NOTE — Telephone Encounter (Signed)
refilled 

## 2021-05-25 NOTE — Telephone Encounter (Signed)
Pt is s/p BKA last refill was 05/22/21 #6 please see below and advise.

## 2021-05-29 ENCOUNTER — Ambulatory Visit (INDEPENDENT_AMBULATORY_CARE_PROVIDER_SITE_OTHER): Payer: Managed Care, Other (non HMO) | Admitting: Physician Assistant

## 2021-05-29 ENCOUNTER — Encounter: Payer: Self-pay | Admitting: Physician Assistant

## 2021-05-29 DIAGNOSIS — M14671 Charcot's joint, right ankle and foot: Secondary | ICD-10-CM

## 2021-05-29 NOTE — Progress Notes (Signed)
Office Visit Note   Patient: [name]           Date of Birth: [birth year]           MRN: [id number] Visit Date: 05/29/2021              Requested by: Soundra Pilon, [name] [address] [place],  [place] [address] PCP: Soundra Pilon, [name]  Chief Complaint  Patient presents with   Right Leg - Routine Post Op    05/08/21 right BKA       HPI: Patient presents today she is 3 weeks status post right below-knee amputation.  She was seen by her primary care doctor today who had some concerns that with there is some redness around the incision.  She is doing well and her pain is well controlled  Assessment & Plan: Visit Diagnoses: No diagnosis found.  Plan: Patient is doing well no indications of infection she does have irritation around the areas of the staples these were removed today without difficulty.  Follow-up in 2 weeks have provided them with a prescription for her prosthetic  Follow-Up Instructions: No follow-ups on file.   Ortho Exam  Patient is alert, oriented, no adenopathy, well-dressed, normal affect, normal respiratory effort. Well apposed wound edges.  There is no dehiscence.  Very very small amount of superficial necrosis that is only 2 cm and is very superficial.  Surgical staples are in place with some irritation around the staples but not on the incision.  These were removed.  There was a small Vicryl suture that became prominent that I cut back.  Swelling is well controlled no ascending cellulitis or signs of infection  Imaging: No results found. No images are attached to the encounter.  Labs: Lab Results  Component Value Date   HGBA1C 7.0 (H) 05/08/2021   HGBA1C 5.0 06/10/2020   ESRSEDRATE 65 (H) 06/10/2020   CRP 6.3 (H) 06/10/2020   REPTSTATUS 06/15/2020 FINAL 06/10/2020   CULT  06/10/2020    NO GROWTH 5 DAYS Performed at [facility], [address] [place], [place] [address]      Lab Results  Component Value Date   ALBUMIN 3.4 (L)  06/13/2020   ALBUMIN 3.2 (L) 06/12/2020   ALBUMIN 3.0 (L) 06/11/2020   PREALBUMIN 12.6 (L) 06/10/2020    Lab Results  Component Value Date   MG 2.4 06/13/2020   MG 2.3 06/12/2020   No results found for: [facility]  Lab Results  Component Value Date   PREALBUMIN 12.6 (L) 06/10/2020   CBC EXTENDED Latest Ref Rng & Units 05/10/2021 05/09/2021 05/08/2021  WBC 4.0 - 10.5 K/uL 11.1(H) 13.3(H) 11.2(H)  RBC 3.87 - 5.11 MIL/uL 3.74(L) 3.76(L) 4.08  HGB 12.0 - 15.0 g/dL 11.3(L) 11.5(L) 12.4  HCT 36.0 - 46.0 % 36.7 37.0 40.0  PLT 150 - 400 K/uL 244 243 255  NEUTROABS 1.7 - 7.7 K/uL - - -  LYMPHSABS 0.7 - 4.0 K/uL - - -     There is no height or weight on file to calculate BMI.  Orders:  No orders of the defined types were placed in this encounter.  No orders of the defined types were placed in this encounter.    Procedures: No procedures performed  Clinical Data: No additional findings.  ROS:  All other systems negative, except as noted in the HPI. Review of Systems  Objective: Vital Signs: There were no vitals taken for this visit.  Specialty Comments:  No specialty comments available.  PMFS History: Patient Active Problem List   Diagnosis Date Noted   Foot pain, right 05/08/2021   Charcot's joint, right ankle and foot    Allergic rhinitis 03/17/2021   Anxiety 03/17/2021   Diabetic polyneuropathy (HCC) 03/17/2021   External hemorrhoids 03/17/2021   History of anemia 03/17/2021   Hyperglycemia due to type 1 diabetes mellitus (HCC) 03/17/2021   Long term (current) use of insulin (HCC) 03/17/2021   Personal history of other diseases of the respiratory system 03/17/2021   Recurrent major depression (HCC) 03/17/2021   Proliferative diabetic retinopathy of right eye without macular edema associated with type 1 diabetes mellitus (HCC) 12/25/2020   Stable treated proliferative diabetic retinopathy of left eye with macular edema determined by examination associated with type  1 diabetes mellitus (HCC) 12/23/2020   Vitreous hemorrhage, right eye (HCC) 12/23/2020   Cellulitis 06/11/2020   Diabetic foot infection (HCC) 06/10/2020   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 03/05/2019   Special screening for malignant neoplasms, colon 01/24/2017   Difficulty in swallowing 01/24/2017   Esophageal reflux 01/24/2017   DM (diabetes mellitus) type 1 with ketoacidosis (HCC) 03/01/2015   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) 03/01/2015   Diabetic neuropathy (HCC) 03/01/2015   Hypothyroidism 03/01/2015   Depression 03/01/2015   Orthostatic hypotension 03/01/2015   Essential tremor 03/01/2015   Diabetic retinopathy (HCC) 03/01/2015   Gastroparesis due to DM (HCC) 03/01/2015   Chronic renal insufficiency 03/01/2015   Foot drop, bilateral 03/01/2015   HLD (hyperlipidemia) 03/01/2015   Obstructive sleep apnea 03/01/2015   Past Medical History:  Diagnosis Date   Adhesive capsulitis 2013   Bilateral shoulders   Adhesive capsulitis of both shoulders 2013   Anemia    Cataract    Chronic kidney disease    Depression    Diabetes mellitus without complication (HCC)    Diabetic retinopathy (HCC) 2010   Diabetic retinopathy (HCC) 2010   Left eye, mild   Essential tremor 2011   Bilateral hands   Foot drop, bilateral 2007   Foot fracture, left    Gastroparesis 2009   GERD (gastroesophageal reflux disease)    Headache    Hemorrhage 2011   Laser surgery x2   High potassium 2012   Normal as of 2014   Hypercholesterolemia 2010   Hyperlipidemia    Neuromuscular disorder (HCC)    patient states that she has a body twitch.   Pneumonia    Postnasal drip 2010   Scratched cornea 2013   Left eye   Sleep apnea    does not wear CPAP   Stage 3 chronic kidney disease (HCC)    Thyroid disease    TMJ (temporomandibular joint disorder)    Type 1 diabetes (HCC)     Family History  Problem Relation Age of Onset   Hypertension Mother     Diabetes Father    COPD Father    Pneumonia Father    Stroke Maternal Grandmother    Hypertension Maternal Grandmother    Diabetes Maternal Grandfather    Parkinson's disease Maternal Grandfather    Heart disease Maternal Grandfather    Parkinsonism Neg Hx    Neuropathy Neg Hx    Breast cancer Neg Hx     Past Surgical History:  Procedure Laterality Date   ABDOMINAL HYSTERECTOMY  2007   AMPUTATION Right 05/08/2021   Procedure: RIGHT BELOW KNEE AMPUTATION;  Surgeon: [name], MD;  Location: [facility] OR;  Service:  Orthopedics;  Laterality: Right;   APPENDECTOMY     BALLOON DILATION N/A 01/24/2017   Procedure: BALLOON DILATION;  Surgeon: Charlott Rakes, MD;  Location: [facility];  Service: Endoscopy;  Laterality: N/A;   CARPAL TUNNEL RELEASE Right 1990   CATARACT EXTRACTION Bilateral 2008   COLONOSCOPY  2007   COLONOSCOPY WITH PROPOFOL N/A 01/24/2017   Procedure: COLONOSCOPY WITH PROPOFOL;  Surgeon: Charlott Rakes, MD;  Location: [facility];  Service: Endoscopy;  Laterality: N/A;   ESOPHAGOGASTRODUODENOSCOPY (EGD) WITH PROPOFOL N/A 01/24/2017   Procedure: ESOPHAGOGASTRODUODENOSCOPY (EGD) WITH PROPOFOL;  Surgeon: Charlott Rakes, MD;  Location: [facility];  Service: Endoscopy;  Laterality: N/A;  moved oer MD request   EYE SURGERY Left 2012   Pars plana viterctomy, membrane peel, endolaser   ROOT CANAL  2007   ROOT CANAL  2010   Thorat dilation  2007   Throat dilation  2007   Throat dilation  2010, 0ct 2011   Sep 2010, Nov 2010, 2014   TONSILLECTOMY  1971   Trigger fingers  2008   WISDOM TOOTH EXTRACTION  1977   Social History   Occupational History   Not on file  Tobacco Use   Smoking status: Never   Smokeless tobacco: Never  Vaping Use   Vaping Use: Never used  Substance and Sexual Activity   Alcohol use: No    Alcohol/week: 0.0 standard drinks   Drug use: Yes   Sexual activity: Not on file

## 2021-06-12 ENCOUNTER — Encounter: Payer: Self-pay | Admitting: Physician Assistant

## 2021-06-12 ENCOUNTER — Ambulatory Visit (INDEPENDENT_AMBULATORY_CARE_PROVIDER_SITE_OTHER): Payer: Managed Care, Other (non HMO) | Admitting: Physician Assistant

## 2021-06-12 DIAGNOSIS — Z89511 Acquired absence of right leg below knee: Secondary | ICD-10-CM

## 2021-06-12 DIAGNOSIS — S88111A Complete traumatic amputation at level between knee and ankle, right lower leg, initial encounter: Secondary | ICD-10-CM

## 2021-06-12 NOTE — Progress Notes (Signed)
Office Visit Note   Patient: [name]           Date of Birth: December 30, 1960           MRN: [id number] Visit Date: 06/12/2021              Requested by: Soundra Pilon, [name] [address] [place],  [place] [address] PCP: Soundra Pilon, [name]  Chief Complaint  Patient presents with   Right Knee - Routine Post Op      HPI: Patient is a pleasant 60 year old woman who is 5 weeks status post right below-knee amputation.  She is doing well and was at [facility] today.  She thinks she will be receiving her prosthetic in the near future.  She is using a shrinker.  Assessment & Plan: Visit Diagnoses:  1. Below-knee amputation of right lower extremity (HCC)     Plan: Referral for physical therapy was done today.  We will follow-up in 1 month.  Doing very well.  Follow-Up Instructions: No follow-ups on file.   Ortho Exam  Patient is alert, oriented, no adenopathy, well-dressed, normal affect, normal respiratory effort. Examination of her right below-knee amputation stump no redness no warmth no cellulitis.  She has 1 area of resolving eschar around the lateral aspect of the amputation stump but there is no surrounding erythema.  She has good range of motion of her knee no sign of infection  Imaging: No results found. No images are attached to the encounter.  Labs: Lab Results  Component Value Date   HGBA1C 7.0 (H) 05/08/2021   HGBA1C 5.0 06/10/2020   ESRSEDRATE 65 (H) 06/10/2020   CRP 6.3 (H) 06/10/2020   REPTSTATUS 06/15/2020 FINAL 06/10/2020   CULT  06/10/2020    NO GROWTH 5 DAYS Performed at [facility], [address], [place], [place] [address]      Lab Results  Component Value Date   ALBUMIN 3.4 (L) 06/13/2020   ALBUMIN 3.2 (L) 06/12/2020   ALBUMIN 3.0 (L) 06/11/2020   PREALBUMIN 12.6 (L) 06/10/2020    Lab Results  Component Value Date   MG 2.4 06/13/2020   MG 2.3 06/12/2020   No results found for: [facility]  Lab Results  Component Value Date    PREALBUMIN 12.6 (L) 06/10/2020   CBC EXTENDED Latest Ref Rng & Units 05/10/2021 05/09/2021 05/08/2021  WBC 4.0 - 10.5 K/uL 11.1(H) 13.3(H) 11.2(H)  RBC 3.87 - 5.11 MIL/uL 3.74(L) 3.76(L) 4.08  HGB 12.0 - 15.0 g/dL 11.3(L) 11.5(L) 12.4  HCT 36.0 - 46.0 % 36.7 37.0 40.0  PLT 150 - 400 K/uL 244 243 255  NEUTROABS 1.7 - 7.7 K/uL - - -  LYMPHSABS 0.7 - 4.0 K/uL - - -     There is no height or weight on file to calculate BMI.  Orders:  Orders Placed This Encounter  Procedures   Ambulatory referral to Physical Therapy   No orders of the defined types were placed in this encounter.    Procedures: No procedures performed  Clinical Data: No additional findings.  ROS:  All other systems negative, except as noted in the HPI. Review of Systems  Objective: Vital Signs: There were no vitals taken for this visit.  Specialty Comments:  No specialty comments available.  PMFS History: Patient Active Problem List   Diagnosis Date Noted   Foot pain, right 05/08/2021   Charcot's joint, right ankle and foot    Allergic rhinitis 03/17/2021   Anxiety 03/17/2021  Diabetic polyneuropathy (HCC) 03/17/2021   External hemorrhoids 03/17/2021   History of anemia 03/17/2021   Hyperglycemia due to type 1 diabetes mellitus (HCC) 03/17/2021   Long term (current) use of insulin (HCC) 03/17/2021   Personal history of other diseases of the respiratory system 03/17/2021   Recurrent major depression (HCC) 03/17/2021   Proliferative diabetic retinopathy of right eye without macular edema associated with type 1 diabetes mellitus (HCC) 12/25/2020   Stable treated proliferative diabetic retinopathy of left eye with macular edema determined by examination associated with type 1 diabetes mellitus (HCC) 12/23/2020   Vitreous hemorrhage, right eye (HCC) 12/23/2020   Cellulitis 06/11/2020   Diabetic foot infection (HCC) 06/10/2020   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 03/05/2019    Special screening for malignant neoplasms, colon 01/24/2017   Difficulty in swallowing 01/24/2017   Esophageal reflux 01/24/2017   DM (diabetes mellitus) type 1 with ketoacidosis (HCC) 03/01/2015   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) 03/01/2015   Diabetic neuropathy (HCC) 03/01/2015   Hypothyroidism 03/01/2015   Depression 03/01/2015   Orthostatic hypotension 03/01/2015   Essential tremor 03/01/2015   Diabetic retinopathy (HCC) 03/01/2015   Gastroparesis due to DM (HCC) 03/01/2015   Chronic renal insufficiency 03/01/2015   Foot drop, bilateral 03/01/2015   HLD (hyperlipidemia) 03/01/2015   Obstructive sleep apnea 03/01/2015   Past Medical History:  Diagnosis Date   Adhesive capsulitis 2013   Bilateral shoulders   Adhesive capsulitis of both shoulders 2013   Anemia    Cataract    Chronic kidney disease    Depression    Diabetes mellitus without complication (HCC)    Diabetic retinopathy (HCC) 2010   Diabetic retinopathy (HCC) 2010   Left eye, mild   Essential tremor 2011   Bilateral hands   Foot drop, bilateral 2007   Foot fracture, left    Gastroparesis 2009   GERD (gastroesophageal reflux disease)    Headache    Hemorrhage 2011   Laser surgery x2   High potassium 2012   Normal as of 2014   Hypercholesterolemia 2010   Hyperlipidemia    Neuromuscular disorder (HCC)    patient states that she has a body twitch.   Pneumonia    Postnasal drip 2010   Scratched cornea 2013   Left eye   Sleep apnea    does not wear CPAP   Stage 3 chronic kidney disease (HCC)    Thyroid disease    TMJ (temporomandibular joint disorder)    Type 1 diabetes (HCC)     Family History  Problem Relation Age of Onset   Hypertension Mother    Diabetes Father    COPD Father    Pneumonia Father    Stroke Maternal Grandmother    Hypertension Maternal Grandmother    Diabetes Maternal Grandfather    Parkinson's disease Maternal Grandfather    Heart disease Maternal  Grandfather    Parkinsonism Neg Hx    Neuropathy Neg Hx    Breast cancer Neg Hx     Past Surgical History:  Procedure Laterality Date   ABDOMINAL HYSTERECTOMY  2007   AMPUTATION Right 05/08/2021   Procedure: RIGHT BELOW KNEE AMPUTATION;  Surgeon: [name], MD;  Location: [facility] OR;  Service: Orthopedics;  Laterality: Right;   APPENDECTOMY     BALLOON DILATION N/A 01/24/2017   Procedure: BALLOON DILATION;  Surgeon: Charlott Rakes, MD;  Location: [facility];  Service: Endoscopy;  Laterality: N/A;   CARPAL TUNNEL RELEASE Right 1990  CATARACT EXTRACTION Bilateral 2008   COLONOSCOPY  2007   COLONOSCOPY WITH PROPOFOL N/A 01/24/2017   Procedure: COLONOSCOPY WITH PROPOFOL;  Surgeon: Charlott Rakes, MD;  Location: [facility];  Service: Endoscopy;  Laterality: N/A;   ESOPHAGOGASTRODUODENOSCOPY (EGD) WITH PROPOFOL N/A 01/24/2017   Procedure: ESOPHAGOGASTRODUODENOSCOPY (EGD) WITH PROPOFOL;  Surgeon: Charlott Rakes, MD;  Location: [facility];  Service: Endoscopy;  Laterality: N/A;  moved oer MD request   EYE SURGERY Left 2012   Pars plana viterctomy, membrane peel, endolaser   ROOT CANAL  2007   ROOT CANAL  2010   Thorat dilation  2007   Throat dilation  2007   Throat dilation  2010, 0ct 2011   Sep 2010, Nov 2010, 2014   TONSILLECTOMY  1971   Trigger fingers  2008   WISDOM TOOTH EXTRACTION  1977   Social History   Occupational History   Not on file  Tobacco Use   Smoking status: Never   Smokeless tobacco: Never  Vaping Use   Vaping Use: Never used  Substance and Sexual Activity   Alcohol use: No    Alcohol/week: 0.0 standard drinks   Drug use: Yes   Sexual activity: Not on file

## 2021-07-10 ENCOUNTER — Encounter: Payer: Self-pay | Admitting: Physician Assistant

## 2021-07-10 ENCOUNTER — Ambulatory Visit (INDEPENDENT_AMBULATORY_CARE_PROVIDER_SITE_OTHER): Payer: Managed Care, Other (non HMO) | Admitting: Physician Assistant

## 2021-07-10 DIAGNOSIS — Z89511 Acquired absence of right leg below knee: Secondary | ICD-10-CM

## 2021-07-10 DIAGNOSIS — S88111A Complete traumatic amputation at level between knee and ankle, right lower leg, initial encounter: Secondary | ICD-10-CM

## 2021-07-10 MED ORDER — ACETAMINOPHEN-CODEINE #3 300-30 MG PO TABS
1.0000 | ORAL_TABLET | ORAL | 0 refills | Status: AC | PRN
Start: 2021-07-10 — End: ?

## 2021-07-10 NOTE — Progress Notes (Signed)
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  She has already touch base with physical therapy.  She also would like me to take a look at the bottom of her left foot which has a small spot on it  Assessment & Plan: Visit Diagnoses: No diagnosis found.  Plan patient was given a prescription.  The pain medication.  She will follow-up in 5 weeks.  Follow-Up Instructions: No follow-ups on file.   Ortho Exam  Patient is alert, oriented, no adenopathy, well-dressed, normal affect, normal respiratory effort. On the right she has a well-healed amputation stump.  1 small tiny scab that is decreasing in size.  No drainage no ascending cellulitis her skin is in excellent condition on her left foot there is no swelling no redness no erythema.  She has a less than 1 mm lesion that is consistent with a very small blood blister no signs of infection  Imaging: No results found. No images are attached to the encounter.  Labs: Lab Results  Component Value Date   HGBA1C 7.0 (H) 05/08/2021   HGBA1C 5.0 06/10/2020   ESRSEDRATE 65 (H) 06/10/2020   CRP 6.3 (H) 06/10/2020   REPTSTATUS 06/15/2020 FINAL 06/10/2020   CULT  06/10/2020    NO GROWTH 5 DAYS Performed at [facility], [address] [place], [place] [address]      Lab Results  Component Value Date   ALBUMIN 3.4 (L) 06/13/2020   ALBUMIN 3.2 (L) 06/12/2020   ALBUMIN 3.0 (L) 06/11/2020   PREALBUMIN 12.6 (L) 06/10/2020    Lab Results  Component Value Date    MG 2.4 06/13/2020   MG 2.3 06/12/2020   No results found for: [facility]  Lab Results  Component Value Date   PREALBUMIN 12.6 (L) 06/10/2020   CBC EXTENDED Latest Ref Rng & Units 05/10/2021 05/09/2021 05/08/2021  WBC 4.0 - 10.5 K/uL 11.1(H) 13.3(H) 11.2(H)  RBC 3.87 - 5.11 MIL/uL 3.74(L) 3.76(L) 4.08  HGB 12.0 - 15.0 g/dL 11.3(L) 11.5(L) 12.4  HCT 36.0 - 46.0 % 36.7 37.0 40.0  PLT 150 - 400 K/uL 244 243 255  NEUTROABS 1.7 - 7.7 K/uL - - -  LYMPHSABS 0.7 - 4.0 K/uL - - -     There is no height or weight on file to calculate BMI.  Orders:  No orders of the defined types were placed in this encounter.  Meds ordered this encounter  Medications   acetaminophen-codeine (TYLENOL #3) 300-30 MG tablet    Sig: Take 1 tablet by mouth every 4 (four) hours as needed for moderate pain.    Dispense:  30 tablet    Refill:  0     Procedures: No procedures performed  Clinical Data: No additional findings.  ROS:  All other systems negative, except as noted in the  HPI. Review of Systems  Objective: Vital Signs: There were no vitals taken for this visit.  Specialty Comments:  No specialty comments available.  PMFS History: Patient Active Problem List   Diagnosis Date Noted   Foot pain, right 05/08/2021   Charcot's joint, right ankle and foot    Allergic rhinitis 03/17/2021   Anxiety 03/17/2021   Diabetic polyneuropathy (HCC) 03/17/2021   External hemorrhoids 03/17/2021   History of anemia 03/17/2021   Hyperglycemia due to type 1 diabetes mellitus (HCC) 03/17/2021   Long term (current) use of insulin (HCC) 03/17/2021   Personal history of other diseases of the respiratory system 03/17/2021   Recurrent major depression (HCC) 03/17/2021   Proliferative diabetic retinopathy of right eye without macular edema associated with type 1 diabetes mellitus (HCC) 12/25/2020   Stable treated proliferative diabetic retinopathy of left eye with macular edema determined by examination associated  with type 1 diabetes mellitus (HCC) 12/23/2020   Vitreous hemorrhage, right eye (HCC) 12/23/2020   Cellulitis 06/11/2020   Diabetic foot infection (HCC) 06/10/2020   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 03/05/2019   Special screening for malignant neoplasms, colon 01/24/2017   Difficulty in swallowing 01/24/2017   Esophageal reflux 01/24/2017   DM (diabetes mellitus) type 1 with ketoacidosis (HCC) 03/01/2015   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) 03/01/2015   Diabetic neuropathy (HCC) 03/01/2015   Hypothyroidism 03/01/2015   Depression 03/01/2015   Orthostatic hypotension 03/01/2015   Essential tremor 03/01/2015   Diabetic retinopathy (HCC) 03/01/2015   Gastroparesis due to DM (HCC) 03/01/2015   Chronic renal insufficiency 03/01/2015   Foot drop, bilateral 03/01/2015   HLD (hyperlipidemia) 03/01/2015   Obstructive sleep apnea 03/01/2015   Past Medical History:  Diagnosis Date   Adhesive capsulitis 2013   Bilateral shoulders   Adhesive capsulitis of both shoulders 2013   Anemia    Cataract    Chronic kidney disease    Depression    Diabetes mellitus without complication (HCC)    Diabetic retinopathy (HCC) 2010   Diabetic retinopathy (HCC) 2010   Left eye, mild   Essential tremor 2011   Bilateral hands   Foot drop, bilateral 2007   Foot fracture, left    Gastroparesis 2009   GERD (gastroesophageal reflux disease)    Headache    Hemorrhage 2011   Laser surgery x2   High potassium 2012   Normal as of 2014   Hypercholesterolemia 2010   Hyperlipidemia    Neuromuscular disorder (HCC)    patient states that she has a body twitch.   Pneumonia    Postnasal drip 2010   Scratched cornea 2013   Left eye   Sleep apnea    does not wear CPAP   Stage 3 chronic kidney disease (HCC)    Thyroid disease    TMJ (temporomandibular joint disorder)    Type 1 diabetes (HCC)     Family History  Problem Relation Age of Onset   Hypertension  Mother    Diabetes Father    COPD Father    Pneumonia Father    Stroke Maternal Grandmother    Hypertension Maternal Grandmother    Diabetes Maternal Grandfather    Parkinson's disease Maternal Grandfather    Heart disease Maternal Grandfather    Parkinsonism Neg Hx    Neuropathy Neg Hx    Breast cancer Neg Hx     Past Surgical History:  Procedure Laterality Date   ABDOMINAL HYSTERECTOMY  2007   AMPUTATION Right  05/08/2021   Procedure: RIGHT BELOW KNEE AMPUTATION;  Surgeon: [name], MD;  Location[facility] OR;  Service: Orthopedics;  Laterality: Right;   APPENDECTOMY     BALLOON DILATION N/A 01/24/2017   Procedure: BALLOON DILATION;  Surgeon: Charlott Rakes, MD;  Location: [facility] ENDOSCOPY;  Service: Endoscopy;  Laterality: N/A;   CARPAL TUNNEL RELEASE Right 1990   CATARACT EXTRACTION Bilateral 2008   COLONOSCOPY  2007   COLONOSCOPY WITH PROPOFOL N/A 01/24/2017   Procedure: COLONOSCOPY WITH PROPOFOL;  Surgeon: Charlott Rakes, MD;  Location: [facility];  Service: Endoscopy;  Laterality: N/A;   ESOPHAGOGASTRODUODENOSCOPY (EGD) WITH PROPOFOL N/A 01/24/2017   Procedure: ESOPHAGOGASTRODUODENOSCOPY (EGD) WITH PROPOFOL;  Surgeon: Charlott Rakes, MD;  Location: [facility];  Service: Endoscopy;  Laterality: N/A;  moved oer MD request   EYE SURGERY Left 2012   Pars plana viterctomy, membrane peel, endolaser   ROOT CANAL  2007   ROOT CANAL  2010   Thorat dilation  2007   Throat dilation  2007   Throat dilation  2010, 0ct 2011   Sep 2010, Nov 2010, 2014   TONSILLECTOMY  1971   Trigger fingers  2008   WISDOM TOOTH EXTRACTION  1977   Social History   Occupational History   Not on file  Tobacco Use   Smoking status: Never   Smokeless tobacco: Never  Vaping Use   Vaping Use: Never used  Substance and Sexual Activity   Alcohol use: No    Alcohol/week: 0.0 standard drinks   Drug use: Yes   Sexual activity: Not on file

## 2021-07-14 ENCOUNTER — Encounter: Payer: Managed Care, Other (non HMO) | Admitting: Physical Therapy

## 2021-07-14 IMAGING — CR DG FOOT COMPLETE 3+V*R*
3 series · 3 of 3 positions shown · non-contrast
Comparison: May 02, 2020

CLINICAL DATA: Possible infection, ulcer on the lateral corner of
the foot.

EXAM:
RIGHT FOOT COMPLETE - 3+ VIEW

[foot ap]
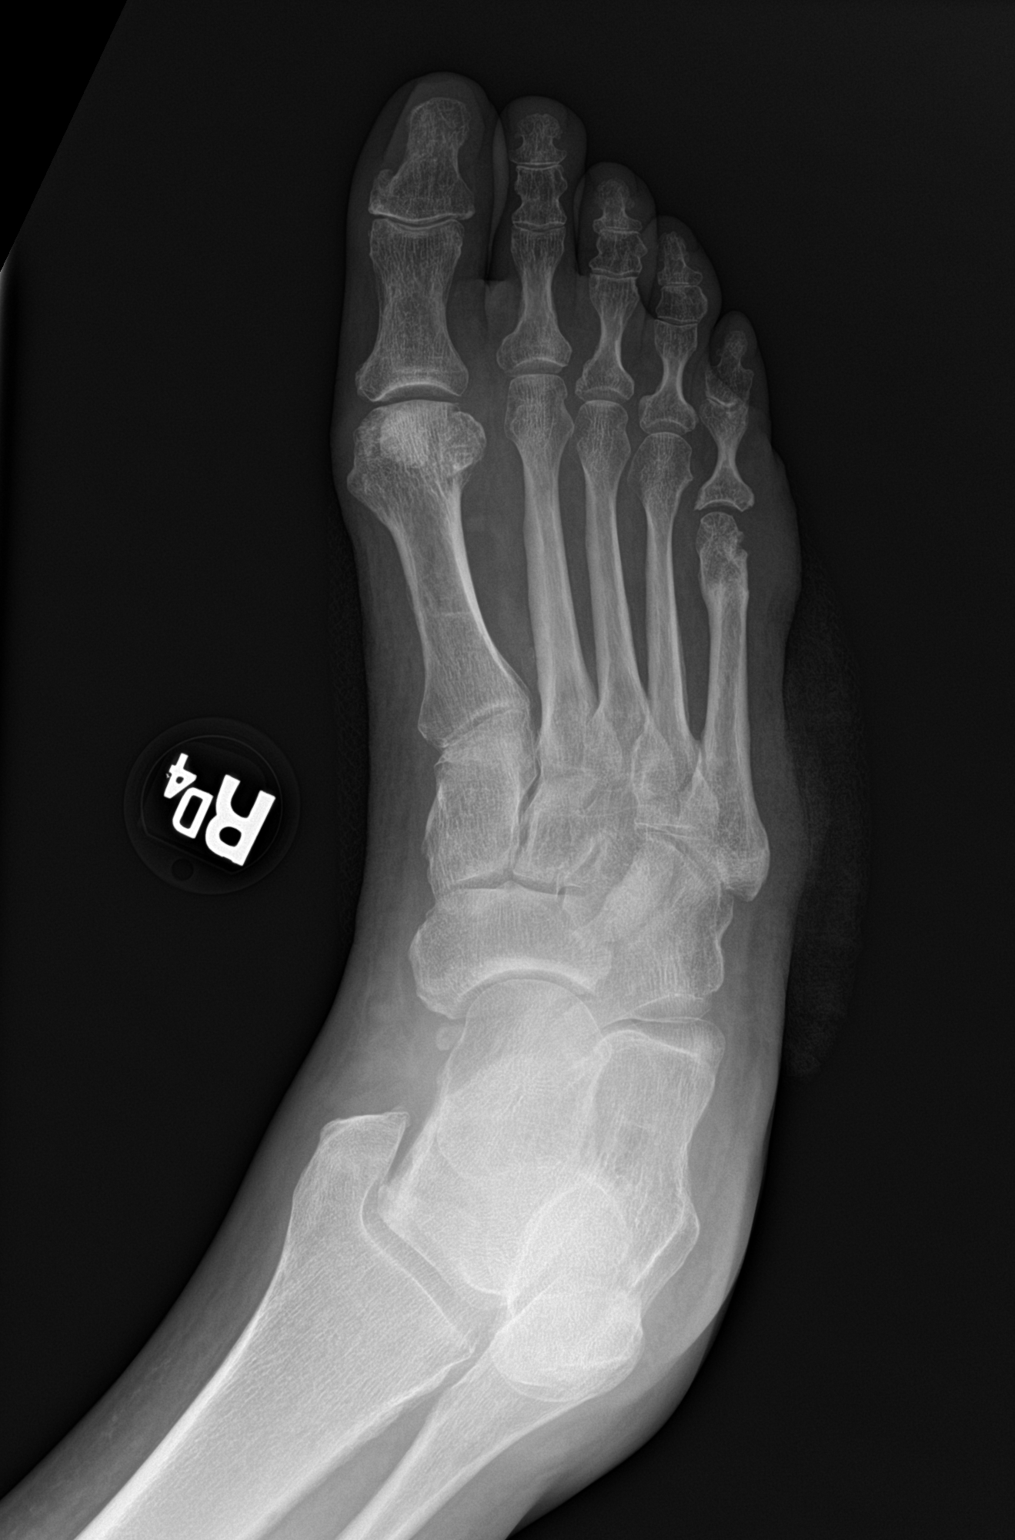

[foot obl]
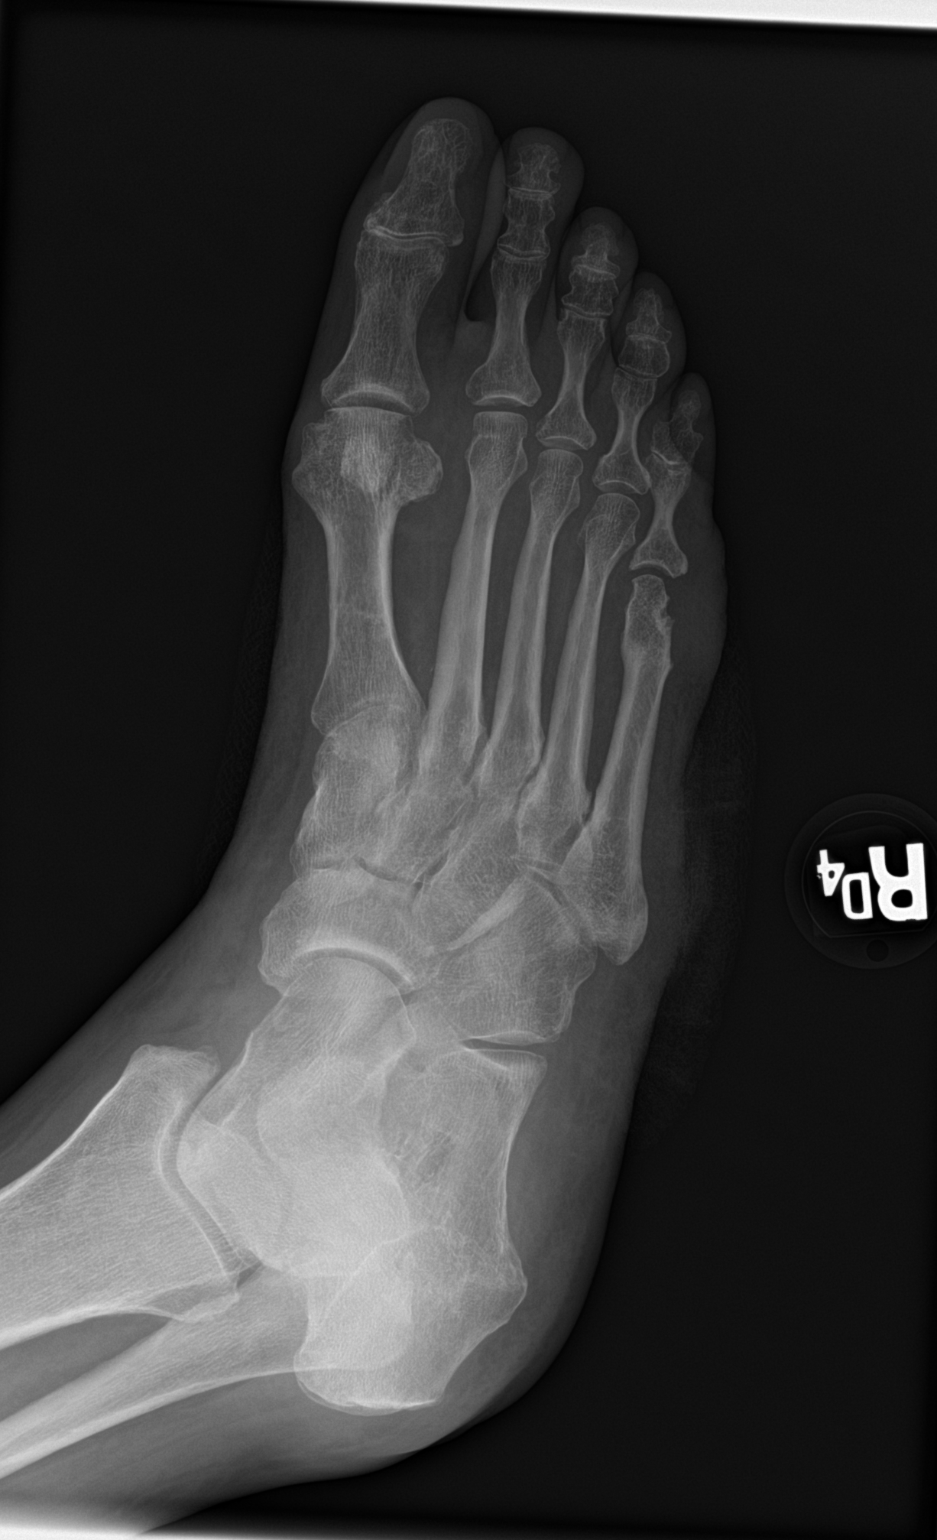

[foot lat]
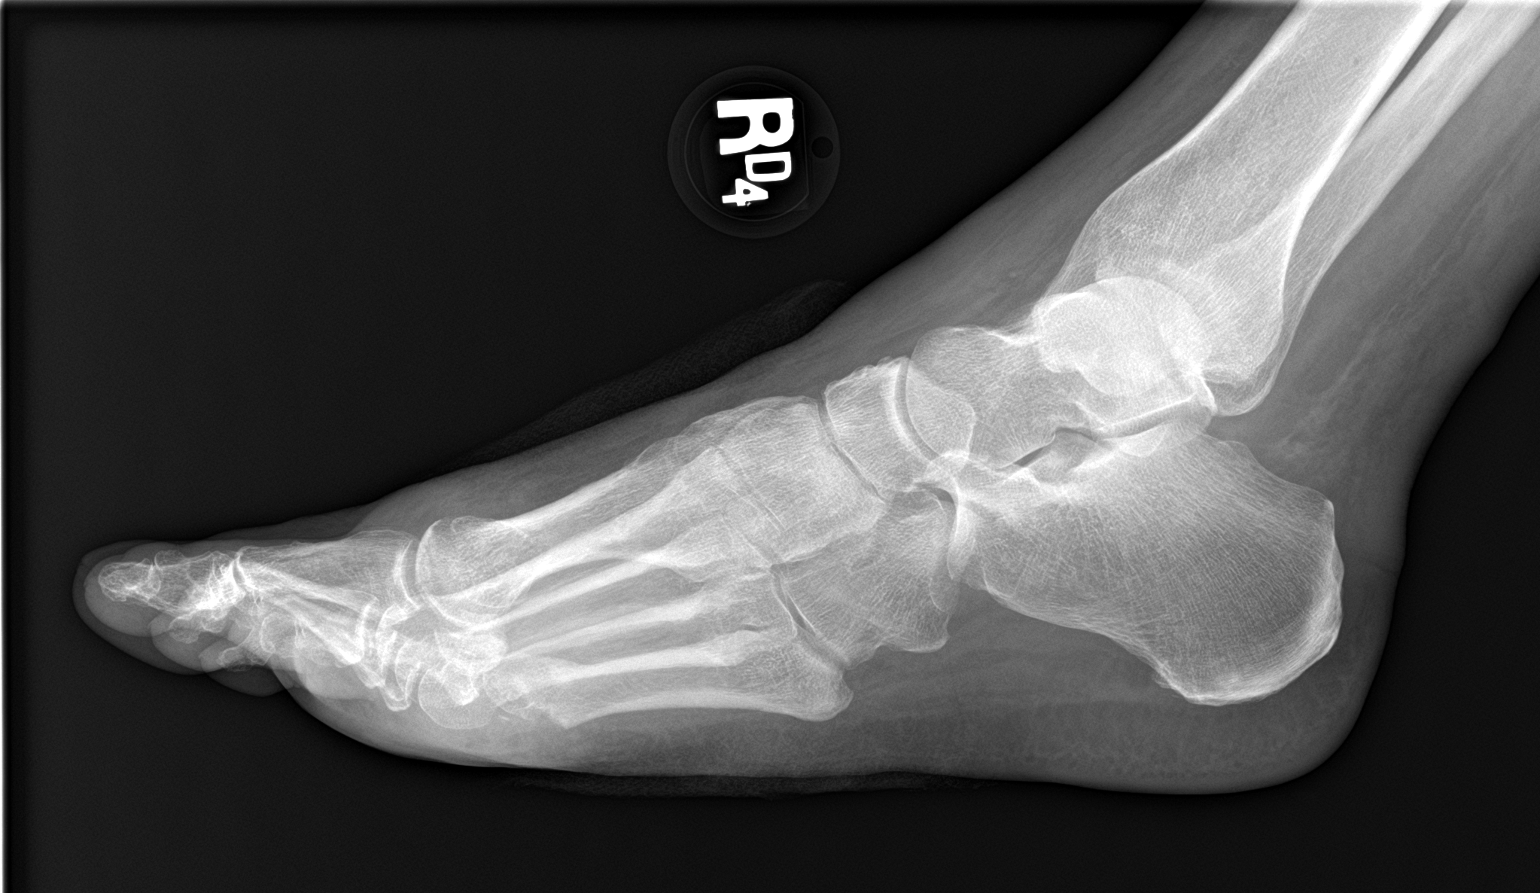

[3 of 3 positions shown; findings below may reference images not displayed]

FINDINGS: No fracture seen. Again noted is cortical erosive type changes seen
at the fifth metatarsal head with a possible healed fracture
deformity, as on the prior exam. Overlying area of ulceration and
soft tissue swelling is seen. No other areas of cortical destruction
are seen. There is mild periosteal reaction seen in the fourth
metatarsal shaft. Dorsal subcutaneous edema is noted.
IMPRESSION: Findings suggestive of probable subacute/chronic osteomyelitis of
the fifth metatarsal head with a healed fracture deformity as on the
prior exam. Overlying area of ulceration and soft tissue swelling is
seen.

## 2021-07-15 IMAGING — MR MR FOOT*R* W/O CM
5 series · 40 of 40 positions shown · non-contrast
Comparison: None.

CLINICAL DATA: Ulcer on the lateral aspect of the foot.

EXAM:
MRI OF THE RIGHT FOREFOOT WITHOUT CONTRAST
TECHNIQUE: Multiplanar, multisequence MR imaging of the right was performed. No
intravenous contrast was administered.

[Series 10: STIR · oblique · right · 3.0mm · 0.35mm/px · 8 of 25 slices shown]
[im 1/25]
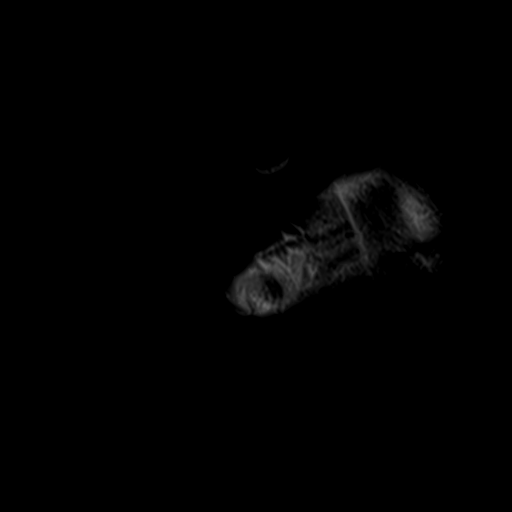
[im 4/25]
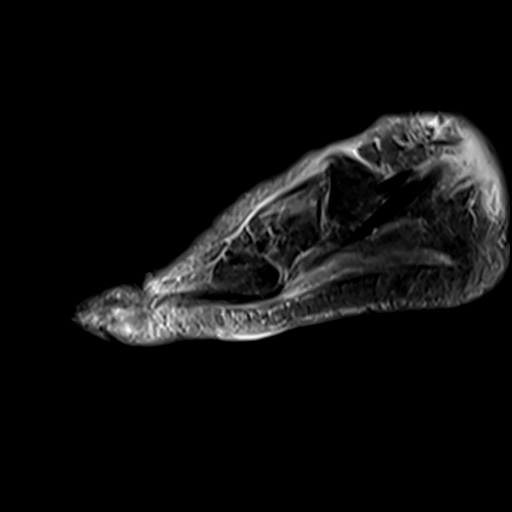
[im 7/25]
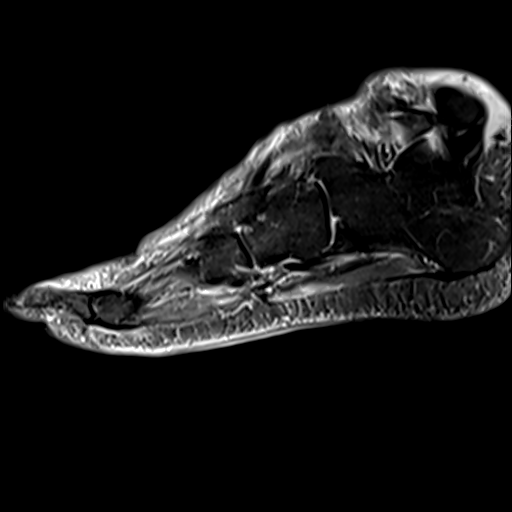
[im 11/25]
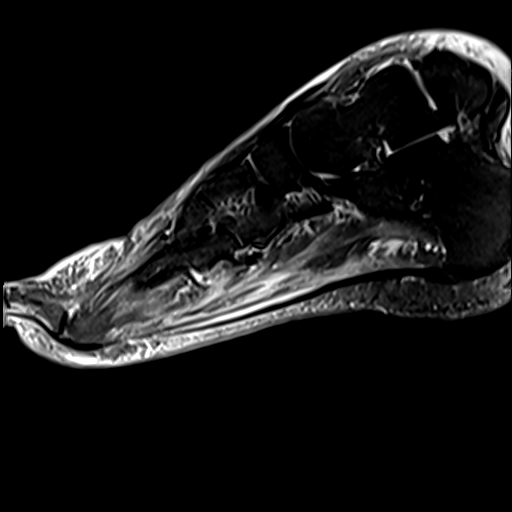
[im 14/25]
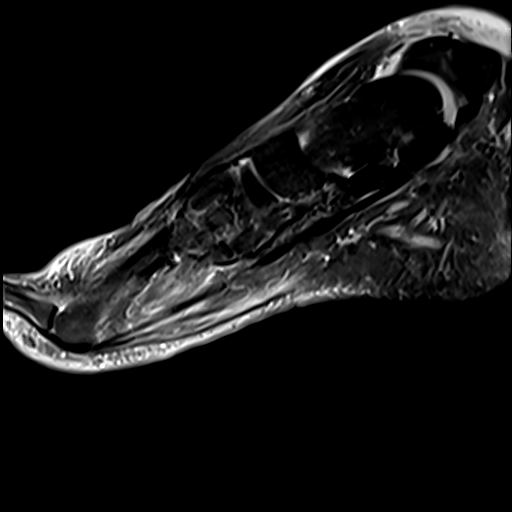
[im 18/25]
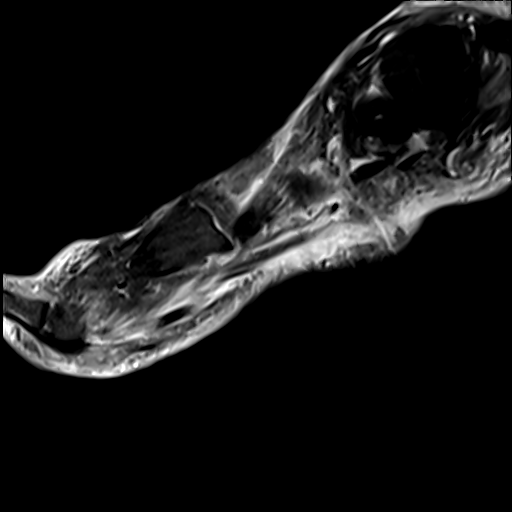
[im 21/25]
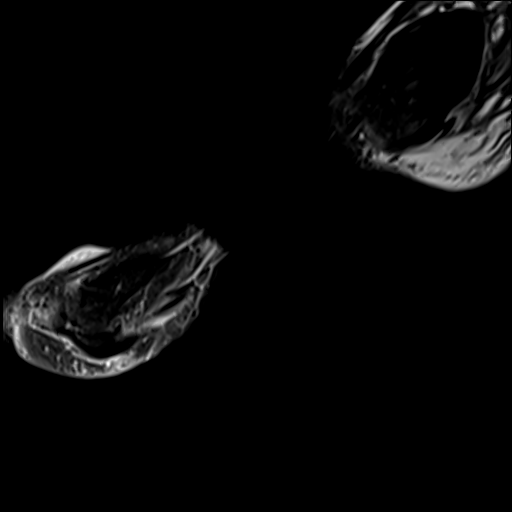
[im 25/25]
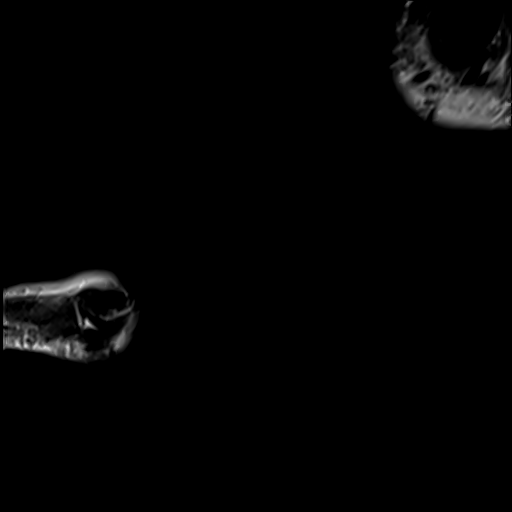

[Series 100: T2 fat-sat · coronal · right · 3.0mm · 0.55mm/px · 9 of 30 slices shown (1 of 2)]
[im 1/30]
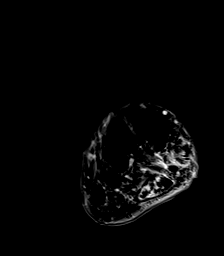
[im 4/30]
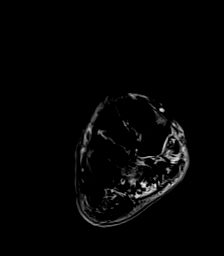
[im 8/30]
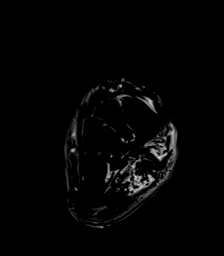
[im 11/30]
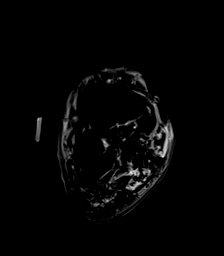
[im 15/30]
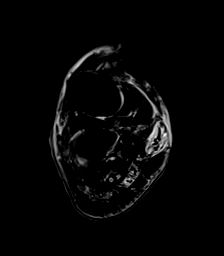
[im 19/30]
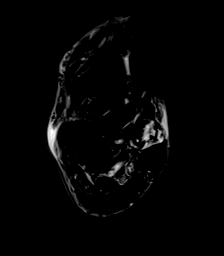
[im 22/30]
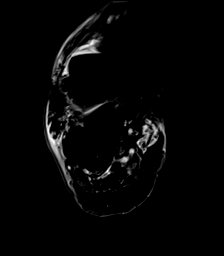
[im 26/30]
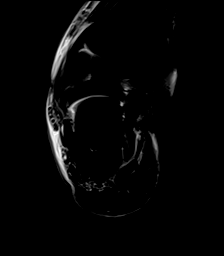
[im 30/30]
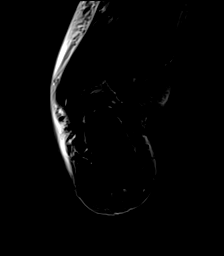

[Series 101: T1 · coronal · right · 3.0mm · 0.55mm/px · 9 of 30 slices shown (1 of 2)]
[im 1/30]
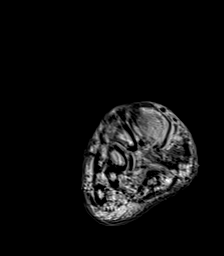
[im 4/30]
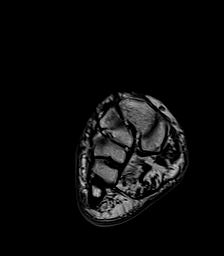
[im 8/30]
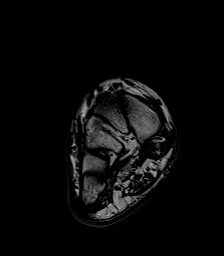
[im 11/30]
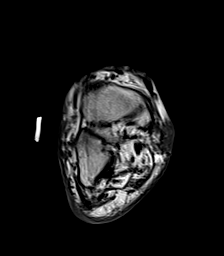
[im 15/30]
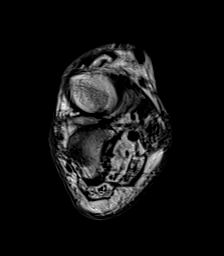
[im 19/30]
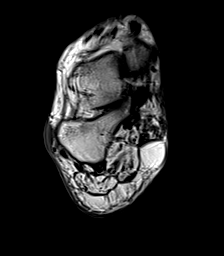
[im 22/30]
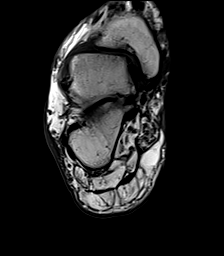
[im 26/30]
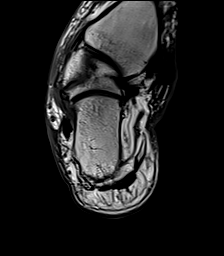
[im 30/30]
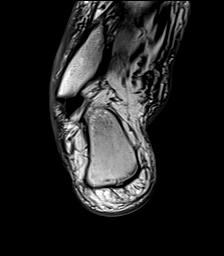

[Series 102: T1 · oblique · right · 3.0mm · 0.47mm/px · 7 of 23 slices shown (2 of 2)]
[im 1/23]
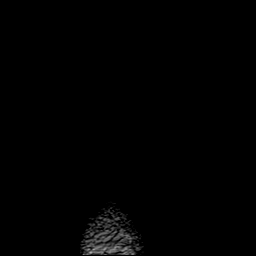
[im 4/23]
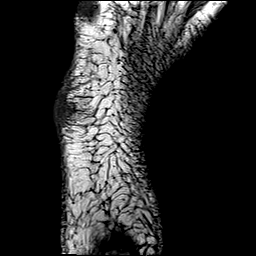
[im 8/23]
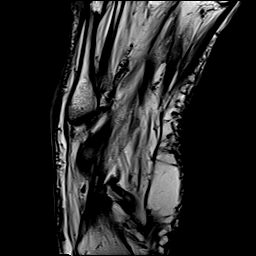
[im 12/23]
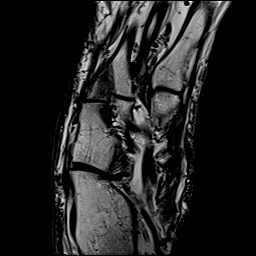
[im 15/23]
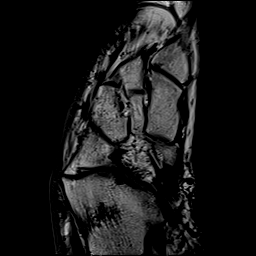
[im 19/23]
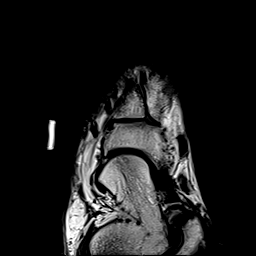
[im 23/23]
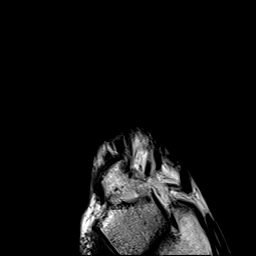

[Series 103: T2 fat-sat · oblique · right · 3.0mm · 0.38mm/px · 7 of 23 slices shown (2 of 2)]
[im 1/23]
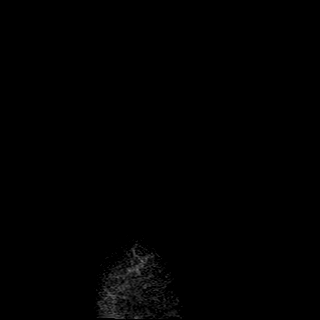
[im 4/23]
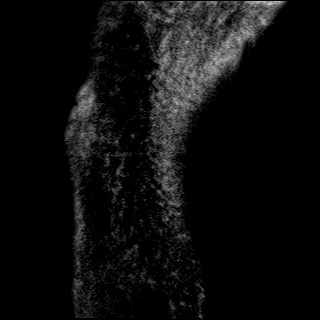
[im 8/23]
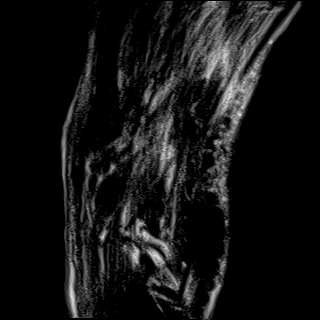
[im 12/23]
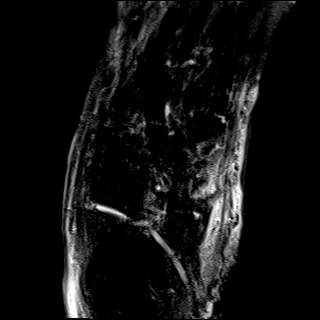
[im 15/23]
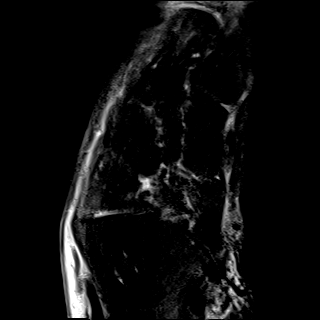
[im 19/23]
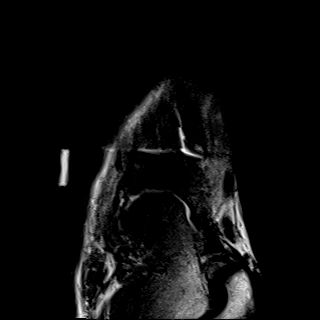
[im 23/23]
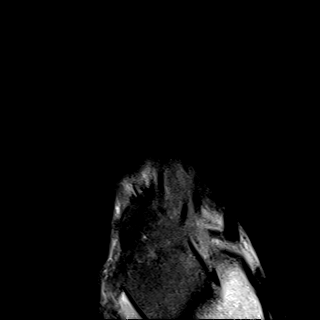

[40 of 40 positions shown; findings below may reference images not displayed]

FINDINGS: Bones/Joint/Cartilage

Beneath the area of interest denoted by the skin marker there is
normal osseous marrow signal seen throughout within the midfoot and
the proximal metatarsal bases. No osseous fracture, cortical
destruction or periosteal reaction is seen. The articular surfaces
appear to be maintained. No large joint effusions.

Ligaments

Suboptimally visualized

Muscles and Tendons

There is mild fatty atrophy with increased diffuse signal noted
within the muscles surrounding the hindfoot. The visualized portion
of the tendons are intact. The plantar fascia appears to be intact.

Soft tissues

Mild dorsal subcutaneous edema is seen. There is also subcutaneous
edema with a superficial ulceration over the lateral aspect of the
midfoot. No loculated fluid collections or sinus tract is seen.
IMPRESSION: No definite evidence osteomyelitis within the visualized portions of
the lateral aspect of midfoot. Superficial ulceration with
subcutaneous edema beneath the area of interest. No sinus tract or
loculated fluid collection.

## 2021-07-21 ENCOUNTER — Encounter: Payer: Self-pay | Admitting: Physical Therapy

## 2021-07-21 ENCOUNTER — Ambulatory Visit (INDEPENDENT_AMBULATORY_CARE_PROVIDER_SITE_OTHER): Payer: Managed Care, Other (non HMO) | Admitting: Orthopedic Surgery

## 2021-07-21 ENCOUNTER — Ambulatory Visit (INDEPENDENT_AMBULATORY_CARE_PROVIDER_SITE_OTHER): Payer: Managed Care, Other (non HMO) | Admitting: Physical Therapy

## 2021-07-21 ENCOUNTER — Other Ambulatory Visit: Payer: Self-pay

## 2021-07-21 ENCOUNTER — Encounter: Payer: Self-pay | Admitting: Orthopedic Surgery

## 2021-07-21 DIAGNOSIS — L03032 Cellulitis of left toe: Secondary | ICD-10-CM | POA: Diagnosis not present

## 2021-07-21 DIAGNOSIS — R2689 Other abnormalities of gait and mobility: Secondary | ICD-10-CM | POA: Diagnosis not present

## 2021-07-21 DIAGNOSIS — Z9181 History of falling: Secondary | ICD-10-CM | POA: Diagnosis not present

## 2021-07-21 DIAGNOSIS — R293 Abnormal posture: Secondary | ICD-10-CM

## 2021-07-21 DIAGNOSIS — R2681 Unsteadiness on feet: Secondary | ICD-10-CM

## 2021-07-21 DIAGNOSIS — M6281 Muscle weakness (generalized): Secondary | ICD-10-CM

## 2021-07-21 DIAGNOSIS — M21372 Foot drop, left foot: Secondary | ICD-10-CM

## 2021-07-21 DIAGNOSIS — M25552 Pain in left hip: Secondary | ICD-10-CM

## 2021-07-21 DIAGNOSIS — M6249 Contracture of muscle, multiple sites: Secondary | ICD-10-CM

## 2021-07-21 DIAGNOSIS — M25551 Pain in right hip: Secondary | ICD-10-CM

## 2021-07-21 MED ORDER — DOXYCYCLINE HYCLATE 100 MG PO TABS: 100.0000 mg | ORAL_TABLET | Freq: Two times a day (BID) | ORAL | 0 refills | Status: AC

## 2021-07-21 NOTE — Therapy (Signed)
[facility] Physical Therapy [address] [place], [place], [address] Phone: [phone]   Fax:  [phone]  Physical Therapy Evaluation  Patient Details  Name: [name] MRN: [id number] Date of Birth: Mar 28, 1961 Referring Provider (PT): Bevely Palmer Persons, [place]   Encounter Date: 07/21/2021   PT End of Session - 07/21/21 1923     Visit Number 1    Number of Visits 25    Date for PT Re-Evaluation 10/15/21    Authorization Type [employer] Managed    Authorization Time Period $50 COPAY, OOP $4000 $2719.68 MET, 60 DAYS PER CALENDER YEAR, PT/OT/ CHIRO, AFTER OOP MET    PT Start Time 1620    PT Stop Time 9024    PT Time Calculation (min) 55 min    Activity Tolerance Patient tolerated treatment well;Patient limited by fatigue;Patient limited by pain    Behavior During Therapy [facility] for tasks assessed/performed             Past Medical History:  Diagnosis Date   Adhesive capsulitis 2013   Bilateral shoulders   Adhesive capsulitis of both shoulders 2013   Anemia    Cataract    Chronic kidney disease    Depression    Diabetes mellitus without complication ([place])    Diabetic retinopathy ([place]) 2010   Diabetic retinopathy ([place]) 2010   Left eye, mild   Essential tremor 2011   Bilateral hands   Foot drop, bilateral 2007   Foot fracture, left    Gastroparesis 2009   GERD (gastroesophageal reflux disease)    Headache    Hemorrhage 2011   Laser surgery x2   High potassium 2012   Normal as of 2014   Hypercholesterolemia 2010   Hyperlipidemia    Neuromuscular disorder ([place])    patient states that she has a body twitch.   Pneumonia    Postnasal drip 2010   Scratched cornea 2013   Left eye   Sleep apnea    does not wear CPAP   Stage 3 chronic kidney disease (HCC)    Thyroid disease    TMJ (temporomandibular joint disorder)    Type 1 diabetes ([place])     Past Surgical History:  Procedure Laterality Date   ABDOMINAL HYSTERECTOMY  2007   AMPUTATION Right  05/08/2021   Procedure: RIGHT BELOW KNEE AMPUTATION;  Surgeon: Newt Minion, MD;  Location: [facility];  Service: Orthopedics;  Laterality: Right;   APPENDECTOMY     BALLOON DILATION N/A 01/24/2017   Procedure: BALLOON DILATION;  Surgeon: Wilford Corner, MD;  [facility];  Service: Endoscopy;  Laterality: N/A;   CARPAL TUNNEL RELEASE Right 1990   CATARACT EXTRACTION Bilateral 2008   COLONOSCOPY  2007   COLONOSCOPY WITH PROPOFOL N/A 01/24/2017   Procedure: COLONOSCOPY WITH PROPOFOL;  Surgeon: Wilford Corner, MD;  [facility];  Service: Endoscopy;  Laterality: N/A;   ESOPHAGOGASTRODUODENOSCOPY (EGD) WITH PROPOFOL N/A 01/24/2017   Procedure: ESOPHAGOGASTRODUODENOSCOPY (EGD) WITH PROPOFOL;  Surgeon: Wilford Corner, MD;  [facility];  Service: Endoscopy;  Laterality: N/A;  moved oer MD request   EYE SURGERY Left 2012   Pars plana viterctomy, membrane peel, endolaser   ROOT CANAL  2007   ROOT CANAL  2010   Thorat dilation  2007   Throat dilation  2007   Throat dilation  2010, 0ct 2011   Sep 2010, Nov 2010, 2014   TONSILLECTOMY  1971   Trigger fingers  2008   [place] EXTRACTION  [phone]  There were no vitals filed for this visit.    Subjective Assessment - 07/21/21 1623     Subjective This 60yo female was referred to PT by [place], [place] with [phone] (ICD-10-CM) - Below-knee amputation of right lower extremity performed on 05/08/2021.  Her prosthesis was delivered 07/15/2021 with [place], [place].  Patient was late to PT evaluation as seeing Dr. Sharol Given who removed her left great toenail.    Patient is accompained by: Family member   husband, [name]   Pertinent History Right TTA, CKD, DM1, retinopathy, bil. foot drop, HA    Limitations Standing;Walking;House hold activities    Patient Stated Goals To use prosthesis to be independent in home.    Currently in Pain? Yes    Pain Score 6    in last week, best 2/10 & worst 10/10   Pain  Location Hip    Pain Orientation Right;Left    Pain Descriptors / Indicators Sharp    Pain Type Chronic pain    Pain Onset More than a month ago    Pain Frequency Constant    Aggravating Factors  sitting    Pain Relieving Factors tylenol, laying different side                [facility] PT Assessment - 07/21/21 1620       Assessment   Medical Diagnosis Right Transtibial Amputation    Referring Provider (PT) Bevely Palmer Persons, [place]    Onset Date/Surgical Date 07/15/21   prsothesis delivery   Hand Dominance Right    Prior Therapy nursing home      Precautions   Precautions Fall      Balance Screen   Has the patient fallen in the past 6 months Yes    How many times? 3   2 was leaned forward in w/c & 1 forgot amputation, no injuries   Has the patient had a decrease in activity level because of a fear of falling?  Yes    Is the patient reluctant to leave their home because of a fear of falling?  No   uses w/c     Home Environment   Living Environment Private residence    Living Arrangements Spouse/significant other;Children   17yo dtr   Type of [place] entrance   stair lift from basement garage to main level with single step up to seat.   Home Layout Two level;Able to live on main level with bedroom/bathroom;Full bath on main level    [place]ideon - 4 wheels;Walker - 2 wheels;Bedside commode;Tub bench;Grab bars - tub/shower;Wheelchair - Press photographer      Prior Function   Level of Independence Independent with household mobility with device   RW in home, w/c in community >32yr   Vocation On disability    Leisure reading,      Posture/Postural Control   Posture/Postural Control Postural limitations    Postural Limitations Rounded Shoulders;Forward head;Posterior pelvic tilt      ROM / Strength   AROM / PROM / Strength Strength;PROM      PROM   Overall PROM Comments assessed grossly seated but appears to have tightness in  shoulders, back, hips & knees.      Strength   Overall Strength Deficits    Overall Strength Comments BUEs grossly 4/5 in seated position with shoulders <75*    Right Hip Flexion 3-/5    Right Hip Extension 3-/5  gross testing seated   Right Hip ABduction 3-/5   gross testing seated   Left Hip Flexion 3-/5    Left Hip Extension 3-/5   gross testing seated   Left Hip ABduction 3-/5   gross testing seated   Right Knee Flexion 3/5   gross testing seated   Right Knee Extension 3/5    Left Knee Flexion 3/5   gross testing seated   Left Knee Extension 3+/5    Left Ankle Dorsiflexion 0/5             Prosthetics Assessment - 07/21/21 1620       Prosthetics   Prosthetic Care Dependent with Skin check;Residual limb care;Care of non-amputated limb;Prosthetic cleaning;Ply sock cleaning;Correct ply sock adjustment;Proper wear schedule/adjustment;Proper weight-bearing schedule/adjustment    Donning prosthesis  Max assist    Doffing prosthesis  Mod assist    Current prosthetic wear tolerance (days/week)  3 of 6 days since prosthesis delivery    Current prosthetic wear tolerance (#hours/day)  3-4 hours 1/xday    Edema pitting    Residual limb condition  1cm scab laterally & 57m wound medially which may be internal suture working out, pale skin color, no hair growth, normal mositure & temperature, cylinderical shape.    Prosthesis Description silicon liner with shuttle pin lock suspension, SACH foot                       Objective measurements completed on examination: See above findings.       [facility] - 07/21/21 1620       Prosthetics   Prosthetic Care Comments  Initiate wear 2hrs 2-3 times per day sitting or laying in bed.    Education Provided Skin check;Residual limb care;Prosthetic cleaning;Proper Donning;Proper wear schedule/adjustment;Other (comment)   see prosthetic care comments   Person(s) Educated Patient;Spouse    Education Method  Explanation;Demonstration;Tactile cues;Verbal cues    Education Method Verbalized understanding;Tactile cues required;Verbal cues required;Needs further instruction                      PT Short Term Goals - 07/21/21 1930       PT SHORT TERM GOAL #1   Title Patient's family demonstrate proper donning & verbalize proper cleaning of prosthesis.    Time 4    Period Weeks    Status New    Target Date 08/20/21      PT SHORT TERM GOAL #2   Title Patient tolerates prosthesis or prosthetic liner if in bed wear >8hrs total per day without increase in skin issues.    Time 4    Period Weeks    Status New    Target Date 08/20/21      PT SHORT TERM GOAL #3   Title transfers TBD               PT Long Term Goals - 07/21/21 1925       PT LONG TERM GOAL #1   Title Patient & family verbalize & demonstrate proper prosthetic care to enable safe utilization of prosthesis.    Time 12    Period Weeks    Status New    Target Date 10/15/21      PT LONG TERM GOAL #2   Title Patient tolerates prosthesis wear >/= 12 hours /day (if in bed during day time, then tolerates liner wear for quick prosthetic wear for transfers) without skin or limb pain issues.  Time 12    Period Weeks    Status New    Target Date 10/15/21      PT LONG TERM GOAL #3   Title scooting or squat pivot transfers w/c to mat or another chair with prosthesis with supervision.    Time 12    Period Weeks    Status New    Target Date 10/15/21      PT LONG TERM GOAL #4   Title standing TBD      PT LONG TERM GOAL #5   Title gait TBD                    Plan - 07/21/21 1916     Clinical Impression Statement This 60yo female underwent a right Transtibial Amputation on 05/08/2021 and received her first prosthesis on 07/15/2021.  She is dependent in proper prosthetic care which increases risk of skin issues. She has pain in bilateral hips limiting mobility.  Patient has worn prosthesis 3 of 6 days  since delivery for 2-4 hours which limits function during her day.  Patient has history of limited mobility in home with rollator walker. She has been bed & w/c bound since her amputation. She has significant limitations in flexibiity & strength limiting her mobility & function. Her standing & gait are dependent per report but not able to assess at PT evaluation.  Patient would benefit from skilled PT to improve function & safety with her prosthesis.    Personal Factors and Comorbidities Comorbidity 3+;Fitness;Past/Current Experience    Comorbidities Right TTA, CKD, DM1, retinopathy, neuropathy, bil. foot drop, HA    Examination-Activity Limitations Bed Mobility;Locomotion Level;Stand;Toileting;Transfers    Stability/Clinical Decision Making Evolving/Moderate complexity    Clinical Decision Making Moderate    Rehab Potential Good    PT Frequency 2x / week    PT Duration 12 weeks    PT Treatment/Interventions ADLs/Self Care Home Management;DME Instruction;Gait training;Stair training;Functional mobility training;Therapeutic activities;Therapeutic exercise;Balance training;Neuromuscular re-education;Patient/family education;Prosthetic Training;Vestibular    PT Next Visit Plan assess transfers, standing & gait, review prosthetic care & progress wear, check wounds,    Consulted and Agree with Plan of Care Patient             Patient will benefit from skilled therapeutic intervention in order to improve the following deficits and impairments:  Abnormal gait, Decreased activity tolerance, Decreased balance, Decreased coordination, Decreased endurance, Decreased knowledge of use of DME, Decreased mobility, Decreased range of motion, Decreased skin integrity, Decreased strength, Difficulty walking, Increased edema, Impaired flexibility, Postural dysfunction, Prosthetic Dependency  Visit Diagnosis: Muscle weakness (generalized)  Other abnormalities of gait and mobility  Unsteadiness on  feet  History of falling  Abnormal posture  Contracture of muscle, multiple sites  Pain in left hip  Pain in right hip  Foot drop, left     Problem List Patient Active Problem List   Diagnosis Date Noted   Foot pain, right 05/08/2021   Charcot's joint, right ankle and foot    Allergic rhinitis 03/17/2021   Anxiety 03/17/2021   Diabetic polyneuropathy ([place]) 03/17/2021   External hemorrhoids 03/17/2021   History of anemia 03/17/2021   Hyperglycemia due to type 1 diabetes mellitus ([place]) 03/17/2021   Long term (current) use of insulin ([place]) 03/17/2021   Personal history of other diseases of the respiratory system 03/17/2021   Recurrent major depression ([place]) 03/17/2021   Proliferative diabetic retinopathy of right eye without macular edema associated with type 1 diabetes mellitus ([place]) 12/25/2020  Stable treated proliferative diabetic retinopathy of left eye with macular edema determined by examination associated with type 1 diabetes mellitus ([place]) 12/23/2020   Vitreous hemorrhage, right eye ([place]) 12/23/2020   Cellulitis 06/11/2020   Diabetic foot infection ([place]) 06/10/2020   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 03/05/2019   Special screening for malignant neoplasms, colon 01/24/2017   Difficulty in swallowing 01/24/2017   Esophageal reflux 01/24/2017   DM (diabetes mellitus) type 1 with ketoacidosis ([place]) 03/01/2015   Diabetes mellitus type 1 with complications ([place]) 93/57/0177   Diabetic neuropathy ([place]) 03/01/2015   Hypothyroidism 03/01/2015   Depression 03/01/2015   Orthostatic hypotension 03/01/2015   Essential tremor 03/01/2015   Diabetic retinopathy ([place]) 03/01/2015   Gastroparesis due to DM ([place]) 03/01/2015   Chronic renal insufficiency 03/01/2015   Foot drop, bilateral 03/01/2015   HLD (hyperlipidemia) 03/01/2015   Obstructive sleep apnea 03/01/2015     Jamey Reas, PT, DPT 07/21/2021, 7:32 PM  [facility]  Physical Therapy [address] [place], [place], [address] Phone: [phone]   Fax:  [phone]  Name: [name] MRN: [id number] Date of Birth: [birth year]

## 2021-07-21 NOTE — Progress Notes (Signed)
Office Visit Note   Patient: [name]           Date of Birth: 1961/10/30           MRN: [id number] Visit Date: 07/21/2021              Requested by: Soundra Pilon, [name] [address] [place],  [place] [address] PCP: Soundra Pilon, [name]  Chief Complaint  Patient presents with   Right Leg - Follow-up      HPI: Patient is a 60 year old woman who presents for 2 separate issues.  #1 she is status post right transtibial amputation she is about 2 months out she is currently wearing her prosthesis states she is doing well.  #2 she has developed a cellulitic blister ulcer around the left great toenail consistent with a paronychial infection.  Assessment & Plan: Visit Diagnoses:  1. Paronychia of great toe of left foot     Plan: The nail was removed sterile dressing applied a prescription for doxycycline called and she will start dressing changes tomorrow follow-up in the office in 2 weeks.  Follow-Up Instructions: Return in about 2 weeks (around 08/04/2021).   Ortho Exam  Patient is alert, oriented, no adenopathy, well-dressed, normal affect, normal respiratory effort. Examination patient's has well fitting prosthesis on the right.  She presents with concerns with paronychial infection of the left great toe.  Patient has good pulses the great toe left foot has swelling and fluid drainage around the base of the nail.  After informed consent patient's great toe was sterilely prepped with Betadine she underwent local anesthesia with 8 cc of 1% lidocaine plain the nail was removed without complications this was debrided sterile dressing applied plan to start doxycycline today.  Imaging: No results found. No images are attached to the encounter.  Labs: Lab Results  Component Value Date   HGBA1C 7.0 (H) 05/08/2021   HGBA1C 5.0 06/10/2020   ESRSEDRATE 65 (H) 06/10/2020   CRP 6.3 (H) 06/10/2020   REPTSTATUS 06/15/2020 FINAL 06/10/2020   CULT  06/10/2020    NO GROWTH 5  DAYS Performed at [facility], [address], [place], [place] [address]      Lab Results  Component Value Date   ALBUMIN 3.4 (L) 06/13/2020   ALBUMIN 3.2 (L) 06/12/2020   ALBUMIN 3.0 (L) 06/11/2020   PREALBUMIN 12.6 (L) 06/10/2020    Lab Results  Component Value Date   MG 2.4 06/13/2020   MG 2.3 06/12/2020   No results found for: [facility]  Lab Results  Component Value Date   PREALBUMIN 12.6 (L) 06/10/2020   CBC EXTENDED Latest Ref Rng & Units 05/10/2021 05/09/2021 05/08/2021  WBC 4.0 - 10.5 K/uL 11.1(H) 13.3(H) 11.2(H)  RBC 3.87 - 5.11 MIL/uL 3.74(L) 3.76(L) 4.08  HGB 12.0 - 15.0 g/dL 11.3(L) 11.5(L) 12.4  HCT 36.0 - 46.0 % 36.7 37.0 40.0  PLT 150 - 400 K/uL 244 243 255  NEUTROABS 1.7 - 7.7 K/uL - - -  LYMPHSABS 0.7 - 4.0 K/uL - - -     There is no height or weight on file to calculate BMI.  Orders:  No orders of the defined types were placed in this encounter.  No orders of the defined types were placed in this encounter.    Procedures: No procedures performed  Clinical Data: No additional findings.  ROS:  All other systems negative, except as noted in the HPI. Review of Systems  Objective: Vital Signs:  There were no vitals taken for this visit.  Specialty Comments:  No specialty comments available.  PMFS History: Patient Active Problem List   Diagnosis Date Noted   Foot pain, right 05/08/2021   Charcot's joint, right ankle and foot    Allergic rhinitis 03/17/2021   Anxiety 03/17/2021   Diabetic polyneuropathy (HCC) 03/17/2021   External hemorrhoids 03/17/2021   History of anemia 03/17/2021   Hyperglycemia due to type 1 diabetes mellitus (HCC) 03/17/2021   Long term (current) use of insulin (HCC) 03/17/2021   Personal history of other diseases of the respiratory system 03/17/2021   Recurrent major depression (HCC) 03/17/2021   Proliferative diabetic retinopathy of right eye without macular edema associated with type 1 diabetes mellitus  (HCC) 12/25/2020   Stable treated proliferative diabetic retinopathy of left eye with macular edema determined by examination associated with type 1 diabetes mellitus (HCC) 12/23/2020   Vitreous hemorrhage, right eye (HCC) 12/23/2020   Cellulitis 06/11/2020   Diabetic foot infection (HCC) 06/10/2020   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 03/05/2019   Special screening for malignant neoplasms, colon 01/24/2017   Difficulty in swallowing 01/24/2017   Esophageal reflux 01/24/2017   DM (diabetes mellitus) type 1 with ketoacidosis (HCC) 03/01/2015   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) 03/01/2015   Diabetic neuropathy (HCC) 03/01/2015   Hypothyroidism 03/01/2015   Depression 03/01/2015   Orthostatic hypotension 03/01/2015   Essential tremor 03/01/2015   Diabetic retinopathy (HCC) 03/01/2015   Gastroparesis due to DM (HCC) 03/01/2015   Chronic renal insufficiency 03/01/2015   Foot drop, bilateral 03/01/2015   HLD (hyperlipidemia) 03/01/2015   Obstructive sleep apnea 03/01/2015   Past Medical History:  Diagnosis Date   Adhesive capsulitis 2013   Bilateral shoulders   Adhesive capsulitis of both shoulders 2013   Anemia    Cataract    Chronic kidney disease    Depression    Diabetes mellitus without complication (HCC)    Diabetic retinopathy (HCC) 2010   Diabetic retinopathy (HCC) 2010   Left eye, mild   Essential tremor 2011   Bilateral hands   Foot drop, bilateral 2007   Foot fracture, left    Gastroparesis 2009   GERD (gastroesophageal reflux disease)    Headache    Hemorrhage 2011   Laser surgery x2   High potassium 2012   Normal as of 2014   Hypercholesterolemia 2010   Hyperlipidemia    Neuromuscular disorder (HCC)    patient states that she has a body twitch.   Pneumonia    Postnasal drip 2010   Scratched cornea 2013   Left eye   Sleep apnea    does not wear CPAP   Stage 3 chronic kidney disease (HCC)    Thyroid disease     TMJ (temporomandibular joint disorder)    Type 1 diabetes (HCC)     Family History  Problem Relation Age of Onset   Hypertension Mother    Diabetes Father    COPD Father    Pneumonia Father    Stroke Maternal Grandmother    Hypertension Maternal Grandmother    Diabetes Maternal Grandfather    Parkinson's disease Maternal Grandfather    Heart disease Maternal Grandfather    Parkinsonism Neg Hx    Neuropathy Neg Hx    Breast cancer Neg Hx     Past Surgical History:  Procedure Laterality Date   ABDOMINAL HYSTERECTOMY  2007   AMPUTATION Right 05/08/2021   Procedure: RIGHT BELOW KNEE AMPUTATION;  Surgeon: [name], MD;  Location: [facility] OR;  Service: Orthopedics;  Laterality: Right;   APPENDECTOMY     BALLOON DILATION N/A 01/24/2017   Procedure: BALLOON DILATION;  Surgeon: Charlott Rakes, MD;  [facility];  Service: Endoscopy;  Laterality: N/A;   CARPAL TUNNEL RELEASE Right 1990   CATARACT EXTRACTION Bilateral 2008   COLONOSCOPY  2007   COLONOSCOPY WITH PROPOFOL N/A 01/24/2017   Procedure: COLONOSCOPY WITH PROPOFOL;  Surgeon: Charlott Rakes, MD;  [facility];  Service: Endoscopy;  Laterality: N/A;   ESOPHAGOGASTRODUODENOSCOPY (EGD) WITH PROPOFOL N/A 01/24/2017   Procedure: ESOPHAGOGASTRODUODENOSCOPY (EGD) WITH PROPOFOL;  Surgeon: Charlott Rakes, MD;  [facility];  Service: Endoscopy;  Laterality: N/A;  moved oer MD request   EYE SURGERY Left 2012   Pars plana viterctomy, membrane peel, endolaser   ROOT CANAL  2007   ROOT CANAL  2010   Thorat dilation  2007   Throat dilation  2007   Throat dilation  2010, 0ct 2011   Sep 2010, Nov 2010, 2014   TONSILLECTOMY  1971   Trigger fingers  2008   WISDOM TOOTH EXTRACTION  1977   Social History   Occupational History   Not on file  Tobacco Use   Smoking status: Never   Smokeless tobacco: Never  Vaping Use   Vaping Use: Never used  Substance and Sexual Activity   Alcohol use: No     Alcohol/week: 0.0 standard drinks   Drug use: Yes   Sexual activity: Not on file

## 2021-07-28 ENCOUNTER — Encounter: Payer: Self-pay | Admitting: Physical Therapy

## 2021-07-28 ENCOUNTER — Other Ambulatory Visit: Payer: Self-pay

## 2021-07-28 ENCOUNTER — Ambulatory Visit (INDEPENDENT_AMBULATORY_CARE_PROVIDER_SITE_OTHER): Payer: Managed Care, Other (non HMO) | Admitting: Physical Therapy

## 2021-07-28 DIAGNOSIS — M6281 Muscle weakness (generalized): Secondary | ICD-10-CM

## 2021-07-28 DIAGNOSIS — M21372 Foot drop, left foot: Secondary | ICD-10-CM

## 2021-07-28 DIAGNOSIS — R2681 Unsteadiness on feet: Secondary | ICD-10-CM | POA: Diagnosis not present

## 2021-07-28 DIAGNOSIS — R2689 Other abnormalities of gait and mobility: Secondary | ICD-10-CM | POA: Diagnosis not present

## 2021-07-28 DIAGNOSIS — Z9181 History of falling: Secondary | ICD-10-CM

## 2021-07-28 DIAGNOSIS — M25551 Pain in right hip: Secondary | ICD-10-CM

## 2021-07-28 DIAGNOSIS — M6249 Contracture of muscle, multiple sites: Secondary | ICD-10-CM

## 2021-07-28 DIAGNOSIS — R293 Abnormal posture: Secondary | ICD-10-CM

## 2021-07-28 DIAGNOSIS — M25552 Pain in left hip: Secondary | ICD-10-CM

## 2021-07-29 NOTE — Therapy (Signed)
[facility] [address] [place], [place], [address] Phone: [phone]   Fax:  [phone]  Physical Therapy Treatment  Patient Details  Name: [name] MRN: [id number] Date of Birth: 1961/11/08 Referring Provider (PT): [name], [place]   Encounter Date: 07/28/2021   PT End of Session - 07/29/21 0755     Visit Number 2    Number of Visits 25    Date for PT Re-Evaluation 10/15/21    Authorization Type [employer] Managed    Authorization Time Period $50 COPAY, OOP $4000 $2719.68 MET, 60 DAYS PER CALENDER YEAR, PT/OT/ CHIRO, AFTER OOP MET    PT Start Time 1600    PT Stop Time 5726    PT Time Calculation (min) 41 min    Activity Tolerance Patient tolerated treatment well;Patient limited by fatigue;Patient limited by pain    Behavior During Therapy [facility] for tasks assessed/performed             Past Medical History:  Diagnosis Date   Adhesive capsulitis 2013   Bilateral shoulders   Adhesive capsulitis of both shoulders 2013   Anemia    Cataract    Chronic kidney disease    Depression    Diabetes mellitus without complication ([place])    Diabetic retinopathy ([place]) 2010   Diabetic retinopathy ([place]) 2010   Left eye, mild   Essential tremor 2011   Bilateral hands   Foot drop, bilateral 2007   Foot fracture, left    Gastroparesis 2009   GERD (gastroesophageal reflux disease)    Headache    Hemorrhage 2011   Laser surgery x2   High potassium 2012   Normal as of 2014   Hypercholesterolemia 2010   Hyperlipidemia    Neuromuscular disorder ([place])    patient states that she has a body twitch.   Pneumonia    Postnasal drip 2010   Scratched cornea 2013   Left eye   Sleep apnea    does not wear CPAP   Stage 3 chronic kidney disease (HCC)    Thyroid disease    TMJ (temporomandibular joint disorder)    Type 1 diabetes ([place])     Past Surgical History:  Procedure Laterality Date   ABDOMINAL HYSTERECTOMY  2007   AMPUTATION Right  05/08/2021   Procedure: RIGHT BELOW KNEE AMPUTATION;  Surgeon: [name] Minion, MD;  Location: [place];  Service: Orthopedics;  Laterality: Right;   APPENDECTOMY     BALLOON DILATION N/A 01/24/2017   Procedure: BALLOON DILATION;  Surgeon: [name] Corner, MD;  Location: [facility];  Service: Endoscopy;  Laterality: N/A;   CARPAL TUNNEL RELEASE Right 1990   CATARACT EXTRACTION Bilateral 2008   COLONOSCOPY  2007   COLONOSCOPY WITH PROPOFOL N/A 01/24/2017   Procedure: COLONOSCOPY WITH PROPOFOL;  Surgeon: [name] Corner, MD;  Location: [facility];  Service: Endoscopy;  Laterality: N/A;   ESOPHAGOGASTRODUODENOSCOPY (EGD) WITH PROPOFOL N/A 01/24/2017   Procedure: ESOPHAGOGASTRODUODENOSCOPY (EGD) WITH PROPOFOL;  Surgeon: [name] Corner, MD;  Location: [facility];  Service: Endoscopy;  Laterality: N/A;  moved oer MD request   EYE SURGERY Left 2012   Pars plana viterctomy, membrane peel, endolaser   ROOT CANAL  2007   ROOT CANAL  2010   Thorat dilation  2007   Throat dilation  2007   Throat dilation  2010, 0ct 2011   Sep 2010, Nov 2010, 2014   TONSILLECTOMY  1971   Trigger fingers  2008   [place] EXTRACTION  [phone]  There were no vitals filed for this visit.   Subjective Assessment - 07/28/21 1600     Subjective She is wearing prosthesis 2-3 hours 1x/day.    Patient is accompained by: Family member   husband, [name]   Pertinent History Right TTA, CKD, DM1, retinopathy, bil. foot drop, HA    Limitations Standing;Walking;House hold activities    Patient Stated Goals To use prosthesis to be independent in home.    Currently in Pain? Yes    Pain Score 2     Pain Location Leg   residual limb   Pain Orientation Right;Anterior    Pain Descriptors / Indicators Aching;Sore    Pain Type Other (Comment)   prosthetic   Pain Onset More than a month ago    Pain Frequency Intermittent    Aggravating Factors  pressure from prosthesis    Pain Relieving Factors tylenol,                 [facility] PT Assessment - 07/28/21 1600       Assessment   Medical Diagnosis Right Transtibial Amputation    Referring Provider (PT) [name] Palmer Persons, PA    Onset Date/Surgical Date 07/15/21   prosthesis delivery     Transfers   Transfers Sit to Stand;Stand to Sit;Stand Pivot Transfers;Squat Pivot Transfers    Sit to Stand 4: Min guard;With upper extremity assist;With armrests;From chair/3-in-1;From elevated surface;Other (comment)   to RW for stabilization   Stand to Sit 5: Supervision;With upper extremity assist;With armrests;To chair/3-in-1;To elevated surface;Other (comment)   from [place] for stabilization   Stand Pivot Transfers 4: Min assist   with RW support, TTA prosthesis & AFO   Squat Pivot Transfers 4: Min guard;Without upper extremity assistance      Ambulation/Gait   Ambulation/Gait Yes    Ambulation/Gait Assistance 4: Min assist    Ambulation Distance (Feet) 47 Feet    Assistive device Rolling walker;Prosthesis;Other (Comment)   AFO   Gait Pattern Step-to pattern;Decreased stance time - right;Decreased step length - left;Decreased stride length;Decreased hip/knee flexion - right;Right hip hike;Left hip hike;Antalgic;Lateral hip instability;Trunk flexed    Ambulation Surface Level;Indoor      Balance   Balance Assessed Yes      Static Standing Balance   Static Standing - Balance Support No upper extremity supported    Static Standing - Level of Assistance 4: Min assist   min guard   Static Standing - Comment/# of Minutes 1 min      Dynamic Standing Balance   Dynamic Standing - Balance Support No upper extremity supported    Dynamic Standing - Level of Assistance 4: Min assist    Dynamic Standing - Balance Activities Head nods;Head turns;Eyes closed;Reaching for objects    Dynamic Standing - Comments eyes open head motions only no trunk motion, reaches 2" anteriorly,  eyes closed static 5 sec minA                           [facility] Adult PT  Treatment/Exercise - 07/28/21 1600       Exercises   Exercises Knee/Hip      Knee/Hip Exercises: Aerobic   Nustep Seat 8 level 5 with BLEs & BUEs for 8 min      Prosthetics   Prosthetic Care Comments  Increase wear to liner for 4hrs with prosthesis 2 of those 4hrs 2x/day. If in bed, wear liner only.    Current prosthetic wear tolerance (  days/week)  daily since PT eval 1 week ago    Current prosthetic wear tolerance (#hours/day)  2-3 hrs 1x/day    Current prosthetic weight-bearing tolerance (hours/day)  Pt tolerated standing with RW support & gait with RW for 3 minutes with no increase in limb pain. pain was 3-4/10 prior to standing.    Edema pitting    Residual limb condition  1cm scab laterally & 75m wound medially which may be internal suture working out, pale skin color, no hair growth, normal mositure & temperature, cylinderical shape.    Education Provided Skin check;Residual limb care;Proper Donning;Proper wear schedule/adjustment;Other (comment)   see prosthetic care comments   Person(s) Educated Patient;Spouse    Education Method Explanation;Demonstration;Verbal cues    Education Method Verbalized understanding;Returned demonstration;Tactile cues required;Verbal cues required;Needs further instruction                      PT Short Term Goals - 07/28/21 1901       PT SHORT TERM GOAL #1   Title Patient's family demonstrate proper donning & verbalize proper cleaning of prosthesis.    Time 4    Period Weeks    Status On-going    Target Date 08/20/21      PT SHORT TERM GOAL #2   Title Patient tolerates prosthesis or prosthetic liner if in bed wear >8hrs total per day without increase in skin issues.    Time 4    Period Weeks    Status On-going    Target Date 08/20/21      PT SHORT TERM GOAL #3   Title Patient sit to/from stand w/c to rollator walker with supervision.    Time 3    Period Weeks    Status New    Target Date 08/20/21      PT SHORT TERM GOAL  #4   Title Patient ambulates 538 with rollator walker, TTA prosthesis & AFO with min guard.    Time 3    Period Weeks    Status New    Target Date 08/20/21               PT Long Term Goals - 07/28/21 1903       PT LONG TERM GOAL #1   Title Patient & family verbalize & demonstrate proper prosthetic care to enable safe utilization of prosthesis.    Time 12    Period Weeks    Status On-going    Target Date 10/15/21      PT LONG TERM GOAL #2   Title Patient tolerates prosthesis wear >/= 12 hours /day (if in bed during day time, then tolerates liner wear for quick prosthetic wear for transfers) without skin or limb pain issues.    Time 12    Period Weeks    Status On-going    Target Date 10/15/21      PT LONG TERM GOAL #3   Title scooting or squat pivot transfers w/c to mat or another chair with prosthesis with supervision.    Time 12    Period Weeks    Status On-going    Target Date 10/15/21      PT LONG TERM GOAL #4   Title Standing balance with rollator walker, TTA prosthesis & AFO, reaches 10" anteriorly, scans environment & manages clothes for toileting modified independent.    Time 11    Period Weeks    Status New    Target Date 10/15/21  PT LONG TERM GOAL #5   Title Patient ambulates 98' with rollator walker, TTA prosthesis & AFO modified independent for mobility in home.    Time 11    Period Weeks    Status New    Target Date 10/15/21                   Plan - 07/28/21 1900     Clinical Impression Statement PT was able to assess transfers, balance & gait with prosthesis.  Initially based on patient & husband's report of prior level of function, PT anticipated more impairments than noted.  She has potential for household gait with rollator walker with PT instruction.  She is tolerating progressive increases in prosthesis wear but some muscle soreness & distal limb pressure so PT will need to progress slowly.    Personal Factors and Comorbidities  Comorbidity 3+;Fitness;Past/Current Experience    Comorbidities Right TTA, CKD, DM1, retinopathy, neuropathy, bil. foot drop, HA    Examination-Activity Limitations Bed Mobility;Locomotion Level;Stand;Toileting;Transfers    Stability/Clinical Decision Making Evolving/Moderate complexity    Rehab Potential Good    PT Frequency 2x / week    PT Duration 12 weeks    PT Treatment/Interventions ADLs/Self Care Home Management;DME Instruction;Gait training;Stair training;Functional mobility training;Therapeutic activities;Therapeutic exercise;Balance training;Neuromuscular re-education;Patient/family education;Prosthetic Training;Vestibular    PT Next Visit Plan work on  standing balance & prosthetic gait with rollator walker, review prosthetic care & progress wear, check wounds,    Consulted and Agree with Plan of Care Patient             Patient will benefit from skilled therapeutic intervention in order to improve the following deficits and impairments:  Abnormal gait, Decreased activity tolerance, Decreased balance, Decreased coordination, Decreased endurance, Decreased knowledge of use of DME, Decreased mobility, Decreased range of motion, Decreased skin integrity, Decreased strength, Difficulty walking, Increased edema, Impaired flexibility, Postural dysfunction, Prosthetic Dependency  Visit Diagnosis: Muscle weakness (generalized)  Other abnormalities of gait and mobility  Unsteadiness on feet  History of falling  Abnormal posture  Contracture of muscle, multiple sites  Pain in left hip  Pain in right hip  Foot drop, left     Problem List Patient Active Problem List   Diagnosis Date Noted   Foot pain, right 05/08/2021   Charcot's joint, right ankle and foot    Allergic rhinitis 03/17/2021   Anxiety 03/17/2021   Diabetic polyneuropathy ([place]) 03/17/2021   External hemorrhoids 03/17/2021   History of anemia 03/17/2021   Hyperglycemia due to type 1 diabetes mellitus  ([place]) 03/17/2021   Long term (current) use of insulin ([place]) 03/17/2021   Personal history of other diseases of the respiratory system 03/17/2021   Recurrent major depression ([place]) 03/17/2021   Proliferative diabetic retinopathy of right eye without macular edema associated with type 1 diabetes mellitus ([place]) 12/25/2020   Stable treated proliferative diabetic retinopathy of left eye with macular edema determined by examination associated with type 1 diabetes mellitus ([place]) 12/23/2020   Vitreous hemorrhage, right eye ([place]) 12/23/2020   Cellulitis 06/11/2020   Diabetic foot infection ([place]) 06/10/2020   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 03/05/2019   Special screening for malignant neoplasms, colon 01/24/2017   Difficulty in swallowing 01/24/2017   Esophageal reflux 01/24/2017   DM (diabetes mellitus) type 1 with ketoacidosis ([place]) 03/01/2015   Diabetes mellitus type 1 with complications ([place]) 90/38/3338   Diabetic neuropathy ([place]) 03/01/2015   Hypothyroidism 03/01/2015   Depression 03/01/2015   Orthostatic hypotension 03/01/2015  Essential tremor 03/01/2015   Diabetic retinopathy ([place]) 03/01/2015   Gastroparesis due to DM ([place]) 03/01/2015   Chronic renal insufficiency 03/01/2015   Foot drop, bilateral 03/01/2015   HLD (hyperlipidemia) 03/01/2015   Obstructive sleep apnea 03/01/2015    Jamey Reas, PT, DPT 07/29/2021, 9:08 AM  [facility] [address] [place], [place], [address] Phone: [phone]   Fax:  [phone]  Name: [name] MRN: [id number] Date of Birth: [birth year]

## 2021-08-04 ENCOUNTER — Ambulatory Visit (INDEPENDENT_AMBULATORY_CARE_PROVIDER_SITE_OTHER): Payer: Managed Care, Other (non HMO) | Admitting: Physical Therapy

## 2021-08-04 ENCOUNTER — Ambulatory Visit (INDEPENDENT_AMBULATORY_CARE_PROVIDER_SITE_OTHER): Payer: Managed Care, Other (non HMO) | Admitting: Orthopedic Surgery

## 2021-08-04 ENCOUNTER — Telehealth: Payer: Self-pay | Admitting: Physical Therapy

## 2021-08-04 ENCOUNTER — Other Ambulatory Visit: Payer: Self-pay

## 2021-08-04 ENCOUNTER — Encounter: Payer: Self-pay | Admitting: Physical Therapy

## 2021-08-04 DIAGNOSIS — R2681 Unsteadiness on feet: Secondary | ICD-10-CM

## 2021-08-04 DIAGNOSIS — R293 Abnormal posture: Secondary | ICD-10-CM

## 2021-08-04 DIAGNOSIS — M25551 Pain in right hip: Secondary | ICD-10-CM

## 2021-08-04 DIAGNOSIS — R2689 Other abnormalities of gait and mobility: Secondary | ICD-10-CM

## 2021-08-04 DIAGNOSIS — M6249 Contracture of muscle, multiple sites: Secondary | ICD-10-CM

## 2021-08-04 DIAGNOSIS — M6281 Muscle weakness (generalized): Secondary | ICD-10-CM

## 2021-08-04 DIAGNOSIS — M21372 Foot drop, left foot: Secondary | ICD-10-CM

## 2021-08-04 DIAGNOSIS — Z9181 History of falling: Secondary | ICD-10-CM

## 2021-08-04 DIAGNOSIS — S88111A Complete traumatic amputation at level between knee and ankle, right lower leg, initial encounter: Secondary | ICD-10-CM

## 2021-08-04 DIAGNOSIS — M25552 Pain in left hip: Secondary | ICD-10-CM

## 2021-08-04 NOTE — Telephone Encounter (Signed)
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  She described a model that she has at home that requires her to press down to activate the brakes which would not be safe.  Can you please write orders for a 4-wheeled rollator walker with seat? Thank you Zella Ball

## 2021-08-04 NOTE — Therapy (Signed)
[facility] [address] [place], [place], [address] Phone: [phone]   Fax:  [phone]  Physical Therapy Treatment  Patient Details  Name: [name] MRN: [id number] Date of Birth: 02-16-1961 Referring Provider (PT): Bevely Palmer Persons, [place]   Encounter Date: 08/04/2021   PT End of Session - 08/04/21 1719     Visit Number 3    Number of Visits 25    Date for PT Re-Evaluation 10/15/21    Authorization Type [employer] Managed    Authorization Time Period $50 COPAY, OOP $4000 $2719.68 MET, 60 DAYS PER CALENDER YEAR, PT/OT/ CHIRO, AFTER OOP MET    PT Start Time 1628    PT Stop Time 1707    PT Time Calculation (min) 39 min    Activity Tolerance Patient tolerated treatment well;Patient limited by fatigue;Patient limited by pain    Behavior During Therapy [facility] for tasks assessed/performed             Past Medical History:  Diagnosis Date   Adhesive capsulitis 2013   Bilateral shoulders   Adhesive capsulitis of both shoulders 2013   Anemia    Cataract    Chronic kidney disease    Depression    Diabetes mellitus without complication ([place])    Diabetic retinopathy ([place]) 2010   Diabetic retinopathy ([place]) 2010   Left eye, mild   Essential tremor 2011   Bilateral hands   Foot drop, bilateral 2007   Foot fracture, left    Gastroparesis 2009   GERD (gastroesophageal reflux disease)    Headache    Hemorrhage 2011   Laser surgery x2   High potassium 2012   Normal as of 2014   Hypercholesterolemia 2010   Hyperlipidemia    Neuromuscular disorder ([place])    patient states that she has a body twitch.   Pneumonia    Postnasal drip 2010   Scratched cornea 2013   Left eye   Sleep apnea    does not wear CPAP   Stage 3 chronic kidney disease (HCC)    Thyroid disease    TMJ (temporomandibular joint disorder)    Type 1 diabetes ([place])     Past Surgical History:  Procedure Laterality Date   ABDOMINAL HYSTERECTOMY  2007   AMPUTATION Right  05/08/2021   Procedure: RIGHT BELOW KNEE AMPUTATION;  Surgeon: Newt Minion, MD;  Location: [facility];  Service: Orthopedics;  Laterality: Right;   APPENDECTOMY     BALLOON DILATION N/A 01/24/2017   Procedure: BALLOON DILATION;  Surgeon: Wilford Corner, MD;  [facility];  Service: Endoscopy;  Laterality: N/A;   CARPAL TUNNEL RELEASE Right 1990   CATARACT EXTRACTION Bilateral 2008   COLONOSCOPY  2007   COLONOSCOPY WITH PROPOFOL N/A 01/24/2017   Procedure: COLONOSCOPY WITH PROPOFOL;  Surgeon: Wilford Corner, MD;  [facility];  Service: Endoscopy;  Laterality: N/A;   ESOPHAGOGASTRODUODENOSCOPY (EGD) WITH PROPOFOL N/A 01/24/2017   Procedure: ESOPHAGOGASTRODUODENOSCOPY (EGD) WITH PROPOFOL;  Surgeon: Wilford Corner, MD;  [facility];  Service: Endoscopy;  Laterality: N/A;  moved oer MD request   EYE SURGERY Left 2012   Pars plana viterctomy, membrane peel, endolaser   ROOT CANAL  2007   ROOT CANAL  2010   Thorat dilation  2007   Throat dilation  2007   Throat dilation  2010, 0ct 2011   Sep 2010, Nov 2010, 2014   TONSILLECTOMY  1971   Trigger fingers  2008   [place] EXTRACTION  [phone]  There were no vitals filed for this visit.   Subjective Assessment - 08/04/21 1630     Subjective She saw Dr. Sharol Given prior to PT today who removed scab & suture from residual limb.  Her left great toe is healing well.    Patient is accompained by: Family member   husband, Cyrena Kuchenbecker   Pertinent History Right TTA, CKD, DM1, retinopathy, bil. foot drop, HA    Limitations Standing;Walking;House hold activities    Patient Stated Goals To use prosthesis to be independent in home.    Currently in Pain? No/denies    Pain Onset More than a month ago                               [facility] Adult PT Treatment/Exercise - 08/04/21 1628       Transfers   Transfers Sit to Stand;Stand to Sit;Stand Pivot Transfers;Squat Pivot Transfers    Sit to Stand 4: Min  guard;With upper extremity assist;With armrests;From chair/3-in-1;From elevated surface;Other (comment);5: Supervision   to RW or locked rollator walker for stabilization   Sit to Stand Details Visual cues for safe use of DME/AE;Visual cues/gestures for sequencing;Verbal cues for technique    Stand to Sit 5: Supervision;With upper extremity assist;With armrests;To chair/3-in-1;To elevated surface;Other (comment)   from RW or locked rollator walker for stabilization   Stand to Sit Details (indicate cue type and reason) Visual cues for safe use of DME/AE;Visual cues/gestures for sequencing;Verbal cues for technique    Stand Pivot Transfer Details (indicate cue type and reason) PT demo & verbal cues on technique for sit to/from stand to rollator seat.  Not enough time to work on activity today.      Ambulation/Gait   Ambulation/Gait Yes    Ambulation/Gait Assistance 4: Min assist    Ambulation/Gait Assistance Details PT demo & verbal cues on rollator walker use & safety.  Tactile cues during gait for upright posture & verbal cues to focus on stationary object directily in front of her.    Ambulation Distance (Feet) 75 Feet   75' X 2   Assistive device Rollator;Prosthesis;Other (Comment)   AFO   Ambulation Surface Level;Indoor      Exercises   Exercises Knee/Hip      Prosthetics   Prosthetic Care Comments  Continue liner wear most of awake hours and prosthesis when out of bed up to 4 hrs.    Current prosthetic wear tolerance (days/week)  daily    Current prosthetic wear tolerance (#hours/day)  liner most of awake hours & prosthesis 2-3 hours 2-3 times / day    Current prosthetic weight-bearing tolerance (hours/day)  Pt tolerated standing with RW support & gait with RW for 3 minutes with no increase in limb pain. limb pain was 3-4/10 prior to standing.    Edema pitting    Education Provided Proper wear schedule/adjustment;Other (comment)   see prosthetic care comments   Person(s) Educated  Patient;Spouse    Education Method Explanation;Verbal cues    Education Method Verbalized understanding;Verbal cues required;Needs further instruction                  Upper Extremity Functional Index Score :   /80     PT Short Term Goals - 07/28/21 1901       PT SHORT TERM GOAL #1   Title Patient's family demonstrate proper donning & verbalize proper cleaning of prosthesis.    Time 4  Period Weeks    Status On-going    Target Date 08/20/21      PT SHORT TERM GOAL #2   Title Patient tolerates prosthesis or prosthetic liner if in bed wear >8hrs total per day without increase in skin issues.    Time 4    Period Weeks    Status On-going    Target Date 08/20/21      PT SHORT TERM GOAL #3   Title Patient sit to/from stand w/c to rollator walker with supervision.    Time 3    Period Weeks    Status New    Target Date 08/20/21      PT SHORT TERM GOAL #4   Title Patient ambulates 18' with rollator walker, TTA prosthesis & AFO with min guard.    Time 3    Period Weeks    Status New    Target Date 08/20/21               PT Long Term Goals - 07/28/21 1903       PT LONG TERM GOAL #1   Title Patient & family verbalize & demonstrate proper prosthetic care to enable safe utilization of prosthesis.    Time 12    Period Weeks    Status On-going    Target Date 10/15/21      PT LONG TERM GOAL #2   Title Patient tolerates prosthesis wear >/= 12 hours /day (if in bed during day time, then tolerates liner wear for quick prosthetic wear for transfers) without skin or limb pain issues.    Time 12    Period Weeks    Status On-going    Target Date 10/15/21      PT LONG TERM GOAL #3   Title scooting or squat pivot transfers w/c to mat or another chair with prosthesis with supervision.    Time 12    Period Weeks    Status On-going    Target Date 10/15/21      PT LONG TERM GOAL #4   Title Standing balance with rollator walker, TTA prosthesis & AFO, reaches 10"  anteriorly, scans environment & manages clothes for toileting modified independent.    Time 11    Period Weeks    Status New    Target Date 10/15/21      PT LONG TERM GOAL #5   Title Patient ambulates 40' with rollator walker, TTA prosthesis & AFO modified independent for mobility in home.    Time 11    Period Weeks    Status New    Target Date 10/15/21                   Plan - 08/04/21 1720     Clinical Impression Statement PT instructed in sit to/from stand technique from chairs without armrests using UEs. She improved with instruction & practice.  Her husband verbalized a good understanding to enable carryover to home.  Pt appears safe with rollator walker with PT or family assistance.  Her rollator walker does not sound like a safe model based on description that she has to press down to activate brakes.    Personal Factors and Comorbidities Comorbidity 3+;Fitness;Past/Current Experience    Comorbidities Right TTA, CKD, DM1, retinopathy, neuropathy, bil. foot drop, HA    Examination-Activity Limitations Bed Mobility;Locomotion Level;Stand;Toileting;Transfers    Stability/Clinical Decision Making Evolving/Moderate complexity    Rehab Potential Good    PT Frequency 2x / week    PT  Duration 12 weeks    PT Treatment/Interventions ADLs/Self Care Home Management;DME Instruction;Gait training;Stair training;Functional mobility training;Therapeutic activities;Therapeutic exercise;Balance training;Neuromuscular re-education;Patient/family education;Prosthetic Training;Vestibular    PT Next Visit Plan work on  standing balance & prosthetic gait with rollator walker including sit to/from stand from seat, review prosthetic care & progress wear, check wounds, Nustep    Consulted and Agree with Plan of Care Patient;Family member/caregiver    Family Member Consulted husband             Patient will benefit from skilled therapeutic intervention in order to improve the following  deficits and impairments:  Abnormal gait, Decreased activity tolerance, Decreased balance, Decreased coordination, Decreased endurance, Decreased knowledge of use of DME, Decreased mobility, Decreased range of motion, Decreased skin integrity, Decreased strength, Difficulty walking, Increased edema, Impaired flexibility, Postural dysfunction, Prosthetic Dependency  Visit Diagnosis: Muscle weakness (generalized)  Other abnormalities of gait and mobility  Unsteadiness on feet  History of falling  Abnormal posture  Contracture of muscle, multiple sites  Foot drop, left  Pain in left hip  Pain in right hip     Problem List Patient Active Problem List   Diagnosis Date Noted   Foot pain, right 05/08/2021   Charcot's joint, right ankle and foot    Allergic rhinitis 03/17/2021   Anxiety 03/17/2021   Diabetic polyneuropathy ([place]) 03/17/2021   External hemorrhoids 03/17/2021   History of anemia 03/17/2021   Hyperglycemia due to type 1 diabetes mellitus ([place]) 03/17/2021   Long term (current) use of insulin ([place]) 03/17/2021   Personal history of other diseases of the respiratory system 03/17/2021   Recurrent major depression ([place]) 03/17/2021   Proliferative diabetic retinopathy of right eye without macular edema associated with type 1 diabetes mellitus ([place]) 12/25/2020   Stable treated proliferative diabetic retinopathy of left eye with macular edema determined by examination associated with type 1 diabetes mellitus ([place]) 12/23/2020   Vitreous hemorrhage, right eye ([place]) 12/23/2020   Cellulitis 06/11/2020   Diabetic foot infection ([place]) 06/10/2020   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 03/05/2019   Special screening for malignant neoplasms, colon 01/24/2017   Difficulty in swallowing 01/24/2017   Esophageal reflux 01/24/2017   DM (diabetes mellitus) type 1 with ketoacidosis ([place]) 03/01/2015   Diabetes mellitus type 1 with complications ([place]) 37/07/6437    Diabetic neuropathy ([place]) 03/01/2015   Hypothyroidism 03/01/2015   Depression 03/01/2015   Orthostatic hypotension 03/01/2015   Essential tremor 03/01/2015   Diabetic retinopathy ([place]) 03/01/2015   Gastroparesis due to DM ([place]) 03/01/2015   Chronic renal insufficiency 03/01/2015   Foot drop, bilateral 03/01/2015   HLD (hyperlipidemia) 03/01/2015   Obstructive sleep apnea 03/01/2015    Jamey Reas, PT, DPT 08/04/2021, 5:24 PM  [facility] [address] [place], [place], [address] Phone: [phone]   Fax:  [phone]  Name: [name] MRN: [id number] Date of Birth: [birth year]

## 2021-08-05 NOTE — Telephone Encounter (Signed)
Order has been accepted by [employer] and will continue to monitor for processing.

## 2021-08-05 NOTE — Telephone Encounter (Signed)
Order entered in [employer] will hold this message to monitor approval process.

## 2021-08-06 NOTE — Telephone Encounter (Signed)
Checked status this morning and it is being reviewed by [employer] and will continue to monitor for approval.

## 2021-08-07 NOTE — Telephone Encounter (Signed)
I checked today and [employer] has sent the order for the rollator to a third party for processing and should contact the pt about any out of pocket expense and delivery. Let me know if you need anything else! thanks

## 2021-08-10 ENCOUNTER — Encounter: Payer: Self-pay | Admitting: Orthopedic Surgery

## 2021-08-10 NOTE — Progress Notes (Signed)
Office Visit Note   Patient: [name]           Date of Birth: [birth year]           MRN: [id number] Visit Date: 08/04/2021              Requested by: Soundra Pilon, [name] [address] [place],  [place] [address] PCP: Soundra Pilon, [name]  Chief Complaint  Patient presents with   Right Leg - Routine Post Op    05/08/21 right BKA       HPI: Patient is a 60 year old [name] thesis this afternoon.  Has therapy she currently uses a ankle-foot orthosis on the left woman who is 3 months status post right transtibial amputation.  She is currently  Assessment & Plan: Visit Diagnoses:  1. Below-knee amputation of right lower extremity (HCC)     Plan: A suture knot was removed medially.  Patient will continue with therapy and follow-up for routine care in about 3 months  Follow-Up Instructions: Return if symptoms worsen or fail to improve.   Ortho Exam  Patient is alert, oriented, no adenopathy, well-dressed, normal affect, normal respiratory effort. Examination the left foot has no cellulitis there is no signs of infection after her nail was removed.  There is a suture knot on the medial aspect of the transtibial amputation and this was removed.  Imaging: No results found. No images are attached to the encounter.  Labs: Lab Results  Component Value Date   HGBA1C 7.0 (H) 05/08/2021   HGBA1C 5.0 06/10/2020   ESRSEDRATE 65 (H) 06/10/2020   CRP 6.3 (H) 06/10/2020   REPTSTATUS 06/15/2020 FINAL 06/10/2020   CULT  06/10/2020    NO GROWTH 5 DAYS Performed at [facility], [address], [place], [place] [address]      Lab Results  Component Value Date   ALBUMIN 3.4 (L) 06/13/2020   ALBUMIN 3.2 (L) 06/12/2020   ALBUMIN 3.0 (L) 06/11/2020   PREALBUMIN 12.6 (L) 06/10/2020    Lab Results  Component Value Date   MG 2.4 06/13/2020   MG 2.3 06/12/2020   No results found for: [facility]  Lab Results  Component Value Date   PREALBUMIN 12.6 (L) 06/10/2020   CBC  EXTENDED Latest Ref Rng & Units 05/10/2021 05/09/2021 05/08/2021  WBC 4.0 - 10.5 K/uL 11.1(H) 13.3(H) 11.2(H)  RBC 3.87 - 5.11 MIL/uL 3.74(L) 3.76(L) 4.08  HGB 12.0 - 15.0 g/dL 11.3(L) 11.5(L) 12.4  HCT 36.0 - 46.0 % 36.7 37.0 40.0  PLT 150 - 400 K/uL 244 243 255  NEUTROABS 1.7 - 7.7 K/uL - - -  LYMPHSABS 0.7 - 4.0 K/uL - - -     There is no height or weight on file to calculate BMI.  Orders:  No orders of the defined types were placed in this encounter.  No orders of the defined types were placed in this encounter.    Procedures: No procedures performed  Clinical Data: No additional findings.  ROS:  All other systems negative, except as noted in the HPI. Review of Systems  Objective: Vital Signs: There were no vitals taken for this visit.  Specialty Comments:  No specialty comments available.  PMFS History: Patient Active Problem List   Diagnosis Date Noted   Foot pain, right 05/08/2021   Charcot's joint, right ankle and foot    Allergic rhinitis 03/17/2021   Anxiety 03/17/2021   Diabetic polyneuropathy (HCC) 03/17/2021   External hemorrhoids 03/17/2021  History of anemia 03/17/2021   Hyperglycemia due to type 1 diabetes mellitus (HCC) 03/17/2021   Long term (current) use of insulin (HCC) 03/17/2021   Personal history of other diseases of the respiratory system 03/17/2021   Recurrent major depression (HCC) 03/17/2021   Proliferative diabetic retinopathy of right eye without macular edema associated with type 1 diabetes mellitus (HCC) 12/25/2020   Stable treated proliferative diabetic retinopathy of left eye with macular edema determined by examination associated with type 1 diabetes mellitus (HCC) 12/23/2020   Vitreous hemorrhage, right eye (HCC) 12/23/2020   Cellulitis 06/11/2020   Diabetic foot infection (HCC) 06/10/2020   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 03/05/2019   Special screening for malignant neoplasms, colon 01/24/2017    Difficulty in swallowing 01/24/2017   Esophageal reflux 01/24/2017   DM (diabetes mellitus) type 1 with ketoacidosis (HCC) 03/01/2015   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) 03/01/2015   Diabetic neuropathy (HCC) 03/01/2015   Hypothyroidism 03/01/2015   Depression 03/01/2015   Orthostatic hypotension 03/01/2015   Essential tremor 03/01/2015   Diabetic retinopathy (HCC) 03/01/2015   Gastroparesis due to DM (HCC) 03/01/2015   Chronic renal insufficiency 03/01/2015   Foot drop, bilateral 03/01/2015   HLD (hyperlipidemia) 03/01/2015   Obstructive sleep apnea 03/01/2015   Past Medical History:  Diagnosis Date   Adhesive capsulitis 2013   Bilateral shoulders   Adhesive capsulitis of both shoulders 2013   Anemia    Cataract    Chronic kidney disease    Depression    Diabetes mellitus without complication (HCC)    Diabetic retinopathy (HCC) 2010   Diabetic retinopathy (HCC) 2010   Left eye, mild   Essential tremor 2011   Bilateral hands   Foot drop, bilateral 2007   Foot fracture, left    Gastroparesis 2009   GERD (gastroesophageal reflux disease)    Headache    Hemorrhage 2011   Laser surgery x2   High potassium 2012   Normal as of 2014   Hypercholesterolemia 2010   Hyperlipidemia    Neuromuscular disorder (HCC)    patient states that she has a body twitch.   Pneumonia    Postnasal drip 2010   Scratched cornea 2013   Left eye   Sleep apnea    does not wear CPAP   Stage 3 chronic kidney disease (HCC)    Thyroid disease    TMJ (temporomandibular joint disorder)    Type 1 diabetes (HCC)     Family History  Problem Relation Age of Onset   Hypertension Mother    Diabetes Father    COPD Father    Pneumonia Father    Stroke Maternal Grandmother    Hypertension Maternal Grandmother    Diabetes Maternal Grandfather    Parkinson's disease Maternal Grandfather    Heart disease Maternal Grandfather    Parkinsonism Neg Hx    Neuropathy Neg Hx    Breast  cancer Neg Hx     Past Surgical History:  Procedure Laterality Date   ABDOMINAL HYSTERECTOMY  2007   AMPUTATION Right 05/08/2021   Procedure: RIGHT BELOW KNEE AMPUTATION;  Surgeon: [name], MD;  Location: [facility];  Service: Orthopedics;  Laterality: Right;   APPENDECTOMY     BALLOON DILATION N/A 01/24/2017   Procedure: BALLOON DILATION;  Surgeon: Charlott Rakes, MD;  [facility];  Service: Endoscopy;  Laterality: N/A;   CARPAL TUNNEL RELEASE Right 1990   CATARACT EXTRACTION Bilateral 2008   COLONOSCOPY  2007  COLONOSCOPY WITH PROPOFOL N/A 01/24/2017   Procedure: COLONOSCOPY WITH PROPOFOL;  Surgeon: Charlott Rakes, MD;  [facility];  Service: Endoscopy;  Laterality: N/A;   ESOPHAGOGASTRODUODENOSCOPY (EGD) WITH PROPOFOL N/A 01/24/2017   Procedure: ESOPHAGOGASTRODUODENOSCOPY (EGD) WITH PROPOFOL;  Surgeon: Charlott Rakes, MD;  [facility];  Service: Endoscopy;  Laterality: N/A;  moved oer MD request   EYE SURGERY Left 2012   Pars plana viterctomy, membrane peel, endolaser   ROOT CANAL  2007   ROOT CANAL  2010   Thorat dilation  2007   Throat dilation  2007   Throat dilation  2010, 0ct 2011   Sep 2010, Nov 2010, 2014   TONSILLECTOMY  1971   Trigger fingers  2008   WISDOM TOOTH EXTRACTION  1977   Social History   Occupational History   Not on file  Tobacco Use   Smoking status: Never   Smokeless tobacco: Never  Vaping Use   Vaping Use: Never used  Substance and Sexual Activity   Alcohol use: No    Alcohol/week: 0.0 standard drinks   Drug use: Yes   Sexual activity: Not on file

## 2021-08-14 ENCOUNTER — Ambulatory Visit: Payer: Managed Care, Other (non HMO) | Admitting: Physician Assistant

## 2021-08-17 ENCOUNTER — Ambulatory Visit (INDEPENDENT_AMBULATORY_CARE_PROVIDER_SITE_OTHER): Payer: Managed Care, Other (non HMO) | Admitting: Physical Therapy

## 2021-08-17 ENCOUNTER — Encounter: Payer: Self-pay | Admitting: Physical Therapy

## 2021-08-17 ENCOUNTER — Other Ambulatory Visit: Payer: Self-pay

## 2021-08-17 DIAGNOSIS — R293 Abnormal posture: Secondary | ICD-10-CM

## 2021-08-17 DIAGNOSIS — R2681 Unsteadiness on feet: Secondary | ICD-10-CM

## 2021-08-17 DIAGNOSIS — M6281 Muscle weakness (generalized): Secondary | ICD-10-CM | POA: Diagnosis not present

## 2021-08-17 DIAGNOSIS — M21372 Foot drop, left foot: Secondary | ICD-10-CM

## 2021-08-17 DIAGNOSIS — R2689 Other abnormalities of gait and mobility: Secondary | ICD-10-CM

## 2021-08-17 DIAGNOSIS — M6249 Contracture of muscle, multiple sites: Secondary | ICD-10-CM

## 2021-08-17 NOTE — Therapy (Signed)
[facility] [address] [place], [place], [address] Phone: [phone]   Fax:  [phone]  Physical Therapy Treatment  Patient Details  Name: [name] MRN: [id number] Date of Birth: 1961-08-17 Referring Provider (PT): Bevely Palmer Persons, [place]   Encounter Date: 08/17/2021   PT End of Session - 08/17/21 1740     Visit Number 4    Number of Visits 25    Date for PT Re-Evaluation 10/15/21    Authorization Type [employer] Managed    Authorization Time Period $50 COPAY, OOP $4000 $2719.68 MET, 60 DAYS PER CALENDER YEAR, PT/OT/ CHIRO, AFTER OOP MET    PT Start Time 1600    PT Stop Time 7703    PT Time Calculation (min) 47 min    Activity Tolerance Patient tolerated treatment well;Patient limited by fatigue;Patient limited by pain    Behavior During Therapy [facility] for tasks assessed/performed             Past Medical History:  Diagnosis Date   Adhesive capsulitis 2013   Bilateral shoulders   Adhesive capsulitis of both shoulders 2013   Anemia    Cataract    Chronic kidney disease    Depression    Diabetes mellitus without complication ([place])    Diabetic retinopathy ([place]) 2010   Diabetic retinopathy ([place]) 2010   Left eye, mild   Essential tremor 2011   Bilateral hands   Foot drop, bilateral 2007   Foot fracture, left    Gastroparesis 2009   GERD (gastroesophageal reflux disease)    Headache    Hemorrhage 2011   Laser surgery x2   High potassium 2012   Normal as of 2014   Hypercholesterolemia 2010   Hyperlipidemia    Neuromuscular disorder ([place])    patient states that she has a body twitch.   Pneumonia    Postnasal drip 2010   Scratched cornea 2013   Left eye   Sleep apnea    does not wear CPAP   Stage 3 chronic kidney disease (HCC)    Thyroid disease    TMJ (temporomandibular joint disorder)    Type 1 diabetes ([place])     Past Surgical History:  Procedure Laterality Date   ABDOMINAL HYSTERECTOMY  2007   AMPUTATION Right  05/08/2021   Procedure: RIGHT BELOW KNEE AMPUTATION;  Surgeon: Newt Minion, MD;  Location: [facility];  Service: Orthopedics;  Laterality: Right;   APPENDECTOMY     BALLOON DILATION N/A 01/24/2017   Procedure: BALLOON DILATION;  Surgeon: Wilford Corner, MD;  Location: [facility];  Service: Endoscopy;  Laterality: N/A;   CARPAL TUNNEL RELEASE Right 1990   CATARACT EXTRACTION Bilateral 2008   COLONOSCOPY  2007   COLONOSCOPY WITH PROPOFOL N/A 01/24/2017   Procedure: COLONOSCOPY WITH PROPOFOL;  Surgeon: Wilford Corner, MD;  Location: [facility];  Service: Endoscopy;  Laterality: N/A;   ESOPHAGOGASTRODUODENOSCOPY (EGD) WITH PROPOFOL N/A 01/24/2017   Procedure: ESOPHAGOGASTRODUODENOSCOPY (EGD) WITH PROPOFOL;  Surgeon: Wilford Corner, MD;  Location: [facility];  Service: Endoscopy;  Laterality: N/A;  moved oer MD request   EYE SURGERY Left 2012   Pars plana viterctomy, membrane peel, endolaser   ROOT CANAL  2007   ROOT CANAL  2010   Thorat dilation  2007   Throat dilation  2007   Throat dilation  2010, 0ct 2011   Sep 2010, Nov 2010, 2014   TONSILLECTOMY  1971   Trigger fingers  2008   [place] EXTRACTION  [phone]  There were no vitals filed for this visit.   Subjective Assessment - 08/17/21 1600     Subjective She is wearing liner up to 4hrs 2x/day with prosthesis 1-2 hours 2x/day.  She lays in bed except walking or toileting during day.    Patient is accompained by: Family member   husband, Pammy Vesey   Pertinent History Right TTA, CKD, DM1, retinopathy, bil. foot drop, HA    Limitations Standing;Walking;House hold activities    Patient Stated Goals To use prosthesis to be independent in home.    Currently in Pain? No/denies    Pain Onset More than a month ago                               [facility] Adult PT Treatment/Exercise - 08/17/21 1600       Transfers   Transfers Sit to Stand;Stand to [employer] Pivot Transfers    Sit to  Stand 5: Supervision;With upper extremity assist;With armrests;From chair/3-in-1;Other (comment)   to RW or locked rollator walker for stabilization   Sit to Stand Details Visual cues for safe use of DME/AE;Visual cues/gestures for sequencing;Verbal cues for technique    Stand to Sit 5: Supervision;With upper extremity assist;With armrests;To chair/3-in-1;To elevated surface;Other (comment)   from RW or locked rollator walker for stabilization   Stand to Sit Details (indicate cue type and reason) Visual cues for safe use of DME/AE;Visual cues/gestures for sequencing;Verbal cues for technique    Stand Pivot Transfers 5: Supervision   turning 180* sit/stand rollator seat, stabilized along wall   Stand Pivot Transfer Details (indicate cue type and reason) verbal cues on technique      Ambulation/Gait   Ambulation/Gait Yes    Ambulation/Gait Assistance 4: Min guard;5: Supervision    Ambulation Distance (Feet) 120 Feet    Assistive device Rollator;Prosthesis;Other (Comment)   AFO   Ambulation Surface Level;Indoor      Therapeutic Activites    Therapeutic Activities ADL's    ADL's sitting on 24" bar stool with feet on floor without UE support for 3 min.  Discussed working on core endurance / balance without UE support at home. Pt & husband verbalized understanding.      Exercises   Exercises Knee/Hip      Knee/Hip Exercises: Standing   Forward Step Up Left;Hand Hold: 2;Step Height: 4"   2 reps   Forward Step Up Limitations demo, verbal & tactile cues on technique    Step Down Left;Hand Hold: 2;Step Height: 4"   2 reps   Step Down Limitations demo, verbal & tactile cues on technique    Other Standing Knee Exercises turning 90* with right rail & Lt HHA to simulate turning to sit on stair lift at home.      Prosthetics   Prosthetic Care Comments  --    Current prosthetic wear tolerance (days/week)  daily    Current prosthetic wear tolerance (#hours/day)  liner most of awake hours & prosthesis  2-3 hours 2-3 times / day    Current prosthetic weight-bearing tolerance (hours/day)  Pt tolerated standing with RW support & gait with RW for 3 minutes with no increase in limb pain. limb pain was 3-4/10 prior to standing.    Edema pitting    Education Provided Proper wear schedule/adjustment;Other (comment)   see prosthetic care comments                Balance Exercises - 08/17/21  1600       Balance Exercises: Standing   Standing Eyes Opened Wide (BOA);Solid surface;5 reps;Head turns   head turns 4 directions   Standing Eyes Opened Limitations minA / tactile cues for balance & stabilization                  PT Short Term Goals - 08/17/21 1740       PT SHORT TERM GOAL #1   Title Patient's family demonstrate proper donning & verbalize proper cleaning of prosthesis.    Time 4    Period Weeks    Status Achieved    Target Date 08/20/21      PT SHORT TERM GOAL #2   Title Patient tolerates prosthesis or prosthetic liner if in bed wear >8hrs total per day without increase in skin issues.    Time 4    Period Weeks    Status Achieved    Target Date 08/20/21      PT SHORT TERM GOAL #3   Title Patient sit to/from stand w/c to rollator walker with supervision.    Time 3    Period Weeks    Status Achieved    Target Date 08/20/21      PT SHORT TERM GOAL #4   Title Patient ambulates 47' with rollator walker, TTA prosthesis & AFO with min guard.    Time 3    Period Weeks    Status Achieved    Target Date 08/20/21               PT Long Term Goals - 07/28/21 1903       PT LONG TERM GOAL #1   Title Patient & family verbalize & demonstrate proper prosthetic care to enable safe utilization of prosthesis.    Time 12    Period Weeks    Status On-going    Target Date 10/15/21      PT LONG TERM GOAL #2   Title Patient tolerates prosthesis wear >/= 12 hours /day (if in bed during day time, then tolerates liner wear for quick prosthetic wear for transfers)  without skin or limb pain issues.    Time 12    Period Weeks    Status On-going    Target Date 10/15/21      PT LONG TERM GOAL #3   Title scooting or squat pivot transfers w/c to mat or another chair with prosthesis with supervision.    Time 12    Period Weeks    Status On-going    Target Date 10/15/21      PT LONG TERM GOAL #4   Title Standing balance with rollator walker, TTA prosthesis & AFO, reaches 10" anteriorly, scans environment & manages clothes for toileting modified independent.    Time 11    Period Weeks    Status New    Target Date 10/15/21      PT LONG TERM GOAL #5   Title Patient ambulates 71' with rollator walker, TTA prosthesis & AFO modified independent for mobility in home.    Time 11    Period Weeks    Status New    Target Date 10/15/21                   Plan - 08/17/21 1741     Clinical Impression Statement Patient met STGs for first 30 day period.  She is slowly improving balance & gait with rollator walker.  PT is encouraging more out of bed  time but patient is uncertain if she can toilet in time if she has to walk from kitchen or living room.    Personal Factors and Comorbidities Comorbidity 3+;Fitness;Past/Current Experience    Comorbidities Right TTA, CKD, DM1, retinopathy, neuropathy, bil. foot drop, HA    Examination-Activity Limitations Bed Mobility;Locomotion Level;Stand;Toileting;Transfers    Stability/Clinical Decision Making Evolving/Moderate complexity    Rehab Potential Good    PT Frequency 2x / week    PT Duration 12 weeks    PT Treatment/Interventions ADLs/Self Care Home Management;DME Instruction;Gait training;Stair training;Functional mobility training;Therapeutic activities;Therapeutic exercise;Balance training;Neuromuscular re-education;Patient/family education;Prosthetic Training;Vestibular    PT Next Visit Plan work on  standing balance & prosthetic gait with rollator walker including sit to/from stand from seat, review  prosthetic care & progress wear    Consulted and Agree with Plan of Care Patient;Family member/caregiver    Family Member Consulted husband             Patient will benefit from skilled therapeutic intervention in order to improve the following deficits and impairments:  Abnormal gait, Decreased activity tolerance, Decreased balance, Decreased coordination, Decreased endurance, Decreased knowledge of use of DME, Decreased mobility, Decreased range of motion, Decreased skin integrity, Decreased strength, Difficulty walking, Increased edema, Impaired flexibility, Postural dysfunction, Prosthetic Dependency  Visit Diagnosis: Muscle weakness (generalized)  Other abnormalities of gait and mobility  Unsteadiness on feet  Abnormal posture  Contracture of muscle, multiple sites  Foot drop, left     Problem List Patient Active Problem List   Diagnosis Date Noted   Foot pain, right 05/08/2021   Charcot's joint, right ankle and foot    Allergic rhinitis 03/17/2021   Anxiety 03/17/2021   Diabetic polyneuropathy ([place]) 03/17/2021   External hemorrhoids 03/17/2021   History of anemia 03/17/2021   Hyperglycemia due to type 1 diabetes mellitus ([place]) 03/17/2021   Long term (current) use of insulin ([place]) 03/17/2021   Personal history of other diseases of the respiratory system 03/17/2021   Recurrent major depression ([place]) 03/17/2021   Proliferative diabetic retinopathy of right eye without macular edema associated with type 1 diabetes mellitus ([place]) 12/25/2020   Stable treated proliferative diabetic retinopathy of left eye with macular edema determined by examination associated with type 1 diabetes mellitus ([place]) 12/23/2020   Vitreous hemorrhage, right eye ([place]) 12/23/2020   Cellulitis 06/11/2020   Diabetic foot infection ([place]) 06/10/2020   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 03/05/2019   Special screening for malignant neoplasms, colon 01/24/2017    Difficulty in swallowing 01/24/2017   Esophageal reflux 01/24/2017   DM (diabetes mellitus) type 1 with ketoacidosis ([place]) 03/01/2015   Diabetes mellitus type 1 with complications ([place]) 09/28/5944   Diabetic neuropathy ([place]) 03/01/2015   Hypothyroidism 03/01/2015   Depression 03/01/2015   Orthostatic hypotension 03/01/2015   Essential tremor 03/01/2015   Diabetic retinopathy ([place]) 03/01/2015   Gastroparesis due to DM ([place]) 03/01/2015   Chronic renal insufficiency 03/01/2015   Foot drop, bilateral 03/01/2015   HLD (hyperlipidemia) 03/01/2015   Obstructive sleep apnea 03/01/2015    Jamey Reas, PT, DPT 08/17/2021, 5:43 PM  [facility] [address] [place], [place], [address] Phone: [phone]   Fax:  [phone]  Name: [name] MRN: [id number] Date of Birth: [birth year]

## 2021-08-17 NOTE — Telephone Encounter (Signed)
I can write an order for this but the only option that I can find in parach[employer] is the rolling walker with seat and hand brakes that I ordered. I am not sure if they have another product that is not on the site I can leave the rx at the desk for pick up whenever they have time.

## 2021-08-24 ENCOUNTER — Other Ambulatory Visit: Payer: Self-pay

## 2021-08-24 ENCOUNTER — Encounter: Payer: Self-pay | Admitting: Physical Therapy

## 2021-08-24 ENCOUNTER — Ambulatory Visit (INDEPENDENT_AMBULATORY_CARE_PROVIDER_SITE_OTHER): Payer: Managed Care, Other (non HMO) | Admitting: Physical Therapy

## 2021-08-24 DIAGNOSIS — R2689 Other abnormalities of gait and mobility: Secondary | ICD-10-CM | POA: Diagnosis not present

## 2021-08-24 DIAGNOSIS — R293 Abnormal posture: Secondary | ICD-10-CM | POA: Diagnosis not present

## 2021-08-24 DIAGNOSIS — M21372 Foot drop, left foot: Secondary | ICD-10-CM

## 2021-08-24 DIAGNOSIS — R2681 Unsteadiness on feet: Secondary | ICD-10-CM | POA: Diagnosis not present

## 2021-08-24 DIAGNOSIS — M6281 Muscle weakness (generalized): Secondary | ICD-10-CM

## 2021-08-24 NOTE — Therapy (Signed)
[facility] [address] [place], [place], [address] Phone: [phone]   Fax:  [phone]  Physical Therapy Treatment  Patient Details  Name: [name] MRN: [id number] Date of Birth: 18-Jun-1961 Referring Provider (PT): Bevely Palmer Persons, [place]   Encounter Date: 08/24/2021   PT End of Session - 08/24/21 1707     Visit Number 5    Number of Visits 25    Date for PT Re-Evaluation 10/15/21    Authorization Type [employer] Managed    Authorization Time Period $50 COPAY, OOP $4000 $2719.68 MET, 60 DAYS PER CALENDER YEAR, PT/OT/ CHIRO, AFTER OOP MET    PT Start Time 1515    PT Stop Time 1604    PT Time Calculation (min) 49 min    Activity Tolerance Patient tolerated treatment well;Patient limited by fatigue;Patient limited by pain    Behavior During Therapy [facility] for tasks assessed/performed             Past Medical History:  Diagnosis Date   Adhesive capsulitis 2013   Bilateral shoulders   Adhesive capsulitis of both shoulders 2013   Anemia    Cataract    Chronic kidney disease    Depression    Diabetes mellitus without complication ([place])    Diabetic retinopathy ([place]) 2010   Diabetic retinopathy ([place]) 2010   Left eye, mild   Essential tremor 2011   Bilateral hands   Foot drop, bilateral 2007   Foot fracture, left    Gastroparesis 2009   GERD (gastroesophageal reflux disease)    Headache    Hemorrhage 2011   Laser surgery x2   High potassium 2012   Normal as of 2014   Hypercholesterolemia 2010   Hyperlipidemia    Neuromuscular disorder ([place])    patient states that she has a body twitch.   Pneumonia    Postnasal drip 2010   Scratched cornea 2013   Left eye   Sleep apnea    does not wear CPAP   Stage 3 chronic kidney disease (HCC)    Thyroid disease    TMJ (temporomandibular joint disorder)    Type 1 diabetes ([place])     Past Surgical History:  Procedure Laterality Date   ABDOMINAL HYSTERECTOMY  2007   AMPUTATION Right  05/08/2021   Procedure: RIGHT BELOW KNEE AMPUTATION;  Surgeon: Newt Minion, MD;  Location: [place];  Service: Orthopedics;  Laterality: Right;   APPENDECTOMY     BALLOON DILATION N/A 01/24/2017   Procedure: BALLOON DILATION;  Surgeon: Wilford Corner, MD;  Location: [facility];  Service: Endoscopy;  Laterality: N/A;   CARPAL TUNNEL RELEASE Right 1990   CATARACT EXTRACTION Bilateral 2008   COLONOSCOPY  2007   COLONOSCOPY WITH PROPOFOL N/A 01/24/2017   Procedure: COLONOSCOPY WITH PROPOFOL;  Surgeon: Wilford Corner, MD;  Location: [facility];  Service: Endoscopy;  Laterality: N/A;   ESOPHAGOGASTRODUODENOSCOPY (EGD) WITH PROPOFOL N/A 01/24/2017   Procedure: ESOPHAGOGASTRODUODENOSCOPY (EGD) WITH PROPOFOL;  Surgeon: Wilford Corner, MD;  Location: [facility];  Service: Endoscopy;  Laterality: N/A;  moved oer MD request   EYE SURGERY Left 2012   Pars plana viterctomy, membrane peel, endolaser   ROOT CANAL  2007   ROOT CANAL  2010   Thorat dilation  2007   Throat dilation  2007   Throat dilation  2010, 0ct 2011   Sep 2010, Nov 2010, 2014   TONSILLECTOMY  1971   Trigger fingers  2008   [place] EXTRACTION  [phone]  There were no vitals filed for this visit.   Subjective Assessment - 08/24/21 1515     Subjective The side step from garage to platform at top of ramp is 7.25" and step that they place in front of stair lift to get onto lift is 6".  They were able to exchange the rollator walker for one with proper brakes.    Patient is accompained by: Family member   husband, Sherria Riemann   Pertinent History Right TTA, CKD, DM1, retinopathy, bil. foot drop, HA    Limitations Standing;Walking;House hold activities    Patient Stated Goals To use prosthesis to be independent in home.    Currently in Pain? Yes    Pain Score 3     Pain Location Knee    Pain Orientation Left    Pain Descriptors / Indicators Aching    Pain Type Chronic pain    Pain Onset More than a month ago    Pain  Frequency Intermittent    Aggravating Factors  transferring without prosthesis    Pain Relieving Factors tylenol                               [facility] Adult PT Treatment/Exercise - 08/24/21 1530       Transfers   Transfers Sit to Stand;Stand to Sit;Stand Pivot Transfers;Squat Pivot Transfers    Sit to Stand 5: Supervision;With upper extremity assist;With armrests;From chair/3-in-1;Other (comment)   to RW or locked rollator walker for stabilization   Sit to Stand Details Visual cues for safe use of DME/AE;Visual cues/gestures for sequencing;Verbal cues for technique    Stand to Sit 5: Supervision;With upper extremity assist;With armrests;To chair/3-in-1;To elevated surface;Other (comment)   from RW or locked rollator walker for stabilization   Stand to Sit Details (indicate cue type and reason) Visual cues for safe use of DME/AE;Visual cues/gestures for sequencing;Verbal cues for technique    Stand Pivot Transfers --      Ambulation/Gait   Ambulation/Gait Yes    Ambulation/Gait Assistance 5: Supervision    Ambulation Distance (Feet) 100 Feet    Assistive device Rollator;Prosthesis;Other (Comment)   AFO   Ambulation Surface Level;Indoor    Curb 4: Min assist   locked rollator walker, TTA prosthesis & AFO   Curb Details (indicate cue type and reason) PT demo & verbal cues on technique including assisting.  PT performed with pt 1st rep then husband 2nd rep with PT supervising.    Gait Comments Stepping up on 6" block with locked rollator walker turning 90* to simulate step up to sit on stair lift chair. And step off without block (feet ~12" above ground when seated) then turning 90* to grip rollator walker.  All with minA.      Therapeutic Activites    Therapeutic Activities ADL's    ADL's sitting on 24" bar stool with feet on floor without UE support for 3 min.  reviewed Discussion working on core endurance / balance without UE support at home SLOWLY increasing tolerance.   Pt & husband verbalized understanding.      Exercises   Exercises Knee/Hip      Knee/Hip Exercises: Standing   Forward Step Up --    Forward Step Up Limitations --    Step Down --    Step Down Limitations --    Other Standing Knee Exercises --      Prosthetics   Prosthetic Care Comments  goal is  to progress wear of liner to ~8am to 8pm and wear prosthesis for transfers & out of bed. Slowly progressing out of bed time with family.    Current prosthetic wear tolerance (days/week)  daily    Current prosthetic wear tolerance (#hours/day)  liner most of awake hours & prosthesis 2-3 hours 2-3 times / day    Current prosthetic weight-bearing tolerance (hours/day)  Pt tolerated standing with RW support & gait with RW for 3 minutes with no increase in limb pain. limb pain was 3-4/10 prior to standing.    Edema pitting    Education Provided Proper wear schedule/adjustment;Other (comment)   see prosthetic care comments   Person(s) Educated Patient;Spouse    Education Method Explanation;Verbal cues    Education Method Verbalized understanding;Verbal cues required                       PT Short Term Goals - 08/24/21 1708       PT SHORT TERM GOAL #1   Title Patient verbalizes understanding of recommended wear pattern & adjusting ply socks.    Time 4    Period Weeks    Status Revised    Target Date 09/17/21      PT SHORT TERM GOAL #2   Title Patient tolerates prosthetic liner  >10hrs total per day and prosthesis total of 4hrs without increase in skin issues.    Time 4    Period Weeks    Status Revised    Target Date 09/17/21      PT SHORT TERM GOAL #3   Title Patient stand pivot transfer to sit on rollator seat with supervision.    Time 4    Period Weeks    Status Revised    Target Date 09/17/21      PT SHORT TERM GOAL #4   Title Patient ambulates 100' with rollator walker, TTA prosthesis & AFO with supervision.    Time 4    Period Weeks    Status Revised    Target  Date 09/17/21      PT SHORT TERM GOAL #5   Title Patient negotiates curb with rollator walker, prosthesis & AFO with minA.    Time 4    Period Weeks    Status New    Target Date 09/17/21               PT Long Term Goals - 07/28/21 1903       PT LONG TERM GOAL #1   Title Patient & family verbalize & demonstrate proper prosthetic care to enable safe utilization of prosthesis.    Time 12    Period Weeks    Status On-going    Target Date 10/15/21      PT LONG TERM GOAL #2   Title Patient tolerates prosthesis wear >/= 12 hours /day (if in bed during day time, then tolerates liner wear for quick prosthetic wear for transfers) without skin or limb pain issues.    Time 12    Period Weeks    Status On-going    Target Date 10/15/21      PT LONG TERM GOAL #3   Title scooting or squat pivot transfers w/c to mat or another chair with prosthesis with supervision.    Time 12    Period Weeks    Status On-going    Target Date 10/15/21      PT LONG TERM GOAL #4   Title Standing balance with rollator  walker, TTA prosthesis & AFO, reaches 10" anteriorly, scans environment & manages clothes for toileting modified independent.    Time 11    Period Weeks    Status New    Target Date 10/15/21      PT LONG TERM GOAL #5   Title Patient ambulates 93' with rollator walker, TTA prosthesis & AFO modified independent for mobility in home.    Time 11    Period Weeks    Status New    Target Date 10/15/21                   Plan - 08/24/21 1711     Clinical Impression Statement PT instructed in negotiating single step / curb and simulated situation to her stair lift chair to enable her to access her home with husband via ambulation instead of w/c. She did well for first attempt and has potential to meet this goal.  She is tolerating slow progress of activity level and function with prosthesis.    Personal Factors and Comorbidities Comorbidity 3+;Fitness;Past/Current Experience     Comorbidities Right TTA, CKD, DM1, retinopathy, neuropathy, bil. foot drop, HA    Examination-Activity Limitations Bed Mobility;Locomotion Level;Stand;Toileting;Transfers    Stability/Clinical Decision Making Evolving/Moderate complexity    Rehab Potential Good    PT Frequency 2x / week    PT Duration 12 weeks    PT Treatment/Interventions ADLs/Self Care Home Management;DME Instruction;Gait training;Stair training;Functional mobility training;Therapeutic activities;Therapeutic exercise;Balance training;Neuromuscular re-education;Patient/family education;Prosthetic Training;Vestibular    PT Next Visit Plan work on  standing balance & prosthetic gait with rollator walker including sit to/from stand from seat, review prosthetic care & progress wear    Consulted and Agree with Plan of Care Patient;Family member/caregiver    Family Member Consulted husband             Patient will benefit from skilled therapeutic intervention in order to improve the following deficits and impairments:  Abnormal gait, Decreased activity tolerance, Decreased balance, Decreased coordination, Decreased endurance, Decreased knowledge of use of DME, Decreased mobility, Decreased range of motion, Decreased skin integrity, Decreased strength, Difficulty walking, Increased edema, Impaired flexibility, Postural dysfunction, Prosthetic Dependency  Visit Diagnosis: Muscle weakness (generalized)  Other abnormalities of gait and mobility  Unsteadiness on feet  Abnormal posture  Foot drop, left     Problem List Patient Active Problem List   Diagnosis Date Noted   Foot pain, right 05/08/2021   Charcot's joint, right ankle and foot    Allergic rhinitis 03/17/2021   Anxiety 03/17/2021   Diabetic polyneuropathy ([place]) 03/17/2021   External hemorrhoids 03/17/2021   History of anemia 03/17/2021   Hyperglycemia due to type 1 diabetes mellitus ([place]) 03/17/2021   Long term (current) use of insulin ([place]) 03/17/2021    Personal history of other diseases of the respiratory system 03/17/2021   Recurrent major depression ([place]) 03/17/2021   Proliferative diabetic retinopathy of right eye without macular edema associated with type 1 diabetes mellitus ([place]) 12/25/2020   Stable treated proliferative diabetic retinopathy of left eye with macular edema determined by examination associated with type 1 diabetes mellitus ([place]) 12/23/2020   Vitreous hemorrhage, right eye ([place]) 12/23/2020   Cellulitis 06/11/2020   Diabetic foot infection ([place]) 06/10/2020   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 03/05/2019   Special screening for malignant neoplasms, colon 01/24/2017   Difficulty in swallowing 01/24/2017   Esophageal reflux 01/24/2017   DM (diabetes mellitus) type 1 with ketoacidosis ([place]) 03/01/2015   Diabetes mellitus type  1 with complications ([place]) 58/07/9832   Diabetic neuropathy ([place]) 03/01/2015   Hypothyroidism 03/01/2015   Depression 03/01/2015   Orthostatic hypotension 03/01/2015   Essential tremor 03/01/2015   Diabetic retinopathy ([place]) 03/01/2015   Gastroparesis due to DM ([place]) 03/01/2015   Chronic renal insufficiency 03/01/2015   Foot drop, bilateral 03/01/2015   HLD (hyperlipidemia) 03/01/2015   Obstructive sleep apnea 03/01/2015    Jamey Reas, PT, DPT 08/24/2021, 5:14 PM  [facility] [address] [place], [place], [address] Phone: [phone]   Fax:  [phone]  Name: [name] MRN: [id number] Date of Birth: 10/01/1961

## 2021-08-31 ENCOUNTER — Other Ambulatory Visit: Payer: Self-pay

## 2021-08-31 ENCOUNTER — Ambulatory Visit (INDEPENDENT_AMBULATORY_CARE_PROVIDER_SITE_OTHER): Payer: Managed Care, Other (non HMO) | Admitting: Physical Therapy

## 2021-08-31 ENCOUNTER — Encounter: Payer: Self-pay | Admitting: Physical Therapy

## 2021-08-31 DIAGNOSIS — M6281 Muscle weakness (generalized): Secondary | ICD-10-CM

## 2021-08-31 DIAGNOSIS — R2681 Unsteadiness on feet: Secondary | ICD-10-CM

## 2021-08-31 DIAGNOSIS — M21372 Foot drop, left foot: Secondary | ICD-10-CM

## 2021-08-31 DIAGNOSIS — R293 Abnormal posture: Secondary | ICD-10-CM | POA: Diagnosis not present

## 2021-08-31 DIAGNOSIS — R2689 Other abnormalities of gait and mobility: Secondary | ICD-10-CM | POA: Diagnosis not present

## 2021-08-31 DIAGNOSIS — M6249 Contracture of muscle, multiple sites: Secondary | ICD-10-CM

## 2021-08-31 NOTE — Therapy (Signed)
[facility] Physical Therapy [address] [place], [place], [address] Phone: [phone]   Fax:  [phone]  Physical Therapy Treatment  Patient Details  Name: [name] MRN: [id number] Date of Birth: 1961-05-07 Referring Provider (PT): Bevely Palmer Persons, [place]   Encounter Date: 08/31/2021   PT End of Session - 08/31/21 1506     Visit Number 6    Number of Visits 25    Date for PT Re-Evaluation 10/15/21    Authorization Type [employer] Managed    Authorization Time Period $50 COPAY, OOP $4000 $2719.68 MET, 60 DAYS PER CALENDER YEAR, PT/OT/ CHIRO, AFTER OOP MET    PT Start Time 1506    PT Stop Time 1552    PT Time Calculation (min) 46 min    Activity Tolerance Patient tolerated treatment well;Patient limited by fatigue;Patient limited by pain    Behavior During Therapy [facility] for tasks assessed/performed             Past Medical History:  Diagnosis Date   Adhesive capsulitis 2013   Bilateral shoulders   Adhesive capsulitis of both shoulders 2013   Anemia    Cataract    Chronic kidney disease    Depression    Diabetes mellitus without complication ([place])    Diabetic retinopathy ([place]) 2010   Diabetic retinopathy ([place]) 2010   Left eye, mild   Essential tremor 2011   Bilateral hands   Foot drop, bilateral 2007   Foot fracture, left    Gastroparesis 2009   GERD (gastroesophageal reflux disease)    Headache    Hemorrhage 2011   Laser surgery x2   High potassium 2012   Normal as of 2014   Hypercholesterolemia 2010   Hyperlipidemia    Neuromuscular disorder ([place])    patient states that she has a body twitch.   Pneumonia    Postnasal drip 2010   Scratched cornea 2013   Left eye   Sleep apnea    does not wear CPAP   Stage 3 chronic kidney disease (HCC)    Thyroid disease    TMJ (temporomandibular joint disorder)    Type 1 diabetes ([place])     Past Surgical History:  Procedure Laterality Date   ABDOMINAL HYSTERECTOMY  2007   AMPUTATION Right  05/08/2021   Procedure: RIGHT BELOW KNEE AMPUTATION;  Surgeon: Newt Minion, MD;  Location: [place];  Service: Orthopedics;  Laterality: Right;   APPENDECTOMY     BALLOON DILATION N/A 01/24/2017   Procedure: BALLOON DILATION;  Surgeon: Wilford Corner, MD;  Location: [facility];  Service: Endoscopy;  Laterality: N/A;   CARPAL TUNNEL RELEASE Right 1990   CATARACT EXTRACTION Bilateral 2008   COLONOSCOPY  2007   COLONOSCOPY WITH PROPOFOL N/A 01/24/2017   Procedure: COLONOSCOPY WITH PROPOFOL;  Surgeon: Wilford Corner, MD;  Location: [facility];  Service: Endoscopy;  Laterality: N/A;   ESOPHAGOGASTRODUODENOSCOPY (EGD) WITH PROPOFOL N/A 01/24/2017   Procedure: ESOPHAGOGASTRODUODENOSCOPY (EGD) WITH PROPOFOL;  Surgeon: Wilford Corner, MD;  Location: [facility];  Service: Endoscopy;  Laterality: N/A;  moved oer MD request   EYE SURGERY Left 2012   Pars plana viterctomy, membrane peel, endolaser   ROOT CANAL  2007   ROOT CANAL  2010   Thorat dilation  2007   Throat dilation  2007   Throat dilation  2010, 0ct 2011   Sep 2010, Nov 2010, 2014   TONSILLECTOMY  1971   Trigger fingers  2008   [place] EXTRACTION  [phone]  There were no vitals filed for this visit.   Subjective Assessment - 08/31/21 1506     Subjective Her husband tried to use rollator to simulate bottom of steps to stair lift due to amount of space.  They got bar stool. She has been wearing liner 4hrs 2x/day and prosthesis total ~2 hrs.    Patient is accompained by: Family member   husband, [name]   Pertinent History Right TTA, CKD, DM1, retinopathy, bil. foot drop, HA    Limitations Standing;Walking;House hold activities    Patient Stated Goals To use prosthesis to be independent in home.    Currently in Pain? No/denies    Pain Onset More than a month ago                               [facility] Adult PT Treatment/Exercise - 08/31/21 1510       Transfers   Transfers Sit to Stand;Stand to  [employer] Pivot Transfers    Sit to Stand 5: Supervision;With upper extremity assist;With armrests;From chair/3-in-1;Other (comment)   to RW or locked rollator walker for stabilization   Sit to Stand Details Visual cues for safe use of DME/AE;Visual cues/gestures for sequencing;Verbal cues for technique    Stand to Sit 5: Supervision;With upper extremity assist;With armrests;To chair/3-in-1;To elevated surface;Other (comment)   from RW or locked rollator walker for stabilization   Stand to Sit Details (indicate cue type and reason) Visual cues for safe use of DME/AE;Visual cues/gestures for sequencing;Verbal cues for technique    Stand Pivot Transfers 5: Supervision   turning 180* sit/stand rollator seat   Stand Pivot Transfer Details (indicate cue type and reason) pt able to stabilize locked rollator without contact with wall or PT assist      Ambulation/Gait   Ambulation/Gait Yes    Ambulation/Gait Assistance 5: Supervision    Ambulation/Gait Assistance Details min cues for proximity to rollator for control if need to engage brakes    Ambulation Distance (Feet) 100 Feet   100' & 50'   Assistive device Rollator;Prosthesis;Other (Comment)   AFO   Ambulation Surface Level;Indoor    Curb 4: Min assist   locked rollator walker, TTA prosthesis & AFO   Gait Comments --      Therapeutic Activites    Therapeutic Activities ADL's    ADL's sitting on 24" bar stool with feet on floor without UE support for 3 min.      Exercises   Exercises Knee/Hip      Prosthetics   Prosthetic Care Comments  reviewed goal is to progress wear of liner to ~8am to 8pm and wear prosthesis for transfers & out of bed. Slowly progressing out of bed time with family. She reported that she ate meal at table with family for first time in >year    Current prosthetic wear tolerance (days/week)  daily    Current prosthetic wear tolerance (#hours/day)  liner most of awake hours & prosthesis ~2 hours total  / day    Current prosthetic weight-bearing tolerance (hours/day)  Pt tolerated standing with RW support & gait with RW for 3 minutes with no increase in limb pain. limb pain was 3-4/10 prior to standing.    Edema pitting    Education Provided Proper wear schedule/adjustment;Other (comment)   see prosthetic care comments   Person(s) Educated Patient;Spouse    Education Method Explanation;Verbal cues    Education Method Verbalized understanding  Balance Exercises - 08/31/21 1510       Balance Exercises: Standing   Standing Eyes Opened Wide (BOA);Solid surface;Foam/compliant surface;Head turns;5 reps   head turns 4 directions,  foam with BUE support back of hands on //bars   Standing Eyes Opened Limitations minA / tactile cues for balance & stabilization    Standing Eyes Closed Wide (BOA);Head turns;Solid surface;5 reps   head turns 4 directions   Standing Eyes Closed Limitations minA / tactile cues for balance & stabilization                  PT Short Term Goals - 08/24/21 1708       PT SHORT TERM GOAL #1   Title Patient verbalizes understanding of recommended wear pattern & adjusting ply socks.    Time 4    Period Weeks    Status Revised    Target Date 09/17/21      PT SHORT TERM GOAL #2   Title Patient tolerates prosthetic liner  >10hrs total per day and prosthesis total of 4hrs without increase in skin issues.    Time 4    Period Weeks    Status Revised    Target Date 09/17/21      PT SHORT TERM GOAL #3   Title Patient stand pivot transfer to sit on rollator seat with supervision.    Time 4    Period Weeks    Status Revised    Target Date 09/17/21      PT SHORT TERM GOAL #4   Title Patient ambulates 100' with rollator walker, TTA prosthesis & AFO with supervision.    Time 4    Period Weeks    Status Revised    Target Date 09/17/21      PT SHORT TERM GOAL #5   Title Patient negotiates curb with rollator walker, prosthesis & AFO with  minA.    Time 4    Period Weeks    Status New    Target Date 09/17/21               PT Long Term Goals - 07/28/21 1903       PT LONG TERM GOAL #1   Title Patient & family verbalize & demonstrate proper prosthetic care to enable safe utilization of prosthesis.    Time 12    Period Weeks    Status On-going    Target Date 10/15/21      PT LONG TERM GOAL #2   Title Patient tolerates prosthesis wear >/= 12 hours /day (if in bed during day time, then tolerates liner wear for quick prosthetic wear for transfers) without skin or limb pain issues.    Time 12    Period Weeks    Status On-going    Target Date 10/15/21      PT LONG TERM GOAL #3   Title scooting or squat pivot transfers w/c to mat or another chair with prosthesis with supervision.    Time 12    Period Weeks    Status On-going    Target Date 10/15/21      PT LONG TERM GOAL #4   Title Standing balance with rollator walker, TTA prosthesis & AFO, reaches 10" anteriorly, scans environment & manages clothes for toileting modified independent.    Time 11    Period Weeks    Status New    Target Date 10/15/21      PT LONG TERM GOAL #5   Title Patient ambulates  32' with rollator walker, TTA prosthesis & AFO modified independent for mobility in home.    Time 11    Period Weeks    Status New    Target Date 10/15/21                   Plan - 08/31/21 1506     Clinical Impression Statement Patient is improving her balance & mobility with rollator walker.  She was able to sit with family at dining table to eat for first time in over a year.  PT worked on standing balance activities to facilitate visual, proprioceptive & vestibular input which she improved with repetition & instruction.    Personal Factors and Comorbidities Comorbidity 3+;Fitness;Past/Current Experience    Comorbidities Right TTA, CKD, DM1, retinopathy, neuropathy, bil. foot drop, HA    Examination-Activity Limitations Bed Mobility;Locomotion  Level;Stand;Toileting;Transfers    Stability/Clinical Decision Making Evolving/Moderate complexity    Rehab Potential Good    PT Frequency 2x / week    PT Duration 12 weeks    PT Treatment/Interventions ADLs/Self Care Home Management;DME Instruction;Gait training;Stair training;Functional mobility training;Therapeutic activities;Therapeutic exercise;Balance training;Neuromuscular re-education;Patient/family education;Prosthetic Training;Vestibular    PT Next Visit Plan work on  standing balance to facilitate ankle/residual limb, hip & step strategies, Prosthetic gait with rollator walker including sit to/from stand from seat, review prosthetic care & progress wear as indicated    Consulted and Agree with Plan of Care Patient;Family member/caregiver    Family Member Consulted husband             Patient will benefit from skilled therapeutic intervention in order to improve the following deficits and impairments:  Abnormal gait, Decreased activity tolerance, Decreased balance, Decreased coordination, Decreased endurance, Decreased knowledge of use of DME, Decreased mobility, Decreased range of motion, Decreased skin integrity, Decreased strength, Difficulty walking, Increased edema, Impaired flexibility, Postural dysfunction, Prosthetic Dependency  Visit Diagnosis: Muscle weakness (generalized)  Other abnormalities of gait and mobility  Unsteadiness on feet  Abnormal posture  Foot drop, left  Contracture of muscle, multiple sites     Problem List Patient Active Problem List   Diagnosis Date Noted   Foot pain, right 05/08/2021   Charcot's joint, right ankle and foot    Allergic rhinitis 03/17/2021   Anxiety 03/17/2021   Diabetic polyneuropathy ([place]) 03/17/2021   External hemorrhoids 03/17/2021   History of anemia 03/17/2021   Hyperglycemia due to type 1 diabetes mellitus ([place]) 03/17/2021   Long term (current) use of insulin ([place]) 03/17/2021   Personal history of other  diseases of the respiratory system 03/17/2021   Recurrent major depression ([place]) 03/17/2021   Proliferative diabetic retinopathy of right eye without macular edema associated with type 1 diabetes mellitus ([place]) 12/25/2020   Stable treated proliferative diabetic retinopathy of left eye with macular edema determined by examination associated with type 1 diabetes mellitus ([place]) 12/23/2020   Vitreous hemorrhage, right eye ([place]) 12/23/2020   Cellulitis 06/11/2020   Diabetic foot infection ([place]) 06/10/2020   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 03/05/2019   Special screening for malignant neoplasms, colon 01/24/2017   Difficulty in swallowing 01/24/2017   Esophageal reflux 01/24/2017   DM (diabetes mellitus) type 1 with ketoacidosis ([place]) 03/01/2015   Diabetes mellitus type 1 with complications ([place]) 24/46/2863   Diabetic neuropathy ([place]) 03/01/2015   Hypothyroidism 03/01/2015   Depression 03/01/2015   Orthostatic hypotension 03/01/2015   Essential tremor 03/01/2015   Diabetic retinopathy ([place]) 03/01/2015   Gastroparesis due to DM ([place]) 03/01/2015  Chronic renal insufficiency 03/01/2015   Foot drop, bilateral 03/01/2015   HLD (hyperlipidemia) 03/01/2015   Obstructive sleep apnea 03/01/2015    Jamey Reas, PT, DPT 08/31/2021, 5:01 PM  [facility] Physical Therapy [address] [place], [place], [address] Phone: [phone]   Fax:  [phone]  Name: [name] MRN: [id number] Date of Birth: 01/05/1961

## 2021-09-02 ENCOUNTER — Encounter: Payer: Self-pay | Admitting: Orthopedic Surgery

## 2021-09-07 ENCOUNTER — Ambulatory Visit (INDEPENDENT_AMBULATORY_CARE_PROVIDER_SITE_OTHER): Payer: Managed Care, Other (non HMO) | Admitting: Physical Therapy

## 2021-09-07 ENCOUNTER — Other Ambulatory Visit: Payer: Self-pay

## 2021-09-07 ENCOUNTER — Encounter: Payer: Self-pay | Admitting: Physical Therapy

## 2021-09-07 DIAGNOSIS — M21372 Foot drop, left foot: Secondary | ICD-10-CM

## 2021-09-07 DIAGNOSIS — R293 Abnormal posture: Secondary | ICD-10-CM

## 2021-09-07 DIAGNOSIS — M6281 Muscle weakness (generalized): Secondary | ICD-10-CM | POA: Diagnosis not present

## 2021-09-07 DIAGNOSIS — R2689 Other abnormalities of gait and mobility: Secondary | ICD-10-CM | POA: Diagnosis not present

## 2021-09-07 DIAGNOSIS — R2681 Unsteadiness on feet: Secondary | ICD-10-CM | POA: Diagnosis not present

## 2021-09-07 DIAGNOSIS — M6249 Contracture of muscle, multiple sites: Secondary | ICD-10-CM

## 2021-09-07 NOTE — Therapy (Signed)
[facility] Physical Therapy [address] [place], [place], [address] Phone: [phone]   Fax:  [phone]  Physical Therapy Treatment  Patient Details  Name: [name] MRN: [id number] Date of Birth: [birth year] Referring Provider (PT): Bevely Palmer Persons, [place]   Encounter Date: 09/07/2021   PT End of Session - 09/07/21 1506     Visit Number 7    Number of Visits 25    Date for PT Re-Evaluation 10/15/21    Authorization Type [employer] Managed    Authorization Time Period $50 COPAY, OOP $4000 $2719.68 MET, 60 DAYS PER CALENDER YEAR, PT/OT/ CHIRO, AFTER OOP MET    PT Start Time 1507    PT Stop Time 1600    PT Time Calculation (min) 53 min    Activity Tolerance Patient tolerated treatment well;Patient limited by fatigue;Patient limited by pain    Behavior During Therapy [facility] for tasks assessed/performed             Past Medical History:  Diagnosis Date   Adhesive capsulitis 2013   Bilateral shoulders   Adhesive capsulitis of both shoulders 2013   Anemia    Cataract    Chronic kidney disease    Depression    Diabetes mellitus without complication ([place])    Diabetic retinopathy ([place]) 2010   Diabetic retinopathy ([place]) 2010   Left eye, mild   Essential tremor 2011   Bilateral hands   Foot drop, bilateral 2007   Foot fracture, left    Gastroparesis 2009   GERD (gastroesophageal reflux disease)    Headache    Hemorrhage 2011   Laser surgery x2   High potassium 2012   Normal as of 2014   Hypercholesterolemia 2010   Hyperlipidemia    Neuromuscular disorder ([place])    patient states that she has a body twitch.   Pneumonia    Postnasal drip 2010   Scratched cornea 2013   Left eye   Sleep apnea    does not wear CPAP   Stage 3 chronic kidney disease (HCC)    Thyroid disease    TMJ (temporomandibular joint disorder)    Type 1 diabetes ([place])     Past Surgical History:  Procedure Laterality Date   ABDOMINAL HYSTERECTOMY  2007   AMPUTATION Right  05/08/2021   Procedure: RIGHT BELOW KNEE AMPUTATION;  Surgeon: Newt Minion, MD;  Location: [facility];  Service: Orthopedics;  Laterality: Right;   APPENDECTOMY     BALLOON DILATION N/A 01/24/2017   Procedure: BALLOON DILATION;  Surgeon: Wilford Corner, MD;  Location: [facility];  Service: Endoscopy;  Laterality: N/A;   CARPAL TUNNEL RELEASE Right 1990   CATARACT EXTRACTION Bilateral 2008   COLONOSCOPY  2007   COLONOSCOPY WITH PROPOFOL N/A 01/24/2017   Procedure: COLONOSCOPY WITH PROPOFOL;  Surgeon: Wilford Corner, MD;  Location: [facility];  Service: Endoscopy;  Laterality: N/A;   ESOPHAGOGASTRODUODENOSCOPY (EGD) WITH PROPOFOL N/A 01/24/2017   Procedure: ESOPHAGOGASTRODUODENOSCOPY (EGD) WITH PROPOFOL;  Surgeon: Wilford Corner, MD;  Location: [facility];  Service: Endoscopy;  Laterality: N/A;  moved oer MD request   EYE SURGERY Left 2012   Pars plana viterctomy, membrane peel, endolaser   ROOT CANAL  2007   ROOT CANAL  2010   Thorat dilation  2007   Throat dilation  2007   Throat dilation  2010, 0ct 2011   Sep 2010, Nov 2010, 2014   TONSILLECTOMY  1971   Trigger fingers  2008   [place] EXTRACTION  [phone]  There were no vitals filed for this visit.   Subjective Assessment - 09/07/21 1507     Subjective She has small scab still on great toe where Dr Sharol Given removed nail but no signs of infection.  She is wearing prosthetic liner from arising to bedtime except 2 hours midday. The prosthesis is on limb any time sitting at edge of bed or out of bed.    Patient is accompained by: Family member   husband, Rendi Mapel   Pertinent History Right TTA, CKD, DM1, retinopathy, bil. foot drop, HA    Limitations Standing;Walking;House hold activities    Patient Stated Goals To use prosthesis to be independent in home.    Currently in Pain? No/denies    Pain Onset More than a month ago                               [facility] Adult PT Treatment/Exercise - 09/07/21 1507        Transfers   Transfers Sit to Stand;Stand to [employer] Pivot Transfers    Sit to Stand 5: Supervision;With upper extremity assist;With armrests;From chair/3-in-1;Other (comment)   to RW or locked rollator walker for stabilization   Sit to Stand Details Visual cues for safe use of DME/AE;Visual cues/gestures for sequencing;Verbal cues for technique    Stand to Sit 5: Supervision;With upper extremity assist;With armrests;To chair/3-in-1;To elevated surface;Other (comment)   from RW or locked rollator walker for stabilization   Stand to Sit Details (indicate cue type and reason) Visual cues for safe use of DME/AE;Visual cues/gestures for sequencing;Verbal cues for technique    Stand Pivot Transfers 5: Supervision   turning 180* sit/stand rollator seat     Ambulation/Gait   Ambulation/Gait Yes    Ambulation/Gait Assistance 5: Supervision    Ambulation Distance (Feet) 100 Feet   130'   Assistive device Rollator;Prosthesis;Other (Comment)   AFO   Curb --      Therapeutic Activites    Therapeutic Activities ADL's    ADL's sitting on 24" bar stool with feet on floor without UE support for rests between standing balance activities      Exercises   Exercises Knee/Hip      Knee/Hip Exercises: Standing   Forward Step Up Both;Hand Hold: 2;Step Height: 4"   3 reps   Forward Step Up Limitations demo, verbal & tactile cues on technique    Step Down Both;Hand Hold: 2;Step Height: 4"   3 reps   Step Down Limitations attempted with 6" block initially but RLE buckled requiring maxA to prevent fall.   demo, verbal & tactile cues on technique    Rocker Board 1 minute   BUE support, ant/level/post & right/level/left   Theatre stage manager, demo, verbal & tactile cues for balance reactions.      Prosthetics   Prosthetic Care Comments  reviewed goal is to progress wear of liner from awakening to going to sleep at night and wear prosthesis for sitting at edge of bed,  transfers & out of bed. Slowly progressing out of bed time with family. She reported sitting with family 1x/day now.    Current prosthetic wear tolerance (days/week)  daily    Current prosthetic wear tolerance (#hours/day)  liner most of awake hours & prosthesis ~2 hours total / day    Current prosthetic weight-bearing tolerance (hours/day)  Pt tolerated standing with RW support & gait with RW for 3 minutes with no  increase in limb pain. limb pain was 3-4/10 prior to standing.    Edema pitting    Education Provided Proper wear schedule/adjustment;Other (comment)   see prosthetic care comments   Person(s) Educated Patient;Spouse    Education Method Explanation;Verbal cues    Education Method Verbalized understanding                 Balance Exercises - 09/07/21 1600       Balance Exercises: Standing   Stepping Strategy Anterior;Posterior   3 reps ea LE ea direction   Stepping Strategy Limitations standing on towel roll with light UE support on //bars stepping off with goal to catch balance without UE support.  Pt improved with rep/practice but still required support or assist                  PT Short Term Goals - 08/24/21 1708       PT SHORT TERM GOAL #1   Title Patient verbalizes understanding of recommended wear pattern & adjusting ply socks.    Time 4    Period Weeks    Status Revised    Target Date 09/17/21      PT SHORT TERM GOAL #2   Title Patient tolerates prosthetic liner  >10hrs total per day and prosthesis total of 4hrs without increase in skin issues.    Time 4    Period Weeks    Status Revised    Target Date 09/17/21      PT SHORT TERM GOAL #3   Title Patient stand pivot transfer to sit on rollator seat with supervision.    Time 4    Period Weeks    Status Revised    Target Date 09/17/21      PT SHORT TERM GOAL #4   Title Patient ambulates 100' with rollator walker, TTA prosthesis & AFO with supervision.    Time 4    Period Weeks    Status  Revised    Target Date 09/17/21      PT SHORT TERM GOAL #5   Title Patient negotiates curb with rollator walker, prosthesis & AFO with minA.    Time 4    Period Weeks    Status New    Target Date 09/17/21               PT Long Term Goals - 07/28/21 1903       PT LONG TERM GOAL #1   Title Patient & family verbalize & demonstrate proper prosthetic care to enable safe utilization of prosthesis.    Time 12    Period Weeks    Status On-going    Target Date 10/15/21      PT LONG TERM GOAL #2   Title Patient tolerates prosthesis wear >/= 12 hours /day (if in bed during day time, then tolerates liner wear for quick prosthetic wear for transfers) without skin or limb pain issues.    Time 12    Period Weeks    Status On-going    Target Date 10/15/21      PT LONG TERM GOAL #3   Title scooting or squat pivot transfers w/c to mat or another chair with prosthesis with supervision.    Time 12    Period Weeks    Status On-going    Target Date 10/15/21      PT LONG TERM GOAL #4   Title Standing balance with rollator walker, TTA prosthesis & AFO, reaches 10" anteriorly, scans environment &  manages clothes for toileting modified independent.    Time 11    Period Weeks    Status New    Target Date 10/15/21      PT LONG TERM GOAL #5   Title Patient ambulates 50' with rollator walker, TTA prosthesis & AFO modified independent for mobility in home.    Time 11    Period Weeks    Status New    Target Date 10/15/21                   Plan - 09/07/21 1507     Clinical Impression Statement Patient is tolerating increased wear & reports out of bed with family for activities at least once per day.  PT worked on balance reactions & functional strength with LEs today. She needs more work in PT prior to performing with family at home.    Personal Factors and Comorbidities Comorbidity 3+;Fitness;Past/Current Experience    Comorbidities Right TTA, CKD, DM1, retinopathy, neuropathy,  bil. foot drop, HA    Examination-Activity Limitations Bed Mobility;Locomotion Level;Stand;Toileting;Transfers    Stability/Clinical Decision Making Evolving/Moderate complexity    Rehab Potential Good    PT Frequency 2x / week    PT Duration 12 weeks    PT Treatment/Interventions ADLs/Self Care Home Management;DME Instruction;Gait training;Stair training;Functional mobility training;Therapeutic activities;Therapeutic exercise;Balance training;Neuromuscular re-education;Patient/family education;Prosthetic Training;Vestibular    PT Next Visit Plan continue work on  standing balance to facilitate ankle/residual limb, hip & step strategies, Prosthetic gait with rollator walker including sit to/from stand from seat, review prosthetic care & progress wear as indicated    Consulted and Agree with Plan of Care Patient;Family member/caregiver    Family Member Consulted husband             Patient will benefit from skilled therapeutic intervention in order to improve the following deficits and impairments:  Abnormal gait, Decreased activity tolerance, Decreased balance, Decreased coordination, Decreased endurance, Decreased knowledge of use of DME, Decreased mobility, Decreased range of motion, Decreased skin integrity, Decreased strength, Difficulty walking, Increased edema, Impaired flexibility, Postural dysfunction, Prosthetic Dependency  Visit Diagnosis: Muscle weakness (generalized)  Other abnormalities of gait and mobility  Unsteadiness on feet  Abnormal posture  Foot drop, left  Contracture of muscle, multiple sites     Problem List Patient Active Problem List   Diagnosis Date Noted   Foot pain, right 05/08/2021   Charcot's joint, right ankle and foot    Allergic rhinitis 03/17/2021   Anxiety 03/17/2021   Diabetic polyneuropathy ([place]) 03/17/2021   External hemorrhoids 03/17/2021   History of anemia 03/17/2021   Hyperglycemia due to type 1 diabetes mellitus ([place]) 03/17/2021    Long term (current) use of insulin ([place]) 03/17/2021   Personal history of other diseases of the respiratory system 03/17/2021   Recurrent major depression ([place]) 03/17/2021   Proliferative diabetic retinopathy of right eye without macular edema associated with type 1 diabetes mellitus ([place]) 12/25/2020   Stable treated proliferative diabetic retinopathy of left eye with macular edema determined by examination associated with type 1 diabetes mellitus ([place]) 12/23/2020   Vitreous hemorrhage, right eye ([place]) 12/23/2020   Cellulitis 06/11/2020   Diabetic foot infection ([place]) 06/10/2020   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 03/05/2019   Special screening for malignant neoplasms, colon 01/24/2017   Difficulty in swallowing 01/24/2017   Esophageal reflux 01/24/2017   DM (diabetes mellitus) type 1 with ketoacidosis ([place]) 03/01/2015   Diabetes mellitus type 1 with complications ([place]) 10/93/2355  Diabetic neuropathy ([place]) 03/01/2015   Hypothyroidism 03/01/2015   Depression 03/01/2015   Orthostatic hypotension 03/01/2015   Essential tremor 03/01/2015   Diabetic retinopathy ([place]) 03/01/2015   Gastroparesis due to DM ([place]) 03/01/2015   Chronic renal insufficiency 03/01/2015   Foot drop, bilateral 03/01/2015   HLD (hyperlipidemia) 03/01/2015   Obstructive sleep apnea 03/01/2015    Jamey Reas, PT, DPT 09/07/2021, 5:05 PM  [facility] Physical Therapy [address] [place], [place], [address] Phone: [phone]   Fax:  [phone]  Name: SHANDRIKA AMBERS MRN: [id number] Date of Birth: 10-05-1961

## 2021-09-14 ENCOUNTER — Ambulatory Visit (INDEPENDENT_AMBULATORY_CARE_PROVIDER_SITE_OTHER): Payer: Managed Care, Other (non HMO) | Admitting: Physical Therapy

## 2021-09-14 ENCOUNTER — Encounter: Payer: Self-pay | Admitting: Physical Therapy

## 2021-09-14 ENCOUNTER — Other Ambulatory Visit: Payer: Self-pay

## 2021-09-14 DIAGNOSIS — M6281 Muscle weakness (generalized): Secondary | ICD-10-CM | POA: Diagnosis not present

## 2021-09-14 DIAGNOSIS — R2689 Other abnormalities of gait and mobility: Secondary | ICD-10-CM | POA: Diagnosis not present

## 2021-09-14 DIAGNOSIS — R2681 Unsteadiness on feet: Secondary | ICD-10-CM

## 2021-09-14 DIAGNOSIS — R293 Abnormal posture: Secondary | ICD-10-CM

## 2021-09-14 DIAGNOSIS — M21372 Foot drop, left foot: Secondary | ICD-10-CM

## 2021-09-14 NOTE — Therapy (Signed)
[facility] [address] [place], [place], [address] Phone: [phone]   Fax:  [phone]  Physical Therapy Treatment  Patient Details  Name: [name] MRN: [id number] Date of Birth: Jul 10, 1961 Referring Provider (PT): Bevely Palmer Persons, [place]   Encounter Date: 09/14/2021   PT End of Session - 09/14/21 1521     Visit Number 8    Number of Visits 25    Date for PT Re-Evaluation 10/15/21    Authorization Type [employer] Managed    Authorization Time Period $50 COPAY, OOP $4000 $2719.68 MET, 60 DAYS PER CALENDER YEAR, PT/OT/ CHIRO, AFTER OOP MET    PT Start Time 1515    PT Stop Time 1608    PT Time Calculation (min) 53 min    Equipment Utilized During Treatment Gait belt    Activity Tolerance Patient tolerated treatment well;Patient limited by fatigue;Patient limited by pain    Behavior During Therapy [facility] for tasks assessed/performed             Past Medical History:  Diagnosis Date   Adhesive capsulitis 2013   Bilateral shoulders   Adhesive capsulitis of both shoulders 2013   Anemia    Cataract    Chronic kidney disease    Depression    Diabetes mellitus without complication ([place])    Diabetic retinopathy ([place]) 2010   Diabetic retinopathy ([place]) 2010   Left eye, mild   Essential tremor 2011   Bilateral hands   Foot drop, bilateral 2007   Foot fracture, left    Gastroparesis 2009   GERD (gastroesophageal reflux disease)    Headache    Hemorrhage 2011   Laser surgery x2   High potassium 2012   Normal as of 2014   Hypercholesterolemia 2010   Hyperlipidemia    Neuromuscular disorder ([place])    patient states that she has a body twitch.   Pneumonia    Postnasal drip 2010   Scratched cornea 2013   Left eye   Sleep apnea    does not wear CPAP   Stage 3 chronic kidney disease (HCC)    Thyroid disease    TMJ (temporomandibular joint disorder)    Type 1 diabetes ([place])     Past Surgical History:  Procedure Laterality Date    ABDOMINAL HYSTERECTOMY  2007   AMPUTATION Right 05/08/2021   Procedure: RIGHT BELOW KNEE AMPUTATION;  Surgeon: Newt Minion, MD;  Location: [place];  Service: Orthopedics;  Laterality: Right;   APPENDECTOMY     BALLOON DILATION N/A 01/24/2017   Procedure: BALLOON DILATION;  Surgeon: Wilford Corner, MD;  Location: [facility];  Service: Endoscopy;  Laterality: N/A;   CARPAL TUNNEL RELEASE Right 1990   CATARACT EXTRACTION Bilateral 2008   COLONOSCOPY  2007   COLONOSCOPY WITH PROPOFOL N/A 01/24/2017   Procedure: COLONOSCOPY WITH PROPOFOL;  Surgeon: Wilford Corner, MD;  Location: [facility];  Service: Endoscopy;  Laterality: N/A;   ESOPHAGOGASTRODUODENOSCOPY (EGD) WITH PROPOFOL N/A 01/24/2017   Procedure: ESOPHAGOGASTRODUODENOSCOPY (EGD) WITH PROPOFOL;  Surgeon: Wilford Corner, MD;  Location: [facility];  Service: Endoscopy;  Laterality: N/A;  moved oer MD request   EYE SURGERY Left 2012   Pars plana viterctomy, membrane peel, endolaser   ROOT CANAL  2007   ROOT CANAL  2010   Thorat dilation  2007   Throat dilation  2007   Throat dilation  2010, 0ct 2011   Sep 2010, Nov 2010, 2014   TONSILLECTOMY  1971   Trigger fingers  2008   WISDOM TOOTH EXTRACTION  1977    There were no vitals filed for this visit.   Subjective Assessment - 09/14/21 1515     Subjective She has been wearing liner from first arising until ready for bed at night.  She wears prosthesis when sitting on edge of bed or walking. She has been eating 2 meals / day. She uses prosthesis & rollator walker to get in/out of bathroom for shower using bench.    Patient is accompained by: Family member   husband, Jayani Rozman   Pertinent History Right TTA, CKD, DM1, retinopathy, bil. foot drop, HA    Limitations Standing;Walking;House hold activities    Patient Stated Goals To use prosthesis to be independent in home.    Currently in Pain? No/denies    Pain Onset More than a month ago                                [facility] Adult PT Treatment/Exercise - 09/14/21 1515       Transfers   Transfers Sit to Stand;Stand to [employer] Pivot Transfers    Sit to Stand 5: Supervision;With upper extremity assist;With armrests;From chair/3-in-1;Other (comment)   to RW or locked rollator walker for stabilization   Sit to Stand Details Visual cues for safe use of DME/AE;Visual cues/gestures for sequencing;Verbal cues for technique    Stand to Sit 5: Supervision;With upper extremity assist;With armrests;To chair/3-in-1;To elevated surface;Other (comment)   from RW or locked rollator walker for stabilization   Stand to Sit Details (indicate cue type and reason) Visual cues for safe use of DME/AE;Visual cues/gestures for sequencing;Verbal cues for technique    Stand Pivot Transfers 5: Supervision   turning 180* sit/stand rollator seat     Ambulation/Gait   Ambulation/Gait Yes    Ambulation/Gait Assistance 5: Supervision    Ambulation Distance (Feet) 150 Feet    Assistive device Rollator;Prosthesis;Other (Comment)   AFO   Gait Comments PT verbal cues to pt & husband on slowly increasing frequency over awake hours of short (room to room) walks to build stamina and 1x/day long (her max tolerable distance) walk.  Once these 2 walks don't fatigue her, she can add 2-4 medium (distance bw other two) walks / day.  Pt & husband verbalized understanding.      Therapeutic Activites    Therapeutic Activities ADL's    ADL's sitting on 24" bar stool with feet on floor without UE support for rests between standing balance activities      Exercises   Exercises Knee/Hip      Knee/Hip Exercises: Standing   Forward Step Up Both;Hand Hold: 2;Step Height: 4"   4 reps   Forward Step Up Limitations demo, verbal & tactile cues on technique    Step Down Both;Hand Hold: 2;Step Height: 4"   4 reps   Step Down Limitations attempted with 6" block initially but RLE buckled requiring maxA  to prevent fall.   demo, verbal & tactile cues on technique    Rocker Board 1 minute   BUE support, ant/level/post & right/level/left   Theatre stage manager, demo, verbal & tactile cues for balance reactions.      Prosthetics   Prosthetic Care Comments  toe nail on left great toe appears healed. small redness at PIP may be due to seem of sock. PT recommended socks inside out so seem not directly against skin or switch to  diabetic socks that don't have  seem.  pt & husband verbalized understanding.    Current prosthetic wear tolerance (days/week)  daily    Current prosthetic wear tolerance (#hours/day)  liner most of awake hours & prosthesis ~2 hours total / day    Current prosthetic weight-bearing tolerance (hours/day)  Pt tolerated standing with RW support & gait with RW for 3 minutes with no increase in limb pain. limb pain was 3-4/10 prior to standing.    Edema pitting    Education Provided Proper wear schedule/adjustment;Other (comment)   see prosthetic care comments                      PT Short Term Goals - 09/14/21 1618       PT SHORT TERM GOAL #1   Title Patient verbalizes understanding of recommended wear pattern & adjusting ply socks.    Time 4    Period Weeks    Status Achieved    Target Date 09/17/21      PT SHORT TERM GOAL #2   Title Patient tolerates prosthetic liner  >10hrs total per day and prosthesis total of 4hrs without increase in skin issues.    Time 4    Period Weeks    Status Achieved    Target Date 09/17/21      PT SHORT TERM GOAL #3   Title Patient stand pivot transfer to sit on rollator seat with supervision.    Time 4    Period Weeks    Status Achieved    Target Date 09/17/21      PT SHORT TERM GOAL #4   Title Patient ambulates 100' with rollator walker, TTA prosthesis & AFO with supervision.    Time 4    Period Weeks    Status Achieved    Target Date 09/17/21      PT SHORT TERM GOAL #5   Title Patient negotiates curb  with rollator walker, prosthesis & AFO with minA.    Baseline met 10/3    Time 4    Period Weeks    Status Achieved    Target Date 09/17/21               PT Long Term Goals - 07/28/21 1903       PT LONG TERM GOAL #1   Title Patient & family verbalize & demonstrate proper prosthetic care to enable safe utilization of prosthesis.    Time 12    Period Weeks    Status On-going    Target Date 10/15/21      PT LONG TERM GOAL #2   Title Patient tolerates prosthesis wear >/= 12 hours /day (if in bed during day time, then tolerates liner wear for quick prosthetic wear for transfers) without skin or limb pain issues.    Time 12    Period Weeks    Status On-going    Target Date 10/15/21      PT LONG TERM GOAL #3   Title scooting or squat pivot transfers w/c to mat or another chair with prosthesis with supervision.    Time 12    Period Weeks    Status On-going    Target Date 10/15/21      PT LONG TERM GOAL #4   Title Standing balance with rollator walker, TTA prosthesis & AFO, reaches 10" anteriorly, scans environment & manages clothes for toileting modified independent.    Time 11    Period Weeks  Status New    Target Date 10/15/21      PT LONG TERM GOAL #5   Title Patient ambulates 82' with rollator walker, TTA prosthesis & AFO modified independent for mobility in home.    Time 11    Period Weeks    Status New    Target Date 10/15/21                   Plan - 09/14/21 1522     Clinical Impression Statement Pt met all STGs set for this 30-day period.  She is on target to meet LTGs.  PT instructed in walking program to improve stamina & endurance which she appears to understand.  Pt's balance has improved. She was able to remove long sleeve over shirt standing without assistance / supervision.    Personal Factors and Comorbidities Comorbidity 3+;Fitness;Past/Current Experience    Comorbidities Right TTA, CKD, DM1, retinopathy, neuropathy, bil. foot drop, HA     Examination-Activity Limitations Bed Mobility;Locomotion Level;Stand;Toileting;Transfers    Stability/Clinical Decision Making Evolving/Moderate complexity    Rehab Potential Good    PT Frequency 2x / week    PT Duration 12 weeks    PT Treatment/Interventions ADLs/Self Care Home Management;DME Instruction;Gait training;Stair training;Functional mobility training;Therapeutic activities;Therapeutic exercise;Balance training;Neuromuscular re-education;Patient/family education;Prosthetic Training;Vestibular    PT Next Visit Plan work towards [place], continue work on  standing balance to facilitate ankle/residual limb, hip & step strategies, Prosthetic gait with rollator walker including sit to/from stand from seat, review prosthetic care & progress wear as indicated    Consulted and Agree with Plan of Care Patient;Family member/caregiver    Family Member Consulted husband             Patient will benefit from skilled therapeutic intervention in order to improve the following deficits and impairments:  Abnormal gait, Decreased activity tolerance, Decreased balance, Decreased coordination, Decreased endurance, Decreased knowledge of use of DME, Decreased mobility, Decreased range of motion, Decreased skin integrity, Decreased strength, Difficulty walking, Increased edema, Impaired flexibility, Postural dysfunction, Prosthetic Dependency  Visit Diagnosis: Other abnormalities of gait and mobility  Muscle weakness (generalized)  Unsteadiness on feet  Abnormal posture  Foot drop, left     Problem List Patient Active Problem List   Diagnosis Date Noted   Foot pain, right 05/08/2021   Charcot's joint, right ankle and foot    Allergic rhinitis 03/17/2021   Anxiety 03/17/2021   Diabetic polyneuropathy ([place]) 03/17/2021   External hemorrhoids 03/17/2021   History of anemia 03/17/2021   Hyperglycemia due to type 1 diabetes mellitus ([place]) 03/17/2021   Long term (current) use of insulin ([place])  03/17/2021   Personal history of other diseases of the respiratory system 03/17/2021   Recurrent major depression ([place]) 03/17/2021   Proliferative diabetic retinopathy of right eye without macular edema associated with type 1 diabetes mellitus ([place]) 12/25/2020   Stable treated proliferative diabetic retinopathy of left eye with macular edema determined by examination associated with type 1 diabetes mellitus ([place]) 12/23/2020   Vitreous hemorrhage, right eye ([place]) 12/23/2020   Cellulitis 06/11/2020   Diabetic foot infection ([place]) 06/10/2020   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 03/05/2019   Special screening for malignant neoplasms, colon 01/24/2017   Difficulty in swallowing 01/24/2017   Esophageal reflux 01/24/2017   DM (diabetes mellitus) type 1 with ketoacidosis ([place]) 03/01/2015   Diabetes mellitus type 1 with complications ([place]) 32/95/1884   Diabetic neuropathy ([place]) 03/01/2015   Hypothyroidism 03/01/2015   Depression 03/01/2015  Orthostatic hypotension 03/01/2015   Essential tremor 03/01/2015   Diabetic retinopathy ([place]) 03/01/2015   Gastroparesis due to DM ([place]) 03/01/2015   Chronic renal insufficiency 03/01/2015   Foot drop, bilateral 03/01/2015   HLD (hyperlipidemia) 03/01/2015   Obstructive sleep apnea 03/01/2015    Jamey Reas, PT, DPT 09/14/2021, 4:22 PM  [facility] [address] [place], [place], [address] Phone: [phone]   Fax:  [phone]  Name: [name] MRN: [id number] Date of Birth: 08-15-1961

## 2021-09-21 ENCOUNTER — Other Ambulatory Visit: Payer: Self-pay

## 2021-09-21 ENCOUNTER — Encounter: Payer: Self-pay | Admitting: Physical Therapy

## 2021-09-21 ENCOUNTER — Ambulatory Visit (INDEPENDENT_AMBULATORY_CARE_PROVIDER_SITE_OTHER): Payer: Managed Care, Other (non HMO) | Admitting: Physical Therapy

## 2021-09-21 DIAGNOSIS — R293 Abnormal posture: Secondary | ICD-10-CM

## 2021-09-21 DIAGNOSIS — R2681 Unsteadiness on feet: Secondary | ICD-10-CM | POA: Diagnosis not present

## 2021-09-21 DIAGNOSIS — M6281 Muscle weakness (generalized): Secondary | ICD-10-CM

## 2021-09-21 DIAGNOSIS — M21372 Foot drop, left foot: Secondary | ICD-10-CM

## 2021-09-21 DIAGNOSIS — R2689 Other abnormalities of gait and mobility: Secondary | ICD-10-CM | POA: Diagnosis not present

## 2021-09-21 DIAGNOSIS — M6249 Contracture of muscle, multiple sites: Secondary | ICD-10-CM

## 2021-09-21 NOTE — Therapy (Signed)
[facility] Therapy [address] [place], [place], [address] Phone: [phone]   Fax:  [phone]  Physical Therapy Treatment  Patient Details  Name: [name] MRN: [id number] Date of Birth: [birth year] Referring Provider (PT): Bevely Palmer Persons, [place]   Encounter Date: 09/21/2021   PT End of Session - 09/21/21 1512     Visit Number 9    Number of Visits 25    Date for PT Re-Evaluation 10/15/21    Authorization Type [employer] Managed    Authorization Time Period $50 COPAY, OOP $4000 $2719.68 MET, 60 DAYS PER CALENDER YEAR, PT/OT/ CHIRO, AFTER OOP MET    PT Start Time 1515    PT Stop Time 1600    PT Time Calculation (min) 45 min    Equipment Utilized During Treatment Gait belt    Activity Tolerance Patient tolerated treatment well;Patient limited by fatigue;Patient limited by pain    Behavior During Therapy [facility] for tasks assessed/performed             Past Medical History:  Diagnosis Date   Adhesive capsulitis 2013   Bilateral shoulders   Adhesive capsulitis of both shoulders 2013   Anemia    Cataract    Chronic kidney disease    Depression    Diabetes mellitus without complication ([place])    Diabetic retinopathy ([place]) 2010   Diabetic retinopathy ([place]) 2010   Left eye, mild   Essential tremor 2011   Bilateral hands   Foot drop, bilateral 2007   Foot fracture, left    Gastroparesis 2009   GERD (gastroesophageal reflux disease)    Headache    Hemorrhage 2011   Laser surgery x2   High potassium 2012   Normal as of 2014   Hypercholesterolemia 2010   Hyperlipidemia    Neuromuscular disorder ([place])    patient states that she has a body twitch.   Pneumonia    Postnasal drip 2010   Scratched cornea 2013   Left eye   Sleep apnea    does not wear CPAP   Stage 3 chronic kidney disease (HCC)    Thyroid disease    TMJ (temporomandibular joint disorder)    Type 1 diabetes ([place])     Past Surgical History:  Procedure Laterality Date    ABDOMINAL HYSTERECTOMY  2007   AMPUTATION Right 05/08/2021   Procedure: RIGHT BELOW KNEE AMPUTATION;  Surgeon: Newt Minion, MD;  Location: [place];  Service: Orthopedics;  Laterality: Right;   APPENDECTOMY     BALLOON DILATION N/A 01/24/2017   Procedure: BALLOON DILATION;  Surgeon: Wilford Corner, MD;  Location: [facility];  Service: Endoscopy;  Laterality: N/A;   CARPAL TUNNEL RELEASE Right 1990   CATARACT EXTRACTION Bilateral 2008   COLONOSCOPY  2007   COLONOSCOPY WITH PROPOFOL N/A 01/24/2017   Procedure: COLONOSCOPY WITH PROPOFOL;  Surgeon: Wilford Corner, MD;  Location: [facility];  Service: Endoscopy;  Laterality: N/A;   ESOPHAGOGASTRODUODENOSCOPY (EGD) WITH PROPOFOL N/A 01/24/2017   Procedure: ESOPHAGOGASTRODUODENOSCOPY (EGD) WITH PROPOFOL;  Surgeon: Wilford Corner, MD;  Location: [facility];  Service: Endoscopy;  Laterality: N/A;  moved oer MD request   EYE SURGERY Left 2012   Pars plana viterctomy, membrane peel, endolaser   ROOT CANAL  2007   ROOT CANAL  2010   Thorat dilation  2007   Throat dilation  2007   Throat dilation  2010, 0ct 2011   Sep 2010, Nov 2010, 2014   TONSILLECTOMY  1971   Trigger fingers  2008   WISDOM TOOTH EXTRACTION  1977    There were no vitals filed for this visit.   Subjective Assessment - 09/21/21 1512     Subjective She is eating 3 meals / day at kitchen table.  She has improved amount of her walking. She is doing her exercises for vestibular which is helping.    Patient is accompained by: Family member   husband, [name]   Pertinent History Right TTA, CKD, DM1, retinopathy, bil. foot drop, HA    Limitations Standing;Walking;House hold activities    Patient Stated Goals To use prosthesis to be independent in home.    Currently in Pain? No/denies    Pain Onset More than a month ago                               [facility] Adult PT Treatment/Exercise - 09/21/21 1513       Transfers   Transfers Sit to Stand;Stand  to Sit;Stand Pivot Transfers;Squat Pivot Transfers    Sit to Stand 5: Supervision;With upper extremity assist;With armrests;From chair/3-in-1;Other (comment)   to RW or locked rollator walker for stabilization   Sit to Stand Details Visual cues for safe use of DME/AE;Visual cues/gestures for sequencing;Verbal cues for technique    Stand to Sit 5: Supervision;With upper extremity assist;With armrests;To chair/3-in-1;To elevated surface;Other (comment)   from RW or locked rollator walker for stabilization   Stand to Sit Details (indicate cue type and reason) Visual cues for safe use of DME/AE;Visual cues/gestures for sequencing;Verbal cues for technique    Stand Pivot Transfers --      Ambulation/Gait   Ambulation/Gait Yes    Ambulation/Gait Assistance 5: Supervision    Ambulation Distance (Feet) 150 Feet   150' X 2   Assistive device Rollator;Prosthesis;Other (Comment)   AFO   Gait Comments --      Therapeutic Activites    Therapeutic Activities ADL's    ADL's --      Exercises   Exercises Knee/Hip      Knee/Hip Exercises: Standing   Forward Step Up --    Forward Step Up Limitations --    Step Down --    Step Down Limitations --    Rocker Board --    Rocker Board Limitations --      Prosthetics   Prosthetic Care Comments  use of baby oil over patella to decrease friction / redness.  PT instructed in adjusting ply socks with too few, too many & correct ply.  PT educated pt on fall risk, energy expenditure & stress on LLE when prosthesis is off.  Per pt request PT educated with verbal cues on traveling with prosthesis.    Current prosthetic wear tolerance (days/week)  daily    Current prosthetic wear tolerance (#hours/day)  liner most of awake hours & prosthesis ~~4-6 hours total / day. PT educated on increasing wear during awake hours of day.    Current prosthetic weight-bearing tolerance (hours/day)  Pt tolerated standing with RW support & gait with RW for 3 minutes with no increase  in limb pain. limb pain was 3-4/10 prior to standing.    Edema pitting    Education Provided Proper wear schedule/adjustment;Other (comment);Correct ply sock adjustment;Residual limb care   see prosthetic care comments   Person(s) Educated Patient;Spouse;Child(ren)    Education Method Explanation;Tactile cues;Demonstration;Verbal cues;Handout    Education Method Verbalized understanding;Tactile cues required;Verbal cues required;Needs further instruction  PT Short Term Goals - 09/14/21 1618       PT SHORT TERM GOAL #1   Title Patient verbalizes understanding of recommended wear pattern & adjusting ply socks.    Time 4    Period Weeks    Status Achieved    Target Date 09/17/21      PT SHORT TERM GOAL #2   Title Patient tolerates prosthetic liner  >10hrs total per day and prosthesis total of 4hrs without increase in skin issues.    Time 4    Period Weeks    Status Achieved    Target Date 09/17/21      PT SHORT TERM GOAL #3   Title Patient stand pivot transfer to sit on rollator seat with supervision.    Time 4    Period Weeks    Status Achieved    Target Date 09/17/21      PT SHORT TERM GOAL #4   Title Patient ambulates 100' with rollator walker, TTA prosthesis & AFO with supervision.    Time 4    Period Weeks    Status Achieved    Target Date 09/17/21      PT SHORT TERM GOAL #5   Title Patient negotiates curb with rollator walker, prosthesis & AFO with minA.    Baseline met 10/3    Time 4    Period Weeks    Status Achieved    Target Date 09/17/21               PT Long Term Goals - 07/28/21 1903       PT LONG TERM GOAL #1   Title Patient & family verbalize & demonstrate proper prosthetic care to enable safe utilization of prosthesis.    Time 12    Period Weeks    Status On-going    Target Date 10/15/21      PT LONG TERM GOAL #2   Title Patient tolerates prosthesis wear >/= 12 hours /day (if in bed during day time, then  tolerates liner wear for quick prosthetic wear for transfers) without skin or limb pain issues.    Time 12    Period Weeks    Status On-going    Target Date 10/15/21      PT LONG TERM GOAL #3   Title scooting or squat pivot transfers w/c to mat or another chair with prosthesis with supervision.    Time 12    Period Weeks    Status On-going    Target Date 10/15/21      PT LONG TERM GOAL #4   Title Standing balance with rollator walker, TTA prosthesis & AFO, reaches 10" anteriorly, scans environment & manages clothes for toileting modified independent.    Time 11    Period Weeks    Status New    Target Date 10/15/21      PT LONG TERM GOAL #5   Title Patient ambulates 38' with rollator walker, TTA prosthesis & AFO modified independent for mobility in home.    Time 11    Period Weeks    Status New    Target Date 10/15/21                   Plan - 09/21/21 1512     Clinical Impression Statement PT educated patient on prosthetic care issues and she appears to have a better understanding.  Patient's activity level continues to improve per pt & family reports.    Personal Factors and  Comorbidities Comorbidity 3+;Fitness;Past/Current Experience    Comorbidities Right TTA, CKD, DM1, retinopathy, neuropathy, bil. foot drop, HA    Examination-Activity Limitations Bed Mobility;Locomotion Level;Stand;Toileting;Transfers    Stability/Clinical Decision Making Evolving/Moderate complexity    Rehab Potential Good    PT Frequency 2x / week    PT Duration 12 weeks    PT Treatment/Interventions ADLs/Self Care Home Management;DME Instruction;Gait training;Stair training;Functional mobility training;Therapeutic activities;Therapeutic exercise;Balance training;Neuromuscular re-education;Patient/family education;Prosthetic Training;Vestibular    PT Next Visit Plan work towards [place], continue work on  standing balance to facilitate ankle/residual limb, hip & step strategies, Prosthetic gait  with rollator walker including sit to/from stand from seat, review prosthetic care & progress wear as indicated    Consulted and Agree with Plan of Care Patient;Family member/caregiver    Family Member Consulted husband             Patient will benefit from skilled therapeutic intervention in order to improve the following deficits and impairments:  Abnormal gait, Decreased activity tolerance, Decreased balance, Decreased coordination, Decreased endurance, Decreased knowledge of use of DME, Decreased mobility, Decreased range of motion, Decreased skin integrity, Decreased strength, Difficulty walking, Increased edema, Impaired flexibility, Postural dysfunction, Prosthetic Dependency  Visit Diagnosis: Other abnormalities of gait and mobility  Muscle weakness (generalized)  Unsteadiness on feet  Abnormal posture  Foot drop, left  Contracture of muscle, multiple sites     Problem List Patient Active Problem List   Diagnosis Date Noted   Foot pain, right 05/08/2021   Charcot's joint, right ankle and foot    Allergic rhinitis 03/17/2021   Anxiety 03/17/2021   Diabetic polyneuropathy ([place]) 03/17/2021   External hemorrhoids 03/17/2021   History of anemia 03/17/2021   Hyperglycemia due to type 1 diabetes mellitus ([place]) 03/17/2021   Long term (current) use of insulin ([place]) 03/17/2021   Personal history of other diseases of the respiratory system 03/17/2021   Recurrent major depression ([place]) 03/17/2021   Proliferative diabetic retinopathy of right eye without macular edema associated with type 1 diabetes mellitus ([place]) 12/25/2020   Stable treated proliferative diabetic retinopathy of left eye with macular edema determined by examination associated with type 1 diabetes mellitus ([place]) 12/23/2020   Vitreous hemorrhage, right eye ([place]) 12/23/2020   Cellulitis 06/11/2020   Diabetic foot infection ([place]) 06/10/2020   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  03/05/2019   Special screening for malignant neoplasms, colon 01/24/2017   Difficulty in swallowing 01/24/2017   Esophageal reflux 01/24/2017   DM (diabetes mellitus) type 1 with ketoacidosis ([place]) 03/01/2015   Diabetes mellitus type 1 with complications ([place]) 32/91/9166   Diabetic neuropathy ([employer]) 03/01/2015   Hypothyroidism 03/01/2015   Depression 03/01/2015   Orthostatic hypotension 03/01/2015   Essential tremor 03/01/2015   Diabetic retinopathy ([place]) 03/01/2015   Gastroparesis due to DM ([place]) 03/01/2015   Chronic renal insufficiency 03/01/2015   Foot drop, bilateral 03/01/2015   HLD (hyperlipidemia) 03/01/2015   Obstructive sleep apnea 03/01/2015    Jamey Reas, PT, DPT 09/21/2021, 4:58 PM  [facility] [address] [place], [place], [address] Phone: [phone]   Fax:  [phone]  Name: [name] MRN: [id number] Date of Birth: 05-28-1961

## 2021-09-21 NOTE — Patient Instructions (Signed)
[place]anger Socks: 1-ply is yellow color at top, 3-ply is green at top, 5-ply is navy blue at top How many ply you need depends on your limb size.  You should have even pressure on your limb when standing & walking.  Guidance points: 1. How ease it goes on? Should be some resistance. Too few it goes on too easily. Too many it takes a lot of work to get it on. 2. How many clicks you get. Especially clicks in sitting. 3. After standing or walking, check knee cap. Bottom should be just under the front lip.  Too few bottom of knee cap sits on indention. Too many bottom is above front lip. 4. Have your feet beside each other & hips over feet. Place hands on your waist. Pelvis Should be level. Too few prosthetic side will be low. Too many prosthetic side will be high.    Get ply socks correct before you leave the house. Take extra socks with you. Take one 3-ply and two 1-ply with you. This is in addition to what you are wearing.   

## 2021-09-28 ENCOUNTER — Encounter: Payer: Managed Care, Other (non HMO) | Admitting: Physical Therapy

## 2021-09-29 ENCOUNTER — Encounter: Payer: Managed Care, Other (non HMO) | Admitting: Physical Therapy

## 2021-10-05 ENCOUNTER — Encounter: Payer: Managed Care, Other (non HMO) | Admitting: Physical Therapy

## 2021-10-13 ENCOUNTER — Encounter: Payer: Managed Care, Other (non HMO) | Admitting: Physical Therapy

## 2021-10-29 ENCOUNTER — Encounter (INDEPENDENT_AMBULATORY_CARE_PROVIDER_SITE_OTHER): Payer: Managed Care, Other (non HMO) | Admitting: Ophthalmology

## 2021-11-02 ENCOUNTER — Encounter (INDEPENDENT_AMBULATORY_CARE_PROVIDER_SITE_OTHER): Payer: Managed Care, Other (non HMO) | Admitting: Ophthalmology

## 2021-11-10 ENCOUNTER — Ambulatory Visit (INDEPENDENT_AMBULATORY_CARE_PROVIDER_SITE_OTHER): Payer: Managed Care, Other (non HMO) | Admitting: Ophthalmology

## 2021-11-10 ENCOUNTER — Encounter (INDEPENDENT_AMBULATORY_CARE_PROVIDER_SITE_OTHER): Payer: Managed Care, Other (non HMO) | Admitting: Ophthalmology

## 2021-11-10 ENCOUNTER — Encounter: Payer: Self-pay | Admitting: Physical Therapy

## 2021-11-10 ENCOUNTER — Other Ambulatory Visit: Payer: Self-pay

## 2021-11-10 ENCOUNTER — Encounter (INDEPENDENT_AMBULATORY_CARE_PROVIDER_SITE_OTHER): Payer: Self-pay | Admitting: Ophthalmology

## 2021-11-10 DIAGNOSIS — E103552 Type 1 diabetes mellitus with stable proliferative diabetic retinopathy, left eye: Secondary | ICD-10-CM | POA: Diagnosis not present

## 2021-11-10 DIAGNOSIS — E103591 Type 1 diabetes mellitus with proliferative diabetic retinopathy without macular edema, right eye: Secondary | ICD-10-CM | POA: Diagnosis not present

## 2021-11-10 DIAGNOSIS — E103512 Type 1 diabetes mellitus with proliferative diabetic retinopathy with macular edema, left eye: Secondary | ICD-10-CM

## 2021-11-10 DIAGNOSIS — H4311 Vitreous hemorrhage, right eye: Secondary | ICD-10-CM

## 2021-11-10 NOTE — Therapy (Signed)
[facility] [address] [place], [place], [address] Phone: [phone]   Fax:  [phone]  Patient Details  Name: [name] MRN: [id number] Date of Birth: 09-Dec-1960 Referring Provider:  Meridee Score, MD  Encounter Date: 11/10/2021  PHYSICAL THERAPY DISCHARGE SUMMARY  Visits from Start of Care: 9  Current functional level related to goals / functional outcomes: Patient was making excellent progress with her mobility with her Transtibial Prosthesis using rollator walker.  She did not return for last 2 weeks of plan of care so PT was unable to reassess.  See PT note on 09/21/2021 for last documented status.    Remaining deficits: Patient has decreased strength & flexibility but is improved from initial evaluation.   She requires UE support like rollator walker for standing ADLs for balance & gait.     Education / Equipment: HEP & prosthetic care.    Patient agrees to discharge. Patient goals were not met. Patient is being discharged due to not returning since the last visit.   Jamey Reas, PT, DPT 11/10/2021, 2:08 PM  [facility] [address] [place], [place], [address] Phone: [phone]   Fax:  [phone]

## 2021-11-10 NOTE — Assessment & Plan Note (Signed)

## 2021-11-10 NOTE — Assessment & Plan Note (Signed)
resolved 

## 2021-11-10 NOTE — Progress Notes (Signed)
11/10/2021     CHIEF COMPLAINT Patient presents for  Chief Complaint  Patient presents with   Retina Follow Up    4 Mo F/U OU  Pt denies noticeable changes to [facility] OU since last visit. Pt denies ocular pain, flashes of light, or floaters OU.  A1c: 6.4, 03/2021 LBS: 142 this AM      HISTORY OF PRESENT ILLNESS: [name] is a 60 y.o. female who presents to the clinic today for:   HPI     Retina Follow Up           Diagnosis: Diabetic Retinopathy   Laterality: both eyes   Onset: 6 months ago   Severity: mild   Duration: 6 months   Course: stable   Comments: 4 Mo F/U OU  Pt denies noticeable changes to [place] OU since last visit. Pt denies ocular pain, flashes of light, or floaters OU.  A1c: 6.4, 03/2021 LBS: 142 this AM         Comments   6 mos fu OU oct fp. "I went to see Dr. Emily Filbert last month. I noticed we had to sit away from the screen and it was fuzzy and I mentioned that him but he said it is normal that it will come and go, but it has not changed. When he (my husband) drives it is fuzzy. Dr. Emily Filbert checked my vision and said my vision is the same." Denies new FOL or floaters.      Last edited by Nelva Nay on 11/10/2021  3:15 PM.      Referring physician: Elise Benne, MD [address] [place],  [place] [address]  HISTORICAL INFORMATION:   Selected notes from the MEDICAL RECORD NUMBER    Lab Results  Component Value Date   HGBA1C 7.0 (H) 05/08/2021     CURRENT MEDICATIONS: Current Outpatient Medications (Ophthalmic Drugs)  Medication Sig   Carboxymethylcellul-Glycerin (REFRESH OPTIVE OP) Place 1 drop into both eyes daily as needed (dry eyes).   cycloSPORINE (RESTASIS) 0.05 % ophthalmic emulsion Place 1 drop into both eyes 2 (two) times daily.    No current facility-administered medications for this visit. (Ophthalmic Drugs)   Current Outpatient Medications (Other)  Medication Sig   acetaminophen-codeine (TYLENOL #3) 300-30 MG tablet  Take 1 tablet by mouth every 4 (four) hours as needed for moderate pain.   albuterol (PROVENTIL HFA;VENTOLIN HFA) 108 (90 BASE) MCG/ACT inhaler Inhale 1 puff into the lungs every 4 (four) hours as needed for wheezing or shortness of breath.   ammonium lactate (AMLACTIN) 12 % lotion Apply 1 application topically as needed for dry skin. (Patient taking differently: Apply 1 application topically daily as needed for dry skin.)   aspirin 81 MG chewable tablet Chew 81 mg by mouth daily.   Continuous Blood Gluc Sensor (DEXCOM G6 SENSOR) MISC SMARTSIG:1 Each Topical Every 10 Days   Continuous Blood Gluc Transmit (DEXCOM G6 TRANSMITTER) MISC    diphenhydrAMINE (BENADRYL) 25 MG tablet Take 25 mg by mouth 2 (two) times daily.   doxycycline (VIBRA-TABS) 100 MG tablet Take 1 tablet (100 mg total) by mouth 2 (two) times daily.   DULERA 100-5 MCG/ACT AERO Inhale 2 puffs into the lungs 2 (two) times daily.   escitalopram (LEXAPRO) 10 MG tablet Take 10 mg by mouth at bedtime.   estradiol (ESTRACE) 2 MG tablet Take 2 mg by mouth daily.   famotidine (PEPCID) 10 MG tablet Take 10 mg by mouth 2 (two) times daily.  gabapentin (NEURONTIN) 300 MG capsule Take 1 capsule (300 mg total) by mouth 3 (three) times daily. (Patient taking differently: Take 900 mg by mouth at bedtime.)   hydrocortisone (ANUSOL-HC) 2.5 % rectal cream Place 1 application rectally 2 (two) times daily as needed for hemorrhoids or anal itching.   hydrocortisone 2.5 % cream Apply 1 application topically daily as needed (itching).    Insulin Human (INSULIN PUMP) SOLN Inject 1 each into the skin See admin instructions. Insulin pump MEDTRONIC with humalog (Will be changing to TANDEM).Works with [place] 4 CGM   insulin lispro (HUMALOG) 100 UNIT/ML injection Has with insulin pump   lamoTRIgine (LAMICTAL) 150 MG tablet TAKE 1 TABLET BY MOUTH TWICE DAILY (Patient taking differently: Take 150 mg by mouth 2 (two) times daily.)   lansoprazole (PREVACID) 30 MG  capsule Take 60 mg by mouth 2 (two) times daily before a meal.   levocetirizine (XYZAL) 5 MG tablet Take 5 mg by mouth 2 (two) times daily.    levothyroxine (SYNTHROID, LEVOTHROID) 112 MCG tablet Take 112 mcg by mouth daily.   loperamide (IMODIUM A-D) 2 MG tablet Take 2 mg by mouth 4 (four) times daily as needed for diarrhea or loose stools.   mometasone (NASONEX) 50 MCG/ACT nasal spray Place 2 sprays into the nose daily. OTC   montelukast (SINGULAIR) 10 MG tablet Take 10 mg by mouth at bedtime.   Multiple Vitamins-Minerals (MULTIVITAMIN GUMMIES ADULT PO) Take 2 each by mouth daily.    oxyCODONE (OXY IR/ROXICODONE) 5 MG immediate release tablet Take 1-2 tablets (5-10 mg total) by mouth every 4 (four) hours as needed for moderate pain (pain score 4-6).   Pancrelipase, Lip-Prot-Amyl, (PANCREAZE PO) Take 3-4 capsules by mouth daily as needed (diarrhea). Take 4 capsules with a meal and 3 capsules with a snack   primidone (MYSOLINE) 50 MG tablet TAKE 1 TABLET BY MOUTH IN THE MORNING , 1 TABLET AT NOON AND ONE AND ONE-HALF (1 & 1/2) TABLETS IN THE EVENING (Patient taking differently: Take 50 mg by mouth 3 (three) times daily.)   rosuvastatin (CRESTOR) 40 MG tablet Take 40 mg by mouth every evening.    Simethicone (GAS-X EXTRA STRENGTH PO) Take 1 tablet by mouth daily as needed (gas relief).   No current facility-administered medications for this visit. (Other)      REVIEW OF SYSTEMS:    ALLERGIES Allergies  Allergen Reactions   Ceftriaxone     Other reaction(s): diarrhea   Codeine Other (See Comments)    Told to keep on the list    Food     MSG and Lactose (cannot have any dairy products)    Milk-Related Compounds     Other reaction(s): diarrhea   Monosodium Glutamate     Other reaction(s): diarrhea   Other Nausea And Vomiting    MSG and Lactose Intolerant Other reaction(s): diarrhea Other reaction(s): hives Other reaction(s): pass out   Propranolol Hcl     Other reaction(s):  fainting   Soybean-Containing Drug Products Diarrhea    PAST MEDICAL HISTORY Past Medical History:  Diagnosis Date   Adhesive capsulitis 2013   Bilateral shoulders   Adhesive capsulitis of both shoulders 2013   Anemia    Cataract    Chronic kidney disease    Depression    Diabetes mellitus without complication ([place])    Diabetic polyneuropathy ([place])    Diabetic retinopathy ([place]) 2010   Diabetic retinopathy ([place]) 2010   Left eye, mild   Essential tremor 2011  Bilateral hands   Foot drop, bilateral 2007   Foot fracture, left    Gastroparesis 2009   GERD (gastroesophageal reflux disease)    Headache    Hemorrhage 2011   Laser surgery x2   High potassium 2012   Normal as of 2014   Hypercholesterolemia 2010   Hyperlipidemia    Neuromuscular disorder (HCC)    patient states that she has a body twitch.   Pneumonia    Postnasal drip 2010   Scratched cornea 2013   Left eye   Sleep apnea    does not wear CPAP   Stage 3 chronic kidney disease (HCC)    Thyroid disease    TMJ (temporomandibular joint disorder)    Type 1 diabetes (HCC)    Vitreous hemorrhage, right eye (HCC) 12/23/2020   Past Surgical History:  Procedure Laterality Date   ABDOMINAL HYSTERECTOMY  2007   AMPUTATION Right 05/08/2021   Procedure: RIGHT BELOW KNEE AMPUTATION;  Surgeon: [name], MD;  Location: [facility] OR;  Service: Orthopedics;  Laterality: Right;   APPENDECTOMY     BALLOON DILATION N/A 01/24/2017   Procedure: BALLOON DILATION;  Surgeon: Charlott Rakes, MD;  Location: [facility] END[facility]COPY;  Service: Endoscopy;  Laterality: N/A;   CARPAL TUNNEL RELEASE Right 1990   CATARACT EXTRACTION Bilateral 2008   COLONOSCOPY  2007   COLONOSCOPY WITH PROPOFOL N/A 01/24/2017   Procedure: COLONOSCOPY WITH PROPOFOL;  Surgeon: Charlott Rakes, MD;  Location: [facility];  Service: Endoscopy;  Laterality: N/A;   ESOPHAGOGASTRODUODENOSCOPY (EGD) WITH PROPOFOL N/A 01/24/2017   Procedure: ESOPHAGOGASTRODUODENOSCOPY (EGD)  WITH PROPOFOL;  Surgeon: Charlott Rakes, MD;  Location: [facility];  Service: Endoscopy;  Laterality: N/A;  moved oer MD request   EYE SURGERY Left 2012   Pars plana viterctomy, membrane peel, endolaser   ROOT CANAL  2007   ROOT CANAL  2010   Thorat dilation  2007   Throat dilation  2007   Throat dilation  2010, 0ct 2011   Sep 2010, Nov 2010, 2014   TONSILLECTOMY  1971   Trigger fingers  2008   WISDOM TOOTH EXTRACTION  1977    FAMILY HISTORY Family History  Problem Relation Age of Onset   Hypertension Mother    Diabetes Father    COPD Father    Pneumonia Father    Stroke Maternal Grandmother    Hypertension Maternal Grandmother    Diabetes Maternal Grandfather    Parkinson's disease Maternal Grandfather    Heart disease Maternal Grandfather    Parkinsonism Neg Hx    Neuropathy Neg Hx    Breast cancer Neg Hx     SOCIAL HISTORY Social History   Tobacco Use   Smoking status: Never   Smokeless tobacco: Never  Vaping Use   Vaping Use: Never used  Substance Use Topics   Alcohol use: No    Alcohol/week: 0.0 standard drinks   Drug use: Yes         OPHTHALMIC EXAM:  Base Eye Exam     Visual Acuity (ETDRS)       Right Left   Dist cc 20/40 -2 20/40 -2   Dist ph cc 20/25 NI    Correction: Glasses         Tonometry (Tonopen, 3:20 PM)       Right Left   Pressure 17 21         Pupils       Pupils Dark Light Shape React APD   Right PERRL 4  3 Round Slow None   Left PERRL 4 3 Round Slow None         Extraocular Movement       Right Left    Full Full         Neuro/Psych     Oriented x3: Yes   Mood/Affect: Normal         Dilation     Both eyes: 1.0% Mydriacyl, 2.5% Phenylephrine @ 3:20 PM           Slit Lamp and Fundus Exam     External Exam       Right Left   External Normal Normal         Slit Lamp Exam       Right Left   Lids/Lashes Normal Normal   Conjunctiva/Sclera White and quiet White and quiet   Cornea  Clear Clear   Anterior Chamber Deep and quiet Deep and quiet   Iris Round and reactive Round and reactive   Lens Centered posterior chamber intraocular lens Centered posterior chamber intraocular lens   Anterior Vitreous Normal Normal         Fundus Exam       Right Left   Posterior Vitreous Normal Normal   Disc Normal Normal   C/D Ratio 0.2 0.2   Macula Microaneurysms, no macular thickening, no clinically significant macular edema Microaneurysms, no macular thickening, no clinically significant macular edema   Vessels PDR-quiet PDR-quiet   Periphery Good PRP nearly 360 room inferonasal for more PRP in the future Complete PRP 360 OS            IMAGING AND PROCEDURES  Imaging and Procedures for 11/10/21  OCT, Retina - OU - Both Eyes       Right Eye Quality was good. Scan locations included subfoveal. Central Foveal Thickness: 227. Progression has been stable. Findings include normal foveal contour.   Left Eye Quality was good. Scan locations included subfoveal. Central Foveal Thickness: 238. Progression has been stable. Findings include normal foveal contour.      Color Fundus Photography Optos - OU - Both Eyes       Right Eye Progression has worsened. Disc findings include normal observations. Vessels : Neovascularization.   Left Eye Progression has been stable. Disc findings include normal observations. Macula : microaneurysms. Vessels : normal observations. Periphery : normal observations.   Notes Moderate scattered preretinal and vitreous hemorrhage noted particularly nasal to the nerve OD.  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 The patient was advised to maintain good glucose, blood pressure, monitor kidney function and serum lipid control as advised by personal physician. Rare risk for reactivation of progression exist with untreated severe anemia, untreated renal failure, untreated heart failure, and smoking. Complete avoidance of smoking was recommended. The chance of recurrent proliferative diabetic retinopathy was discussed as well as the chance of vitreous hemorrhage for which further treatments may be necessary.   Explained to the patient that the quiescent  proliferative diabetic retinopathy disease is unlikely to ever worsen.  Worsening factors would include however severe anemia, hypertension out-of-control or impending renal failure.  Vitreous hemorrhage,  right eye ([place]) resolved     ICD-10-CM   1. Proliferative diabetic retinopathy of right eye without macular edema associated with type 1 diabetes mellitus (HCC)  E10.3591 OCT, Retina - OU - Both Eyes    Color Fundus Photography Optos - OU - Both Eyes    2. Stable treated proliferative diabetic retinopathy of left eye with macular edema determined by examination associated with type 1 diabetes mellitus (HCC)  E10.3552 OCT, Retina - OU - Both Eyes   [id number] Color Fundus Photography Optos - OU - Both Eyes    3. Vitreous hemorrhage, right eye (HCC)  H43.11       1.Stable retinopathy OU, will observe  2.  Clear media will monitor follow-up in 9 months  3.  Ophthalmic Meds Ordered this visit:  No orders of the defined types were placed in this encounter.      Return in about 9 months (around 08/11/2022) for DILATE OU, COLOR FP, OCT.  There are no Patient Instructions on file for this visit.   Explained the diagnoses, plan, and follow up with the patient and they expressed understanding.  Patient expressed understanding of the importance of proper follow up care.   Clent Demark Dorella Laster M.D. Diseases &  Surgery of the Retina and Vitreous Retina & Diabetic [place] 11/10/21     Abbreviations: M myopia (nearsighted); A astigmatism; H hyperopia (farsighted); P presbyopia; Mrx spectacle prescription;  CTL contact lenses; OD right eye; OS left eye; OU both eyes  XT exotropia; ET esotropia; PEK punctate epithelial keratitis; PEE punctate epithelial erosions; DES dry eye syndrome; MGD meibomian gland dysfunction; ATs artificial tears; PFAT's preservative free artificial tears; [place] nuclear sclerotic cataract; PSC posterior subcapsular cataract; ERM epi-retinal membrane; PVD posterior vitreous detachment; RD retinal detachment; DM diabetes mellitus; DR diabetic retinopathy; NPDR non-proliferative diabetic retinopathy; PDR proliferative diabetic retinopathy; CSME clinically significant macular edema; DME diabetic macular edema; dbh dot blot hemorrhages; CWS cotton wool spot; POAG primary open angle glaucoma; C/D cup-to-disc ratio; HVF humphrey visual field; GVF goldmann visual field; OCT optical coherence tomography; IOP intraocular pressure; BRVO Branch retinal vein occlusion; CRVO central retinal vein occlusion; CRAO central retinal artery occlusion; BRAO branch retinal artery occlusion; RT retinal tear; SB scleral buckle; PPV pars plana vitrectomy; VH Vitreous hemorrhage; PRP panretinal laser photocoagulation; IVK intravitreal kenalog; VMT vitreomacular traction; MH Macular hole;  NVD neovascularization of the disc; NVE neovascularization elsewhere; AREDS age related eye disease study; ARMD age related macular degeneration; POAG primary open angle glaucoma; EBMD epithelial/anterior basement membrane dystrophy; ACIOL anterior chamber intraocular lens; IOL intraocular lens; PCIOL posterior chamber intraocular lens; Phaco/IOL phacoemulsification with intraocular lens placement; [place] photorefractive keratectomy; LASIK laser assisted in situ keratomileusis; HTN hypertension; DM diabetes mellitus; COPD chronic  obstructive pulmonary disease

## 2022-03-11 ENCOUNTER — Other Ambulatory Visit: Payer: Self-pay | Admitting: Family Medicine

## 2022-03-11 DIAGNOSIS — Z1231 Encounter for screening mammogram for malignant neoplasm of breast: Secondary | ICD-10-CM

## 2022-03-17 ENCOUNTER — Other Ambulatory Visit: Payer: Self-pay | Admitting: Family Medicine

## 2022-03-17 DIAGNOSIS — N644 Mastodynia: Secondary | ICD-10-CM

## 2022-03-31 ENCOUNTER — Ambulatory Visit
Admission: RE | Admit: 2022-03-31 | Discharge: 2022-03-31 | Disposition: A | Discharge status: Home / Self Care | Payer: Managed Care, Other (non HMO) | Source: Ambulatory Visit | Attending: Family Medicine | Admitting: Family Medicine

## 2022-03-31 ENCOUNTER — Ambulatory Visit: Payer: Managed Care, Other (non HMO)

## 2022-03-31 DIAGNOSIS — N644 Mastodynia: Secondary | ICD-10-CM

## 2022-08-17 ENCOUNTER — Encounter (INDEPENDENT_AMBULATORY_CARE_PROVIDER_SITE_OTHER): Payer: Self-pay | Admitting: Ophthalmology

## 2022-08-17 ENCOUNTER — Ambulatory Visit (INDEPENDENT_AMBULATORY_CARE_PROVIDER_SITE_OTHER): Payer: Managed Care, Other (non HMO) | Admitting: Ophthalmology

## 2022-08-17 DIAGNOSIS — E103512 Type 1 diabetes mellitus with proliferative diabetic retinopathy with macular edema, left eye: Secondary | ICD-10-CM

## 2022-08-17 DIAGNOSIS — E103552 Type 1 diabetes mellitus with stable proliferative diabetic retinopathy, left eye: Secondary | ICD-10-CM

## 2022-08-17 DIAGNOSIS — E103591 Type 1 diabetes mellitus with proliferative diabetic retinopathy without macular edema, right eye: Secondary | ICD-10-CM

## 2022-08-17 NOTE — Assessment & Plan Note (Signed)

## 2022-08-17 NOTE — Progress Notes (Signed)
08/17/2022     CHIEF COMPLAINT Patient presents for  Chief Complaint  Patient presents with   Diabetic Retinopathy without Macular Edema      HISTORY OF PRESENT ILLNESS: [name] is a 61 y.o. female who presents to the clinic today for:   HPI   9 MOS FOR DILATE OU, COLOR FP, OCT. Pt stated no changes in vision . Pts last A1C was 6.9 Pts blood sugar was 124 Last edited by Angeline Slim on 08/17/2022  3:04 PM.      Referring physician: Soundra Pilon, [name] [address] [place],  [place] [address]  HISTORICAL INFORMATION:   Selected notes from the MEDICAL RECORD NUMBER    Lab Results  Component Value Date   HGBA1C 7.0 (H) 05/08/2021     CURRENT MEDICATIONS: Current Outpatient Medications (Ophthalmic Drugs)  Medication Sig   Carboxymethylcellul-Glycerin (REFRESH OPTIVE OP) Place 1 drop into both eyes daily as needed (dry eyes).   cycloSPORINE (RESTASIS) 0.05 % ophthalmic emulsion Place 1 drop into both eyes 2 (two) times daily.    No current facility-administered medications for this visit. (Ophthalmic Drugs)   Current Outpatient Medications (Other)  Medication Sig   acetaminophen-codeine (TYLENOL #3) 300-30 MG tablet Take 1 tablet by mouth every 4 (four) hours as needed for moderate pain.   albuterol (PROVENTIL HFA;VENTOLIN HFA) 108 (90 BASE) MCG/ACT inhaler Inhale 1 puff into the lungs every 4 (four) hours as needed for wheezing or shortness of breath.   ammonium lactate (AMLACTIN) 12 % lotion Apply 1 application topically as needed for dry skin. (Patient taking differently: Apply 1 application topically daily as needed for dry skin.)   aspirin 81 MG chewable tablet Chew 81 mg by mouth daily.   Continuous Blood Gluc Sensor (DEXCOM G6 SENSOR) MISC SMARTSIG:1 Each Topical Every 10 Days   Continuous Blood Gluc Transmit (DEXCOM G6 TRANSMITTER) MISC    diphenhydrAMINE (BENADRYL) 25 MG tablet Take 25 mg by mouth 2 (two) times daily.   doxycycline (VIBRA-TABS) 100 MG  tablet Take 1 tablet (100 mg total) by mouth 2 (two) times daily.   DULERA 100-5 MCG/ACT AERO Inhale 2 puffs into the lungs 2 (two) times daily.   escitalopram (LEXAPRO) 10 MG tablet Take 10 mg by mouth at bedtime.   estradiol (ESTRACE) 2 MG tablet Take 2 mg by mouth daily.   famotidine (PEPCID) 10 MG tablet Take 10 mg by mouth 2 (two) times daily.   gabapentin (NEURONTIN) 300 MG capsule Take 1 capsule (300 mg total) by mouth 3 (three) times daily. (Patient taking differently: Take 900 mg by mouth at bedtime.)   hydrocortisone (ANUSOL-HC) 2.5 % rectal cream Place 1 application rectally 2 (two) times daily as needed for hemorrhoids or anal itching.   hydrocortisone 2.5 % cream Apply 1 application topically daily as needed (itching).    Insulin Human (INSULIN PUMP) SOLN Inject 1 each into the skin See admin instructions. Insulin pump MEDTRONIC with humalog (Will be changing to TANDEM).Works with DEXCOM 4 CGM   insulin lispro (HUMALOG) 100 UNIT/ML injection Has with insulin pump   lamoTRIgine (LAMICTAL) 150 MG tablet TAKE 1 TABLET BY MOUTH TWICE DAILY (Patient taking differently: Take 150 mg by mouth 2 (two) times daily.)   lansoprazole (PREVACID) 30 MG capsule Take 60 mg by mouth 2 (two) times daily before a meal.   levocetirizine (XYZAL) 5 MG tablet Take 5 mg by mouth 2 (two) times daily.    levothyroxine (SYNTHROID, LEVOTHROID) 112 MCG  tablet Take 112 mcg by mouth daily.   loperamide (IMODIUM A-D) 2 MG tablet Take 2 mg by mouth 4 (four) times daily as needed for diarrhea or loose stools.   mometasone (NASONEX) 50 MCG/ACT nasal spray Place 2 sprays into the nose daily. OTC   montelukast (SINGULAIR) 10 MG tablet Take 10 mg by mouth at bedtime.   Multiple Vitamins-Minerals (MULTIVITAMIN GUMMIES ADULT PO) Take 2 each by mouth daily.    oxyCODONE (OXY IR/ROXICODONE) 5 MG immediate release tablet Take 1-2 tablets (5-10 mg total) by mouth every 4 (four) hours as needed for moderate pain (pain score 4-6).    Pancrelipase, Lip-Prot-Amyl, (PANCREAZE PO) Take 3-4 capsules by mouth daily as needed (diarrhea). Take 4 capsules with a meal and 3 capsules with a snack   primidone (MYSOLINE) 50 MG tablet TAKE 1 TABLET BY MOUTH IN THE MORNING , 1 TABLET AT NOON AND ONE AND ONE-HALF (1 & 1/2) TABLETS IN THE EVENING (Patient taking differently: Take 50 mg by mouth 3 (three) times daily.)   rosuvastatin (CRESTOR) 40 MG tablet Take 40 mg by mouth every evening.    Simethicone (GAS-X EXTRA STRENGTH PO) Take 1 tablet by mouth daily as needed (gas relief).   No current facility-administered medications for this visit. (Other)      REVIEW OF SYSTEMS: ROS   Negative for: Constitutional, Gastrointestinal, Neurological, Skin, Genitourinary, Musculoskeletal, HENT, Endocrine, Cardiovascular, Eyes, Respiratory, Psychiatric, Allergic/Imm, Heme/Lymph Last edited by Angeline SlimMa, [place] on 08/17/2022  3:04 PM.       ALLERGIES Allergies  Allergen Reactions   Ceftriaxone     Other reaction(s): diarrhea   Codeine Other (See Comments)    Told to keep on the list    Food     MSG and Lactose (cannot have any dairy products)    Milk-Related Compounds     Other reaction(s): diarrhea   Monosodium Glutamate     Other reaction(s): diarrhea   Other Nausea And Vomiting    MSG and Lactose Intolerant Other reaction(s): diarrhea Other reaction(s): hives Other reaction(s): pass out   Propranolol Hcl     Other reaction(s): fainting   Soybean-Containing Drug Products Diarrhea    PAST MEDICAL HISTORY Past Medical History:  Diagnosis Date   Adhesive capsulitis 2013   Bilateral shoulders   Adhesive capsulitis of both shoulders 2013   Anemia    Cataract    Chronic kidney disease    Depression    Diabetes mellitus without complication (HCC)    Diabetic polyneuropathy (HCC)    Diabetic retinopathy (HCC) 2010   Diabetic retinopathy (HCC) 2010   Left eye, mild   Essential tremor 2011   Bilateral hands   Foot drop, bilateral  2007   Foot fracture, left    Gastroparesis 2009   GERD (gastroesophageal reflux disease)    Headache    Hemorrhage 2011   Laser surgery x2   High potassium 2012   Normal as of 2014   Hypercholesterolemia 2010   Hyperlipidemia    Neuromuscular disorder (HCC)    patient states that she has a body twitch.   Pneumonia    Postnasal drip 2010   Scratched cornea 2013   Left eye   Sleep apnea    does not wear CPAP   Stage 3 chronic kidney disease (HCC)    Thyroid disease    TMJ (temporomandibular joint disorder)    Type 1 diabetes (HCC)    Vitreous hemorrhage, right eye (HCC) 12/23/2020   Past Surgical History:  Procedure Laterality Date   ABDOMINAL HYSTERECTOMY  2007   AMPUTATION Right 05/08/2021   Procedure: RIGHT BELOW KNEE AMPUTATION;  Surgeon: Newt Minion, MD;  Location: [facility];  Service: Orthopedics;  Laterality: Right;   APPENDECTOMY     BALLOON DILATION N/A 01/24/2017   Procedure: BALLOON DILATION;  Surgeon: Wilford Corner, MD;  Location: [facility];  Service: Endoscopy;  Laterality: N/A;   CARPAL TUNNEL RELEASE Right 1990   CATARACT EXTRACTION Bilateral 2008   COLONOSCOPY  2007   COLONOSCOPY WITH PROPOFOL N/A 01/24/2017   Procedure: COLONOSCOPY WITH PROPOFOL;  Surgeon: Wilford Corner, MD;  Location: [facility];  Service: Endoscopy;  Laterality: N/A;   ESOPHAGOGASTRODUODENOSCOPY (EGD) WITH PROPOFOL N/A 01/24/2017   Procedure: ESOPHAGOGASTRODUODENOSCOPY (EGD) WITH PROPOFOL;  Surgeon: Wilford Corner, MD;  Location: [facility];  Service: Endoscopy;  Laterality: N/A;  moved oer MD request   EYE SURGERY Left 2012   Pars plana viterctomy, membrane peel, endolaser   ROOT CANAL  2007   ROOT CANAL  2010   Thorat dilation  2007   Throat dilation  2007   Throat dilation  2010, 0ct 2011   Sep 2010, Nov 2010, 2014   TONSILLECTOMY  1971   Trigger fingers  2008   WISDOM TOOTH EXTRACTION  1977    FAMILY HISTORY Family History  Problem Relation Age of Onset    Hypertension Mother    Diabetes Father    COPD Father    Pneumonia Father    Stroke Maternal Grandmother    Hypertension Maternal Grandmother    Diabetes Maternal Grandfather    Parkinson's disease Maternal Grandfather    Heart disease Maternal Grandfather    Parkinsonism Neg Hx    Neuropathy Neg Hx    Breast cancer Neg Hx     SOCIAL HISTORY Social History   Tobacco Use   Smoking status: Never   Smokeless tobacco: Never  Vaping Use   Vaping Use: Never used  Substance Use Topics   Alcohol use: No    Alcohol/week: 0.0 standard drinks of alcohol   Drug use: Yes         OPHTHALMIC EXAM:  Base Eye Exam     Visual Acuity (ETDRS)       Right Left   Dist cc 20/25 -1 20/30 +2   Dist ph cc  NI    Correction: Glasses         Tonometry (Tonopen, 3:13 PM)       Right Left   Pressure 14 16         Pupils       Pupils APD   Right PERRL None   Left PERRL None         Visual Fields       Left Right    Full Full         Extraocular Movement       Right Left    Full Full         Neuro/Psych     Oriented x3: Yes   Mood/Affect: Normal         Dilation     Both eyes: 1.0% Mydriacyl, 2.5% Phenylephrine @ 3:13 PM           Slit Lamp and Fundus Exam     External Exam       Right Left   External Normal Normal         Slit Lamp Exam       Right Left  Lids/Lashes Normal Normal   Conjunctiva/Sclera White and quiet White and quiet   Cornea Clear Clear   Anterior Chamber Deep and quiet Deep and quiet   Iris Round and reactive Round and reactive   Lens Centered posterior chamber intraocular lens Centered posterior chamber intraocular lens   Anterior Vitreous Normal Normal         Fundus Exam       Right Left   Posterior Vitreous Normal Normal   Disc Normal Normal   C/D Ratio 0.2 0.2   Macula Microaneurysms, no macular thickening, no clinically significant macular edema Microaneurysms, no macular thickening, no clinically  significant macular edema   Vessels PDR-quiet PDR-quiet   Periphery Good PRP nearly 360 room inferonasal for more PRP in the future Complete PRP 360 OS            IMAGING AND PROCEDURES  Imaging and Procedures for 08/17/22  OCT, Retina - OU - Both Eyes       Right Eye Quality was good. Scan locations included subfoveal. Central Foveal Thickness: 224. Progression has been stable. Findings include normal foveal contour.   Left Eye Quality was good. Scan locations included subfoveal. Central Foveal Thickness: 236. Progression has been stable. Findings include normal foveal contour.   Notes No active maculopathy      Color Fundus Photography Optos - OU - Both Eyes       Right Eye Progression has worsened. Disc findings include normal observations. Vessels : Neovascularization.   Left Eye Progression has been stable. Disc findings include normal observations. Macula : microaneurysms. Vessels : normal observations. Periphery : normal observations.   Notes Clear media OU  Room for more laser PRP posterior to the equator inferonasal  OS with quiescent proliferative diabetic retinopathy in excellent PRP anterior and posterior to the equator 360              ASSESSMENT/PLAN:  Stable treated proliferative diabetic retinopathy of left eye with macular edema determined by examination associated with type 1 diabetes mellitus ([place]) The nature of regressed proliferative diabetic retinopathy was discussed with the patient. The patient was advised to maintain good glucose, blood pressure, monitor kidney function and serum lipid control as advised by personal physician. Rare risk for reactivation of progression exist with untreated severe anemia, untreated renal failure, untreated heart failure, and smoking. Complete avoidance of smoking was recommended. The chance of recurrent proliferative diabetic retinopathy was discussed as well as the chance of vitreous hemorrhage for which  further treatments may be necessary.   Explained to the patient that the quiescent  proliferative diabetic retinopathy disease is unlikely to ever worsen.  Worsening factors would include however severe anemia, hypertension out-of-control or impending renal failure.  Proliferative diabetic retinopathy of right eye without macular edema associated with type 1 diabetes mellitus ([place]) The nature of regressed proliferative diabetic retinopathy was discussed with the patient. The patient was advised to maintain good glucose, blood pressure, monitor kidney function and serum lipid control as advised by personal physician. Rare risk for reactivation of progression exist with untreated severe anemia, untreated renal failure, untreated heart failure, and smoking. Complete avoidance of smoking was recommended. The chance of recurrent proliferative diabetic retinopathy was discussed as well as the chance of vitreous hemorrhage for which further treatments may be necessary.   Explained to the patient that the quiescent  proliferative diabetic retinopathy disease is unlikely to ever worsen.  Worsening factors would include however severe anemia, hypertension out-of-control or impending renal failure.  ICD-10-CM   1. Proliferative diabetic retinopathy of right eye without macular edema associated with type 1 diabetes mellitus (HCC)  E10.3591 OCT, Retina - OU - Both Eyes    Color Fundus Photography Optos - OU - Both Eyes    2. Stable treated proliferative diabetic retinopathy of left eye with macular edema determined by examination associated with type 1 diabetes mellitus ([place])  [id number]       1.  OU with good peripheral PRP halting progression of PDR at an early stage.  No signs of progression of neovascularization.  No signs of recurrence of hemorrhage.  No active maculopathy OU.  Continue sugar control monitoring in order to prevent progression  2.  3.  Ophthalmic Meds Ordered this visit:   No orders of the defined types were placed in this encounter.      Return in about 9 months (around 05/18/2023) for DILATE OU, COLOR FP.  There are no Patient Instructions on file for this visit.   Explained the diagnoses, plan, and follow up with the patient and they expressed understanding.  Patient expressed understanding of the importance of proper follow up care.   Clent Demark Anaston Koehn M.D. Diseases & Surgery of the Retina and Vitreous [facility] Diabetic [facility] 08/17/22     Abbreviations: M myopia (nearsighted); A astigmatism; H hyperopia (farsighted); P presbyopia; Mrx spectacle prescription;  CTL contact lenses; OD right eye; OS left eye; OU both eyes  XT exotropia; ET esotropia; PEK punctate epithelial keratitis; PEE punctate epithelial erosions; DES dry eye syndrome; MGD meibomian gland dysfunction; ATs artificial tears; PFAT's preservative free artificial tears; [place] nuclear sclerotic cataract; PSC posterior subcapsular cataract; ERM epi-retinal membrane; PVD posterior vitreous detachment; RD retinal detachment; DM diabetes mellitus; DR diabetic retinopathy; NPDR non-proliferative diabetic retinopathy; PDR proliferative diabetic retinopathy; CSME clinically significant macular edema; DME diabetic macular edema; dbh dot blot hemorrhages; CWS cotton wool spot; POAG primary open angle glaucoma; C/D cup-to-disc ratio; HVF humphrey visual field; GVF goldmann visual field; OCT optical coherence tomography; IOP intraocular pressure; BRVO Branch retinal vein occlusion; CRVO central retinal vein occlusion; CRAO central retinal artery occlusion; BRAO branch retinal artery occlusion; RT retinal tear; SB scleral buckle; PPV pars plana vitrectomy; VH Vitreous hemorrhage; PRP panretinal laser photocoagulation; IVK intravitreal kenalog; VMT vitreomacular traction; MH Macular hole;  NVD neovascularization of the disc; NVE neovascularization elsewhere; AREDS age related eye disease study; ARMD age related  macular degeneration; POAG primary open angle glaucoma; EBMD epithelial/anterior basement membrane dystrophy; ACIOL anterior chamber intraocular lens; IOL intraocular lens; PCIOL posterior chamber intraocular lens; Phaco/IOL phacoemulsification with intraocular lens placement; [place] photorefractive keratectomy; LASIK laser assisted in situ keratomileusis; HTN hypertension; DM diabetes mellitus; COPD chronic obstructive pulmonary disease

## 2023-03-14 ENCOUNTER — Encounter (HOSPITAL_BASED_OUTPATIENT_CLINIC_OR_DEPARTMENT_OTHER): Payer: Managed Care, Other (non HMO) | Attending: Internal Medicine | Admitting: General Surgery

## 2023-03-14 DIAGNOSIS — E1051 Type 1 diabetes mellitus with diabetic peripheral angiopathy without gangrene: Secondary | ICD-10-CM | POA: Diagnosis not present

## 2023-03-14 DIAGNOSIS — E1040 Type 1 diabetes mellitus with diabetic neuropathy, unspecified: Secondary | ICD-10-CM | POA: Diagnosis not present

## 2023-03-14 DIAGNOSIS — N1831 Chronic kidney disease, stage 3a: Secondary | ICD-10-CM | POA: Diagnosis not present

## 2023-03-14 DIAGNOSIS — E10621 Type 1 diabetes mellitus with foot ulcer: Secondary | ICD-10-CM | POA: Diagnosis present

## 2023-03-14 DIAGNOSIS — E1022 Type 1 diabetes mellitus with diabetic chronic kidney disease: Secondary | ICD-10-CM | POA: Insufficient documentation

## 2023-03-14 DIAGNOSIS — L97522 Non-pressure chronic ulcer of other part of left foot with fat layer exposed: Secondary | ICD-10-CM | POA: Diagnosis not present

## 2023-03-14 NOTE — Progress Notes (Addendum)
[name], [name] ([id number]) [id number] Page 1 of 3 Visit Report for 03/14/2023 Problem List Details Patient Name: Date of Service: [name] 03/14/2023 9:00 A M Medical Record Number: [id number] Patient Account Number: [id number] Date of Birth/Sex: Treating RN: 10-29-1961 (62 y.o. 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 03/14/2023 9:00 A M Medical Record Number: [id number] Patient Account Number: [id number] Date of Birth/Sex: Treating RN: 16-Jul-1961 (62 y.o. Female) [name] Primary Care Provider: Peri Maris Other Clinician: Referring Provider: Treating Provider/Extender: Deanna Artis in Treatment: 0 Information Obtained From Patient Constitutional Symptoms (General Health) Complaints and Symptoms: Negative for: Fatigue; Fever; Chills; Marked Weight Change Eyes Complaints and Symptoms: Positive for: Glasses / Contacts Negative for: Dry Eyes; Vision Changes Medical History: Positive for: Cataracts - bil extractions Negative for: Glaucoma; Optic  Neuritis Past Medical History Notes: diabetic retinopathy [name] ([id number]) [id number] Page 2 of 3 Ear/Nose/Mouth/Throat Complaints and Symptoms: Negative for: Chronic sinus problems or rhinitis Medical History: Negative for: Chronic sinus problems/congestion; Middle ear problems Respiratory Complaints and Symptoms: Negative for: Chronic or frequent coughs; Shortness of Breath Medical History: Positive for: Asthma; Sleep Apnea Negative for: Aspiration; Chronic Obstructive Pulmonary Disease (COPD); Pneumothorax; Tuberculosis Cardiovascular Complaints and Symptoms: Negative for: Chest pain Medical History: Positive for: Peripheral Venous Disease Negative for: Angina; Arrhythmia; Congestive Heart Failure; Coronary Artery Disease; Deep Vein Thrombosis; Hypertension; Hypotension; Myocardial Infarction; Peripheral Arterial Disease; Phlebitis; Vasculitis Past Medical History Notes: hypotension Gastrointestinal Complaints and Symptoms: Negative for: Frequent diarrhea; Nausea; Vomiting Medical History: Negative for: Cirrhosis ; Colitis; Crohns; Hepatitis A; Hepatitis B; Hepatitis C Past Medical History Notes: gastroporesis, GERD, esophageal stricture Endocrine Complaints and Symptoms: Negative for: Heat/cold intolerance Medical History: Positive for: Type I Diabetes Negative for: Type II Diabetes Time with diabetes: 30 Treated with: Insulin Blood sugar tested every day: Yes Tested : dexcom Genitourinary Complaints and Symptoms: Negative for: Frequent urination Medical History: Negative for: End Stage Renal Disease Past Medical History Notes: CKD stage 3 Integumentary (Skin) Complaints and Symptoms: Positive for: Wounds - left lateral foot Medical History: Negative for: History of Burn Musculoskeletal Complaints and Symptoms: Negative for: Muscle Pain; Muscle Weakness Medical History: Positive for: Osteomyelitis - hx right  foot Negative for: Gout; Rheumatoid Arthritis; Osteoarthritis Neurologic Complaints and Symptoms: Positive for: Numbness/parasthesias [name] ([id number]) [id number] Page 3 of 3 Medical History: Positive for: Neuropathy Negative for: Dementia; Quadriplegia; Paraplegia; Seizure Disorder Psychiatric Complaints and Symptoms: Positive for: Claustrophobia Medical History: Positive for: Confinement Anxiety Negative for: Anorexia/bulimia Hematologic/Lymphatic Medical History: Negative for: Anemia; Hemophilia; Human Immunodeficiency Virus; Lymphedema; Sickle Cell Disease Immunological Medical History: Negative for: Lupus Erythematosus; Raynauds; Scleroderma Oncologic Medical History: Negative for: Received Chemotherapy; Received Radiation HBO Extended History Items Eyes: Cataracts Immunizations Pneumococcal Vaccine: Received Pneumococcal Vaccination: Yes Received Pneumococcal Vaccination On or After 60th Birthday: No Implantable Devices None Hospitalization /  Surgery History Type of Hospitalization/Surgery right BKA esophageal balloon dilitation eye surgery trigger finger release bil cataract extraction abdominal hysterectomy [place] release right tonsillectomy appendectomy Family and Social History Cancer: Yes - Father; Diabetes: Yes - Father,Maternal Grandparents; Heart Disease: No; Hereditary Spherocytosis: No; Hypertension: Yes - Mother; Kidney Disease: No; Lung Disease: Yes - Father; Seizures: No; Stroke: No; Thyroid Problems: Yes - Mother; Tuberculosis: No; Never smoker; Marital Status - Married; Alcohol Use: Never; Drug Use: No History; Caffeine Use: Rarely; Financial Concerns: No; Food, Clothing or Shelter Needs: No; Support System Lacking: No; Transportation Concerns: No Electronic Signature(s) Unsigned Entered By: [name] on 03/14/2023 09:33:49 Signature(s): Date(s):

## 2023-03-15 NOTE — Progress Notes (Signed)
HALLIE, ERTL ([id number]) [id number] Page 1 of 8 Visit Report for 03/14/2023 Allergy List Details Patient Name: Date of Service: Car[place], Boutin [name] 03/14/2023 9:00 A M Medical Record Number: [id number] Patient Account Number: [id number] Date of Birth/Sex: Treating RN: 02-05-1961 (62 y.o. Tommye Standard Primary Care Kristina Bertone: Peri Maris Other Clinician: Referring Francine Hannan: Treating Larosa Rhines/Extender: Deanna Artis in Treatment: 0 Allergies Active Allergies codeine ceftriaxone Milk Containing Products (Dairy) Reaction: diarrhea monosodium glutamate Reaction: diarrhea, nausea propranolol soybean Reaction: dairrhea Allergy Notes Electronic Signature(s) Signed: 03/14/2023 5:20:38 PM By: [name] RN, BSN Entered By: [name] on 03/14/2023 09:25:43 -------------------------------------------------------------------------------- Arrival Information Details Patient Name: Date of Service: [name]. 03/14/2023 9:00 A M Medical Record Number: [id number] Patient Account Number: [id number] Date of Birth/Sex: Treating RN: [birth year] (61 y.o. F) Primary Care Lyza Houseworth: Peri Maris Other Clinician: Referring Tanyah Debruyne: Treating Demetric Dunnaway/Extender: Deanna Artis in Treatment: 0 Visit Information Patient Arrived: Wheel Chair Arrival Time: 09:08 Accompanied By: husband Transfer Assistance: Manual Patient Identification Verified: Yes Secondary Verification Process Completed: Yes History Since Last Visit All ordered tests and consults were completed: No Added or deleted any medications: No Any new allergies or adverse reactions: No Had a fall or experienced change in activities of daily living that may affect risk of falls: No Signs or symptoms of abuse/neglect since last visito No Hospitalized since last visit: No Implantable device outside of the clinic excluding cellular tissue based products  placed in the center since last visit: No Has Footwear/Offloading in Place as Prescribed: Yes Left: Other: Electronic Signature(s) Signed: 03/14/2023 5:20:38 PM By: [name] RN, BSN Entered By: [name] on 03/14/2023 09:22:00 [name] ([id number]) [id number] Page 2 of 8 -------------------------------------------------------------------------------- Clinic Level of Care Assessment Details Patient Name: Date of Service: LUVIAN[place], [name] 03/14/2023 9:00 A M Medical Record Number: [id number] Patient Account Number: [id number] Date of Birth/Sex: Treating RN: 1961-11-27 (62 y.o. Tommye Standard Primary Care Ulyses Panico: Peri Maris Other Clinician: Referring Lashawnda Hancox: Treating Lilton Pare/Extender: Deanna Artis in Treatment: 0 Clinic Level of Care Assessment Items TOOL 1 Quantity Score []  - 0 Use when EandM and Procedure is performed on INITIAL visit ASSESSMENTS - Nursing Assessment / Reassessment X- 1 20 General Physical Exam (combine w/ comprehensive assessment (listed just below) when performed on new pt. evals) X- 1 25 Comprehensive Assessment (HX, ROS, Risk Assessments, Wounds Hx, etc.) ASSESSMENTS - Wound and Skin Assessment / Reassessment []  - 0 Dermatologic / Skin Assessment (not related to wound area) ASSESSMENTS - Ostomy and/or Continence Assessment and Care []  - 0 Incontinence Assessment and Management []  - 0 Ostomy Care Assessment and Management (repouching, etc.) PROCESS - Coordination of Care X - Simple Patient / Family Education for ongoing care 1 15 []  - 0 Complex (extensive) Patient / Family Education for ongoing care X- 1 10 Staff obtains Chiropractor, Records, T Results / Process Orders est []  - 0 Staff telephones HHA, Nursing Homes / Clarify orders / etc []  - 0 Routine Transfer to another Facility (non-emergent condition) []  - 0 Routine Hospital Admission (non-emergent condition) X- 1 15 New  Admissions / Manufacturing engineer / Ordering NPWT Apligraf, etc. , []  - 0 Emergency Hospital Admission (emergent condition) PROCESS - Special Needs []  - 0 Pediatric / Minor Patient Management []  - 0 Isolation Patient Management []  - 0 Hearing / Language / Visual special needs []  - 0 Assessment of Community assistance (transportation, D/C planning, etc.) []  - 0 Additional assistance /  Altered mentation []  - 0 Support Surface(s) Assessment (bed, cushion, seat, etc.) INTERVENTIONS - Miscellaneous []  - 0 External ear exam []  - 0 Patient Transfer (multiple staff / Nurse, adult / Similar devices) []  - 0 Simple Staple / Suture removal (25 or less) []  - 0 Complex Staple / Suture removal (26 or more) []  - 0 Hypo/Hyperglycemic Management (do not check if billed separately) X- 1 15 Ankle / Brachial Index (ABI) - do not check if billed separately Has the patient been seen at the hospital within the last three years: Yes Total Score: 100 Level Of Care: New/Established - Level 3 Electronic Signature(s) Signed: 03/14/2023 5:20:38 PM By: [name] RN, BSN Entered By: [name] on 03/14/2023 10:27:59 [name] ([id number]) [id number] Page 3 of 8 -------------------------------------------------------------------------------- Encounter Discharge Information Details Patient Name: Date of Service: [place], [name] 03/14/2023 9:00 A M Medical Record Number: [id number] Patient Account Number: [id number] Date of Birth/Sex: Treating RN: [birth year] (62 y.o. Tommye Standard Primary Care Jacksen Isip: Peri Maris Other Clinician: Referring Charmika Macdonnell: Treating Taydon Nasworthy/Extender: Deanna Artis in Treatment: 0 Encounter Discharge Information Items Post Procedure Vitals Discharge Condition: Stable Temperature (F): 98.2 Ambulatory Status: Wheelchair Pulse (bpm): 69 Discharge Destination: Home Respiratory Rate (breaths/min):  18 Transportation: Private Auto Blood Pressure (mmHg): 114/62 Accompanied By: spouse Schedule Follow-up Appointment: Yes Clinical Summary of Care: Patient Declined Electronic Signature(s) Signed: 03/14/2023 5:20:38 PM By: [name] RN, BSN Entered By: [name] on 03/14/2023 10:29:18 -------------------------------------------------------------------------------- Lower Extremity Assessment Details Patient Name: Date of Service: [place], [name]. 03/14/2023 9:00 A M Medical Record Number: [id number] Patient Account Number: [id number] Date of Birth/Sex: Treating RN: 1961-09-19 (61 y.o. Tommye Standard Primary Care Keana Dueitt: Peri Maris Other Clinician: Referring Ketzia Guzek: Treating Jeb Schloemer/Extender: Deanna Artis in Treatment: 0 Edema Assessment Assessed: Kyra Searles: No] [Right: No] [Left: Edema] [Right: :] Calf Left: Right: Point of Measurement: From Medial Instep 28.5 cm Ankle Left: Right: Point of Measurement: From Medial Instep 18.5 cm Vascular Assessment Pulses: Dorsalis Pedis Palpable: [Left:Yes] Blood Pressure: Brachial: [Left:114] Ankle: [Left:Dorsalis Pedis: 162 1.42] Electronic Signature(s) Signed: 03/14/2023 5:20:38 PM By: [name] RN, BSN Entered By: [name] on 03/14/2023 09:43:31 -------------------------------------------------------------------------------- Multi Wound Chart Details Patient Name: Date of Service: [name]. 03/14/2023 9:00 A M Medical Record Number: [id number] Patient Account Number: [id number] Date of Birth/Sex: Treating RN: 1961-01-09 (61 y.o. 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 Etiology: Extremity Cataracts, Asthma, Sleep Apnea, Comorbid History: Peripheral Venous Disease, Type I Diabetes, Osteomyelitis,  Neuropathy, Confinement Anxiety 02/28/2023 Date Acquired: 0 Weeks of Treatment: Open Wound Status: No Wound Recurrence: 0.2x0.3x0.1 Measurements L x W x D (cm) 0.047 A (cm) : rea 0.005 Volume (cm) : Grade 2 Classification: Small Exudate A mount:  Serosanguineous Exudate Type: red, brown Exudate Color: Well defined, not attached Wound Margin: Large (67-100%) Granulation A mount: Red Granulation Quality: Small (1-33%) Necrotic A mount: Fat Layer (Subcutaneous Tissue): Yes N/A Exposed Structures:  Fascia: No Tendon: No Muscle: No Joint: No Bone: No None Epithelialization: Debridement - Selective/Open Wound N/A Debridement: Pre-procedure Verification/Time Out 10:00 Taken: Lidocaine 4%  T opical Solution Pain Control: Callus, Slough Tissue Debrided:  Skin/Epidermis Level: 0.16 Debridement A (sq cm): rea Curette Instrument: Minimum Bleeding: Pressure Hemostasis A chieved: 0 Procedural Pain: 0 Post Procedural Pain: Procedure was tolerated well Debridement Treatment Response: 0.4x0.4x0.2 Post  Debridement Measurements L x W x D (cm) 0.025 Post Debridement Volume: (cm) Callus: Yes Periwound Skin Texture: No Abnormalities Noted Periwound Skin Moisture: No Abnormalities Noted Periwound Skin Color: No Abnormality Temperature: Debridement  Procedures Performed:] [N/A:N/A N/A N/A N/A N/A N/A N/A N/A N/A N/A N/A N/A N/A N/A N/A N/A N/A N/A N/A N/A N/A N/A N/A N/A N/A N/A N/A N/A N/A N/A N/A N/A N/A N/A N/A N/A N/A N/A N/A] Treatment Notes Electronic Signature(s) Signed: 03/14/2023 10:24:37 AM By: Duanne Guess MD FACS Entered By: Duanne Guess on  03/14/2023 10:24:37 -------------------------------------------------------------------------------- Multi-Disciplinary Care Plan Details Patient Name: Date of Service: [name]. 03/14/2023 9:00 A M Medical Record Number: [id number] Patient Account Number: [id number] Date of Birth/Sex: Treating RN: [birth year] (62 y.o. [address], [address] [place], Kite C ([id number]) [id number] Page 5 of 8 Primary Care Dossie Swor: Peri Maris Other Clinician: Referring Corrine Tillis: Treating Mattox Schorr/Extender: Deanna Artis in Treatment: 0 Multidisciplinary Care Plan reviewed with physician Active Inactive Abuse / Safety / Falls / Self Care Management Nursing Diagnoses: History of Falls Goals: Patient will not experience any injury related to falls Date Initiated: 03/14/2023 Target Resolution Date: 04/11/2023 Goal Status: Active Patient/caregiver will verbalize/demonstrate measures taken to prevent injury and/or falls Date Initiated: 03/14/2023 Target Resolution Date: 04/11/2023 Goal Status: Active Interventions: Assess fall risk on admission and as needed Assess impairment of mobility on admission and as needed per policy Notes: Nutrition Nursing Diagnoses: Impaired glucose control: actual or potential Potential for alteratiion in Nutrition/Potential for imbalanced nutrition Goals: Patient/caregiver will maintain therapeutic glucose control Date Initiated: 03/14/2023 Target Resolution Date: 04/11/2023 Goal Status: Active Interventions: Assess HgA1c results as ordered upon admission and as needed Provide education on elevated blood sugars and impact on wound healing Treatment Activities: Patient referred to Primary Care Physician for further nutritional evaluation : 03/14/2023 Notes: Wound/Skin Impairment Nursing Diagnoses: Impaired tissue integrity Knowledge deficit related to ulceration/compromised skin integrity Goals: Patient/caregiver will  verbalize understanding of skin care regimen Date Initiated: 03/14/2023 Target Resolution Date: 04/11/2023 Goal Status: Active Ulcer/skin breakdown will have a volume reduction of 30% by week 4 Date Initiated: 03/14/2023 Target Resolution Date: 04/11/2023 Goal Status: Active Interventions: Assess patient/caregiver ability to obtain necessary supplies Assess patient/caregiver ability to perform ulcer/skin care regimen upon admission and as needed Assess ulceration(s) every visit Provide education on ulcer and skin care Treatment Activities: Skin care regimen initiated : 03/14/2023 Topical wound management initiated : 03/14/2023 Notes: Electronic Signature(s) Signed: 03/14/2023 5:20:38 PM By: [name] RN, BSN Qazi, SignedSalena Saner ([id number]) PM By: [name] RN, BSN [facility] 03/14/2023 5:20:38 [phone].pdf Page 6 of 8 Entered By: [name] on 03/14/2023 10:05:13 -------------------------------------------------------------------------------- Pain Assessment Details Patient Name: Date of Service: Ariat[place], [place]ter [name] 03/14/2023 9:00 A M Medical Record Number: [id number] Patient Account Number: [id number] Date of Birth/Sex: Treating RN: 1961-06-22 (62 y.o. F) Primary Care Ithiel Liebler: Peri Maris Other Clinician: Referring Fay Swider: Treating Hoke Baer/Extender: Deanna Artis in Treatment: 0 Active Problems Location of Pain Severity and Description of Pain Patient Has Paino No Site Locations Rate the pain. Current Pain Level: 0 Pain Management and Medication Current Pain Management: Electronic Signature(s) Signed: 03/14/2023 5:20:38 PM By: [name] RN, BSN Entered By: [name] on 03/14/2023 09:49:44 --------------------------------------------------------------------------------  Patient/Caregiver Education Details Patient Name: Date of Service: Jerolene, Kupfer MA RY C. 4/15/2024andnbsp9:00 A M Medical Record Number:  [id number] Patient Account Number: [id number] Date of Birth/Gender: Treating RN: [birth year] (62 y.o. Tommye Standard Primary Care Physician: Peri Maris Other Clinician: Referring Physician: Treating Physician/Extender: Deanna Artis in Treatment: 0 Education Assessment Education Provided To: Patient Education Topics Provided Elevated Blood Sugar/ Impact on Healing: Methods: Explain/Verbal Responses: Reinforcements needed, State content correctly Offloading: Methods: Explain/Verbal Responses: Reinforcements needed, State content correctly Wound/Skin ImpairmentDAWNISHA, MARQUINA ([id number]) [id number] Page 7 of 8 Methods: Explain/Verbal Responses: Reinforcements needed, State content correctly Electronic Signature(s) Signed: 03/14/2023 5:20:38 PM By: [name] RN, BSN Entered By: [name] on 03/14/2023 10:05:37 -------------------------------------------------------------------------------- Wound Assessment Details Patient Name: Date of Service: [name]. 03/14/2023 9:00 A M Medical Record Number: [id number] Patient Account Number: [id number] Date of Birth/Sex: Treating RN: March 17, 1961 (61 y.o. Tommye Standard Primary Care Basil Buffin: Peri Maris Other Clinician: Referring Khamille Beynon: Treating Raju Coppolino/Extender: Deanna Artis in Treatment: 0 Wound Status Wound Number: 5 Primary Diabetic Wound/Ulcer of the Lower Extremity Etiology: Wound Location: Left, Lateral Foot Wound Open Wounding Event: Gradually Appeared Status: Date Acquired: 02/28/2023 Comorbid Cataracts, Asthma, Sleep Apnea, Peripheral Venous Disease, Type Weeks Of Treatment: 0 History: I Diabetes, Osteomyelitis, Neuropathy, Confinement Anxiety Clustered Wound: No Wound Measurements Length: (cm) 0.2 Width: (cm) 0.3 Depth: (cm) 0.1 Area: (cm) 0.047 Volume: (cm) 0.005 % Reduction in Area: % Reduction in  Volume: Epithelialization: None Tunneling: No Undermining: No Wound Description Classification: Grade 2 Wound Margin: Well defined, not attached Exudate Amount: Small Exudate Type: Serosanguineous Exudate Color: red, brown Foul Odor After Cleansing: No Slough/Fibrino No Wound Bed Granulation Amount: Large (67-100%) Exposed Structure Granulation Quality: Red Fascia Exposed: No Necrotic Amount: Small (1-33%) Fat Layer (Subcutaneous Tissue) Exposed: Yes Necrotic Quality: Adherent Slough Tendon Exposed: No Muscle Exposed: No Joint Exposed: No Bone Exposed: No Periwound Skin Texture Texture Color No Abnormalities Noted: No No Abnormalities Noted: Yes Callus: Yes Temperature / Pain Temperature: No Abnormality Moisture No Abnormalities Noted: Yes Treatment Notes Wound #5 (Foot) Wound Laterality: Left, Lateral Cleanser Peri-Wound Care Topical Primary Dressing Hydrofera Blue Ready Transfer Foam, 2.5x2.5 (in/in) Discharge Instruction: Apply directly to wound bed as directed Secondary Dressing WAJIHA, VERSTEEG ([id number]) [phone].pdf Page 8 of 8 Optifoam Non-Adhesive Dressing, 4x4 in Discharge Instruction: Apply over primary dressing cut to make foam donut Woven Gauze Sponges 2x2 in Discharge Instruction: Apply over primary dressing as directed. Secured With Conforming Stretch Gauze Bandage, Sterile 2x75 (in/in) Discharge Instruction: Secure with stretch gauze as directed. 72M Medipore H Soft Cloth Surgical T ape, 4 x 10 (in/yd) Discharge Instruction: Secure with tape as directed. Compression Wrap Compression Stockings Add-Ons Electronic Signature(s) Signed: 03/14/2023 5:20:38 PM By: [name] RN, BSN Entered By: [name] on 03/14/2023 09:49:19 -------------------------------------------------------------------------------- Vitals Details Patient Name: Date of Service: Kyung Rudd C. 03/14/2023 9:00 A M Medical Record Number:  [id number] Patient Account Number: [id number] Date of Birth/Sex: Treating RN: July 15, 1961 (61 y.o. F) Primary Care Sven Pinheiro: Peri Maris Other Clinician: Referring Gyanna Jarema: Treating Ellwood Steidle/Extender: Deanna Artis in Treatment: 0 Vital Signs Time Taken: 09:09 Temperature (F): 98.2 Height (in): 64 Pulse (bpm): 69 Weight (lbs): 170 Respiratory Rate (breaths/min): 18 Body Mass Index (BMI): 29.2 Blood Pressure (mmHg): 114/62 Capillary Blood Glucose (mg/dl): 542 Reference Range: 80 - 120 mg / dl Notes glucose per pt report this am Electronic Signature(s) Signed: 03/14/2023 5:20:38 PM By: [name] RN, BSN Entered  By: [name] on 03/14/2023 09:23:00

## 2023-03-15 NOTE — Progress Notes (Signed)
FLOR, WHITACRE ([id number]) [id number] Page 1 of 4 Visit Report for 03/14/2023 Abuse Risk Screen Details Patient Name: Date of Service: [name], [name] [name] 03/14/2023 9:00 A M Medical Record Number: [id number] Patient Account Number: [id number] Date of Birth/Sex: Treating RN: 23-Jul-1961 (62 y.o. Tommye Standard Primary Care Leory Allinson: Peri Maris Other Clinician: Referring Lowen Mansouri: Treating Maymunah Stegemann/Extender: Deanna Artis in Treatment: 0 Abuse Risk Screen Items Answer ABUSE RISK SCREEN: Has anyone close to you tried to hurt or harm you recentlyo No Do you feel uncomfortable with anyone in your familyo No Has anyone forced you do things that you didnt want to doo No Electronic Signature(s) Signed: 03/14/2023 5:20:38 PM By: [name] RN, BSN Entered By: [name] on 03/14/2023 09:34:11 -------------------------------------------------------------------------------- Activities of Daily Living Details Patient Name: Date of Service: TRENNA, KIELY 03/14/2023 9:00 A M Medical Record Number: [id number] Patient Account Number: [id number] Date of Birth/Sex: Treating RN: [birth year] (62 y.o. Tommye Standard Primary Care Zina Pitzer: Peri Maris Other Clinician: Referring Annlee Glandon: Treating Camilla Skeen/Extender: Deanna Artis in Treatment: 0 Activities of Daily Living Items Answer Activities of Daily Living (Please select one for each item) Drive Automobile Not Able T Medications ake Need Assistance Use T elephone Completely Able Care for Appearance Need Assistance Use T oilet Need Assistance Bath / Shower Need Assistance Dress Self Need Assistance Feed Self Completely Able Walk Need Assistance Get In / Out Bed Need Assistance Housework Need Assistance Prepare Meals Need Assistance Handle Money Completely Able Shop for Self Need Assistance Electronic Signature(s) Signed: 03/14/2023 5:20:38 PM  By: [name] RN, BSN Entered By: [name] on 03/14/2023 09:35:26 -------------------------------------------------------------------------------- Education Screening Details Patient Name: Date of Service: [name]. 03/14/2023 9:00 A M Medical Record Number: [id number] Patient Account Number: [id number] Date of Birth/Sex: Treating RN: 05/26/1961 (61 y.o. Tommye Standard Primary Care Arisbeth Purrington: Peri Maris Other Clinician: Referring Saafir Abdullah: Treating Zaylei Mullane/Extender: Deanna Artis in Treatment: [address] RHIANNAN, KIEVIT ([id number]) [phone].pdf Page 2 of 4 Primary Learner Assessed: Patient Learning Preferences/Education Level/Primary Language Learning Preference: Explanation, Demonstration, Printed Material Highest Education Level: College or Above Preferred Language: Economist Language Barrier: No Translator Needed: No Memory Deficit: No Emotional Barrier: No Cultural/Religious Beliefs Affecting Medical Care: No Physical Barrier Impaired Vision: Yes Glasses Impaired Hearing: No Decreased Hand dexterity: No Knowledge/Comprehension Knowledge Level: High Comprehension Level: High Ability to understand written instructions: High Ability to understand verbal instructions: High Motivation Anxiety Level: Calm Cooperation: Cooperative Education Importance: Acknowledges Need Interest in Health Problems: Asks Questions Perception: Coherent Willingness to Engage in Self-Management High Activities: Readiness to Engage in Self-Management High Activities: Electronic Signature(s) Signed: 03/14/2023 5:20:38 PM By: [name] RN, BSN Entered By: [name] on 03/14/2023 09:36:09 -------------------------------------------------------------------------------- Fall Risk Assessment Details Patient Name: Date of Service: Ilsa Iha, Jaquita Rector C. 03/14/2023 9:00 A M Medical Record Number:  [id number] Patient Account Number: [id number] Date of Birth/Sex: Treating RN: Apr 15, 1961 (62 y.o. Tommye Standard Primary Care Galaxy Borden: Peri Maris Other Clinician: Referring Domingo Fuson: Treating Karenann Mcgrory/Extender: Deanna Artis in Treatment: 0 Fall Risk Assessment Items Have you had 2 or more falls in the last 12 monthso 0 Yes Have you had any fall that resulted in injury in the last 12 monthso 0 No FALLS RISK SCREEN History of falling - immediate or within 3 months 25 Yes Secondary diagnosis (Do you have 2 or more medical diagnoseso) 0 No Ambulatory aid None/bed rest/wheelchair/nurse 0 No Crutches/cane/walker  15 Yes Furniture 0 No Intravenous therapy Access/Saline/Heparin Lock 0 No Gait/Transferring Normal/ bed rest/ wheelchair 0 No Weak (short steps with or without shuffle, stooped but able to lift head while walking, may seek 0 No support from furniture) Impaired (short steps with shuffle, may have difficulty arising from chair, head down, impaired 20 Yes balance) Mental Status Oriented to own ability 0 Yes Overestimates or forgets limitations 0 No Risk Level: High Risk Score: 60 Wren, Brentlee C ([id number]) [phone] [id number] Page 3 of 4 Electronic Signature(s) -------------------------------------------------------------------------------- Foot Assessment Details Patient Name: Date of Service: KIRCHOFF, [name] 03/14/2023 9:00 A M Medical Record Number: [id number] Patient Account Number: [id number] Date of Birth/Sex: Treating RN: [birth year] (62 y.o. 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 Site Locations + = Sensation present, - = Sensation absent, C = Callus, U = Ulcer R = Redness, W = Warmth, M =  Maceration, PU = Pre-ulcerative lesion F = Fissure, S = Swelling, D = Dryness Assessment Right: Left: Other Deformity: No Prior Foot Ulcer: No Prior Amputation: No Charcot Joint: Yes Ambulatory Status: Ambulatory With Help Assistance Device: Walker Gait: Surveyor, mining) Signed: 03/14/2023 5:20:38 PM By: [name] RN, BSN Entered By: [name] on 03/14/2023 09:39:41 -------------------------------------------------------------------------------- Nutrition Risk Screening Details Patient Name: Date of Service: Kyung Rudd C. 03/14/2023 9:00 A M Medical Record Number: [id number] Patient Account Number: [id number] Date of Birth/Sex: Treating RN: [birth year] (62 y.o. Tommye Standard Primary Care Murielle Stang: Peri Maris Other Clinician: Referring Carely Nappier: Treating Fortino Haag/Extender: Deanna Artis in Treatment: 0 Height (in): 64 Weight (lbs): 170 Body Mass Index (BMI): 29.2 DENYLA, CORTESE C ([id number]) [phone] [id number] Page 4 of 4 Nutrition Risk Screening Items Score Screening NUTRITION RISK SCREEN: I have an illness or condition that made me change the kind and/or amount of food I eat 2 Yes I eat fewer than two meals per day 0 No I eat few fruits and vegetables, or milk products 0 No I have three or more drinks of beer, liquor or wine almost every day 0 No I have tooth or mouth problems that make it hard for me to eat 0 No I don't always have enough money to buy the food I need 0 No I eat alone most of the time 0 No I take three or more different prescribed or over-the-counter drugs a day 1 Yes Without wanting to, I have lost or gained 10 pounds in the last six months 0 No I am not always physically able to shop, cook and/or feed myself 0 No Nutrition Protocols Good Risk Protocol Provide education on elevated blood Moderate Risk Protocol 0 sugars and impact on wound healing, as applicable High Risk  Proctocol Risk Level: Moderate Risk Score: 3 Electronic Signature(s) Signed: 03/14/2023 5:20:38 PM By: [name] RN, BSN Entered By: [name] on 03/14/2023 09:37:48

## 2023-03-23 ENCOUNTER — Encounter (HOSPITAL_BASED_OUTPATIENT_CLINIC_OR_DEPARTMENT_OTHER): Payer: Managed Care, Other (non HMO) | Admitting: General Surgery

## 2023-03-23 DIAGNOSIS — E10621 Type 1 diabetes mellitus with foot ulcer: Secondary | ICD-10-CM | POA: Diagnosis not present

## 2023-03-24 NOTE — Progress Notes (Addendum)
[name], [name] ([id number]) [id number] Page 1 of 10 Visit Report for 03/23/2023 Chief Complaint Document Details Patient Name: Date of Service: [name], [name] [name] 03/23/2023 2:00 PM Medical Record Number: [id number] Patient Account Number: [id number] Date of Birth/Sex: Treating RN: 08/10/1961 (62 y.o. F) Primary Care Provider: Peri Maris Other Clinician: Referring Provider: Treating Provider/Extender: Deanna Artis in Treatment: 1 Information Obtained from: Patient Chief Complaint Patients presents for treatment of an open diabetic ulcer of the left foot Electronic Signature(s) Signed: 03/23/2023 2:30:54 PM By: Duanne Guess MD FACS Entered By: Duanne Guess on 03/23/2023 14:30:54 -------------------------------------------------------------------------------- Debridement Details Patient Name: Date of Service: [name]. 03/23/2023 2:00 PM Medical Record Number: [id number] Patient Account Number: [id number] Date of Birth/Sex: Treating RN: [birth year] (61 y.o. [name] Primary Care Provider: Peri Maris Other Clinician: Referring Provider: Treating Provider/Extender: Deanna Artis in Treatment: 1 Debridement Performed for Assessment: Wound #5 Left,Lateral Foot Performed By: Physician Duanne Guess, MD Debridement Type: Debridement Severity of Tissue Pre Debridement: Fat layer exposed Level of Consciousness (Pre-procedure): Awake and Alert Pre-procedure Verification/Time Out Yes - 14:15 Taken: Start Time: 14:18 Percent of Wound Bed Debrided: 100% T Area Debrided (cm): otal 0.03 Tissue and other material debrided: Non-Viable, Callus, Slough, Slough Level: Non-Viable Tissue Debridement Description: Selective/Open Wound Instrument: Curette Bleeding: Minimum Hemostasis Achieved: Pressure Procedural Pain: 0 Post Procedural Pain: 0 Response to Treatment: Procedure was tolerated  well Level of Consciousness (Post- Awake and Alert procedure): Post Debridement Measurements of Total Wound Length: (cm) 0.2 Width: (cm) 0.2 Depth: (cm) 0.1 Volume: (cm) 0.003 Character of Wound/Ulcer Post Debridement: Improved Severity of Tissue Post Debridement: Fat layer exposed Post Procedure Diagnosis Same as Pre-procedure Notes scribed for Dr. Lady Gary by [name], RN Electronic Signature(s) Signed: 03/23/2023 2:34:18 PM By: Duanne Guess MD FACS [place], [place] ([id number]) [phone].pdf Page 2 of 10 Signed: 03/23/2023 4:40:27 PM By: [name] RN, BSN Entered By: [name] on 03/23/2023 14:21:28 -------------------------------------------------------------------------------- HPI Details Patient Name: Date of Service: [name], [name] Rector C. 03/23/2023 2:00 PM Medical Record Number: [id number] Patient Account Number: [id number] Date of Birth/Sex: Treating RN: Feb 18, 1961 (61 y.o. F) Primary Care Provider: Peri Maris Other Clinician: Referring Provider: Treating Provider/Extender: Deanna Artis in Treatment: 1 History of Present Illness HPI Description: ADMISSION 11/03/2020 this is a 62 year old woman with type 2 diabetes. She has severe peripheral neuropathy and autonomic neuropathy. 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 Her problem started at the beginning of July her husband noted a bleeding area in her sock. Unfortunately this became infected requiring admission to hospital from 7/12 through 06/13/2020. An MRI was negative for osteo she received IV antibiotics and was discharged on doxycycline. She did have [name] arterial ABIs and TBI's but I did not see any waveforms. Fortunately the ABIs and TBI's were normal at 1.09 and 0.69 on the right and 1.01 and 1.02 on the left respectively. Her last visit with Dr. Allena Katz of podiatry was on 07/04/2020. At 1 which time  the measurements were 1.1 x 0.8 x 0.3 she was using a dry dressing. She went on to [facility]ville [facility] at which time the wound was debrided and they were prescribed Santyl. The wound is actually on the lateral foot over the base of the fifth metatarsal. She had an additional wound on her right heel but this closed over. They are currently soaking the  foot in Epson salts and using Santyl and they have been doing this for quite a period of time. Past medical history includes type 1 diabetes managed with an insulin pump, movement disorder, hypothyroidism, obstructive sleep apnea. She has a cam boot and she wears this religiously spending most of her day currently in bed but she needs the boot on to get up to use her bedside commode ABIs were done during the hospitalization in July that showed the numbers quoted above 12/13; right lateral foot which is in the setting of an inverted ankle and likely to be this patient's full weightbearing surface. There is some eschar and slough around the wound circumference but in general the wound surface does not look too bad. She has new diabetic shoes and a brace for when this heals hopefully this will maintain skin integrity. Other than that I think she would need an attempt at surgery if a procedure is available to try and correct this weightbearing to form 12/20; right lateral foot in the setting of a severely inverted ankle. The patient is minimally ambulatory at this point. The areas over the right fifth metatarsal base. 12/01/2020; her right lateral foot wound looks a lot better however she comes in today with full de-epithelialized over the right second third and fourth toes with intense erythema and the erythema is spreading into the dorsal foot. Her husband is able to show me pictures which seems to start around Christmas day. She is insensate and therefore does not feel pain. She has not had any systemic symptoms including fever  chills. Her CBGs have been in the usual range for her. She has an allergy to penicillin based on a childhood issue but they tell me she has had ampicillin or amoxicillin since then and tolerated it well other than the fact that most antibiotics seem to cause her nausea 1/10; right lateral foot continues to improve. The area of cellulitis on the right dorsal foot and the erythema on the second third and fourth toes caused me to put her on empiric cephalexin last time. There was nothing to culture. T oday she comes in with erythema receded from the dorsal foot but still markedly erythematous second third and fourth toes. She has wounds especially between the third and fourth but also an area between the second and third. She also has what looks to be a paronychia a developing on the right medial first toe 1/18; her original wound on the right lateral foot looks about the same somewhat dry. While we are taking care of this she developed intense cellulitis involving the second third and fourth toes spreading into the forefoot. She had 10 days of cephalexin and things have generally improved quite a bit and the second and third toes dorsal foot but not the fourth toe. She has a substantial wound on the fourth toe medially with now exposed bone. We have been using silver alginate here. Hydrofera Blue to the lateral foot 1/25; her original wound on the right lateral foot at the fifth metatarsal base continues to contract this looks healthy. The subsequent wounds on her right fourth third and second toes continue to be problematic. The BONE SCRAPING PCR on the right fourth toe medial aspect last week showed MSSA and she is now on Bactrim I may continue this for at least a month. We are using silver alginate on the toes Hydrofera Blue on the right lateral foot 1/31 continues on Bactrim. The area on the right lateral foot at the  base of the fifth metatarsal is smaller still with some depth. But as long as this  is improving in terms of surface area no debridement. Using [facility] on this area. All of her wounds on her toes look less extensive calcium alginate as the primary dressing here 2/14; the area on the right lateral foot continues to be mostly closed over. Still some dry flaking skin and eschar. The only other wound I can identify is on the medial part of the right second toe there is some erythema here but I do not think there is infection we have been using silver alginate Her husband tells me he took the [facility] off because he thought things were too moist. 2/28; the patient's wound is closed on the right lateral foot at roughly the base of the fifth metatarsal. Also the area on the right second toe is closed. She has a severe ankle inversion history of Charcot foot. They tried to put her AFO brace on her over the weekend and it really did not work looking at her foot I am not surprised READMISSION 03/14/2023 Since her last admission to the wound care center, she has undergone right below-knee amputation. She is wearing an AFO on the left to address foot drop. She has developed an ulcer on the lateral aspect of her left foot, just distal to the ankle. There is thickening and callus buildup, suggesting that her orthotic may be rubbing in this area. The wound exposes the fat layer. There is no malodor or purulent drainage. It has been present for about 2 weeks and they have been treating it with some Hydrofera Blue that they had leftover from her previous admission. 03/23/2023: The wound has callused over. They did go to the orthotist and some adjustments have been made to her AFO. Electronic Signature(s) Signed: 03/23/2023 2:31:24 PM By: Duanne Guess MD FACS Entered By: Duanne Guess on 03/23/2023 14:31:23 [name] ([id number]) [id number] Page 3 of 10 -------------------------------------------------------------------------------- Physical Exam  Details Patient Name: Date of Service: [name], [name] 03/23/2023 2:00 PM Medical Record Number: [id number] Patient Account Number: [id number] Date of Birth/Sex: Treating RN: [birth year] (62 y.o. F) Primary Care Provider: Peri Maris Other Clinician: Referring Provider: Treating Provider/Extender: Deanna Artis in Treatment: 1 Constitutional . . . . no acute distress. Respiratory Normal work of breathing on room air. Notes 03/23/2023: The wound has callused over. Underneath the callus, there is a small open area with some slough accumulation. Electronic Signature(s) Signed: 03/23/2023 2:32:31 PM By: Duanne Guess MD FACS Entered By: Duanne Guess on 03/23/2023 14:32:31 -------------------------------------------------------------------------------- Physician Orders Details Patient Name: Date of Service: [name] 03/23/2023 2:00 PM Medical Record Number: [id number] Patient Account Number: [id number] Date of Birth/Sex: Treating RN: 1961-09-04 (61 y.o. [name] Primary Care Provider: Peri Maris Other Clinician: Referring Provider: Treating Provider/Extender: Deanna Artis in Treatment: 1 Verbal / Phone Orders: No Diagnosis Coding ICD-10 Coding Code Description [phone] Non-pressure chronic ulcer of other part of left foot with fat layer exposed E10.621 Type 1 diabetes mellitus with foot ulcer E10.43 Type 1 diabetes mellitus with diabetic autonomic (poly)neuropathy N18.31 Chronic kidney disease, stage 3a Z89.511 Acquired absence of right leg below knee Follow-up Appointments ppointment in 1 week. - Dr. Lady Gary RM 1 Return A Wed 5/1 @ 12:30 pm Anesthetic Wound #5 Left,Lateral Foot (In clinic) Topical Lidocaine 4% applied to wound bed Bathing/ Shower/ Hygiene May shower and wash wound with soap and water. Off-Loading  Other: - foam donut Wound Treatment Wound #5 - Foot Wound Laterality: Left, Lateral Prim  Dressing: Promogran Prisma Matrix, 4.34 (sq in) (silver collagen) Every Other Day/30 Days ary Discharge Instructions: Moisten collagen with saline or hydrogel Secondary Dressing: Optifoam Non-Adhesive Dressing, 4x4 in Every Other Day/30 Days Discharge Instructions: Apply over primary dressing cut to make foam donut Secondary Dressing: Woven Gauze Sponges 2x2 in Every Other Day/30 Days Discharge Instructions: Apply over primary dressing as directed. [name], [name] ([id number]) [id number] Page 4 of 10 Secured With: Insurance underwriter, Sterile 2x75 (in/in) Every Other Day/30 Days Discharge Instructions: Secure with stretch gauze as directed. Secured With: 25M Medipore H Soft Cloth Surgical T ape, 4 x 10 (in/yd) Every Other Day/30 Days Discharge Instructions: Secure with tape as directed. Electronic Signature(s) Signed: 03/23/2023 2:34:18 PM By: Duanne Guess MD FACS Entered By: Duanne Guess on 03/23/2023 14:32:41 -------------------------------------------------------------------------------- Problem List Details Patient Name: Date of Service: [name]. 03/23/2023 2:00 PM Medical Record Number: [id number] Patient Account Number: [id number] Date of Birth/Sex: Treating RN: 1961-06-02 (61 y.o. [name] Primary Care Provider: Peri Maris Other Clinician: Referring Provider: Treating Provider/Extender: Deanna Artis in Treatment: 1 Active Problems ICD-10 Encounter Code Description Active Date MDM Diagnosis [phone] Non-pressure chronic ulcer of other part of left foot with fat layer exposed 03/14/2023 No Yes E10.621 Type 1 diabetes mellitus with foot ulcer 03/14/2023 No Yes E10.43 Type 1 diabetes mellitus with diabetic autonomic (poly)neuropathy 03/14/2023 No Yes N18.31 Chronic kidney disease, stage 3a 03/14/2023 No Yes Z89.511 Acquired absence of right leg below knee 03/14/2023 No Yes Inactive  Problems Resolved Problems Electronic Signature(s) Signed: 03/23/2023 2:30:43 PM By: Duanne Guess MD FACS Previous Signature: 03/23/2023 2:27:42 PM Version By: Duanne Guess MD FACS Entered By: Duanne Guess on 03/23/2023 14:30:43 -------------------------------------------------------------------------------- Progress Note Details Patient Name: Date of Service: [name]. 03/23/2023 2:00 PM Medical Record Number: [id number] Patient Account Number: [id number] Date of Birth/Sex: Treating RN: 1961/09/20 (61 y.o. F) Primary Care Provider: Peri Maris Other Clinician: Referring Provider: Treating Provider/Extender: Deanna Artis in Treatment: 1 Subjective [name], [name] ([id number]) [id number] Page 5 of 10 Chief Complaint Information obtained from Patient Patients presents for treatment of an open diabetic ulcer of the left foot History of Present Illness (HPI) ADMISSION 11/03/2020 this is a 62 year old woman with type 2 diabetes. She has severe peripheral neuropathy and autonomic neuropathy. 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 Her problem started at the beginning of July her husband noted a bleeding area in her sock. Unfortunately this became infected requiring admission to hospital from 7/12 through 06/13/2020. An MRI was negative for osteo she received IV antibiotics and was discharged on doxycycline. She did have [name] arterial ABIs and TBI's but I did not see any waveforms. Fortunately the ABIs and TBI's were normal at 1.09 and 0.69 on the right and 1.01 and 1.02 on the left respectively. Her last visit with Dr. Allena Katz of podiatry was on 07/04/2020. At 1 which time the measurements were 1.1 x 0.8 x 0.3 she was using a dry dressing. She went on to [facility] at which time the wound was debrided and they were prescribed  Santyl. The wound is actually on the lateral foot over the base of the fifth metatarsal. She had an additional wound on her right heel but this closed over. They are currently soaking  the foot in Epson salts and using Santyl and they have been doing this for quite a period of time. Past medical history includes type 1 diabetes managed with an insulin pump, movement disorder, hypothyroidism, obstructive sleep apnea. She has a cam boot and she wears this religiously spending most of her day currently in bed but she needs the boot on to get up to use her bedside commode ABIs were done during the hospitalization in July that showed the numbers quoted above 12/13; right lateral foot which is in the setting of an inverted ankle and likely to be this patient's full weightbearing surface. There is some eschar and slough around the wound circumference but in general the wound surface does not look too bad. She has new diabetic shoes and a brace for when this heals hopefully this will maintain skin integrity. Other than that I think she would need an attempt at surgery if a procedure is available to try and correct this weightbearing to form 12/20; right lateral foot in the setting of a severely inverted ankle. The patient is minimally ambulatory at this point. The areas over the right fifth metatarsal base. 12/01/2020; her right lateral foot wound looks a lot better however she comes in today with full de-epithelialized over the right second third and fourth toes with intense erythema and the erythema is spreading into the dorsal foot. Her husband is able to show me pictures which seems to start around Christmas day. She is insensate and therefore does not feel pain. She has not had any systemic symptoms including fever chills. Her CBGs have been in the usual range for her. She has an allergy to penicillin based on a childhood issue but they tell me she has had ampicillin or amoxicillin since then and tolerated  it well other than the fact that most antibiotics seem to cause her nausea 1/10; right lateral foot continues to improve. ooThe area of cellulitis on the right dorsal foot and the erythema on the second third and fourth toes caused me to put her on empiric cephalexin last time. There was nothing to culture. T oday she comes in with erythema receded from the dorsal foot but still markedly erythematous second third and fourth toes. She has wounds especially between the third and fourth but also an area between the second and third. She also has what looks to be a paronychia a developing on the right medial first toe 1/18; her original wound on the right lateral foot looks about the same somewhat dry. While we are taking care of this she developed intense cellulitis involving the second third and fourth toes spreading into the forefoot. She had 10 days of cephalexin and things have generally improved quite a bit and the second and third toes dorsal foot but not the fourth toe. She has a substantial wound on the fourth toe medially with now exposed bone. We have been using silver alginate here. Hydrofera Blue to the lateral foot 1/25; her original wound on the right lateral foot at the fifth metatarsal base continues to contract this looks healthy. The subsequent wounds on her right fourth third and second toes continue to be problematic. The BONE SCRAPING PCR on the right fourth toe medial aspect last week showed MSSA and she is now on Bactrim I may continue this for at least a month. We are using silver alginate on the toes Hydrofera Blue on the right lateral foot 1/31 continues on Bactrim. The area on the right lateral foot at  the base of the fifth metatarsal is smaller still with some depth. But as long as this is improving in terms of surface area no debridement. Using [facility] on this area. All of her wounds on her toes look less extensive calcium alginate as the primary dressing here 2/14;  the area on the right lateral foot continues to be mostly closed over. Still some dry flaking skin and eschar. The only other wound I can identify is on the medial part of the right second toe there is some erythema here but I do not think there is infection we have been using silver alginate Her husband tells me he took the [facility] off because he thought things were too moist. 2/28; the patient's wound is closed on the right lateral foot at roughly the base of the fifth metatarsal. Also the area on the right second toe is closed. She has a severe ankle inversion history of Charcot foot. They tried to put her AFO brace on her over the weekend and it really did not work looking at her foot I am not surprised READMISSION 03/14/2023 Since her last admission to the wound care center, she has undergone right below-knee amputation. She is wearing an AFO on the left to address foot drop. She has developed an ulcer on the lateral aspect of her left foot, just distal to the ankle. There is thickening and callus buildup, suggesting that her orthotic may be rubbing in this area. The wound exposes the fat layer. There is no malodor or purulent drainage. It has been present for about 2 weeks and they have been treating it with some Hydrofera Blue that they had leftover from her previous admission. 03/23/2023: The wound has callused over. They did go to the orthotist and some adjustments have been made to her AFO. Patient History Information obtained from Patient. Family History Cancer - Father, Diabetes - Father,Maternal Grandparents, Hypertension - Mother, Lung Disease - Father, Thyroid Problems - Mother, No family history of Heart Disease, Hereditary Spherocytosis, Kidney Disease, Seizures, Stroke, Tuberculosis. Social History Never smoker, Marital Status - Married, Alcohol Use - Never, Drug Use - No History, Caffeine Use - Rarely. Medical History Eyes Patient has history of Cataracts - bil  extractions Denies history of Glaucoma, Optic Neuritis Ear/Nose/Mouth/Throat Denies history of Chronic sinus problems/congestion, Middle ear problems Hematologic/Lymphatic Denies history of Anemia, Hemophilia, Human Immunodeficiency Virus, Lymphedema, Sickle Cell Disease Respiratory Patient has history of Asthma, Sleep Apnea Denies history of Aspiration, Chronic Obstructive Pulmonary Disease (COPD), Pneumothorax, Tuberculosis Cardiovascular Patient has history of Peripheral Venous Disease [name], [name] ([id number]) [id number] Page 6 of 10 Denies history of Angina, Arrhythmia, Congestive Heart Failure, Coronary Artery Disease, Deep Vein Thrombosis, Hypertension, Hypotension, Myocardial Infarction, Peripheral Arterial Disease, Phlebitis, Vasculitis Gastrointestinal Denies history of Cirrhosis , Colitis, Crohnoos, Hepatitis A, Hepatitis B, Hepatitis C Endocrine Patient has history of Type I Diabetes Denies history of Type II Diabetes Genitourinary Denies history of End Stage Renal Disease Immunological Denies history of Lupus Erythematosus, Raynaudoos, Scleroderma Integumentary (Skin) Denies history of History of Burn Musculoskeletal Patient has history of Osteomyelitis - hx right foot Denies history of Gout, Rheumatoid Arthritis, Osteoarthritis Neurologic Patient has history of Neuropathy Denies history of Dementia, Quadriplegia, Paraplegia, Seizure Disorder Oncologic Denies history of Received Chemotherapy, Received Radiation Psychiatric Patient has history of Confinement Anxiety Denies history of Anorexia/bulimia Hospitalization/Surgery History - right BKA. - esophageal balloon dilitation. - eye surgery. - trigger finger release. - bil cataract extraction. - abdominal hysterectomy. - carparl tunnel  release right. - tonsillectomy. - appendectomy. Medical A Surgical History Notes nd Eyes diabetic  retinopathy Cardiovascular hypotension Gastrointestinal gastroporesis, GERD, esophageal stricture Genitourinary CKD stage 3 Objective Constitutional no acute distress. Vitals Time Taken: 2:08 PM, Height: 64 in, Weight: 170 lbs, BMI: 29.2, Temperature: 98.1 F, Pulse: 69 bpm, Respiratory Rate: 18 breaths/min, Blood Pressure: 136/82 mmHg, Capillary Blood Glucose: 126 mg/dl. General Notes: glucose per pt's meter at present Respiratory Normal work of breathing on room air. General Notes: 03/23/2023: The wound has callused over. Underneath the callus, there is a small open area with some slough accumulation. Integumentary (Hair, Skin) Wound #5 status is Open. Original cause of wound was Gradually Appeared. The date acquired was: 02/28/2023. The wound has been in treatment 1 weeks. The wound is located on the Left,Lateral Foot. The wound measures 0cm length x 0cm width x 0cm depth; 0cm^2 area and 0cm^3 volume. There is no tunneling or undermining noted. There is a none present amount of drainage noted. There is no granulation within the wound bed. There is no necrotic tissue within the wound bed. The periwound skin appearance had no abnormalities noted for moisture. The periwound skin appearance had no abnormalities noted for color. The periwound skin appearance exhibited: Callus. Periwound temperature was noted as No Abnormality. 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  There was a Selective/Open Wound Non-Viable Tissue Debridement with a total area of 0.03 sq cm performed by Duanne Guess, MD. With the following instrument(s): Curette to remove Non-Viable tissue/material. Material removed includes Callus and Slough and. No specimens were taken. A time out was conducted at 14:15, prior to the start of the procedure. A Minimum amount of bleeding was controlled with Pressure. The procedure was tolerated well with a pain level of 0 throughout and a pain level of 0 following the procedure. Post Debridement Measurements: 0.2cm length x 0.2cm width x 0.1cm depth; 0.003cm^3 volume. Character of Wound/Ulcer Post Debridement is improved. Severity of Tissue Post Debridement is: Fat layer exposed. Post procedure Diagnosis Wound #5: Same as Pre-Procedure General Notes: scribed for Dr. Lady Gary by [name], RN. Plan Follow-up Appointments: Return Appointment in 1 week. - Dr. Lady Gary RM 1 Wed 5/1 @ 12:30 pm Anesthetic: Wound #5 Left,Lateral Foot: (In clinic) Topical Lidocaine 4% applied to wound bed Bathing/ Shower/ Hygiene: May shower and wash wound with soap and water. Off-Loading: Other: - foam donut WOUND #5: - Foot Wound Laterality: Left, Lateral Prim Dressing: Promogran Prisma Matrix, 4.34 (sq in) (silver collagen) Every Other Day/30 Days ary Discharge Instructions: Moisten collagen with saline or hydrogel Secondary Dressing: Optifoam Non-Adhesive Dressing, 4x4 in Every Other Day/30 Days Discharge Instructions: Apply over primary dressing cut to make foam donut Secondary Dressing: Woven Gauze Sponges 2x2 in Every Other Day/30 Days Discharge Instructions: Apply over primary dressing as directed. Secured With: Insurance underwriter, Sterile 2x75 (in/in) Every Other Day/30 Days Discharge Instructions: Secure with stretch gauze as directed. Secured With: 62M Medipore H Soft Cloth Surgical T ape, 4 x 10 (in/yd) Every Other Day/30 Days Discharge  Instructions: Secure with  tape as directed. 03/23/2023: The wound has callused over. Underneath the callus, there is a small open area with some slough accumulation. I used a curette to debride callus and slough from the wound. I think collagen would be beneficial in trying to get the open portion to epithelialize, so I am going to change to that and discontinue Hydrofera Blue. Hopefully the adjustments that have been made to her AFO will eliminate the pressure and rubbing in this location. Follow-up in 1 week. Electronic Signature(s) Signed: 03/24/2023 4:51:53 PM By: Shawn Stall RN, BSN Signed: 03/25/2023 7:52:44 AM By: Duanne Guess MD FACS Previous Signature: 03/23/2023 2:33:32 PM Version By: Duanne Guess MD FACS Entered By: Shawn Stall on 03/24/2023 16:49:25 -------------------------------------------------------------------------------- HxROS Details Patient Name: Date of Service: [name], [name] C. 03/23/2023 2:00 PM Medical Record Number: [id number] Patient Account Number: [id number] Date of Birth/Sex: Treating RN: [birth year] (61 y.o. F) Primary Care Provider: Peri Maris Other Clinician: Referring Provider: Treating Provider/Extender: Deanna Artis in Treatment: 1 Information Obtained From Patient Eyes Medical History: Positive for: Cataracts - bil extractions Negative for: Glaucoma; Optic Neuritis Past Medical History Notes: diabetic retinopathy Ear/Nose/Mouth/Throat [name], [name] ([id number]) [id number] Page 8 of 10 Medical History: Negative for: Chronic sinus problems/congestion; Middle ear problems Hematologic/Lymphatic Medical History: Negative for: Anemia; Hemophilia; Human Immunodeficiency Virus; Lymphedema; Sickle Cell Disease Respiratory Medical History: Positive for: Asthma; Sleep Apnea Negative for: Aspiration; Chronic Obstructive Pulmonary Disease (COPD); Pneumothorax;  Tuberculosis Cardiovascular Medical History: Positive for: Peripheral Venous Disease Negative for: Angina; Arrhythmia; Congestive Heart Failure; Coronary Artery Disease; Deep Vein Thrombosis; Hypertension; Hypotension; Myocardial Infarction; Peripheral Arterial Disease; Phlebitis; Vasculitis Past Medical History Notes: hypotension Gastrointestinal Medical History: Negative for: Cirrhosis ; Colitis; Crohns; Hepatitis A; Hepatitis B; Hepatitis C Past Medical History Notes: gastroporesis, GERD, esophageal stricture Endocrine Medical History: Positive for: Type I Diabetes Negative for: Type II Diabetes Time with diabetes: 30 Treated with: Insulin Blood sugar tested every day: Yes Tested : dexcom Genitourinary Medical History: Negative for: End Stage Renal Disease Past Medical History Notes: CKD stage 3 Immunological Medical History: Negative for: Lupus Erythematosus; Raynauds; Scleroderma Integumentary (Skin) Medical History: Negative for: History of Burn Musculoskeletal Medical History: Positive for: Osteomyelitis - hx right foot Negative for: Gout; Rheumatoid Arthritis; Osteoarthritis Neurologic Medical History: Positive for: Neuropathy Negative for: Dementia; Quadriplegia; Paraplegia; Seizure Disorder Oncologic Medical History: Negative for: Received Chemotherapy; Received Radiation Psychiatric Medical History: Positive for: Confinement Anxiety Negative for: TAELYNN, MCELHANNON ([id number]) [phone].pdf Page 9 of 10 HBO Extended History Items Eyes: Cataracts Immunizations Pneumococcal Vaccine: Received Pneumococcal Vaccination: Yes Received Pneumococcal Vaccination On or After 60th Birthday: No Implantable Devices None Hospitalization / Surgery History Type of Hospitalization/Surgery right BKA esophageal balloon dilitation eye surgery trigger finger release bil cataract extraction abdominal hysterectomy [place]  tunnel release right tonsillectomy appendectomy Family and Social History Cancer: Yes - Father; Diabetes: Yes - Father,Maternal Grandparents; Heart Disease: No; Hereditary Spherocytosis: No; Hypertension: Yes - Mother; Kidney Disease: No; Lung Disease: Yes - Father; Seizures: No; Stroke: No; Thyroid Problems: Yes - Mother; Tuberculosis: No; Never smoker; Marital Status - Married; Alcohol Use: Never; Drug Use: No History; Caffeine Use: Rarely; Financial Concerns: No; Food, Clothing or Shelter Needs: No; Support System Lacking: No; Transportation Concerns: No Psychologist, prison and probation services) Signed: 03/23/2023 2:34:18 PM By: Duanne Guess MD FACS Entered By: Duanne Guess on 03/23/2023 14:31:33 -------------------------------------------------------------------------------- SuperBill Details Patient Name: Date of Service: [name] 03/23/2023 Medical Record Number: [id number] Patient Account Number: [id number] Date of Birth/Sex: Treating RN:  [birth year] (61 y.o. 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 N18.31 Chronic kidney disease, stage 3a Z89.511 Acquired absence of right leg below knee Facility Procedures : CPT4 Code: [id number] Description: 97597 - DEBRIDE WOUND 1ST 20 SQ CM OR < ICD-10 Diagnosis Description L97.522 Non-pressure chronic ulcer of other part of left foot with fat layer exposed Modifier: Quantity: 1 Physician Procedures : CPT4 Code Description Modifier [id number] 99213 - WC PHYS LEVEL 3 - EST PT 25 ICD-10 Diagnosis Description L97.522 Non-pressure chronic ulcer of other part of left foot with fat layer  exposed E10.621 Type 1 diabetes mellitus with foot ulcer E10.43 Type 1  diabetes mellitus with diabetic autonomic (poly)neuropathy SHAWNAY, BRAMEL ([id number]) [id number] N18.31 Chronic kidney disease, stage 3a Quantity: 1 7.pdf Page 10 of 10 : [id number] 97597 - WC PHYS DEBR WO ANESTH 20 SQ CM 1 ICD-10 Diagnosis Description L97.522 Non-pressure chronic ulcer of other part of left foot with fat layer exposed Quantity: Electronic Signature(s) Signed: 03/23/2023 2:33:50 PM By: Duanne Guess MD FACS Entered By: Duanne Guess on 03/23/2023 14:33:49

## 2023-03-24 NOTE — Progress Notes (Signed)
[name] ([id number]) [id number] Page 1 of 7 Visit Report for 03/23/2023 Arrival Information Details Patient Name: Date of Service: [name], [name] 03/23/2023 2:00 PM Medical Record Number: [id number] Patient Account Number: [id number] Date of Birth/Sex: Treating RN: 04-Aug-1961 (62 y.o. [name]: Peri Maris Other Clinician: Referring Ivannah Zody: Treating Lonia Roane/Extender: Deanna Artis in Treatment: 1 Visit Information History Since Last Visit Added or deleted any medications: No Patient Arrived: Wheel Chair Any new allergies or adverse reactions: No Arrival Time: 14:03 Had a fall or experienced change in No Accompanied By: spouse activities of daily living that may affect Transfer Assistance: Manual risk of falls: Patient Identification Verified: Yes Signs or symptoms of abuse/neglect since last visito No Secondary Verification Process Completed: Yes Hospitalized since last visit: No Patient Requires Transmission-Based Precautions: No Implantable device outside of the clinic excluding No Patient Has Alerts: No cellular tissue based products placed in the center since last visit: Has Dressing in Place as Prescribed: Yes Pain Present Now: No Electronic Signature(s) Signed: 03/23/2023 4:40:27 PM By: [name] RN, BSN Entered By: [name] on 03/23/2023 14:07:51 -------------------------------------------------------------------------------- Encounter Discharge Information Details Patient Name: Date of Service: [name]. 03/23/2023 2:00 PM Medical Record Number: [id number] Patient Account Number: [id number] Date of Birth/Sex: Treating RN: [birth year] (61 y.o. [name]: Peri Maris Other Clinician: Referring Klover Priestly: Treating Megan Hayduk/Extender: Deanna Artis in Treatment: 1 Encounter Discharge Information Items Post  Procedure Vitals Discharge Condition: Stable Temperature (F): 98.1 Ambulatory Status: Wheelchair Pulse (bpm): 69 Discharge Destination: Home Respiratory Rate (breaths/min): 18 Transportation: Private Auto Blood Pressure (mmHg): 136/82 Accompanied By: spouse Schedule Follow-up Appointment: Yes Clinical Summary of Care: Patient Declined Electronic Signature(s) Signed: 03/23/2023 4:40:27 PM By: [name] RN, BSN Entered By: [name] on 03/23/2023 14:39:23 -------------------------------------------------------------------------------- Lower Extremity Assessment Details Patient Name: Date of Service: [name]. 03/23/2023 2:00 PM Medical Record Number: [id number] Patient Account Number: [id number] Date of Birth/Sex: Treating RN: 1961-01-03 (61 y.o. [name] Standard Primary Care Shadiamond Koska: Peri Maris Other Clinician: Referring Christyne Mccain: Treating Zlata Alcaide/Extender: Deanna Artis in Treatment: 1 Edema Assessment Assessed: Kyra Searles: No] [Right: No] S[LeftDYANA, MAGNER ([id number])] [Right: [id number] Page 2 of 7] Edema: [Left: Ye] [Right: s] Calf Left: Right: Point of Measurement: From Medial Instep 28.5 cm Ankle Left: Right: Point of Measurement: From Medial Instep 19.5 cm Vascular Assessment Pulses: Dorsalis Pedis Palpable: [Left:Yes] Electronic Signature(s) Signed: 03/23/2023 4:40:27 PM By: [name] RN, BSN Entered By: [name] on 03/23/2023 14:11:33 -------------------------------------------------------------------------------- Multi Wound Chart Details Patient Name: Date of Service: [name]. 03/23/2023 2:00 PM Medical Record Number: [id number] Patient Account Number: [id number] Date of Birth/Sex: Treating RN: March 18, 1961 (61 y.o. F) Primary Care Clora Ohmer: Peri Maris Other Clinician: Referring Zaila Crew: Treating Phenix Grein/Extender: Deanna Artis in Treatment:  1 Vital Signs Height(in): 64 Capillary Blood Glucose(mg/dl): 742 Weight(lbs): 595 Pulse(bpm): 69 Body Mass Index(BMI): 29.2 Blood Pressure(mmHg): 136/82 Temperature(F): 98.1 Respiratory Rate(breaths/min): 18 [5:Photos: No Photos Left, Lateral Foot Wound Location: Gradually Appeared Wounding Event: Diabetic Wound/Ulcer of the Lower Primary Etiology: Extremity Cataracts, Asthma, Sleep Apnea, Comorbid History: Peripheral Venous Disease, Type I Diabetes, Osteomyelitis,  Neuropathy, Confinement Anxiety 02/28/2023 Date Acquired: 1 Weeks of Treatment: Open Wound Status: No Wound Recurrence: 0x0x0 Measurements L x W x D (cm) 0 A (cm) : rea 0 Volume (cm) : 100.00% % Reduction in A rea: 100.00% % Reduction in Volume: Grade 2  Classification: None Present Exudate A mount: None Present (0%) Granulation A mount: None Present (0%) Necrotic A mount: Fascia: No Exposed Structures: Fat Layer (Subcutaneous Tissue): No Tendon: No Muscle: No Joint: No Bone: No Large (67-100%)  Epithelialization: Debridement - Selective/Open Wound Debridement: Pre-procedure Verification/Time Out 14:15 Taken: Callus, Slough Tissue Debrided: Non-Viable Tissue Level: 0.03 Debridement A (sq cm): rea Curette Instrument:] [N/A:N/A N/A N/A N/A N/A N/A  N/A N/A N/A N/A N/A N/A N/A N/A N/A N/A N/A N/A N/A N/A N/A N/A N/A N/A N/A N/A] DARYN, [name] ([id number]) [5:Minimum Bleeding: Pressure Hemostasis A chieved: 0 Procedural Pain: 0 Post Procedural Pain: Procedure was tolerated well Debridement Treatment Response: 0.2x0.2x0.1 Post Debridement Measurements L x W x D (cm) 0.003 Post Debridement Volume: (cm)  Callus: Yes Periwound Skin Texture: No Abnormalities Noted Periwound Skin Moisture: No Abnormalities Noted Periwound Skin Color: No Abnormality Temperature: Debridement Procedures Performed:] [N/A:N/A N/A N/A N/A N/A N/A N/A N/A N/A N/A N/A N/A] Treatment Notes Electronic Signature(s) Signed: 03/23/2023 2:30:47 PM By: Duanne Guess MD  FACS Entered By: Duanne Guess on 03/23/2023 14:30:47 -------------------------------------------------------------------------------- Multi-Disciplinary Care Plan Details Patient Name: Date of Service: [name]. 03/23/2023 2:00 PM Medical Record Number: [id number] Patient Account Number: [id number] Date of Birth/Sex: Treating RN: [birth year] (61 y.o. [name] Standard Primary Care Willow Reczek: Peri Maris Other Clinician: Referring Pierre Cumpton: Treating Tanyla Stege/Extender: Deanna Artis in Treatment: 1 Multidisciplinary Care Plan reviewed with physician Active Inactive Abuse / Safety / Falls / Self Care Management Nursing Diagnoses: History of Falls Goals: Patient will not experience any injury related to falls Date Initiated: 03/14/2023 Target Resolution Date: 04/11/2023 Goal Status: Active Patient/caregiver will verbalize/demonstrate measures taken to prevent injury and/or falls Date Initiated: 03/14/2023 Target Resolution Date: 04/11/2023 Goal Status: Active Interventions: Assess fall risk on admission and as needed Assess impairment of mobility on admission and as needed per policy Notes: Nutrition Nursing Diagnoses: Impaired glucose control: actual or potential Potential for alteratiion in Nutrition/Potential for imbalanced nutrition Goals: Patient/caregiver will maintain therapeutic glucose control Date Initiated: 03/14/2023 Target Resolution Date: 04/11/2023 Goal Status: Active Interventions: Assess HgA1c results as ordered upon admission and as needed Provide education on elevated blood sugars and impact on wound healing Treatment Activities: Patient referred to Primary Care Physician for further nutritional evaluation : 03/14/2023 Notes: KEARSTEN, GINTHER ([id number]) [phone].pdf Page 4 of 7 Wound/Skin Impairment Nursing Diagnoses: Impaired tissue integrity Knowledge deficit related to ulceration/compromised skin  integrity Goals: Patient/caregiver will verbalize understanding of skin care regimen Date Initiated: 03/14/2023 Target Resolution Date: 04/11/2023 Goal Status: Active Ulcer/skin breakdown will have a volume reduction of 30% by week 4 Date Initiated: 03/14/2023 Target Resolution Date: 04/11/2023 Goal Status: Active Interventions: Assess patient/caregiver ability to obtain necessary supplies Assess patient/caregiver ability to perform ulcer/skin care regimen upon admission and as needed Assess ulceration(s) every visit Provide education on ulcer and skin care Treatment Activities: Skin care regimen initiated : 03/14/2023 Topical wound management initiated : 03/14/2023 Notes: Electronic Signature(s) Signed: 03/23/2023 4:40:27 PM By: [name] RN, BSN Entered By: [name] on 03/23/2023 14:13:41 -------------------------------------------------------------------------------- Pain Assessment Details Patient Name: Date of Service: [name]. 03/23/2023 2:00 PM Medical Record Number: [id number] Patient Account Number: [id number] Date of Birth/Sex: Treating RN: 1961-06-27 (61 y.o. [name] Standard Primary Care Tenita Cue: Peri Maris Other Clinician: Referring Demaris Leavell: Treating Elorah Dewing/Extender: Deanna Artis in Treatment: 1 Active Problems Location of Pain Severity and Description of Pain Patient Has Paino No Site Locations Rate the pain. Current  Pain Level: 0 Pain Management and Medication Current Pain Management: Electronic Signature(s) Signed: 03/23/2023 4:40:27 PM By: [name] RN, BSN Entered By: [name] on 03/23/2023 14:09:11 [name] ([id number]) [id number] Page 5 of 7 -------------------------------------------------------------------------------- Patient/Caregiver Education Details Patient Name: Date of Service: Genoveva, [name] 4/24/2024andnbsp2:00 PM Medical Record Number:  [id number] Patient Account Number: [id number] Date of Birth/Gender: Treating RN: 04/10/1961 (62 y.o. [name] Standard Primary Care Physician: Peri Maris Other Clinician: Referring Physician: Treating Physician/Extender: Deanna Artis in Treatment: 1 Education Assessment Education Provided To: Patient Education Topics Provided Offloading: Methods: Explain/Verbal Responses: Reinforcements needed, State content correctly Wound/Skin Impairment: Methods: Explain/Verbal Responses: Reinforcements needed, State content correctly Electronic Signature(s) Signed: 03/23/2023 4:40:27 PM By: [name] RN, BSN Entered By: [name] on 03/23/2023 14:14:29 -------------------------------------------------------------------------------- Wound Assessment Details Patient Name: Date of Service: [name]. 03/23/2023 2:00 PM Medical Record Number: [id number] Patient Account Number: [id number] Date of Birth/Sex: Treating RN: [birth year] (61 y.o. [name] Standard Primary Care Yasuko Lapage: Peri Maris Other Clinician: Referring Claudia Alvizo: Treating Torence Palmeri/Extender: Deanna Artis in Treatment: 1 Wound Status Wound Number: 5 Primary Diabetic Wound/Ulcer of the Lower Extremity Etiology: Wound Location: Left, Lateral Foot Wound Open Wounding Event: Gradually Appeared Status: Date Acquired: 02/28/2023 Comorbid Cataracts, Asthma, Sleep Apnea, Peripheral Venous Disease, Type Weeks Of Treatment: 1 History: I Diabetes, Osteomyelitis, Neuropathy, Confinement Anxiety Clustered Wound: No Photos Wound Measurements Length: (cm) Width: (cm) Depth: (cm) Area: (cm) Volume: (cm) Whisnant, Shadae C ([id number]) Wound Description Classification: Grade 2 Exudate Amount: None Pres Foul Odor After Cleansing: Slough/Fibrino 0 % Reduction in Area: 100% 0 % Reduction in Volume: 100% 0 Epithelialization: Large (67-100%) 0 Tunneling: No 0 Undermining:  No [id number] Page 6 of 7 ent No No Wound Bed Granulation Amount: None Present (0%) Exposed Structure Necrotic Amount: None Present (0%) Fascia Exposed: No Fat Layer (Subcutaneous Tissue) Exposed: No Tendon Exposed: No Muscle Exposed: No Joint Exposed: No Bone Exposed: No Periwound Skin Texture Texture Color No Abnormalities Noted: No No Abnormalities Noted: Yes Callus: Yes Temperature / Pain Temperature: No Abnormality Moisture No Abnormalities Noted: Yes Treatment Notes Wound #5 (Foot) Wound Laterality: Left, Lateral Cleanser Peri-Wound Care Topical Primary Dressing Promogran Prisma Matrix, 4.34 (sq in) (silver collagen) Discharge Instruction: Moisten collagen with saline or hydrogel Secondary Dressing Optifoam Non-Adhesive Dressing, 4x4 in Discharge Instruction: Apply over primary dressing cut to make foam donut Woven Gauze Sponges 2x2 in Discharge Instruction: Apply over primary dressing as directed. Secured With Conforming Stretch Gauze Bandage, Sterile 2x75 (in/in) Discharge Instruction: Secure with stretch gauze as directed. 76M Medipore H Soft Cloth Surgical T ape, 4 x 10 (in/yd) Discharge Instruction: Secure with tape as directed. Compression Wrap Compression Stockings Add-Ons Electronic Signature(s) Signed: 03/23/2023 4:40:27 PM By: [name] RN, BSN Entered By: [name] on 03/23/2023 14:41:23 -------------------------------------------------------------------------------- Vitals Details Patient Name: Date of Service: Ilsa Iha, Astrid [name]. 03/23/2023 2:00 PM Medical Record Number: [id number] Patient Account Number: [id number] Date of Birth/Sex: Treating RN: 1961/06/23 (61 y.o. [name] Standard Primary Care Fatim Vanderschaaf: Peri Maris Other Clinician: Referring Francy Mcilvaine: Treating Stevens Magwood/Extender: Deanna Artis in Treatment: 1 Vital Signs Time Taken: 14:08 Temperature (F): 98.1 Height (in):  64 Pulse (bpm): 69 Weight (lbs): 170 Respiratory Rate (breaths/min): 18 Puls, Jordi C ([id number]) [id number].pdf Page 7 of 7 Body Mass Index (BMI): 29.2 Blood Pressure (mmHg): 136/82 Capillary Blood Glucose (mg/dl): 416 Reference Range: 80 - 120 mg / dl Notes glucose per pt's meter at present Electronic  Signature(s) Signed: 03/23/2023 4:40:27 PM By: [name] RN, BSN Entered By: [name] on 03/23/2023 14:09:03

## 2023-03-30 ENCOUNTER — Encounter (HOSPITAL_BASED_OUTPATIENT_CLINIC_OR_DEPARTMENT_OTHER): Payer: Managed Care, Other (non HMO) | Attending: General Surgery | Admitting: General Surgery

## 2023-03-30 DIAGNOSIS — E1043 Type 1 diabetes mellitus with diabetic autonomic (poly)neuropathy: Secondary | ICD-10-CM | POA: Diagnosis not present

## 2023-03-30 DIAGNOSIS — E1022 Type 1 diabetes mellitus with diabetic chronic kidney disease: Secondary | ICD-10-CM | POA: Insufficient documentation

## 2023-03-30 DIAGNOSIS — N1831 Chronic kidney disease, stage 3a: Secondary | ICD-10-CM | POA: Insufficient documentation

## 2023-03-30 DIAGNOSIS — Z89511 Acquired absence of right leg below knee: Secondary | ICD-10-CM | POA: Insufficient documentation

## 2023-03-30 DIAGNOSIS — E10621 Type 1 diabetes mellitus with foot ulcer: Secondary | ICD-10-CM | POA: Insufficient documentation

## 2023-03-30 DIAGNOSIS — J45909 Unspecified asthma, uncomplicated: Secondary | ICD-10-CM | POA: Insufficient documentation

## 2023-03-30 DIAGNOSIS — L97522 Non-pressure chronic ulcer of other part of left foot with fat layer exposed: Secondary | ICD-10-CM | POA: Insufficient documentation

## 2023-03-30 DIAGNOSIS — L03115 Cellulitis of right lower limb: Secondary | ICD-10-CM | POA: Diagnosis not present

## 2023-03-30 DIAGNOSIS — Z794 Long term (current) use of insulin: Secondary | ICD-10-CM | POA: Diagnosis not present

## 2023-03-31 ENCOUNTER — Encounter (HOSPITAL_BASED_OUTPATIENT_CLINIC_OR_DEPARTMENT_OTHER): Payer: Medicare Other | Admitting: Internal Medicine

## 2023-03-31 NOTE — Progress Notes (Signed)
CALINA, PATRIE ([id number]) [id number] Page 1 of 7 Visit Report for 03/30/2023 Arrival Information Details Patient Name: Date of Service: [name], [name] [name] 03/30/2023 12:30 PM Medical Record Number: [id number] Patient Account Number: [id number] Date of Birth/Sex: Treating RN: 24-Feb-1961 (62 y.o. Tommye Standard Primary Care Amareon Phung: Peri Maris Other Clinician: Referring Briget Shaheed: Treating Andrea Ferrer/Extender: Deanna Artis in Treatment: 2 Visit Information History Since Last Visit Added or deleted any medications: No Patient Arrived: Wheel Chair Any new allergies or adverse reactions: No Arrival Time: 12:34 Had a fall or experienced change in No Accompanied By: spouse activities of daily living that may affect Transfer Assistance: Manual risk of falls: Patient Identification Verified: Yes Signs or symptoms of abuse/neglect since last visito No Secondary Verification Process Completed: Yes Hospitalized since last visit: No Patient Requires Transmission-Based Precautions: No Implantable device outside of the clinic excluding No Patient Has Alerts: No cellular tissue based products placed in the center since last visit: Has Dressing in Place as Prescribed: Yes Has Footwear/Offloading in Place as Prescribed: Yes Left: Other:brace Pain Present Now: No Electronic Signature(s) Signed: 03/30/2023 4:44:16 PM By: [name] RN, BSN Entered By: [name] on 03/30/2023 12:37:09 -------------------------------------------------------------------------------- Clinic Level of Care Assessment Details Patient Name: Date of Service: [place], [name] 03/30/2023 12:30 PM Medical Record Number: [id number] Patient Account Number: [id number] Date of Birth/Sex: Treating RN: [birth year] (61 y.o. Tommye Standard Primary Care Alvita Fana: Peri Maris Other Clinician: Referring Berish Bohman: Treating Feleshia Zundel/Extender: Deanna Artis in Treatment: 2 Clinic Level of Care Assessment Items TOOL 4 Quantity Score []  - 0 Use when only an EandM is performed on FOLLOW-UP visit ASSESSMENTS - Nursing Assessment / Reassessment X- 1 10 Reassessment of Co-morbidities (includes updates in patient status) X- 1 5 Reassessment of Adherence to Treatment Plan ASSESSMENTS - Wound and Skin A ssessment / Reassessment X - Simple Wound Assessment / Reassessment - one wound 1 5 []  - 0 Complex Wound Assessment / Reassessment - multiple wounds []  - 0 Dermatologic / Skin Assessment (not related to wound area) ASSESSMENTS - Focused Assessment []  - 0 Circumferential Edema Measurements - multi extremities []  - 0 Nutritional Assessment / Counseling / Intervention X- 1 5 Lower Extremity Assessment (monofilament, tuning fork, pulses) []  - 0 Peripheral Arterial Disease Assessment (using hand held doppler) ASSESSMENTS - Ostomy and/or Continence Assessment and Care []  - 0 Incontinence Assessment and Management []  - 0 Ostomy Care Assessment and Management (repouching, etc.) [name], [name] ([id number]) [id number] Page 2 of 7 PROCESS - Coordination of Care X - Simple Patient / Family Education for ongoing care 1 15 []  - 0 Complex (extensive) Patient / Family Education for ongoing care X- 1 10 Staff obtains Chiropractor, Records, T Results / Process Orders est []  - 0 Staff telephones HHA, Nursing Homes / Clarify orders / etc []  - 0 Routine Transfer to another Facility (non-emergent condition) []  - 0 Routine Hospital Admission (non-emergent condition) []  - 0 New Admissions / Manufacturing engineer / Ordering NPWT Apligraf, etc. , []  - 0 Emergency Hospital Admission (emergent condition) X- 1 10 Simple Discharge Coordination []  - 0 Complex (extensive) Discharge Coordination PROCESS - Special Needs []  - 0 Pediatric / Minor Patient Management []  - 0 Isolation Patient Management []  - 0 Hearing /  Language / Visual special needs []  - 0 Assessment of Community assistance (transportation, D/C planning, etc.) []  - 0 Additional assistance / Altered mentation []  - 0 Support Surface(s) Assessment (bed, cushion, seat, etc.) INTERVENTIONS -  Wound Cleansing / Measurement X - Simple Wound Cleansing - one wound 1 5 []  - 0 Complex Wound Cleansing - multiple wounds X- 1 5 Wound Imaging (photographs - any number of wounds) []  - 0 Wound Tracing (instead of photographs) []  - 0 Simple Wound Measurement - one wound []  - 0 Complex Wound Measurement - multiple wounds INTERVENTIONS - Wound Dressings []  - 0 Small Wound Dressing one or multiple wounds []  - 0 Medium Wound Dressing one or multiple wounds []  - 0 Large Wound Dressing one or multiple wounds []  - 0 Application of Medications - topical []  - 0 Application of Medications - injection INTERVENTIONS - Miscellaneous []  - 0 External ear exam []  - 0 Specimen Collection (cultures, biopsies, blood, body fluids, etc.) []  - 0 Specimen(s) / Culture(s) sent or taken to Lab for analysis []  - 0 Patient Transfer (multiple staff / Nurse, adult / Similar devices) []  - 0 Simple Staple / Suture removal (25 or less) []  - 0 Complex Staple / Suture removal (26 or more) []  - 0 Hypo / Hyperglycemic Management (close monitor of Blood Glucose) []  - 0 Ankle / Brachial Index (ABI) - do not check if billed separately X- 1 5 Vital Signs Has the patient been seen at the hospital within the last three years: Yes Total Score: 75 Level Of Care: New/Established - Level 2 Electronic Signature(s) Signed: 03/30/2023 4:44:16 PM By: [name] RN, BSN [place], SignedSalena Saner ([id number]) PM By: [name] RN, BSN [facility] 03/30/2023 4:44:16 [id number].pdf Page 3 of 7 Entered By: [name] on 03/30/2023 13:00:36 -------------------------------------------------------------------------------- Encounter Discharge Information  Details Patient Name: Date of Service: [name], [name] 03/30/2023 12:30 PM Medical Record Number: [id number] Patient Account Number: [id number] Date of Birth/Sex: Treating RN: Sep 01, 1961 (62 y.o. Tommye Standard Primary Care Maylee Bare: Peri Maris Other Clinician: Referring Cynithia Hakimi: Treating Nguyet Mercer/Extender: Deanna Artis in Treatment: 2 Encounter Discharge Information Items Discharge Condition: Stable Ambulatory Status: Wheelchair Discharge Destination: Home Transportation: Private Auto Accompanied By: spouse Schedule Follow-up Appointment: Yes Clinical Summary of Care: Patient Declined Electronic Signature(s) Signed: 03/30/2023 4:44:16 PM By: [name] RN, BSN Entered By: [name] on 03/30/2023 13:03:34 -------------------------------------------------------------------------------- Lower Extremity Assessment Details Patient Name: Date of Service: [name], [name] Divine. 03/30/2023 12:30 PM Medical Record Number: [id number] Patient Account Number: [id number] Date of Birth/Sex: Treating RN: Feb 23, 1961 (61 y.o. Tommye Standard Primary Care [place]: Peri Maris Other Clinician: Referring Marionna Gonia: Treating Shaquala Broeker/Extender: Deanna Artis in Treatment: 2 Edema Assessment Assessed: [Left: No] [Right: No] Edema: [Left: N] [Right: o] Calf Left: Right: Point of Measurement: From Medial Instep 28.5 cm Ankle Left: Right: Point of Measurement: From Medial Instep 19.5 cm Vascular Assessment Pulses: Dorsalis Pedis Palpable: [Left:Yes] Electronic Signature(s) Signed: 03/30/2023 4:44:16 PM By: [name] RN, BSN Entered By: [name] on 03/30/2023 12:42:39 -------------------------------------------------------------------------------- Multi Wound Chart Details Patient Name: Date of Service: [name] Sprinkles. 03/30/2023 12:30 PM Medical Record Number: [id number] Patient Account Number: [id number] Date of Birth/Sex:  Treating RN: May 06, 1961 (61 y.o. Tommye Standard Primary Care Laith Antonelli: Peri Maris Other Clinician: [name] ([id number]) [id number] Page 4 of 7 Referring Tiarah Shisler: Treating Talissa Apple/Extender: Deanna Artis in Treatment: 2 Vital Signs Height(in): 64 Capillary Blood Glucose(mg/dl): 433 Weight(lbs): 295 Pulse(bpm): 73 Body Mass Index(BMI): 29.2 Blood Pressure(mmHg): 110/70 Temperature(F): 98.1 Respiratory Rate(breaths/min): 18 [5:Photos:] [N/A:N/A] Left, Lateral Foot N/A N/A Wound Location: Gradually Appeared N/A N/A Wounding Event: Diabetic Wound/Ulcer of the Lower N/A N/A Primary Etiology:  Extremity Cataracts, Asthma, Sleep Apnea, N/A N/A Comorbid History: Peripheral Venous Disease, Type I Diabetes, Osteomyelitis, Neuropathy, Confinement Anxiety 02/28/2023 N/A N/A Date Acquired: 2 N/A N/A Weeks of Treatment: Open N/A N/A Wound Status: No N/A N/A Wound Recurrence: 0x0x0 N/A N/A Measurements L x W x D (cm) 0 N/A N/A A (cm) : rea 0 N/A N/A Volume (cm) : 100.00% N/A N/A % Reduction in A rea: 100.00% N/A N/A % Reduction in Volume: Grade 2 N/A N/A Classification: None Present N/A N/A Exudate A mount: None Present (0%) N/A N/A Granulation A mount: None Present (0%) N/A N/A Necrotic A mount: Fascia: No N/A N/A Exposed Structures: Fat Layer (Subcutaneous Tissue): No Tendon: No Muscle: No Joint: No Bone: No Large (67-100%) N/A N/A Epithelialization: Callus: No N/A N/A Periwound Skin Texture: No Abnormalities Noted N/A N/A Periwound Skin Moisture: No Abnormalities Noted N/A N/A Periwound Skin Color: No Abnormality N/A N/A Temperature: Treatment Notes Electronic Signature(s) Signed: 03/30/2023 12:55:59 PM By: Duanne Guess MD FACS Signed: 03/30/2023 4:44:16 PM By: [name] RN, BSN Entered By: Duanne Guess on 03/30/2023  12:55:59 -------------------------------------------------------------------------------- Multi-Disciplinary Care Plan Details Patient Name: Date of Service: [name], [name] Divine. 03/30/2023 12:30 PM Medical Record Number: [id number] Patient Account Number: [id number] Date of Birth/Sex: Treating RN: 1961-05-09 (61 y.o. Tommye Standard Primary Care [place] Lasseter: Peri Maris Other Clinician: Referring Jema Deegan: Treating Cylah Fannin/Extender: Deanna Artis in Treatment: 2 Multidisciplinary Care Plan reviewed with physician [address] AUDREYANNA, BUTKIEWICZ ([id number]) [id number] Page 5 of 7 Electronic Signature(s) Signed: 03/30/2023 4:44:16 PM By: [name] RN, BSN Entered By: [name] on 03/30/2023 12:47:18 -------------------------------------------------------------------------------- Pain Assessment Details Patient Name: Date of Service: [name], [name] 03/30/2023 12:30 PM Medical Record Number: [id number] Patient Account Number: [id number] Date of Birth/Sex: Treating RN: Nov 11, 1961 (61 y.o. Tommye Standard Primary Care Aleda Madl: Peri Maris Other Clinician: Referring Shaylon Gillean: Treating Mckenzey Parcell/Extender: Deanna Artis in Treatment: 2 Active Problems Location of Pain Severity and Description of Pain Patient Has Paino No Site Locations Rate the pain. Current Pain Level: 0 Pain Management and Medication Current Pain Management: Electronic Signature(s) Signed: 03/30/2023 4:44:16 PM By: [name] RN, BSN Entered By: [name] on 03/30/2023 12:41:37 -------------------------------------------------------------------------------- Patient/Caregiver Education Details Patient Name: Date of Service: [name] Sprinkles 5/1/2024andnbsp12:30 PM Medical Record Number: [id number] Patient Account Number: [id number] Date of Birth/Gender: Treating RN: Feb 23, 1961 (61 y.o. Tommye Standard Primary Care  Physician: Peri Maris Other Clinician: Referring Physician: Treating Physician/Extender: Deanna Artis in Treatment: 2 Education Assessment Education Provided To: Patient Education Topics Provided Elevated Blood Sugar/ Impact on Healing: Methods: Explain/Verbal Responses: Reinforcements needed, State content correctly OffloadingJAZARI, [name] ([id number]) [id number] Page 6 of 7 Methods: Explain/Verbal Responses: Reinforcements needed, State content correctly Wound/Skin Impairment: Methods: Explain/Verbal Responses: Reinforcements needed, State content correctly Electronic Signature(s) Signed: 03/30/2023 4:44:16 PM By: [name] RN, BSN Entered By: [name] on 03/30/2023 12:48:00 -------------------------------------------------------------------------------- Wound Assessment Details Patient Name: Date of Service: [name] Sprinkles. 03/30/2023 12:30 PM Medical Record Number: [id number] Patient Account Number: [id number] Date of Birth/Sex: Treating RN: Feb 02, 1961 (61 y.o. Tommye Standard Primary Care Dajanae Brophy: Peri Maris Other Clinician: Referring Wesly Whisenant: Treating Jule Whitsel/Extender: Deanna Artis in Treatment: 2 Wound Status Wound Number: 5 Primary Diabetic Wound/Ulcer of the Lower Extremity Etiology: Wound Location: Left, Lateral Foot Wound Open Wounding Event: Gradually Appeared Status: Date Acquired: 02/28/2023 Comorbid Cataracts, Asthma, Sleep Apnea, Peripheral Venous Disease, Type Weeks Of Treatment: 2 History: I  Diabetes, Osteomyelitis, Neuropathy, Confinement Anxiety Clustered Wound: No Photos Wound Measurements Length: (cm) Width: (cm) Depth: (cm) Area: (cm) Volume: (cm) 0 % Reduction in Area: 100% 0 % Reduction in Volume: 100% 0 Epithelialization: Large (67-100%) 0 Tunneling: No 0 Undermining: No Wound Description Classification: Grade 2 Exudate Amount: None  Present Foul Odor After Cleansing: No Slough/Fibrino No Wound Bed Granulation Amount: None Present (0%) Exposed Structure Necrotic Amount: None Present (0%) Fascia Exposed: No Fat Layer (Subcutaneous Tissue) Exposed: No Tendon Exposed: No Muscle Exposed: No Joint Exposed: No Bone Exposed: No Periwound Skin Texture Texture Color No Abnormalities Noted: No No Abnormalities Noted: Yes Callus: No Temperature / Pain Temperature: No Abnormality Moisture [name], [name] ([id number]) [phone].pdf Page 7 of 7 No Abnormalities Noted: Yes Electronic Signature(s) Signed: 03/30/2023 4:44:16 PM By: [name] RN, BSN Entered By: [name] on 03/30/2023 12:46:38 -------------------------------------------------------------------------------- Vitals Details Patient Name: Date of Service: [name] Sprinkles. 03/30/2023 12:30 PM Medical Record Number: [id number] Patient Account Number: [id number] Date of Birth/Sex: Treating RN: 04-29-1961 (61 y.o. Tommye Standard Primary Care Zayneb Baucum: Peri Maris Other Clinician: Referring Shawnae Leiva: Treating Shalie Schremp/Extender: Deanna Artis in Treatment: 2 Vital Signs Time Taken: 12:37 Temperature (F): 98.1 Height (in): 64 Pulse (bpm): 73 Weight (lbs): 170 Respiratory Rate (breaths/min): 18 Body Mass Index (BMI): 29.2 Blood Pressure (mmHg): 110/70 Capillary Blood Glucose (mg/dl): 010 Reference Range: 80 - 120 mg / dl Notes glucose per pt report this am Electronic Signature(s) Signed: 03/30/2023 4:44:16 PM By: [name] RN, BSN Entered By: [name] on 03/30/2023 12:41:10

## 2023-03-31 NOTE — Progress Notes (Addendum)
[name], [name] ([id number]) [id number] Page 1 of 8 Visit Report for 03/30/2023 Chief Complaint Document Details Patient Name: Date of Service: [name], [name] [name] 03/30/2023 12:30 PM Medical Record Number: [id number] Patient Account Number: [id number] Date of Birth/Sex: Treating RN: [birth year] (62 y.o. Tommye Standard Primary Care Provider: Peri Maris Other Clinician: Referring Provider: Treating Provider/Extender: Deanna Artis in Treatment: 2 Information Obtained from: Patient Chief Complaint Patients presents for treatment of an open diabetic ulcer of the left foot Electronic Signature(s) Signed: 03/30/2023 12:56:07 PM By: Duanne Guess MD FACS Entered By: Duanne Guess on 03/30/2023 12:56:07 -------------------------------------------------------------------------------- HPI Details Patient Name: Date of Service: [name], [name]. 03/30/2023 12:30 PM Medical Record Number: [id number] Patient Account Number: [id number] Date of Birth/Sex: Treating RN: [birth year] (61 y.o. Tommye Standard Primary Care Provider: Peri Maris Other Clinician: Referring Provider: Treating Provider/Extender: Deanna Artis in Treatment: 2 History of Present Illness HPI Description: ADMISSION 11/03/2020 this is a 62 year old woman with type 2 diabetes. She has severe peripheral neuropathy and autonomic neuropathy. 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 Her problem started at the beginning of July her husband noted a bleeding area in her sock. Unfortunately this became infected requiring admission to hospital from 7/12 through 06/13/2020. An MRI was negative for osteo she received IV antibiotics and was discharged on doxycycline. She did have standard arterial ABIs and TBI's but I did not see any waveforms. Fortunately the ABIs and TBI's were normal at 1.09 and 0.69 on the right  and 1.01 and 1.02 on the left respectively. Her last visit with Dr. Allena Katz of podiatry was on 07/04/2020. At 1 which time the measurements were 1.1 x 0.8 x 0.3 she was using a dry dressing. She went on to see [facility]. Grandville [facility] at which time the wound was debrided and they were prescribed Santyl. The wound is actually on the lateral foot over the base of the fifth metatarsal. She had an additional wound on her right heel but this closed over. They are currently soaking the foot in Epson salts and using Santyl and they have been doing this for quite a period of time. Past medical history includes type 1 diabetes managed with an insulin pump, movement disorder, hypothyroidism, obstructive sleep apnea. She has a cam boot and she wears this religiously spending most of her day currently in bed but she needs the boot on to get up to use her bedside commode ABIs were done during the hospitalization in July that showed the numbers quoted above 12/13; right lateral foot which is in the setting of an inverted ankle and likely to be this patient's full weightbearing surface. There is some eschar and slough around the wound circumference but in general the wound surface does not look too bad. She has new diabetic shoes and a brace for when this heals hopefully this will maintain skin integrity. Other than that I think she would need an attempt at surgery if a procedure is available to try and correct this weightbearing to form 12/20; right lateral foot in the setting of a severely inverted ankle. The patient is minimally ambulatory at this point. The areas over the right fifth metatarsal base. 12/01/2020; her right lateral foot wound looks a lot better however she comes in today with full de-epithelialized over the right second third and fourth toes with intense erythema and the erythema is spreading into  the dorsal foot. Her husband is able to show me pictures which seems to start around  Christmas day. She is insensate and therefore does not feel pain. She has not had any systemic symptoms including fever chills. Her CBGs have been in the usual range for her. She has an allergy to penicillin based on a childhood issue but they tell me she has had ampicillin or amoxicillin since then and tolerated it well other than the fact that most antibiotics seem to cause her nausea 1/10; right lateral foot continues to improve. The area of cellulitis on the right dorsal foot and the erythema on the second third and fourth toes caused me to put her on empiric cephalexin last time. There was nothing to culture. T oday she comes in with erythema receded from the dorsal foot but still markedly erythematous second third and fourth toes. She has wounds especially between the third and fourth but also an area between the second and third. She also has what looks to be a paronychia a developing on the right medial first toe 1/18; her original wound on the right lateral foot looks about the same somewhat dry. While we are taking care of this she developed intense cellulitis involving the second third and fourth toes spreading into the forefoot. She had 10 days of cephalexin and things have generally improved quite a bit and the second and third toes dorsal foot but not the fourth toe. She has a substantial wound on the fourth toe medially with now exposed bone. We have been using silver alginate here. Hydrofera Blue to the lateral foot 1/25; her original wound on the right lateral foot at the fifth metatarsal base continues to contract this looks healthy. The subsequent wounds on her right fourth third and second toes continue to be problematic. The BONE SCRAPING PCR on the right fourth toe medial aspect last week showed MSSA and she is now on Bactrim I may continue this for at least a month. We are using silver alginate on the toes Hydrofera Blue on the right lateral foot 1/31 continues on Bactrim. The  area on the right lateral foot at the base of the fifth metatarsal is smaller still with some depth. But as long as this is improving [name] ([id number]) [id number] Page 2 of 8 in terms of surface area no debridement. Using [facility] on this area. All of her wounds on her toes look less extensive calcium alginate as the primary dressing here 2/14; the area on the right lateral foot continues to be mostly closed over. Still some dry flaking skin and eschar. The only other wound I can identify is on the medial part of the right second toe there is some erythema here but I do not think there is infection we have been using silver alginate Her husband tells me he took the [facility] off because he thought things were too moist. 2/28; the patient's wound is closed on the right lateral foot at roughly the base of the fifth metatarsal. Also the area on the right second toe is closed. She has a severe ankle inversion history of Charcot foot. They tried to put her AFO brace on her over the weekend and it really did not work looking at her foot I am not surprised READMISSION 03/14/2023 Since her last admission to the wound care center, she has undergone right below-knee amputation. She is wearing an AFO on the left to address foot drop. She has developed an ulcer on  the lateral aspect of her left foot, just distal to the ankle. There is thickening and callus buildup, suggesting that her orthotic may be rubbing in this area. The wound exposes the fat layer. There is no malodor or purulent drainage. It has been present for about 2 weeks and they have been treating it with some Hydrofera Blue that they had leftover from her previous admission. 03/23/2023: The wound has callused over. They did go to the orthotist and some adjustments have been made to her AFO. 03/30/2023: Her wound is healed. Electronic Signature(s) Signed: 03/30/2023 12:56:25 PM By: Duanne Guess MD  FACS Entered By: Duanne Guess on 03/30/2023 12:56:25 -------------------------------------------------------------------------------- Physical Exam Details Patient Name: Date of Service: [name] 03/30/2023 12:30 PM Medical Record Number: [id number] Patient Account Number: [id number] Date of Birth/Sex: Treating RN: 31-Dec-1960 (61 y.o. Tommye Standard Primary Care Provider: Peri Maris Other Clinician: Referring Provider: Treating Provider/Extender: Deanna Artis in Treatment: 2 Constitutional . . . . no acute distress. Respiratory Normal work of breathing on room air. Notes 03/30/2023: Her wound is healed. Electronic Signature(s) Signed: 03/30/2023 12:56:58 PM By: Duanne Guess MD FACS Entered By: Duanne Guess on 03/30/2023 12:56:58 -------------------------------------------------------------------------------- Physician Orders Details Patient Name: Date of Service: [name]. 03/30/2023 12:30 PM Medical Record Number: [id number] Patient Account Number: [id number] Date of Birth/Sex: Treating RN: 1960-12-30 (61 y.o. Tommye Standard Primary Care Provider: Peri Maris Other Clinician: Referring Provider: Treating Provider/Extender: Deanna Artis in Treatment: 2 Verbal / Phone Orders: No Diagnosis Coding ICD-10 Coding Code Description [phone] Non-pressure chronic ulcer of other part of left foot with fat layer exposed E10.621 Type 1 diabetes mellitus with foot ulcer E10.43 Type 1 diabetes mellitus with diabetic autonomic (poly)neuropathy N18.31 Chronic kidney disease, stage 3a Z89.511 Acquired absence of right leg below knee Discharge From [facility] CARYLE, HELGESON C ([id number]) [phone].pdf Page 3 of 8 Discharge from Wound Care Center Bathing/ Shower/ Hygiene May shower and wash wound with soap and water. Off-Loading Other: - foam donut or callous cushion over calloused  area Electronic Signature(s) Signed: 03/30/2023 12:57:11 PM By: Duanne Guess MD FACS Entered By: Duanne Guess on 03/30/2023 12:57:10 -------------------------------------------------------------------------------- Problem List Details Patient Name: Date of Service: [name]. 03/30/2023 12:30 PM Medical Record Number: [id number] Patient Account Number: [id number] Date of Birth/Sex: Treating RN: 24-Dec-1960 (61 y.o. Tommye Standard Primary Care Provider: Peri Maris Other Clinician: Referring Provider: Treating Provider/Extender: Deanna Artis in Treatment: 2 Active Problems ICD-10 Encounter Code Description Active Date MDM Diagnosis [phone] Non-pressure chronic ulcer of other part of left foot with fat layer exposed 03/14/2023 No Yes E10.621 Type 1 diabetes mellitus with foot ulcer 03/14/2023 No Yes E10.43 Type 1 diabetes mellitus with diabetic autonomic (poly)neuropathy 03/14/2023 No Yes N18.31 Chronic kidney disease, stage 3a 03/14/2023 No Yes Z89.511 Acquired absence of right leg below knee 03/14/2023 No Yes Inactive Problems Resolved Problems Electronic Signature(s) Signed: 03/30/2023 12:55:53 PM By: Duanne Guess MD FACS Entered By: Duanne Guess on 03/30/2023 12:55:53 -------------------------------------------------------------------------------- Progress Note Details Patient Name: Date of Service: [name]. 03/30/2023 12:30 PM Medical Record Number: [id number] Patient Account Number: [id number] Date of Birth/Sex: Treating RN: 04/06/1961 (61 y.o. Tommye Standard Primary Care Provider: Peri Maris Other Clinician: Referring Provider: Treating Provider/Extender: Deanna Artis in Treatment: 2 Subjective [name], [name] ([id number]) [id number] Page 4 of 8 Chief Complaint Information obtained from Patient Patients presents for treatment of an open diabetic  ulcer of the left  foot History of Present Illness (HPI) ADMISSION 11/03/2020 this is a 62 year old woman with type 2 diabetes. She has severe peripheral neuropathy and autonomic neuropathy. 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 Her problem started at the beginning of July her husband noted a bleeding area in her sock. Unfortunately this became infected requiring admission to hospital from 7/12 through 06/13/2020. An MRI was negative for osteo she received IV antibiotics and was discharged on doxycycline. She did have standard arterial ABIs and TBI's but I did not see any waveforms. Fortunately the ABIs and TBI's were normal at 1.09 and 0.69 on the right and 1.01 and 1.02 on the left respectively. Her last visit with Dr. Allena Katz of podiatry was on 07/04/2020. At 1 which time the measurements were 1.1 x 0.8 x 0.3 she was using a dry dressing. She went on to see [facility]ville [facility] at which time the wound was debrided and they were prescribed Santyl. The wound is actually on the lateral foot over the base of the fifth metatarsal. She had an additional wound on her right heel but this closed over. They are currently soaking the foot in Epson salts and using Santyl and they have been doing this for quite a period of time. Past medical history includes type 1 diabetes managed with an insulin pump, movement disorder, hypothyroidism, obstructive sleep apnea. She has a cam boot and she wears this religiously spending most of her day currently in bed but she needs the boot on to get up to use her bedside commode ABIs were done during the hospitalization in July that showed the numbers quoted above 12/13; right lateral foot which is in the setting of an inverted ankle and likely to be this patient's full weightbearing surface. There is some eschar and slough around the wound circumference but in general the wound surface does not look too bad. She has  new diabetic shoes and a brace for when this heals hopefully this will maintain skin integrity. Other than that I think she would need an attempt at surgery if a procedure is available to try and correct this weightbearing to form 12/20; right lateral foot in the setting of a severely inverted ankle. The patient is minimally ambulatory at this point. The areas over the right fifth metatarsal base. 12/01/2020; her right lateral foot wound looks a lot better however she comes in today with full de-epithelialized over the right second third and fourth toes with intense erythema and the erythema is spreading into the dorsal foot. Her husband is able to show me pictures which seems to start around Christmas day. She is insensate and therefore does not feel pain. She has not had any systemic symptoms including fever chills. Her CBGs have been in the usual range for her. She has an allergy to penicillin based on a childhood issue but they tell me she has had ampicillin or amoxicillin since then and tolerated it well other than the fact that most antibiotics seem to cause her nausea 1/10; right lateral foot continues to improve. ooThe area of cellulitis on the right dorsal foot and the erythema on the second third and fourth toes caused me to put her on empiric cephalexin last time. There was nothing to culture. T oday she comes in with erythema receded from the dorsal foot but still markedly erythematous second third and fourth toes. She has wounds especially  between the third and fourth but also an area between the second and third. She also has what looks to be a paronychia a developing on the right medial first toe 1/18; her original wound on the right lateral foot looks about the same somewhat dry. While we are taking care of this she developed intense cellulitis involving the second third and fourth toes spreading into the forefoot. She had 10 days of cephalexin and things have generally improved quite  a bit and the second and third toes dorsal foot but not the fourth toe. She has a substantial wound on the fourth toe medially with now exposed bone. We have been using silver alginate here. Hydrofera Blue to the lateral foot 1/25; her original wound on the right lateral foot at the fifth metatarsal base continues to contract this looks healthy. The subsequent wounds on her right fourth third and second toes continue to be problematic. The BONE SCRAPING PCR on the right fourth toe medial aspect last week showed MSSA and she is now on Bactrim I may continue this for at least a month. We are using silver alginate on the toes Hydrofera Blue on the right lateral foot 1/31 continues on Bactrim. The area on the right lateral foot at the base of the fifth metatarsal is smaller still with some depth. But as long as this is improving in terms of surface area no debridement. Using [facility] on this area. All of her wounds on her toes look less extensive calcium alginate as the primary dressing here 2/14; the area on the right lateral foot continues to be mostly closed over. Still some dry flaking skin and eschar. The only other wound I can identify is on the medial part of the right second toe there is some erythema here but I do not think there is infection we have been using silver alginate Her husband tells me he took the [facility] off because he thought things were too moist. 2/28; the patient's wound is closed on the right lateral foot at roughly the base of the fifth metatarsal. Also the area on the right second toe is closed. She has a severe ankle inversion history of Charcot foot. They tried to put her AFO brace on her over the weekend and it really did not work looking at her foot I am not surprised READMISSION 03/14/2023 Since her last admission to the wound care center, she has undergone right below-knee amputation. She is wearing an AFO on the left to address foot drop. She has  developed an ulcer on the lateral aspect of her left foot, just distal to the ankle. There is thickening and callus buildup, suggesting that her orthotic may be rubbing in this area. The wound exposes the fat layer. There is no malodor or purulent drainage. It has been present for about 2 weeks and they have been treating it with some Hydrofera Blue that they had leftover from her previous admission. 03/23/2023: The wound has callused over. They did go to the orthotist and some adjustments have been made to her AFO. 03/30/2023: Her wound is healed. Patient History Information obtained from Patient. Family History Cancer - Father, Diabetes - Father,Maternal Grandparents, Hypertension - Mother, Lung Disease - Father, Thyroid Problems - Mother, No family history of Heart Disease, Hereditary Spherocytosis, Kidney Disease, Seizures, Stroke, Tuberculosis. Social History Never smoker, Marital Status - Married, Alcohol Use - Never, Drug Use - No History, Caffeine Use - Rarely. Medical History Eyes Patient has history of Cataracts -  bil extractions Denies history of Glaucoma, Optic Neuritis Ear/Nose/Mouth/Throat Denies history of Chronic sinus problems/congestion, Middle ear problems Hematologic/Lymphatic Denies history of Anemia, Hemophilia, Human Immunodeficiency Virus, Lymphedema, Sickle Cell Disease Respiratory Patient has history of Asthma, Sleep Apnea Denies history of Aspiration, Chronic Obstructive Pulmonary Disease (COPD), Pneumothorax, Tuberculosis COREN, CROWNOVER C ([id number]) [id number] Page 5 of 8 Cardiovascular Patient has history of Peripheral Venous Disease Denies history of Angina, Arrhythmia, Congestive Heart Failure, Coronary Artery Disease, Deep Vein Thrombosis, Hypertension, Hypotension, Myocardial Infarction, Peripheral Arterial Disease, Phlebitis, Vasculitis Gastrointestinal Denies history of Cirrhosis , Colitis, Crohnoos, Hepatitis A, Hepatitis B,  Hepatitis C Endocrine Patient has history of Type I Diabetes Denies history of Type II Diabetes Genitourinary Denies history of End Stage Renal Disease Immunological Denies history of Lupus Erythematosus, Raynaudoos, Scleroderma Integumentary (Skin) Denies history of History of Burn Musculoskeletal Patient has history of Osteomyelitis - hx right foot Denies history of Gout, Rheumatoid Arthritis, Osteoarthritis Neurologic Patient has history of Neuropathy Denies history of Dementia, Quadriplegia, Paraplegia, Seizure Disorder Oncologic Denies history of Received Chemotherapy, Received Radiation Psychiatric Patient has history of Confinement Anxiety Denies history of Anorexia/bulimia Hospitalization/Surgery History - right BKA. - esophageal balloon dilitation. - eye surgery. - trigger finger release. - bil cataract extraction. - abdominal hysterectomy. - carparl tunnel release right. - tonsillectomy. - appendectomy. Medical A Surgical History Notes nd Eyes diabetic retinopathy Cardiovascular hypotension Gastrointestinal gastroporesis, GERD, esophageal stricture Genitourinary CKD stage 3 Objective Constitutional no acute distress. Vitals Time Taken: 12:37 PM, Height: 64 in, Weight: 170 lbs, BMI: 29.2, Temperature: 98.1 F, Pulse: 73 bpm, Respiratory Rate: 18 breaths/min, Blood Pressure: 110/70 mmHg, Capillary Blood Glucose: 152 mg/dl. General Notes: glucose per pt report this am Respiratory Normal work of breathing on room air. General Notes: 03/30/2023: Her wound is healed. Integumentary (Hair, Skin) Wound #5 status is Open. Original cause of wound was Gradually Appeared. The date acquired was: 02/28/2023. The wound has been in treatment 2 weeks. The wound is located on the Left,Lateral Foot. The wound measures 0cm length x 0cm width x 0cm depth; 0cm^2 area and 0cm^3 volume. There is no tunneling or undermining noted. There is a none present amount of drainage noted. There is  no granulation within the wound bed. There is no necrotic tissue within the wound bed. The periwound skin appearance had no abnormalities noted for moisture. The periwound skin appearance had no abnormalities noted for color. The periwound skin appearance did not exhibit: Callus. Periwound temperature was noted as No Abnormality. Assessment Active Problems ICD-10 Non-pressure chronic ulcer of other part of left foot with fat layer exposed Type 1 diabetes mellitus with foot ulcer Type 1 diabetes mellitus with diabetic autonomic (poly)neuropathy Chronic kidney disease, stage 3a Acquired absence of right leg below knee [name], [name] ([id number]) [id number] Page 6 of 8 Plan Discharge From [facility]: Discharge from Wound Care Center Bathing/ Shower/ Hygiene: May shower and wash wound with soap and water. Off-Loading: Other: - foam donut or callous cushion over calloused area 03/30/2023: Her wound is healed. Although her AFO has been adjusted by her orthotist, I have recommended that she continue to use a callus pad or foam donut cushion as she has bony prominence in this area is at risk for further pressure and friction. I also suggested she go back to the orthotist to confirm that all potential adjustments have been appropriately made to avoid future wounding events. We will discharge her from the wound care center. She may follow-up as needed. Electronic Signature(s) Signed: 03/30/2023 12:58:27  PM By: Duanne Guess MD FACS Entered By: Duanne Guess on 03/30/2023 12:58:27 -------------------------------------------------------------------------------- HxROS Details Patient Name: Date of Service: [name], [name] 03/30/2023 12:30 PM Medical Record Number: [id number] Patient Account Number: [id number] Date of Birth/Sex: Treating RN: [birth year] (61 y.o. Tommye Standard Primary Care Provider: Peri Maris Other Clinician: Referring Provider: Treating  Provider/Extender: Deanna Artis in Treatment: 2 Information Obtained From Patient Eyes Medical History: Positive for: Cataracts - bil extractions Negative for: Glaucoma; Optic Neuritis Past Medical History Notes: diabetic retinopathy Ear/Nose/Mouth/Throat Medical History: Negative for: Chronic sinus problems/congestion; Middle ear problems Hematologic/Lymphatic Medical History: Negative for: Anemia; Hemophilia; Human Immunodeficiency Virus; Lymphedema; Sickle Cell Disease Respiratory Medical History: Positive for: Asthma; Sleep Apnea Negative for: Aspiration; Chronic Obstructive Pulmonary Disease (COPD); Pneumothorax; Tuberculosis Cardiovascular Medical History: Positive for: Peripheral Venous Disease Negative for: Angina; Arrhythmia; Congestive Heart Failure; Coronary Artery Disease; Deep Vein Thrombosis; Hypertension; Hypotension; Myocardial Infarction; Peripheral Arterial Disease; Phlebitis; Vasculitis Past Medical History Notes: hypotension Gastrointestinal Medical History: Negative for: Cirrhosis ; Colitis; Crohns; Hepatitis A; Hepatitis B; Hepatitis C Past Medical History Notes: gastroporesis, GERD, esophageal stricture [name], [name] ([id number]) [id number] Page 7 of 8 Endocrine Medical History: Positive for: Type I Diabetes Negative for: Type II Diabetes Time with diabetes: 30 Treated with: Insulin Blood sugar tested every day: Yes Tested : dexcom Genitourinary Medical History: Negative for: End Stage Renal Disease Past Medical History Notes: CKD stage 3 Immunological Medical History: Negative for: Lupus Erythematosus; Raynauds; Scleroderma Integumentary (Skin) Medical History: Negative for: History of Burn Musculoskeletal Medical History: Positive for: Osteomyelitis - hx right foot Negative for: Gout; Rheumatoid Arthritis; Osteoarthritis Neurologic Medical History: Positive for:  Neuropathy Negative for: Dementia; Quadriplegia; Paraplegia; Seizure Disorder Oncologic Medical History: Negative for: Received Chemotherapy; Received Radiation Psychiatric Medical History: Positive for: Confinement Anxiety Negative for: Anorexia/bulimia HBO Extended History Items Eyes: Cataracts Immunizations Pneumococcal Vaccine: Received Pneumococcal Vaccination: Yes Received Pneumococcal Vaccination On or After 60th Birthday: No Implantable Devices None Hospitalization / Surgery History Type of Hospitalization/Surgery right BKA esophageal balloon dilitation eye surgery trigger finger release bil cataract extraction abdominal hysterectomy [place] tunnel release right tonsillectomy appendectomy Family and Social History Cancer: Yes - Father; Diabetes: Yes - Father,Maternal Grandparents; Heart Disease: No; Hereditary Spherocytosis: No; Hypertension: Yes - Mother; Kidney Disease: No; Lung Disease: Yes - Father; Seizures: No; Stroke: No; Thyroid Problems: Yes - Mother; Tuberculosis: No; Never smoker; Marital Status - Married; Alcohol Use: Never; Drug Use: No History; Caffeine Use: Rarely; Financial Concerns: No; Food, Clothing or Shelter Needs: No; Support System [name], [name] ([id number]) [id number] Page 8 of 8 Lacking: No; Transportation Concerns: No Electronic Signature(s) Signed: 03/30/2023 1:20:47 PM By: Duanne Guess MD FACS Signed: 03/30/2023 4:44:16 PM By: [name] RN, BSN Entered By: Duanne Guess on 03/30/2023 12:56:32 -------------------------------------------------------------------------------- SuperBill Details Patient Name: Date of Service: [name] 03/30/2023 Medical Record Number: [id number] Patient Account Number: [id number] Date of Birth/Sex: Treating RN: 1961-02-13 (61 y.o. 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 N18.31 Chronic kidney disease, stage 3a Z89.511 Acquired absence of right leg below knee Facility Procedures : CPT4 Code: [id number] Description: 03474 - WOUND CARE VISIT-LEV 2 EST PT Modifier: Quantity: 1 Physician Procedures :  CPT4 Code Description Modifier [id number] - WC PHYS LEVEL 3 - EST PT ICD-10 Diagnosis Description L97.522 Non-pressure chronic ulcer of other part of left foot with fat layer exposed E10.621 Type 1 diabetes mellitus with foot ulcer E10.43 Type 1  diabetes mellitus with diabetic autonomic (poly)neuropathy N18.31 Chronic kidney disease, stage 3a Quantity: 1 Electronic Signature(s) Signed: 03/30/2023 1:20:47 PM By: Duanne Guess MD FACS Signed: 03/30/2023 4:44:16 PM By: [name] RN, BSN Previous Signature: 03/30/2023 12:58:42 PM Version By: Duanne Guess MD FACS Entered By: [name] on 03/30/2023 13:03:55

## 2023-05-18 ENCOUNTER — Other Ambulatory Visit: Payer: Self-pay | Admitting: Family Medicine

## 2023-05-18 DIAGNOSIS — Z1231 Encounter for screening mammogram for malignant neoplasm of breast: Secondary | ICD-10-CM

## 2023-05-19 ENCOUNTER — Encounter (INDEPENDENT_AMBULATORY_CARE_PROVIDER_SITE_OTHER): Payer: Self-pay

## 2023-05-19 ENCOUNTER — Encounter (INDEPENDENT_AMBULATORY_CARE_PROVIDER_SITE_OTHER): Payer: Managed Care, Other (non HMO) | Admitting: Ophthalmology

## 2023-06-07 ENCOUNTER — Ambulatory Visit
Admission: RE | Admit: 2023-06-07 | Discharge: 2023-06-07 | Disposition: A | Discharge status: Home / Self Care | Payer: Managed Care, Other (non HMO) | Source: Ambulatory Visit | Attending: Family Medicine | Admitting: Family Medicine

## 2023-06-07 DIAGNOSIS — Z1231 Encounter for screening mammogram for malignant neoplasm of breast: Secondary | ICD-10-CM

## 2023-06-09 ENCOUNTER — Other Ambulatory Visit: Payer: Self-pay | Admitting: Family Medicine

## 2023-06-09 DIAGNOSIS — R928 Other abnormal and inconclusive findings on diagnostic imaging of breast: Secondary | ICD-10-CM

## 2023-06-15 ENCOUNTER — Other Ambulatory Visit: Payer: Self-pay | Admitting: Family Medicine

## 2023-06-15 ENCOUNTER — Ambulatory Visit
Admission: RE | Admit: 2023-06-15 | Discharge: 2023-06-15 | Disposition: A | Discharge status: Home / Self Care | Payer: Managed Care, Other (non HMO) | Source: Ambulatory Visit | Attending: Family Medicine | Admitting: Family Medicine

## 2023-06-15 DIAGNOSIS — R928 Other abnormal and inconclusive findings on diagnostic imaging of breast: Secondary | ICD-10-CM

## 2023-06-15 DIAGNOSIS — R921 Mammographic calcification found on diagnostic imaging of breast: Secondary | ICD-10-CM

## 2023-12-20 ENCOUNTER — Ambulatory Visit
Admission: RE | Admit: 2023-12-20 | Discharge: 2023-12-20 | Disposition: A | Discharge status: Home / Self Care | Payer: Managed Care, Other (non HMO) | Source: Ambulatory Visit | Attending: Family Medicine | Admitting: Family Medicine

## 2023-12-20 DIAGNOSIS — R921 Mammographic calcification found on diagnostic imaging of breast: Secondary | ICD-10-CM

## 2023-12-20 DIAGNOSIS — R928 Other abnormal and inconclusive findings on diagnostic imaging of breast: Secondary | ICD-10-CM

## 2024-11-08 ENCOUNTER — Ambulatory Visit: Admitting: Podiatry

## 2024-11-08 ENCOUNTER — Encounter: Payer: Self-pay | Admitting: Podiatry

## 2024-11-08 DIAGNOSIS — E1042 Type 1 diabetes mellitus with diabetic polyneuropathy: Secondary | ICD-10-CM

## 2024-11-08 DIAGNOSIS — M79674 Pain in right toe(s): Secondary | ICD-10-CM | POA: Diagnosis not present

## 2024-11-08 DIAGNOSIS — M79675 Pain in left toe(s): Secondary | ICD-10-CM | POA: Diagnosis not present

## 2024-11-08 DIAGNOSIS — B351 Tinea unguium: Secondary | ICD-10-CM | POA: Diagnosis not present

## 2024-11-08 NOTE — Progress Notes (Addendum)
 Subjective:  Patient ID: [name], female    DOB: 06-07-1961,   MRN: [id number]  Chief Complaint  Patient presents with   Diabetes    She needs a Diabetic foot exam.  She hasn't had one in years.  Cut her toenails.  She has a couple of bumps on the side of her foot.  Dr. Faythe was concerned about them.  Saw Dr. Faythe - 10/16/2024; A1c - 6.7    63 y.o. female presents for concern of thickened elongated and painful nails that are difficult to trim. Requesting to have them trimmed today. Relates burning and tingling in their feet. Patient is diabetic and last A1c was  Lab Results  Component Value Date   HGBA1C 7.0 (H) 05/08/2021   . History of; right BKA   PCP:  [name] Prentice SAUNDERS, [name]    . Denies any other pedal complaints. Denies n/v/f/c.   Past Medical History:  Diagnosis Date   Adhesive capsulitis 2013   Bilateral shoulders   Adhesive capsulitis of both shoulders 2013   Anemia    Cataract    Chronic kidney disease    Depression    Diabetes mellitus without complication (HCC)    Diabetic polyneuropathy (HCC)    Diabetic retinopathy (HCC) 2010   Diabetic retinopathy (HCC) 2010   Left eye, mild   Essential tremor 2011   Bilateral hands   Foot drop, bilateral 2007   Foot fracture, left    Gastroparesis 2009   GERD (gastroesophageal reflux disease)    Headache    Hemorrhage 2011   Laser surgery x2   High potassium 2012   Normal as of 2014   Hypercholesterolemia 2010   Hyperlipidemia    Neuromuscular disorder (HCC)    patient states that she has a body twitch.   Pneumonia    Postnasal drip 2010   Scratched cornea 2013   Left eye   Sleep apnea    does not wear CPAP   Stage 3 chronic kidney disease (HCC)    Thyroid disease    TMJ (temporomandibular joint disorder)    Type 1 diabetes (HCC)    Vitreous hemorrhage, right eye (HCC) 12/23/2020    Objective:  Physical Exam: Vascular: DP/PT pulses 2/4 bilateral. CFT <3 seconds. Feet cold to touch. Absent hair growth  on digits. Edema noted to bilateral lower extremities. Xerosis noted bilaterally.  Skin. No lacerations or abrasions bilateral feet. Nails 1-5 left  are thickened discolored and elongated with subungual debris.  Musculoskeletal: MMT 5/5 bilateral lower extremities in DF, PF, Inversion and Eversion. Deceased ROM in DF of ankle joint. Bka right foot.  Neurological: Sensation intact to light touch. Protective sensation diminished bilateral.    Assessment:   1. Pain due to onychomycosis of toenails of both feet   2. DM type 1 with diabetic peripheral neuropathy (HCC)      Plan:  Patient was evaluated and treated and all questions answered. -Discussed and educated patient on diabetic foot care, especially with  regards to the vascular, neurological and musculoskeletal systems.  -Stressed the importance of good glycemic control and the detriment of not  controlling glucose levels in relation to the foot. -Discussed supportive shoes at all times and checking feet regularly.  -Mechanically debrided all nails 1-5 left using sterile nail nipper and filed with dremel without incident  -Answered all patient questions -Patient to return  in 3 months for at risk foot care -Patient advised to call the office if any problems or  questions arise in the meantime.   Asberry Failing, DPM

## 2025-02-07 ENCOUNTER — Ambulatory Visit: Admitting: Podiatry
